# Patient Record
Sex: Male | Born: 1940 | Race: White | Hispanic: No | Smoking: Former smoker
Health system: Southern US, Community
[De-identification: ages and names within clinical notes are randomized; demographics above are authoritative.]

## PROBLEM LIST (undated history)

## (undated) DIAGNOSIS — F329 Major depressive disorder, single episode, unspecified: Secondary | ICD-10-CM

## (undated) DIAGNOSIS — I4891 Unspecified atrial fibrillation: Secondary | ICD-10-CM

## (undated) DIAGNOSIS — I219 Acute myocardial infarction, unspecified: Secondary | ICD-10-CM

## (undated) DIAGNOSIS — I10 Essential (primary) hypertension: Secondary | ICD-10-CM

## (undated) DIAGNOSIS — H269 Unspecified cataract: Secondary | ICD-10-CM

## (undated) DIAGNOSIS — F32A Depression, unspecified: Secondary | ICD-10-CM

## (undated) DIAGNOSIS — R7303 Prediabetes: Secondary | ICD-10-CM

## (undated) DIAGNOSIS — I2510 Atherosclerotic heart disease of native coronary artery without angina pectoris: Secondary | ICD-10-CM

## (undated) DIAGNOSIS — N400 Enlarged prostate without lower urinary tract symptoms: Secondary | ICD-10-CM

## (undated) DIAGNOSIS — G4733 Obstructive sleep apnea (adult) (pediatric): Secondary | ICD-10-CM

## (undated) DIAGNOSIS — E785 Hyperlipidemia, unspecified: Secondary | ICD-10-CM

## (undated) DIAGNOSIS — I639 Cerebral infarction, unspecified: Secondary | ICD-10-CM

## (undated) DIAGNOSIS — F419 Anxiety disorder, unspecified: Secondary | ICD-10-CM

## (undated) DIAGNOSIS — I619 Nontraumatic intracerebral hemorrhage, unspecified: Secondary | ICD-10-CM

## (undated) HISTORY — DX: Unspecified atrial fibrillation: I48.91

## (undated) HISTORY — DX: Obstructive sleep apnea (adult) (pediatric): G47.33

## (undated) HISTORY — DX: Acute myocardial infarction, unspecified: I21.9

## (undated) HISTORY — DX: Atherosclerotic heart disease of native coronary artery without angina pectoris: I25.10

## (undated) HISTORY — DX: Depression, unspecified: F32.A

## (undated) HISTORY — DX: Major depressive disorder, single episode, unspecified: F32.9

## (undated) HISTORY — DX: Essential (primary) hypertension: I10

## (undated) HISTORY — DX: Benign prostatic hyperplasia without lower urinary tract symptoms: N40.0

## (undated) HISTORY — DX: Hyperlipidemia, unspecified: E78.5

## (undated) HISTORY — DX: Anxiety disorder, unspecified: F41.9

---

## 2004-10-16 HISTORY — PX: CORONARY STENT PLACEMENT: SHX1402

## 2004-10-21 ENCOUNTER — Inpatient Hospital Stay (HOSPITAL_COMMUNITY): Admission: EM | Admit: 2004-10-21 | Discharge: 2004-10-25 | Payer: Self-pay | Admitting: Emergency Medicine

## 2004-10-21 ENCOUNTER — Encounter: Payer: Self-pay | Admitting: Emergency Medicine

## 2004-10-22 ENCOUNTER — Ambulatory Visit: Payer: Self-pay | Admitting: Cardiology

## 2004-11-10 ENCOUNTER — Ambulatory Visit: Payer: Self-pay | Admitting: *Deleted

## 2005-12-04 ENCOUNTER — Encounter (INDEPENDENT_AMBULATORY_CARE_PROVIDER_SITE_OTHER): Payer: Self-pay | Admitting: Internal Medicine

## 2005-12-04 ENCOUNTER — Ambulatory Visit: Payer: Self-pay | Admitting: *Deleted

## 2005-12-18 ENCOUNTER — Ambulatory Visit: Payer: Self-pay | Admitting: Cardiology

## 2006-01-08 ENCOUNTER — Ambulatory Visit: Payer: Self-pay | Admitting: Internal Medicine

## 2006-02-01 ENCOUNTER — Ambulatory Visit: Payer: Self-pay | Admitting: Internal Medicine

## 2006-05-30 ENCOUNTER — Encounter (INDEPENDENT_AMBULATORY_CARE_PROVIDER_SITE_OTHER): Payer: Self-pay | Admitting: Internal Medicine

## 2006-06-22 ENCOUNTER — Encounter: Payer: Self-pay | Admitting: Internal Medicine

## 2006-06-22 DIAGNOSIS — I2510 Atherosclerotic heart disease of native coronary artery without angina pectoris: Secondary | ICD-10-CM | POA: Insufficient documentation

## 2006-06-22 DIAGNOSIS — F329 Major depressive disorder, single episode, unspecified: Secondary | ICD-10-CM

## 2006-06-22 DIAGNOSIS — F411 Generalized anxiety disorder: Secondary | ICD-10-CM | POA: Insufficient documentation

## 2006-06-22 DIAGNOSIS — F32A Depression, unspecified: Secondary | ICD-10-CM | POA: Insufficient documentation

## 2006-06-22 DIAGNOSIS — N400 Enlarged prostate without lower urinary tract symptoms: Secondary | ICD-10-CM | POA: Insufficient documentation

## 2006-06-22 DIAGNOSIS — E785 Hyperlipidemia, unspecified: Secondary | ICD-10-CM | POA: Insufficient documentation

## 2006-06-22 DIAGNOSIS — I10 Essential (primary) hypertension: Secondary | ICD-10-CM | POA: Insufficient documentation

## 2006-08-27 ENCOUNTER — Encounter (INDEPENDENT_AMBULATORY_CARE_PROVIDER_SITE_OTHER): Payer: Self-pay | Admitting: Internal Medicine

## 2006-08-31 LAB — CONVERTED CEMR LAB
AST: 12 units/L (ref 0–37)
Alkaline Phosphatase: 81 units/L (ref 39–117)
BUN: 22 mg/dL (ref 6–23)
Calcium: 8.9 mg/dL (ref 8.4–10.5)
Chloride: 112 meq/L (ref 96–112)
Potassium: 4.4 meq/L (ref 3.5–5.3)
Sodium: 144 meq/L (ref 135–145)
TSH: 1.76 microintl units/mL (ref 0.350–5.50)
Total Bilirubin: 0.4 mg/dL (ref 0.3–1.2)
Total Protein: 6.5 g/dL (ref 6.0–8.3)
Triglycerides: 95 mg/dL (ref <150)
VLDL: 19 mg/dL (ref 0–40)

## 2006-09-01 ENCOUNTER — Ambulatory Visit: Payer: Self-pay | Admitting: Internal Medicine

## 2006-09-01 DIAGNOSIS — I252 Old myocardial infarction: Secondary | ICD-10-CM | POA: Insufficient documentation

## 2006-09-01 DIAGNOSIS — R5381 Other malaise: Secondary | ICD-10-CM | POA: Insufficient documentation

## 2006-09-01 DIAGNOSIS — R5383 Other fatigue: Secondary | ICD-10-CM

## 2006-09-02 ENCOUNTER — Ambulatory Visit: Payer: Self-pay | Admitting: Internal Medicine

## 2006-09-14 ENCOUNTER — Encounter (INDEPENDENT_AMBULATORY_CARE_PROVIDER_SITE_OTHER): Payer: Self-pay | Admitting: Internal Medicine

## 2006-09-16 ENCOUNTER — Encounter (INDEPENDENT_AMBULATORY_CARE_PROVIDER_SITE_OTHER): Payer: Self-pay | Admitting: Internal Medicine

## 2006-09-22 ENCOUNTER — Ambulatory Visit: Payer: Self-pay | Admitting: Internal Medicine

## 2006-09-22 DIAGNOSIS — G4733 Obstructive sleep apnea (adult) (pediatric): Secondary | ICD-10-CM | POA: Insufficient documentation

## 2006-10-27 ENCOUNTER — Encounter (INDEPENDENT_AMBULATORY_CARE_PROVIDER_SITE_OTHER): Payer: Self-pay | Admitting: Internal Medicine

## 2007-02-07 ENCOUNTER — Encounter (INDEPENDENT_AMBULATORY_CARE_PROVIDER_SITE_OTHER): Payer: Self-pay | Admitting: Internal Medicine

## 2007-03-17 ENCOUNTER — Ambulatory Visit: Payer: Self-pay | Admitting: Internal Medicine

## 2007-03-17 DIAGNOSIS — R635 Abnormal weight gain: Secondary | ICD-10-CM | POA: Insufficient documentation

## 2007-03-17 DIAGNOSIS — D485 Neoplasm of uncertain behavior of skin: Secondary | ICD-10-CM | POA: Insufficient documentation

## 2007-03-17 LAB — CONVERTED CEMR LAB: OCCULT 1: NEGATIVE

## 2007-03-31 ENCOUNTER — Ambulatory Visit: Payer: Self-pay | Admitting: Internal Medicine

## 2007-04-06 ENCOUNTER — Telehealth (INDEPENDENT_AMBULATORY_CARE_PROVIDER_SITE_OTHER): Payer: Self-pay | Admitting: Internal Medicine

## 2007-04-06 ENCOUNTER — Encounter (INDEPENDENT_AMBULATORY_CARE_PROVIDER_SITE_OTHER): Payer: Self-pay | Admitting: Internal Medicine

## 2007-05-09 ENCOUNTER — Encounter (INDEPENDENT_AMBULATORY_CARE_PROVIDER_SITE_OTHER): Payer: Self-pay | Admitting: Internal Medicine

## 2007-05-10 ENCOUNTER — Encounter (INDEPENDENT_AMBULATORY_CARE_PROVIDER_SITE_OTHER): Payer: Self-pay | Admitting: Internal Medicine

## 2007-05-11 ENCOUNTER — Encounter (INDEPENDENT_AMBULATORY_CARE_PROVIDER_SITE_OTHER): Payer: Self-pay | Admitting: Internal Medicine

## 2007-05-11 ENCOUNTER — Telehealth (INDEPENDENT_AMBULATORY_CARE_PROVIDER_SITE_OTHER): Payer: Self-pay | Admitting: *Deleted

## 2007-05-11 LAB — CONVERTED CEMR LAB
AST: 7 units/L (ref 0–37)
Albumin: 4.3 g/dL (ref 3.5–5.2)
Alkaline Phosphatase: 79 units/L (ref 39–117)
Creatinine, Ser: 1.32 mg/dL (ref 0.40–1.50)
HDL: 36 mg/dL — ABNORMAL LOW (ref >39)
PSA: 1.86 ng/mL (ref 0.10–4.00)
TSH: 2.732 microintl units/mL (ref 0.350–5.50)
Triglycerides: 166 mg/dL — ABNORMAL HIGH (ref <150)
VLDL: 33 mg/dL (ref 0–40)

## 2007-06-15 ENCOUNTER — Encounter (INDEPENDENT_AMBULATORY_CARE_PROVIDER_SITE_OTHER): Payer: Self-pay | Admitting: Internal Medicine

## 2007-07-05 ENCOUNTER — Encounter (INDEPENDENT_AMBULATORY_CARE_PROVIDER_SITE_OTHER): Payer: Self-pay | Admitting: Internal Medicine

## 2007-08-12 ENCOUNTER — Encounter (INDEPENDENT_AMBULATORY_CARE_PROVIDER_SITE_OTHER): Payer: Self-pay | Admitting: Internal Medicine

## 2007-08-22 ENCOUNTER — Ambulatory Visit: Payer: Self-pay | Admitting: Internal Medicine

## 2007-08-22 DIAGNOSIS — R1011 Right upper quadrant pain: Secondary | ICD-10-CM | POA: Insufficient documentation

## 2007-08-22 LAB — CONVERTED CEMR LAB
Blood in Urine, dipstick: NEGATIVE
Glucose, Urine, Semiquant: NEGATIVE
Ketones, urine, test strip: NEGATIVE
Specific Gravity, Urine: 1.02

## 2007-08-23 LAB — CONVERTED CEMR LAB
ALT: 20 units/L (ref 0–53)
AST: 17 units/L (ref 0–37)
Albumin: 4.5 g/dL (ref 3.5–5.2)
Basophils Absolute: 0 10*3/uL (ref 0.0–0.1)
CO2: 22 meq/L (ref 19–32)
Calcium: 9.1 mg/dL (ref 8.4–10.5)
Creatinine, Ser: 1.26 mg/dL (ref 0.40–1.50)
Eosinophils Absolute: 0.3 10*3/uL (ref 0.0–0.7)
HCT: 47 % (ref 39.0–52.0)
HDL: 29 mg/dL — ABNORMAL LOW (ref >39)
Hemoglobin: 15.6 g/dL (ref 13.0–17.0)
Lymphocytes Relative: 41 % (ref 12–46)
MCHC: 33.2 g/dL (ref 30.0–36.0)
MCV: 90.9 fL (ref 78.0–100.0)
RDW: 14.4 % (ref 11.5–15.5)
Sodium: 141 meq/L (ref 135–145)
Total Bilirubin: 0.4 mg/dL (ref 0.3–1.2)
Triglycerides: 187 mg/dL — ABNORMAL HIGH (ref <150)
VLDL: 37 mg/dL (ref 0–40)
WBC: 7.9 10*3/uL (ref 4.0–10.5)

## 2007-10-12 ENCOUNTER — Ambulatory Visit: Payer: Self-pay | Admitting: Internal Medicine

## 2007-10-13 ENCOUNTER — Encounter (INDEPENDENT_AMBULATORY_CARE_PROVIDER_SITE_OTHER): Payer: Self-pay | Admitting: Internal Medicine

## 2007-11-11 ENCOUNTER — Encounter (INDEPENDENT_AMBULATORY_CARE_PROVIDER_SITE_OTHER): Payer: Self-pay | Admitting: Internal Medicine

## 2007-11-11 ENCOUNTER — Ambulatory Visit: Admission: RE | Admit: 2007-11-11 | Discharge: 2007-11-11 | Payer: Self-pay | Admitting: Internal Medicine

## 2007-11-22 ENCOUNTER — Telehealth (INDEPENDENT_AMBULATORY_CARE_PROVIDER_SITE_OTHER): Payer: Self-pay | Admitting: *Deleted

## 2007-11-30 ENCOUNTER — Ambulatory Visit: Payer: Self-pay | Admitting: Pulmonary Disease

## 2007-12-05 ENCOUNTER — Encounter (INDEPENDENT_AMBULATORY_CARE_PROVIDER_SITE_OTHER): Payer: Self-pay | Admitting: Internal Medicine

## 2007-12-09 ENCOUNTER — Encounter (INDEPENDENT_AMBULATORY_CARE_PROVIDER_SITE_OTHER): Payer: Self-pay | Admitting: Internal Medicine

## 2007-12-14 ENCOUNTER — Encounter (INDEPENDENT_AMBULATORY_CARE_PROVIDER_SITE_OTHER): Payer: Self-pay | Admitting: Internal Medicine

## 2008-03-09 ENCOUNTER — Ambulatory Visit: Payer: Self-pay | Admitting: Internal Medicine

## 2008-03-09 DIAGNOSIS — H5789 Other specified disorders of eye and adnexa: Secondary | ICD-10-CM | POA: Insufficient documentation

## 2008-04-10 ENCOUNTER — Ambulatory Visit: Payer: Self-pay | Admitting: Internal Medicine

## 2008-04-10 DIAGNOSIS — F17200 Nicotine dependence, unspecified, uncomplicated: Secondary | ICD-10-CM | POA: Insufficient documentation

## 2008-04-17 ENCOUNTER — Encounter (INDEPENDENT_AMBULATORY_CARE_PROVIDER_SITE_OTHER): Payer: Self-pay | Admitting: Internal Medicine

## 2008-04-19 LAB — CONVERTED CEMR LAB
ALT: 12 units/L (ref 0–53)
Calcium: 9.2 mg/dL (ref 8.4–10.5)
Chloride: 108 meq/L (ref 96–112)
Cholesterol: 146 mg/dL (ref 0–200)
Creatinine, Ser: 1.23 mg/dL (ref 0.40–1.50)
HDL: 34 mg/dL — ABNORMAL LOW (ref >39)
Sodium: 141 meq/L (ref 135–145)
Total Bilirubin: 0.6 mg/dL (ref 0.3–1.2)
Total CHOL/HDL Ratio: 4.3
Triglycerides: 186 mg/dL — ABNORMAL HIGH (ref <150)
VLDL: 37 mg/dL (ref 0–40)

## 2008-05-03 ENCOUNTER — Encounter (INDEPENDENT_AMBULATORY_CARE_PROVIDER_SITE_OTHER): Payer: Self-pay | Admitting: Internal Medicine

## 2008-06-05 ENCOUNTER — Encounter (INDEPENDENT_AMBULATORY_CARE_PROVIDER_SITE_OTHER): Payer: Self-pay | Admitting: Internal Medicine

## 2008-12-31 ENCOUNTER — Ambulatory Visit: Payer: Self-pay | Admitting: Internal Medicine

## 2008-12-31 DIAGNOSIS — M722 Plantar fascial fibromatosis: Secondary | ICD-10-CM | POA: Insufficient documentation

## 2009-01-01 LAB — CONVERTED CEMR LAB
ALT: 17 units/L (ref 0–53)
Albumin: 4.3 g/dL (ref 3.5–5.2)
BUN: 20 mg/dL (ref 6–23)
Calcium: 8.7 mg/dL (ref 8.4–10.5)
Creatinine, Ser: 1.15 mg/dL (ref 0.40–1.50)
Glucose, Bld: 106 mg/dL — ABNORMAL HIGH (ref 70–99)
HDL: 29 mg/dL — ABNORMAL LOW (ref >39)
VLDL: 63 mg/dL — ABNORMAL HIGH (ref 0–40)

## 2009-02-06 ENCOUNTER — Encounter (INDEPENDENT_AMBULATORY_CARE_PROVIDER_SITE_OTHER): Payer: Self-pay | Admitting: Internal Medicine

## 2009-08-23 ENCOUNTER — Ambulatory Visit: Payer: Self-pay | Admitting: Cardiovascular Disease

## 2009-08-23 DIAGNOSIS — I4891 Unspecified atrial fibrillation: Secondary | ICD-10-CM | POA: Insufficient documentation

## 2010-04-24 ENCOUNTER — Encounter (INDEPENDENT_AMBULATORY_CARE_PROVIDER_SITE_OTHER): Payer: Self-pay | Admitting: *Deleted

## 2010-06-08 ENCOUNTER — Encounter: Payer: Self-pay | Admitting: Cardiovascular Disease

## 2010-06-17 NOTE — Assessment & Plan Note (Signed)
Summary: **AFIB   Visit Type:  Initial Consult Primary Provider:  Dr.Tom [name]  CC:  AFIB.  History of Present Illness: Eugene [name] is a new patient to me.  He has had a previous stent to the PDA ostium in 2006.  He last saw Dr Dorethea Clan in 2007.  He has CRF's of elevated cholesterol HTN and ongoing tobacco abuse.  He was counseled for less than 10 minutes in regard to smoking cessaton.  He is not motivated to quit and smokes about a pack/day.  He had an exam with Dr Rubin Payor and was noted to be in asymptoatic afib.  He denies palpitations, SSCP, syncope or edema.  He has mild exertional dyspnea from emphysema.  Dr Rubin Payor stopped his Plavix and started on him on coumadin.  I spoke to the patient at length about the diagnosis.  Since he is asymptomatic I think rate control and anticoagulation is warrented.  He has known CAD with ongoing smoking and Plavix has been stopped.  I think an exercise myovue is in order to test the adequacy of rate control and R/O recurrent ischemia or CAD.  We will also check an echo to measure atrial size and make sure LV funciton is still normal.  I did discuss Pradaxa with the patient but we both were a little leary of the fact that it cannot be reversed.  Current Problems (verified): 1)  Atrial Fibrillation  (ICD-427.31) 2)  Plantar Fasciitis, Left  (ICD-728.71) 3)  Tobacco Abuse  (ICD-305.1) 4)  Redness or Discharge of Eye  (ICD-379.93) 5)  Ruq Pain  (ICD-789.01) 6)  Encounter For Long-term Use of Other Medications  (ICD-V58.69) 7)  Screening For Malignant Neoplasm, Colon  (ICD-V76.51) 8)  Neoplasm, Skin, Uncertain Behavior  (ICD-238.2) 9)  Screening For Malignant Neoplasm, Prostate  (ICD-V76.44) 10)  Weight Gain  (ICD-783.1) 11)  Sleep Apnea, Obstructive  (ICD-327.23) 12)  Fatigue  (ICD-780.79) 13)  Myocardial Infarction, Hx of  (ICD-412) 14)  Benign Prostatic Hypertrophy  (ICD-600.00) 15)  Hypertension  (ICD-401.9) 16)  Hyperlipidemia  (ICD-272.4) 17)   Depression  (ICD-311) 18)  Coronary Artery Disease  (ICD-414.00) 19)  Anxiety  (ICD-300.00)  Current Medications (verified): 1)  Metoprolol Tartrate 25 Mg Tabs (Metoprolol Tartrate) .... 1/2 By Mouth Two Times A Day 2)  Aspirin 81 Mg Tbec (Aspirin) .Marland Kitchen.. 1 By Mouth Once Daily 3)  Amlodipine Besylate 10 Mg Tabs (Amlodipine Besylate) .Marland Kitchen.. 1 By Mouth Once Daily 4)  Paxil 20 Mg Tabs (Paroxetine Hcl) .... Once Daily 5)  Simvastatin 40 Mg  Tabs (Simvastatin) .Marland Kitchen.. 1 By Mouth Once Daily 6)  Fish Oil Concentrate 1000 Mg Caps (Omega-3 Fatty Acids) .Marland Kitchen.. 1 By Mouth Two Times A Day 7)  Benazepril Hcl 40 Mg Tabs (Benazepril Hcl) .Marland Kitchen.. 1 By Mouth Once Daily  Allergies (verified): No Known Drug Allergies  Past History:  Past Medical History: Last updated: 09/22/2006 Anxiety Coronary artery disease Depression Hyperlipidemia Hypertension Benign prostatic hypertrophy Myocardial infarction, hx of-6/06-normal LV function OSA  Past Surgical History: Last updated: 2006/09/16 cardiac stent-6/06  Family History: Last updated: 2006-09-16 father-deceased mother-deceased sister-70 brother-74 brother-72 son-44-OSA daughter-43  Social History: Last updated: 08/23/2009 Divorced lives alone Current Smoker-1 ppd x 50 years Alcohol use-no Drug use-no Retired Insurance claims handler for [employer]  Social History: Divorced lives alone Current Smoker-1 ppd x 50 years Alcohol use-no Drug use-no Retired Insurance claims handler for [employer]  Review of Systems       Denies fever, malais, weight loss, blurry vision, decreased visual acuity, cough,  sputum, SOB, hemoptysis, pleuritic pain, palpitaitons, heartburn, abdominal pain, melena, lower extremity edema, claudication, or rash. All other systems reviewed and negative  Vital Signs:  Patient profile:   70 year old male Height:      73 inches Weight:      209 pounds BMI:     27.67 Pulse rate:   65 / minute BP sitting:   124 / 77  (right arm)  Vitals Entered  By: Dreama Saa, CNA (August 23, 2009 10:08 AM)  Physical Exam  General:  Affect appropriate Healthy:  appears stated age HEENT: normal Neck supple with no adenopathy JVP normal no bruits no thyromegaly Lungs end exp  wheezing and good diaphragmatic motion Heart:  S1/S2 no murmur,rub, gallop or click PMI normal Abdomen: benighn, BS positve, no tenderness, no AAA no bruit.  No HSM or HJR Distal pulses intact with no bruits No edema Neuro non-focal Skin warm and dry    Impression & Recommendations:  Problem # 1:  ATRIAL FIBRILLATION (ICD-427.31)  Rate control good, INR followed in [place] Echo to assess atrial size and EF The following medications were removed from the medication list:    Plavix 75 Mg Tabs (Clopidogrel bisulfate) ..... Once daily His updated medication list for this problem includes:    Metoprolol Tartrate 25 Mg Tabs (Metoprolol tartrate) .Marland Kitchen... 1/2 by mouth two times a day    Aspirin 81 Mg Tbec (Aspirin) .Marland Kitchen... 1 by mouth once daily  Orders: Nuclear Stress Test (Nuc Stress Test) 2-D Echocardiogram (2D Echo)  The following medications were removed from the medication list:    Plavix 75 Mg Tabs (Clopidogrel bisulfate) ..... Once daily His updated medication list for this problem includes:    Metoprolol Tartrate 25 Mg Tabs (Metoprolol tartrate) .Marland Kitchen... 1/2 by mouth two times a day    Aspirin 81 Mg Tbec (Aspirin) .Marland Kitchen... 1 by mouth once daily  Problem # 2:  TOBACCO ABUSE (ICD-305.1) Will call if he wants a script for Chantix  Problem # 3:  MYOCARDIAL INFARCTION, HX OF (ICD-412)  CAD with history of PDA stent.  Myovue The following medications were removed from the medication list:    Plavix 75 Mg Tabs (Clopidogrel bisulfate) ..... Once daily His updated medication list for this problem includes:    Metoprolol Tartrate 25 Mg Tabs (Metoprolol tartrate) .Marland Kitchen... 1/2 by mouth two times a day    Aspirin 81 Mg Tbec (Aspirin) .Marland Kitchen... 1 by mouth once daily     Amlodipine Besylate 10 Mg Tabs (Amlodipine besylate) .Marland Kitchen... 1 by mouth once daily    Benazepril Hcl 40 Mg Tabs (Benazepril hcl) .Marland Kitchen... 1 by mouth once daily  The following medications were removed from the medication list:    Plavix 75 Mg Tabs (Clopidogrel bisulfate) ..... Once daily His updated medication list for this problem includes:    Metoprolol Tartrate 25 Mg Tabs (Metoprolol tartrate) .Marland Kitchen... 1/2 by mouth two times a day    Aspirin 81 Mg Tbec (Aspirin) .Marland Kitchen... 1 by mouth once daily    Amlodipine Besylate 10 Mg Tabs (Amlodipine besylate) .Marland Kitchen... 1 by mouth once daily    Benazepril Hcl 40 Mg Tabs (Benazepril hcl) .Marland Kitchen... 1 by mouth once daily  Problem # 4:  HYPERTENSION (ICD-401.9)  Well controlled His updated medication list for this problem includes:    Metoprolol Tartrate 25 Mg Tabs (Metoprolol tartrate) .Marland Kitchen... 1/2 by mouth two times a day    Aspirin 81 Mg Tbec (Aspirin) .Marland Kitchen... 1 by mouth once daily  Amlodipine Besylate 10 Mg Tabs (Amlodipine besylate) .Marland Kitchen... 1 by mouth once daily    Benazepril Hcl 40 Mg Tabs (Benazepril hcl) .Marland Kitchen... 1 by mouth once daily  His updated medication list for this problem includes:    Metoprolol Tartrate 25 Mg Tabs (Metoprolol tartrate) .Marland Kitchen... 1/2 by mouth two times a day    Aspirin 81 Mg Tbec (Aspirin) .Marland Kitchen... 1 by mouth once daily    Amlodipine Besylate 10 Mg Tabs (Amlodipine besylate) .Marland Kitchen... 1 by mouth once daily    Benazepril Hcl 40 Mg Tabs (Benazepril hcl) .Marland Kitchen... 1 by mouth once daily  Problem # 5:  HYPERLIPIDEMIA (ICD-272.4)  Continue Statin His updated medication list for this problem includes:    Simvastatin 40 Mg Tabs (Simvastatin) .Marland Kitchen... 1 by mouth once daily  CHOL: 119 (12/31/2008)   LDL: 27 (12/31/2008)   HDL: 29 (12/31/2008)   TG: 316 (12/31/2008)  His updated medication list for this problem includes:    Simvastatin 40 Mg Tabs (Simvastatin) .Marland Kitchen... 1 by mouth once daily  Patient Instructions: 1)  Your physician recommends that you schedule a  follow-up appointment in: 6 months 2)  Your physician recommends that you continue on your current medications as directed. Please refer to the Current Medication list given to you today. 3)  Your physician has requested that you have an echocardiogram.  Echocardiography is a painless test that uses sound waves to create images of your heart. It provides your doctor with information about the size and shape of your heart and how well your heart's chambers and valves are working.  This procedure takes approximately one hour. There are no restrictions for this procedure. 4)  Your physician has requested that you have an exercise stress myoview.  For further information please visit [email or web address]  Please follow instruction sheet, as given.   EKG Report  Procedure date:  08/23/2009  Findings:      AFib 52 LAD PVC Poor R wave progression

## 2010-06-17 NOTE — Letter (Signed)
Summary: [place] [facility] (Nuc Med Stress)  [facility]  [address], [place] [address]   Phone: [phone]  Fax: [phone]    Nuclear Medicine 1-Day Stress Test Information Sheet  Re:     Eugene [name]   DOB:     [birth year] MRN:     [id number] Weight:  Appointment Date: Register at: Appointment Time: Referring MD:  _X__Exercise Stress  __Adenosine   __Dobutamine  __Lexiscan  __Persantine   __Thallium  Urgency: ____1 (next day)   ____2 (one week)    ____3 (PRN)  Patient will receive Follow Up call with results: Patient needs follow-up appointment:  Instructions regarding medication:  How to prepare for your stress test: 1. DO NOT eat or dring 6 hours prior to your arrival time. This includes no caffeine (coffee, tea, sodas, chocolate) if you were instructed to take your medications, drink water with it. 2. DO NOT use any tobacco products for at leaset 8 hours prior to arrival. 3. DO NOT wear dresses or any clothing that may have metal clasps or buttons. 4. Wear short sleeve shirts, loose clothing, and comfortalbe walking shoes. 5. DO NOT use lotions, oils or powder on your chest before the test. 6. The test will take approximately 3-4 hours from the time you arrive until completion. 7. To register the day of the test, go to the Short Stay entrance at [facility]. 8. If you must cancel your test, call [phone] as soon as you are aware. 9. Do not take Metoprolol dose the morning of test. After you arrive for test:   When you arrive at [facility], you will go to Short Stay to be registered. They will then send you to Radiology to check in. The Nuclear Medicine Tech will get you and start an IV in your arm or hand. A small amount of a radioactive tracer will then be injected into your IV. This tracer will then have to circulate for 30-45 minutes. During this time you will wait in the waiting room and you will be able to drink  something without caffeine. A series of pictures will be taken of your heart follwoing this waiting period. After the 1st set of pictures you will go to the stress lab to get ready for your stress test. During the stress test, another small amount of a radioactive tracer will be injected through your IV. When the stress test is complete, there is a short rest period while your heart rate and blood pressure will be monitored. When this monitoring period is complete you will have another set of pictrues taken. (The same as the 1st set of pictures). These pictures are taken between 15 minutes and 1 hour after the stress test. The time depends on the type of stress test you had. Your doctor will inform you of your test results within 7 days after test.    The possibilities of certain changes are possible during the test. They include abnormal blood pressure and disorders of the heart. Side effects of persantine or adenosine can include flushing, chest pain, shortness of breath, stomach tightness, headache and light-headedness. These side effects usually do not last long and are self-resolving. Every effort will be made to keep you comfortable and to minimize complications by obtaining a medical history and by close observation during the test. Emergency equipment, medications, and trained personnel are available to deal with any unusual situation which may arise.  Please notify office at least 48  hours in advance if you are unable to keep this appt.

## 2010-06-17 NOTE — Letter (Signed)
Summary: Appointment - Reminder 2  [facility] [place]. [address] [place] [address]   Phone: [phone]  Fax: [phone]     April 24, 2010 MRN: [id number]   [facility] [address] [place], [place]  [address]   Dear Eugene [name],  Our records indicate that it is time to schedule a follow-up appointment.  Dr.   Eden Emms       recommended that you follow up with [place] in   02/2010 PAST DUE         . It is very important that we reach you to schedule this appointment. We look forward to participating in your health care needs. Please contact us at the number listed above at your earliest convenience to schedule your appointment.  If you are unable to make an appointment at this time, give [place] a call so we can update our records.     Sincerely,   Glass blower/designer

## 2010-09-30 NOTE — Procedures (Signed)
Eugene [name], Eugene [name]           ACCOUNT NO.:  [id number]   MEDICAL RECORD NO.:  [id number]          PATIENT TYPE:  OUT   LOCATION:  SLEEP LAB                     FACILITY:  [facility]   PHYSICIAN:  Barbaraann Share, MD,FCCPDATE OF BIRTH:  Jan 04[birth year] 1939   DATE OF STUDY:  11/11/2007                            NOCTURNAL POLYSOMNOGRAM   REFERRING PHYSICIAN:  Erle Crocker, M.D.   REFERRING PHYSICIAN:  Dr. Erle Crocker.   INDICATIONS FOR THE STUDY:  Is hypersomnia with sleep apnea.   EPWORTH SCORE:  13.   SLEEP ARCHITECTURE:  The patient had a total sleep time of 125 minutes  during the diagnostic portion of the study and 262 minutes during the  titration portion.  He was found to have very little slow wave sleep and  REM during the entire night.  Sleep onset latency was normal at 27  minutes, and sleep efficiency was mild to moderately reduced overall.   RESPIRATORY DATA:  The patient underwent split-night study where he was  found to have 45 obstructive events in the first 125 minutes of sleep.  This gave him an AHI of 22 events per hour during the diagnostic portion  of the study.  Events occurred in all body positions, and there was very  loud snoring noted throughout.  By protocol, the patient was then placed  on a large ComfortGel full face mask and ultimately titrated to a final  pressure of 14 cm of water.  The patient was well-controlled on this  pressure until the very last REM period, and then he was found to have  some breakthrough events.   OXYGEN DATA:  The patient had O2 desaturation as low as 91% with his  obstructive events.   CARDIAC DATA:  No clinically significant arrhythmias were noted.   MOVEMENT/PARASOMNIA:  There were no significant leg jerks or abnormal  behaviors.   IMPRESSION/RECOMMENDATIONS:  Split-night study reveals moderate  obstructive sleep apnea with an AHI during the diagnostic portion of 22  events per hour and O2 desaturation as low as 91%.  The  patient was then  placed  on CPAP with a large ComfortGel full face mask, and ultimately titrated  to a final pressure of 14 cm.  There was a few breakthrough events  during the last REM period, but overall the patient appeared to have  adequate control.      Barbaraann Share, MD,FCCP  Diplomate, American Board of Sleep  Medicine  Electronically Signed     [facility]/MEDQ  D:  11/30/2007 20:21:13  T:  11/30/2007 20:59:20  Job:  [id number]

## 2010-10-03 NOTE — Assessment & Plan Note (Signed)
[facility]                         [facility] OFFICE NOTE   NAME:Eugene [name], Eugene [name]                    MRN:          [id number]  DATE:12/04/2005                            DOB:          [birth year]    Primary used to be Dr. Sudie Bailey.  He does not have one now.   Eugene [name] is a man we follow for his coronary disease, status post a  Cypher stent in his PDA.  He has nonobstructive disease in other coronary  distributions.  Normal LV systolic function.  Hypertension, hyperlipidemia,  and a history of tobacco abuse.  He comes in to see me today for routine  followup.  He is hypertensive.  He says his blood pressure has been out of  control for a while.  No other symptoms.  No other problems.   PHYSICAL EXAMINATION:  VITAL SIGNS:  He is 185 pounds.  His blood pressure  is 180/100.  His pulse is 74.  CHEST:  Clear.  NECK:  He has no jugular venous distention or carotid bruits.  CARDIOVASCULAR:  Regular.  There is an S4 but no murmur.  EXTREMITIES:  Lower extremities without edema.   MEDICATIONS:  1.  Aspirin 325 once a day.  2.  Plavix 75 once a day.  3.  Lopressor 12.5 mg twice a day.  4.  Lipitor 20 once a day.  5.  Enalapril 5 twice a day.   My plan is to increase his enalapril to 10 twice a day to see how he does.  We will see him back in two weeks for a blood pressure check.  He is going  to need to have his lipids checked and will get that set up for him as a  fasting set of lipids.  We will see him back in six months for routine  followup.                                   Farris Has. Dorethea Clan, MD   J[facility]/MedQ  DD:  12/04/2005  DT:  12/04/2005  Job #:  [id number]

## 2010-10-03 NOTE — Discharge Summary (Signed)
Eugene [name], Eugene [name] NO.:  [id number]   MEDICAL RECORD NO.:  [id number]           PATIENT TYPE:   LOCATION:  3711                         FACILITY:  [facility]   PHYSICIAN:  Eugene [name], M.D. LHCDATE OF BIRTH:  [birth year]   DATE OF ADMISSION:  10/21/2004  DATE OF DISCHARGE:  10/25/2004                                 DISCHARGE SUMMARY   PRIMARY CARE PHYSICIAN:  Eugene Homer. Sudie Bailey, MD, in [place], [place]  [place].   CARDIOLOGIST:  Eugene Roller, MD.   DISCHARGING DIAGNOSES:  1.  Non-ST elevated myocardial infarction, status post drug-eluting stent to      the posterior descending artery on October 22, 2004.  Medical therapy      planned otherwise.  Left ventricular ejection around 55%.  2.  Hyperlipidemia, statin initiated.  3.  Hypertension.  4.  History of tobacco abuse.   DISPOSITION:   DISCHARGE MEDICATIONS:  1.  Aspirin 325 mg daily.  2.  Plavix 75 mg daily.  3.  Vasotec 5 mg p.o. b.i.d.  4.  Lopressor 12.5 mg p.o. b.i.d.  5.  Lipitor 20 mg at bedtime.  6.  Nitroglycerin for chest discomfort.   ACTIVITY:  No driving or lifting over 10 pounds x1 week.   DIET:  Low-fat, low-salt, low-cholesterol.   SPECIAL INSTRUCTIONS:  1.  No tub bathing x2 days.  2.  Patient is highly encouraged on cessation of smoking.  3.  He is to call our office at [phone] for any fever or for any pain or      swelling from cath site.   FOLLOWUP:  He has a followup appointment with Dr. Dorethea [name] in two weeks.  He  will need lipids and LFT checked in six to eight weeks with initiation of  statin.   HOSPITAL COURSE:  Mr. Eugene [name] is a 70 year old gentleman initially seen  at [facility] for chest pain and transported to [facility] for further  workup.  EKG with ST depression in anterolateral leads, a troponin of 9.98.  Patient to the cath lab.  Results as stated above.  Patient tolerated the  procedure without complication.  Post-cath, the patient developed an  episode  of chest pain, and EKG done without changes.  Discomfort relieved with  nitroglycerin and morphine and resolved.  Patient without any further chest  discomfort, up ambulating with cardiac rehab.  Drs.  Eugene [name] and Eugene [name] in to see patient on day of discharge.  Vital signs  stable.  Hemoglobin 13.8, BUN and creatinine 19 and 1.1.  Patient being  discharged home to follow up and will follow up with Dr. Dorethea [name] as stated  above.      Mic   MB/MEDQ  D:  10/25/2004  T:  10/25/2004  Job:  [id number]   cc:   Eugene [name], M.D.  [address] [place], [place] [address]  Fax: [phone]

## 2010-10-03 NOTE — H&P (Signed)
NAMETERESO, UNANGST NO.:  [id number]   MEDICAL RECORD NO.:  [id number]          PATIENT TYPE:  INP   LOCATION:  1825                         FACILITY:  [facility]   PHYSICIAN:  Vida Roller, M.D.   DATE OF BIRTH:  July 26, 1940   DATE OF ADMISSION:  10/21/2004  DATE OF DISCHARGE:                                HISTORY & PHYSICAL   PRIMARY CARE PHYSICIAN:  Mila Homer. Sudie Bailey, M.D.   HISTORY OF PRESENT ILLNESS:  Mr. Eugene [name] is a 70 year old man with a past  medical history of hypertension, hyperlipidemia and active tobacco abuse who  presents to the emergency department and [facility] after an  episode of chest pain which started at 8:30 this morning. He states that the  discomforts in the center of his chest, radiates to his left arm associated  with some shortness of breath, occurred at rest, has no exertional component  to it. Pain waxed and waned. He had an initial electrocardiogram that showed  ST-segment depression in anterolateral leads. His initial troponins were  abnormal and so he was treated with IV heparin, nitroglycerin, and aspirin  and sent to [facility]. His discomfort had resolved on IV  nitroglycerin, but now recurs here in the ER at [facility], but is significantly better with a single sublingual nitroglycerin  and has nearly resolved.   PAST MEDICAL HISTORY:  1.  Significant for hypertension. He is on Vaseretic at an unknown dose.  2.  He also has hyperlipidemia which is untreated.  3.  He has a history of cataracts.  4.  He is also an active smoker has about 75 to 80+ pack-year smoking      history. Continues smoke about a pack a day. Denies illicit drugs or      alcohol.   FAMILY HISTORY:  His mother and father both passed away at an advanced  stage; both of them complications of hypertension. Neither one had coronary  artery disease. She has siblings without coronary disease. He has had two  children who have no coronary disease and several grandchildren.   PAST SURGICAL HISTORY:  Significant only for the cataracts and had cataract  surgery number of years ago without complications.   REVIEW OF SYSTEMS:  Denies any headaches, visual changes, sinus tenderness,  sinus discharge, changes in hearing, odynophagia, dysphasia, shortness of  breath, wheezing, cough productive of sputum. He denies any abdominal pain,  melena, hematochezia, hematemesis. Denies any PND, orthopnea, or lower  extremity edema. Denies any musculoskeletal problems. Denies any neurologic  problems. Denies any weight loss or weight gain. No heat or cold  intolerances. He denies any skin rashes. The remainder of his review of  systems is negative.   PHYSICAL EXAMINATION:  GENERAL:  He is a well-developed, well-nourished  white male in no apparent distress who is alert and oriented x4.  VITAL SIGNS:  His heart rate 55 and regular, blood pressure of 140/60,  respirations were 18. He is afebrile.  HEENT:  Examination of the head, ears, eyes, nose and throat unremarkable.  NECK:  Neck is supple. There  is no jugular venous distension or carotid  bruits.  CHEST:  Clear to auscultation bilaterally.  CARDIOVASCULAR:  His cardiac exam is regular. He does have an S4. There is  no obvious murmurs. First and second heart sounds are normal.  ABDOMEN:  His abdomen is soft, nontender. Normoactive bowel sounds.  EXTREMITIES:  Lower extremities without clubbing, cyanosis or edema. Pulses  were all 2+.  GENITOURINARY:  Deferred.  RECTAL:  Deferred.  BREASTS:  Deferred.  NEUROLOGICAL:  Nonfocal.  MUSCULOSKELETAL:  Nonfocal.   LABORATORY DATA:  His electrocardiogram shows sinus rhythm at a rate of 65  with normal intervals and normal axes. He has about 1/2 mm ST-segment  depression in V4, V5 and V6 with biphasic T-waves in V5 and V6. I have no  old EKGs for comparison.   White blood cell count 8.7, H&H of 16 and 47,  platelet count of 242,000.  Sodium 137, potassium 3.9, chloride 104, bicarbonate 27, BUN 18, creatinine  1.1, blood glucose of 140. His liver function studies are within normal  limits. Initial set of cardiac enzymes point care shows CK-MB of 2.7 which  is within normal range but a troponin of 0.6. Myoglobin was normal at 80.  Second set show a myoglobin of 99, CK-MB of 2.9. His troponin 0.07. He had  no other laboratories.   ASSESSMENT:  So my assessment is a gentleman with unstable angina. His chest  pain is concerning to me. We will treat this relatively aggressively with IV  heparin, nitroglycerin and I am going to add Plavix to his aspirin. If he  continues to have difficulty was his discomfort in his chest, I may attempt  to give him some intravenous beta blockade. His hypertension appears to be  reasonably well controlled currently on reasonable medications. I will  continue his Vasotec, stop his diuretic component to his medications. For  his hyperlipidemia, we will check that with fasting lipids and will start  him on Zocor. He does have a shellfish allergy and so will premedicate him  prior to having heart catheterization in the morning. I will get smoking  cessation consult.       JH/MEDQ  D:  10/21/2004  T:  10/21/2004  Job:  [id number]   cc:   Mila Homer. Sudie Bailey, M.D.  [address] [place], [place] [address]  Fax: [phone]

## 2010-10-03 NOTE — Cardiovascular Report (Signed)
Eugene [name], BORDONARO           ACCOUNT NO.:  [id number]   MEDICAL RECORD NO.:  [id number]          PATIENT TYPE:  INP   LOCATION:  2930                         FACILITY:  [facility]   PHYSICIAN:  Salvadore Farber, M.D. LHCDATE OF BIRTH:  1940/08/03   DATE OF PROCEDURE:  10/22/2004  DATE OF DISCHARGE:                              CARDIAC CATHETERIZATION   PROCEDURES PERFORMED:  1.  Left heart catheterization.  2.  Left ventriculography.  3.  Coronary angiography.  4.  Drug-eluting stent placement in the left posterior descending artery.  5.  Starclose closure of the right common femoral arteriotomy site.   CARDIOLOGIST:  Salvadore Farber, M.D.   INDICATIONS:  Mr. Jentz is a 70 year old gentleman without prior  history of cardiovascular disease.  He presented with non-ST segment  elevation myocardial infarction yesterday, ruled in with a peak CK/MB  approaching 100.  Electrocardiogram demonstrated only minor non-specific ST-  T abnormalities.  He has not had any recurrence of his chest discomfort  since hospitalization.  He has been treated with aspirin, heparin, Plavix,  beta blocker, and Statin.  He is referred for diagnostic angiography with an  eye to percutaneous revascularization.   PROCEDURAL TECHNIQUE:  Informed consent was obtained.  The patient underwent  lidocaine local anesthesia.  A 5 French sheath was placed in the right  common femoral artery using the modified Seldinger technique.  Diagnostic  angiography and ventriculography were  performed using JL-4, JL-4 and  pigtail catheters.  This demonstrated the culprit lesion to be a 95%  stenosis of the left PDA.  Decision was made to proceed to percutaneous  revascularization.   The sheath was upsized over a wire to a 6 [place].  Anticoagulation was  augmented with additional heparin to achieve and maintain an ACT of greater  than 200 seconds.  A double bolus of eptifibatide was administered.  A 6  [place] Voda  left 4 guide was advanced over a wire and engaged in the ostium  of the left main.  ProWater wire was advanced to the distal left PDA without  difficulty.  I attempted to directly stent the vessel, but was unable to  pass the stent beyond the stenosis.  I therefore predilated using a 2.25 x 9  mm Maverick at eight atmospheres.  I was then able to easily pass the 2.5 x  13 mm Cypher.  It was deployed at 16 atmospheres.  The stent was then  postdilated using a 2.5 x 12 mm Quantum at 20 atmospheres.  Final  angiography demonstrated no residual stenosis, TIMI III flow to the distal  vasculature and no dissection.   The patient tolerated the procedure well and was transferred to the holding  room in stable condition.   COMPLICATIONS:  None.   FINDINGS:   HEMODYNAMIC DATA:  LV 134/10/19.  EF 65% without regional wall motion  abnormality.  No aortic stenosis or mitral regurgitation.   ANGIOGRAPHIC DATA:  Left Main:  The left main is angiographically normal.   LAD:  The LAD is a moderate-sized vessel giving rise to a single large  diagonal.  The proximal  vessel is heavily calcified.  There are diffuse  luminal irregularities throughout the length of the vessel; however, there  are no significant stenosis.   Ramus Intermedius:  The ramus intermedius is a relatively small vessel with  an ostial 60% stenosis extending to the proximal third of the vessel.   Circumflex:  The circumflex is a very large dominant vessel.  It gives rise  to two large posterior left ventricular branches, a small obtuse marginal  and a left PDA.  The AV groove circumflex has serial 40 and 50% stenoses  distally.  The left PDA had 95% stenosis in its proximal subportion.  This  was stented t no residual.   RCA:  The RCA is a small nondominant vessel with 70% stenosis in the  midsection.   IMPRESSION AND RECOMMENDATIONS:  Successful drug-eluting stent placement in  the proximal portion of the left posterior  descending artery.   The patient should be continued on Plavix for the minimum of a year.  Aspirin will be continued indefinitely.  Smoking cessation was strongly  advised.       WED/MEDQ  D:  10/22/2004  T:  10/23/2004  Job:  [id number]   cc:   Mila Homer. Sudie Bailey, M.D.  [address] [place], [place] [address]  Fax: [phone]   Vida Roller, M.D.  Fax: [phone]

## 2011-05-14 ENCOUNTER — Encounter: Payer: Self-pay | Admitting: Cardiology

## 2011-08-11 ENCOUNTER — Other Ambulatory Visit: Payer: Self-pay | Admitting: Family Medicine

## 2011-08-11 DIAGNOSIS — R1011 Right upper quadrant pain: Secondary | ICD-10-CM

## 2011-08-14 ENCOUNTER — Ambulatory Visit
Admission: RE | Admit: 2011-08-14 | Discharge: 2011-08-14 | Disposition: A | Discharge status: Home / Self Care | Payer: Medicare Other | Source: Ambulatory Visit | Attending: Family Medicine | Admitting: Family Medicine

## 2011-08-14 DIAGNOSIS — R1011 Right upper quadrant pain: Secondary | ICD-10-CM

## 2012-10-24 ENCOUNTER — Telehealth: Payer: Self-pay | Admitting: Family Medicine

## 2012-10-25 MED ORDER — METOPROLOL TARTRATE 25 MG PO TABS: 12.5000 mg | ORAL_TABLET | Freq: Two times a day (BID) | ORAL | Status: DC

## 2012-10-25 NOTE — Telephone Encounter (Signed)
Rx Refilled  

## 2012-12-02 ENCOUNTER — Telehealth: Payer: Self-pay | Admitting: Family Medicine

## 2012-12-02 MED ORDER — PAROXETINE HCL 20 MG PO TABS: 20.0000 mg | ORAL_TABLET | ORAL | Status: DC

## 2012-12-02 NOTE — Telephone Encounter (Signed)
Medication refilled per protocol. 

## 2013-03-21 ENCOUNTER — Ambulatory Visit (INDEPENDENT_AMBULATORY_CARE_PROVIDER_SITE_OTHER): Payer: Medicare Other | Admitting: Family Medicine

## 2013-03-21 DIAGNOSIS — Z23 Encounter for immunization: Secondary | ICD-10-CM

## 2013-05-16 ENCOUNTER — Ambulatory Visit (INDEPENDENT_AMBULATORY_CARE_PROVIDER_SITE_OTHER): Payer: Medicare Other | Admitting: Family Medicine

## 2013-05-16 ENCOUNTER — Encounter: Payer: Self-pay | Admitting: Family Medicine

## 2013-05-16 VITALS — BP 126/72 | HR 62 | Temp 97.7°F | Resp 14 | Ht 72.0 in | Wt 198.0 lb

## 2013-05-16 DIAGNOSIS — F17200 Nicotine dependence, unspecified, uncomplicated: Secondary | ICD-10-CM

## 2013-05-16 DIAGNOSIS — I10 Essential (primary) hypertension: Secondary | ICD-10-CM

## 2013-05-16 DIAGNOSIS — N400 Enlarged prostate without lower urinary tract symptoms: Secondary | ICD-10-CM

## 2013-05-16 DIAGNOSIS — E785 Hyperlipidemia, unspecified: Secondary | ICD-10-CM

## 2013-05-16 DIAGNOSIS — Z23 Encounter for immunization: Secondary | ICD-10-CM

## 2013-05-16 MED ORDER — PAROXETINE HCL 20 MG PO TABS: 20.0000 mg | ORAL_TABLET | ORAL | Status: DC

## 2013-05-16 MED ORDER — SILODOSIN 8 MG PO CAPS: 8.0000 mg | ORAL_CAPSULE | Freq: Every day | ORAL | Status: DC

## 2013-05-16 NOTE — Addendum Note (Signed)
Addended by: Legrand Rams B on: 2020-02-2913 03:23 PM   Modules accepted: Orders

## 2013-05-16 NOTE — Progress Notes (Signed)
Subjective:    Patient ID: Eugene [name], male    DOB: 01/26/1941, 72 y.o.   MRN: [id number]  HPI Patient is here today requesting a refill on his Paxil. That is the only reason that he made this appointment. He has not been seen in almost a year. His past medical history of hypertension, hyperlipidemia, atrial fibrillation, COPD, tobacco abuse. He continues to smoke. He has no plans to quit smoking. He refuses anticoagulations for his age fibrillation. He denies any chest pain, shortness of breath, dyspnea on exertion. He is overdue for lab work. He also reports increased urinary frequency. He has to breathe every 30 minutes to an hour. He provides very little. He has nocturia 5 or 6 times per night. He also has occasional urge incontinence. He also reports a weak stream. He denies any hematuria or dysuria. Past Medical History  Diagnosis Date  . Hyperlipidemia   . Hypertension   . Coronary artery disease   . Anxiety   . Depressed   . BPH (benign prostatic hypertrophy)   . MI (myocardial infarction)   . OSA (obstructive sleep apnea)   . Atrial fibrillation    Past Surgical History  Procedure Laterality Date  . Coronary stent placement  10/2004   Current Outpatient Prescriptions on File Prior to Visit  Medication Sig Dispense Refill  . amLODipine (NORVASC) 10 MG tablet Take 10 mg by mouth daily.        . benazepril (LOTENSIN) 40 MG tablet Take 40 mg by mouth daily.        . metoprolol tartrate (LOPRESSOR) 25 MG tablet Take 0.5 tablets (12.5 mg total) by mouth 2 (two) times daily.  90 tablet  3  . simvastatin (ZOCOR) 40 MG tablet Take 40 mg by mouth at bedtime.        Marland Kitchen aspirin 81 MG tablet Take 160 mg by mouth daily.        . fish oil-omega-3 fatty acids 1000 MG capsule Take 1 capsule by mouth 2 (two) times daily.         No current facility-administered medications on file prior to visit.   No Known Allergies History   Social History  . Marital Status: Divorced   Spouse Name: N/A    Number of Children: N/A  . Years of Education: N/A   Occupational History  . Retired Radio producer for [employer]    Social History Main Topics  . Smoking status: Current Every Day Smoker -- 1.00 packs/day for 50 years  . Smokeless tobacco: Not on file  . Alcohol Use: No  . Drug Use: No  . Sexual Activity: Not on file   Other Topics Concern  . Not on file   Social History Narrative  . No narrative on file      Review of Systems  All other systems reviewed and are negative.       Objective:   Physical Exam  Vitals reviewed. Neck: Neck supple. No JVD present.  Cardiovascular: Normal rate and normal heart sounds.   No murmur heard. Pulmonary/Chest: Effort normal and breath sounds normal. No respiratory distress. He has no wheezes. He has no rales. He exhibits no tenderness.  Abdominal: Soft. Bowel sounds are normal. He exhibits no distension. There is no tenderness. There is no rebound and no guarding.  Lymphadenopathy:    He has no cervical adenopathy.          Assessment & Plan:  1. BPH (benign prostatic hyperplasia) Patient refuses  a digital rectal exam. He will return for a PSA. Meanwhile I will treat the patient empirically as BPH with rapiaflo 8 mg by mouth daily - silodosin (RAPAFLO) 8 MG CAPS capsule; Take 1 capsule (8 mg total) by mouth daily with breakfast.  Dispense: 90 capsule; Refill: 3 - PSA, Medicare; Future  2. HYPERTENSION Blood pressure is currently well controlled. Continue current medications at the present dosages. - COMPLETE METABOLIC PANEL WITH GFR; Future  3. HYPERLIPIDEMIA Return for fasting lipid panel. LDL is less than 70. - COMPLETE METABOLIC PANEL WITH GFR; Future - Lipid panel; Future  4. TOBACCO ABUSE Recommended smoking cessation. Patient is unwilling to quit smoking at the present time. He does consent to Prevnar 13 and received that today in clinic.

## 2013-08-09 ENCOUNTER — Other Ambulatory Visit: Payer: Self-pay | Admitting: Family Medicine

## 2013-08-09 MED ORDER — AMLODIPINE BESYLATE 10 MG PO TABS: 10.0000 mg | ORAL_TABLET | Freq: Every day | ORAL | Status: DC

## 2013-08-09 NOTE — Telephone Encounter (Signed)
Rx Refilled  

## 2013-12-08 ENCOUNTER — Encounter: Payer: Self-pay | Admitting: Family Medicine

## 2013-12-08 ENCOUNTER — Telehealth: Payer: Self-pay | Admitting: Family Medicine

## 2013-12-08 MED ORDER — METOPROLOL TARTRATE 25 MG PO TABS: 12.5000 mg | ORAL_TABLET | Freq: Two times a day (BID) | ORAL | Status: DC

## 2013-12-08 NOTE — Telephone Encounter (Signed)
Medication refill for one time only.  Patient needs to be seen.  Letter sent for patient to call and schedule 

## 2013-12-11 ENCOUNTER — Other Ambulatory Visit: Payer: Self-pay | Admitting: Family Medicine

## 2013-12-14 ENCOUNTER — Telehealth: Payer: Self-pay | Admitting: Family Medicine

## 2013-12-14 MED ORDER — DABIGATRAN ETEXILATE MESYLATE 150 MG PO CAPS: 150.0000 mg | ORAL_CAPSULE | Freq: Two times a day (BID) | ORAL | Status: DC

## 2013-12-14 NOTE — Telephone Encounter (Signed)
PATIENT IS SAYING THAT MAIL ORDER [facility] IS SENDING [place] REFILL REQUESTS FOR PRADZXA AND HE HAS NOT HEARD ANYTHING BACK FROM ABOUT ABOUT THIS NOR THE PHARMACY  [phone]

## 2013-12-14 NOTE — Telephone Encounter (Signed)
Called pt and he states that he has been on Pradaxa for several years now and that Dr. Dennard Schaumann put him on it but there is no documentation of that in his EPIC chart. Med sent to pharm.

## 2013-12-25 ENCOUNTER — Encounter: Payer: Self-pay | Admitting: Family Medicine

## 2013-12-25 ENCOUNTER — Ambulatory Visit (INDEPENDENT_AMBULATORY_CARE_PROVIDER_SITE_OTHER): Payer: Medicare Other | Admitting: Family Medicine

## 2013-12-25 VITALS — BP 118/80 | HR 62 | Temp 97.6°F | Resp 18 | Ht 68.5 in | Wt 191.5 lb

## 2013-12-25 DIAGNOSIS — E785 Hyperlipidemia, unspecified: Secondary | ICD-10-CM

## 2013-12-25 DIAGNOSIS — I10 Essential (primary) hypertension: Secondary | ICD-10-CM

## 2013-12-25 DIAGNOSIS — I482 Chronic atrial fibrillation, unspecified: Secondary | ICD-10-CM

## 2013-12-25 DIAGNOSIS — N400 Enlarged prostate without lower urinary tract symptoms: Secondary | ICD-10-CM

## 2013-12-25 DIAGNOSIS — I4891 Unspecified atrial fibrillation: Secondary | ICD-10-CM

## 2013-12-25 LAB — CBC WITH DIFFERENTIAL/PLATELET
Basophils Absolute: 0 10*3/uL (ref 0.0–0.1)
Basophils Relative: 0 % (ref 0–1)
EOS PCT: 3 % (ref 0–5)
Eosinophils Absolute: 0.3 10*3/uL (ref 0.0–0.7)
HEMATOCRIT: 47.1 % (ref 39.0–52.0)
Hemoglobin: 16.4 g/dL (ref 13.0–17.0)
LYMPHS ABS: 2.6 10*3/uL (ref 0.7–4.0)
LYMPHS PCT: 31 % (ref 12–46)
MCH: 30.7 pg (ref 26.0–34.0)
MCHC: 34.8 g/dL (ref 30.0–36.0)
MCV: 88 fL (ref 78.0–100.0)
Monocytes Absolute: 0.9 10*3/uL (ref 0.1–1.0)
Monocytes Relative: 10 % (ref 3–12)
NEUTROS ABS: 4.8 10*3/uL (ref 1.7–7.7)
Neutrophils Relative %: 56 % (ref 43–77)
PLATELETS: 199 10*3/uL (ref 150–400)
RBC: 5.35 MIL/uL (ref 4.22–5.81)
RDW: 16 % — ABNORMAL HIGH (ref 11.5–15.5)
WBC: 8.5 10*3/uL (ref 4.0–10.5)

## 2013-12-25 LAB — COMPLETE METABOLIC PANEL WITH GFR
ALBUMIN: 4 g/dL (ref 3.5–5.2)
ALT: 9 U/L (ref 0–53)
AST: 11 U/L (ref 0–37)
Alkaline Phosphatase: 77 U/L (ref 39–117)
BUN: 16 mg/dL (ref 6–23)
CO2: 26 meq/L (ref 19–32)
CREATININE: 1.04 mg/dL (ref 0.50–1.35)
Calcium: 9.1 mg/dL (ref 8.4–10.5)
Chloride: 105 mEq/L (ref 96–112)
GFR, EST AFRICAN AMERICAN: 81 mL/min
GFR, Est Non African American: 70 mL/min
Glucose, Bld: 99 mg/dL (ref 70–99)
Potassium: 4.8 mEq/L (ref 3.5–5.3)
Sodium: 140 mEq/L (ref 135–145)
TOTAL PROTEIN: 6.5 g/dL (ref 6.0–8.3)
Total Bilirubin: 0.6 mg/dL (ref 0.2–1.2)

## 2013-12-25 LAB — LIPID PANEL
Cholesterol: 165 mg/dL (ref 0–200)
HDL: 40 mg/dL (ref >39)
LDL CALC: 95 mg/dL (ref 0–99)
TRIGLYCERIDES: 149 mg/dL (ref <150)
Total CHOL/HDL Ratio: 4.1 Ratio
VLDL: 30 mg/dL (ref 0–40)

## 2013-12-25 MED ORDER — FINASTERIDE 5 MG PO TABS: 5.0000 mg | ORAL_TABLET | Freq: Every day | ORAL | Status: DC

## 2013-12-25 NOTE — Progress Notes (Signed)
Subjective:    Patient ID: Eugene [name], male    DOB: [birth year], 73 y.o.   MRN: [id number]  HPI Patient is a 73 year old white male who is here today for regular followup. He has history of chronic atrial fibrillation. He is appropriately anticoagulated on pradaxa.  He denies any irregular bleeding or bruising. He also takes an aspirin 81 mg by mouth daily for his history of coronary artery disease. Unfortunately he continues to smoke. His blood pressures well controlled at 118/80. He denies any chest pain. He denies any angina. He denies any myalgias or right upper quadrant pain on simvastatin. Unfortunately rapaflo did not help the patient's symptoms of BPH. He again denies prostate exam/digital rectal exam. However based on symptoms including nocturia 3-4 times a night, urinary stream, I believe the patient has BPH. Patient's heart rate is well controlled on labetalol. He denies any palpitations or syncope. Past Medical History  Diagnosis Date  . Hyperlipidemia   . Hypertension   . Coronary artery disease   . Anxiety   . Depressed   . BPH (benign prostatic hypertrophy)   . MI (myocardial infarction)   . OSA (obstructive sleep apnea)   . Atrial fibrillation    Past Surgical History  Procedure Laterality Date  . Coronary stent placement  10/2004   Current Outpatient Prescriptions on File Prior to Visit  Medication Sig Dispense Refill  . amLODipine (NORVASC) 10 MG tablet Take 1 tablet (10 mg total) by mouth daily.  90 tablet  4  . aspirin 81 MG tablet Take 160 mg by mouth daily.        . dabigatran (PRADAXA) 150 MG CAPS capsule Take 1 capsule (150 mg total) by mouth 2 (two) times daily.  180 capsule  3  . fish oil-omega-3 fatty acids 1000 MG capsule Take 1 capsule by mouth 2 (two) times daily.        . metoprolol tartrate (LOPRESSOR) 25 MG tablet Take 0.5 tablets (12.5 mg total) by mouth 2 (two) times daily.  90 tablet  0  . PARoxetine (PAXIL) 20 MG tablet Take 1 tablet (20 mg  total) by mouth every morning.  90 tablet  3  . silodosin (RAPAFLO) 8 MG CAPS capsule Take 1 capsule (8 mg total) by mouth daily with breakfast.  90 capsule  3  . benazepril (LOTENSIN) 40 MG tablet Take 40 mg by mouth daily.        . simvastatin (ZOCOR) 40 MG tablet Take 40 mg by mouth at bedtime.         No current facility-administered medications on file prior to visit.   No Known Allergies History   Social History  . Marital Status: Divorced    Spouse Name: N/A    Number of Children: N/A  . Years of Education: N/A   Occupational History  . Retired Civil engineer, contracting for [employer] History Main Topics  . Smoking status: Current Every Day Smoker -- 1.00 packs/day for 50 years  . Smokeless tobacco: Not on file  . Alcohol Use: No  . Drug Use: No  . Sexual Activity: Not on file   Other Topics Concern  . Not on file   Social History Narrative  . No narrative on file      Review of Systems  All other systems reviewed and are negative.      Objective:   Physical Exam  Vitals reviewed. Neck: Neck supple. No JVD present. No thyromegaly present.  Cardiovascular: Normal rate and normal heart sounds.  An irregularly irregular rhythm present.  No murmur heard. Pulmonary/Chest: Effort normal. He has wheezes.  Abdominal: Soft. Bowel sounds are normal. He exhibits no distension. There is no tenderness. There is no rebound and no guarding.  Musculoskeletal: He exhibits no edema.  Lymphadenopathy:    He has no cervical adenopathy.          Assessment & Plan:  1. BPH (benign prostatic hyperplasia) Patient symptoms are consistent with BPH. He refuses rectal exam. Therefore I will check a PSA. I will have the patient discontinue rapaflo.  Begin finasteride 5 mg poqday and recheck PSA in 3 months. - PSA, Medicare - finasteride (PROSCAR) 5 MG tablet; Take 1 tablet (5 mg total) by mouth daily.  Dispense: 90 tablet; Refill: 1  2. Unspecified essential  hypertension Patient's blood pressures well controlled. - CBC with Differential - COMPLETE METABOLIC PANEL WITH GFR - Lipid panel  3. Other and unspecified hyperlipidemia Check fasting lipid panel. LDL cholesterol is less than 70 given history of coronary artery disease - COMPLETE METABOLIC PANEL WITH GFR - Lipid panel  4. Chronic atrial fibrillation Patient's heart rate is well controlled. Patient is appropriately anticoagulated. I will check a CBC. I again encouraged smoking cessation.

## 2013-12-26 LAB — PSA, MEDICARE: PSA: 3.74 ng/mL (ref <=4.00)

## 2014-01-03 ENCOUNTER — Encounter: Payer: Self-pay | Admitting: *Deleted

## 2014-01-08 ENCOUNTER — Encounter: Payer: Self-pay | Admitting: *Deleted

## 2014-01-08 ENCOUNTER — Other Ambulatory Visit: Payer: Self-pay | Admitting: *Deleted

## 2014-01-08 MED ORDER — ROSUVASTATIN CALCIUM 20 MG PO TABS
20.0000 mg | ORAL_TABLET | Freq: Every day | ORAL | Status: DC
Start: 2014-01-08 — End: 2015-01-17

## 2014-04-17 ENCOUNTER — Other Ambulatory Visit: Payer: Self-pay | Admitting: Family Medicine

## 2014-04-25 ENCOUNTER — Ambulatory Visit (INDEPENDENT_AMBULATORY_CARE_PROVIDER_SITE_OTHER): Payer: Medicare Other | Admitting: *Deleted

## 2014-04-25 DIAGNOSIS — Z23 Encounter for immunization: Secondary | ICD-10-CM

## 2014-06-15 ENCOUNTER — Other Ambulatory Visit: Payer: Self-pay | Admitting: Family Medicine

## 2014-06-15 ENCOUNTER — Encounter: Payer: Self-pay | Admitting: Family Medicine

## 2014-06-15 NOTE — Telephone Encounter (Signed)
Medication refill for one time only.  Patient needs to be seen.  Letter sent for patient to call and schedule 

## 2014-07-03 ENCOUNTER — Ambulatory Visit (INDEPENDENT_AMBULATORY_CARE_PROVIDER_SITE_OTHER): Payer: Medicare Other | Admitting: Family Medicine

## 2014-07-03 ENCOUNTER — Encounter: Payer: Self-pay | Admitting: Family Medicine

## 2014-07-03 VITALS — BP 114/76 | HR 64 | Temp 98.3°F | Resp 18 | Wt 204.0 lb

## 2014-07-03 DIAGNOSIS — I482 Chronic atrial fibrillation, unspecified: Secondary | ICD-10-CM

## 2014-07-03 DIAGNOSIS — E785 Hyperlipidemia, unspecified: Secondary | ICD-10-CM

## 2014-07-03 DIAGNOSIS — N400 Enlarged prostate without lower urinary tract symptoms: Secondary | ICD-10-CM

## 2014-07-03 DIAGNOSIS — I10 Essential (primary) hypertension: Secondary | ICD-10-CM

## 2014-07-03 LAB — CBC WITH DIFFERENTIAL/PLATELET
Basophils Absolute: 0 10*3/uL (ref 0.0–0.1)
Basophils Relative: 0 % (ref 0–1)
EOS ABS: 0.3 10*3/uL (ref 0.0–0.7)
EOS PCT: 3 % (ref 0–5)
HEMATOCRIT: 47.3 % (ref 39.0–52.0)
Hemoglobin: 16 g/dL (ref 13.0–17.0)
LYMPHS ABS: 2.9 10*3/uL (ref 0.7–4.0)
Lymphocytes Relative: 34 % (ref 12–46)
MCH: 30 pg (ref 26.0–34.0)
MCHC: 33.8 g/dL (ref 30.0–36.0)
MCV: 88.7 fL (ref 78.0–100.0)
MONOS PCT: 11 % (ref 3–12)
MPV: 10.1 fL (ref 8.6–12.4)
Monocytes Absolute: 0.9 10*3/uL (ref 0.1–1.0)
Neutro Abs: 4.4 10*3/uL (ref 1.7–7.7)
Neutrophils Relative %: 52 % (ref 43–77)
Platelets: 174 10*3/uL (ref 150–400)
RBC: 5.33 MIL/uL (ref 4.22–5.81)
RDW: 14.7 % (ref 11.5–15.5)
WBC: 8.5 10*3/uL (ref 4.0–10.5)

## 2014-07-03 LAB — COMPLETE METABOLIC PANEL WITH GFR
ALT: 22 U/L (ref 0–53)
AST: 13 U/L (ref 0–37)
Albumin: 3.8 g/dL (ref 3.5–5.2)
Alkaline Phosphatase: 73 U/L (ref 39–117)
BUN: 18 mg/dL (ref 6–23)
CALCIUM: 9.1 mg/dL (ref 8.4–10.5)
CHLORIDE: 105 meq/L (ref 96–112)
CO2: 27 mEq/L (ref 19–32)
Creat: 1.08 mg/dL (ref 0.50–1.35)
GFR, Est African American: 77 mL/min
GFR, Est Non African American: 67 mL/min
GLUCOSE: 96 mg/dL (ref 70–99)
POTASSIUM: 4.8 meq/L (ref 3.5–5.3)
Sodium: 141 mEq/L (ref 135–145)
Total Bilirubin: 0.6 mg/dL (ref 0.2–1.2)
Total Protein: 6.5 g/dL (ref 6.0–8.3)

## 2014-07-03 LAB — LIPID PANEL
CHOL/HDL RATIO: 2.7 ratio
CHOLESTEROL: 106 mg/dL (ref 0–200)
HDL: 40 mg/dL (ref >39)
LDL Cholesterol: 42 mg/dL (ref 0–99)
Triglycerides: 122 mg/dL (ref <150)
VLDL: 24 mg/dL (ref 0–40)

## 2014-07-03 NOTE — Progress Notes (Signed)
Subjective:    Patient ID: Eugene [name], male    DOB: 1940/05/29, 74 y.o.   MRN: [id number]  HPI Patient is here today for her regular checkup. He has a history of chronic atrial fibrillation. He is currently rate controlled and appropriately anticoagulated on pullback soda. He denies any bleeding or bruising. Please see my last office visit. At that time the patient was having nocturia that was unrelieved with Rapaflo. The patient was switched finasteride. Since starting finasteride, his polyuria and nocturia has improved slightly. He denies any urge incontinence. He denies any hematuria or dysuria. Unfortunately the patient continues to smoke. He denies any chest pain although he does have some mild shortness of breath. He is audibly wheezing today on examination. Patient appears to have COPD just based on his exam. At the present time he is not interested in taking Symbicort or Spiriva. He is also not prepared to quit smoking. His blood pressures well controlled today 114/76. He is also due to check a fasting lipid panel. He denies any myalgias or right upper quadrant pain. His only concern is some pain in his lower back and in his right hip. However he denies any injury. He denies any falls. The pain is not severe. He refuses to get an x-ray because the pain is not severe. Past Medical History  Diagnosis Date  . Hyperlipidemia   . Hypertension   . Coronary artery disease   . Anxiety   . Depressed   . BPH (benign prostatic hypertrophy)   . MI (myocardial infarction)   . OSA (obstructive sleep apnea)   . Atrial fibrillation    Past Surgical History  Procedure Laterality Date  . Coronary stent placement  10/2004   Current Outpatient Prescriptions on File Prior to Visit  Medication Sig Dispense Refill  . amLODipine (NORVASC) 10 MG tablet Take 1 tablet (10 mg total) by mouth daily. 90 tablet 4  . aspirin 81 MG tablet Take 160 mg by mouth daily.      . benazepril (LOTENSIN) 40 MG  tablet Take 40 mg by mouth daily.      . dabigatran (PRADAXA) 150 MG CAPS capsule Take 1 capsule (150 mg total) by mouth 2 (two) times daily. 180 capsule 3  . finasteride (PROSCAR) 5 MG tablet Take 1 tablet (5 mg total) by mouth daily. 90 tablet 1  . fish oil-omega-3 fatty acids 1000 MG capsule Take 1 capsule by mouth 2 (two) times daily.      . metoprolol tartrate (LOPRESSOR) 25 MG tablet Take 0.5 tablets (12.5 mg total) by mouth 2 (two) times daily. 30 tablet 0  . PARoxetine (PAXIL) 20 MG tablet Take 1 tablet (20 mg total) by mouth every morning. 90 tablet 3  . rosuvastatin (CRESTOR) 20 MG tablet Take 1 tablet (20 mg total) by mouth daily. 90 tablet 3  . silodosin (RAPAFLO) 8 MG CAPS capsule Take 1 capsule (8 mg total) by mouth daily with breakfast. 90 capsule 3   No current facility-administered medications on file prior to visit.   No Known Allergies History   Social History  . Marital Status: Divorced    Spouse Name: N/A  . Number of Children: N/A  . Years of Education: N/A   Occupational History  . Retired Civil engineer, contracting for [employer] History Main Topics  . Smoking status: Current Every Day Smoker -- 1.00 packs/day for 50 years  . Smokeless tobacco: Not on file  . Alcohol Use: No  .  Drug Use: No  . Sexual Activity: Not on file   Other Topics Concern  . Not on file   Social History Narrative      Review of Systems  All other systems reviewed and are negative.      Objective:   Physical Exam  Constitutional: He is oriented to person, place, and time. He appears well-developed and well-nourished.  Neck: Neck supple. No JVD present. No thyromegaly present.  Cardiovascular: Normal rate.  An irregularly irregular rhythm present.  Pulmonary/Chest: Effort normal. No respiratory distress. He has wheezes. He has no rales.  Abdominal: Soft. Bowel sounds are normal. He exhibits no distension and no mass. There is no tenderness. There is no rebound and no guarding.    Musculoskeletal: He exhibits no edema.  Lymphadenopathy:    He has no cervical adenopathy.  Neurological: He is alert and oriented to person, place, and time. He has normal reflexes. He exhibits normal muscle tone.  Vitals reviewed.         Assessment & Plan:  Chronic atrial fibrillation - Plan: CBC with Differential/Platelet  BPH (benign prostatic hyperplasia) - Plan: PSA, Medicare  Benign essential HTN  HLD (hyperlipidemia) - Plan: COMPLETE METABOLIC PANEL WITH GFR, Lipid panel  Patients heart rate is currently controlled. He is appropriately anticoagulated with Pradaxa. No changes in his medications regarding atrial fibrillation. I will check a CBC to monitor for anemia. Regarding his BPH, I will check a PSA. I anticipate that his PSA that was 3.7 in August should be approximately 50% better on finasteride. If it is still rising, I would recommend a urology referral. Blood pressure is well controlled. No changes in his blood pressure medication at the present time. I will check a fasting lipid panel. Given his history of coronary artery disease, his goal LDL cholesterol is less than 70. I strongly recommended smoking cessation. Patient declines a long-acting bronchodilator at the present time. If he was interested I will try Spiriva although this may aggravate his prostate. Patient declines on x-ray of his hip in his lower back. He will call me if the pain worsens.

## 2014-07-04 LAB — PSA, MEDICARE: PSA: 1.48 ng/mL (ref <=4.00)

## 2014-07-06 ENCOUNTER — Encounter: Payer: Self-pay | Admitting: *Deleted

## 2014-08-20 ENCOUNTER — Other Ambulatory Visit: Payer: Self-pay | Admitting: Family Medicine

## 2014-08-21 NOTE — Telephone Encounter (Signed)
Medication refilled per protocol. 

## 2014-08-22 ENCOUNTER — Other Ambulatory Visit: Payer: Self-pay | Admitting: Family Medicine

## 2014-08-22 MED ORDER — PAROXETINE HCL 20 MG PO TABS: 20.0000 mg | ORAL_TABLET | Freq: Every day | ORAL | Status: DC

## 2014-08-22 MED ORDER — AMLODIPINE BESYLATE 10 MG PO TABS: ORAL_TABLET | ORAL | Status: DC

## 2014-08-22 MED ORDER — FINASTERIDE 5 MG PO TABS: ORAL_TABLET | ORAL | Status: DC

## 2014-08-22 NOTE — Telephone Encounter (Signed)
Med sent to pharm 

## 2014-11-14 ENCOUNTER — Other Ambulatory Visit: Payer: Self-pay | Admitting: Family Medicine

## 2014-12-25 ENCOUNTER — Encounter (HOSPITAL_COMMUNITY): Payer: Self-pay | Admitting: *Deleted

## 2014-12-25 ENCOUNTER — Emergency Department (HOSPITAL_COMMUNITY)
Admission: EM | Admit: 2014-12-25 | Discharge: 2014-12-25 | Disposition: A | Discharge status: Home / Self Care | Payer: Medicare Other | Attending: Emergency Medicine | Admitting: Emergency Medicine

## 2014-12-25 DIAGNOSIS — Z79899 Other long term (current) drug therapy: Secondary | ICD-10-CM | POA: Diagnosis not present

## 2014-12-25 DIAGNOSIS — I252 Old myocardial infarction: Secondary | ICD-10-CM | POA: Insufficient documentation

## 2014-12-25 DIAGNOSIS — IMO0002 Reserved for concepts with insufficient information to code with codable children: Secondary | ICD-10-CM

## 2014-12-25 DIAGNOSIS — E785 Hyperlipidemia, unspecified: Secondary | ICD-10-CM | POA: Insufficient documentation

## 2014-12-25 DIAGNOSIS — H269 Unspecified cataract: Secondary | ICD-10-CM | POA: Diagnosis not present

## 2014-12-25 DIAGNOSIS — Z8669 Personal history of other diseases of the nervous system and sense organs: Secondary | ICD-10-CM | POA: Insufficient documentation

## 2014-12-25 DIAGNOSIS — Y998 Other external cause status: Secondary | ICD-10-CM | POA: Diagnosis not present

## 2014-12-25 DIAGNOSIS — Z23 Encounter for immunization: Secondary | ICD-10-CM | POA: Diagnosis not present

## 2014-12-25 DIAGNOSIS — I10 Essential (primary) hypertension: Secondary | ICD-10-CM | POA: Diagnosis not present

## 2014-12-25 DIAGNOSIS — Z87438 Personal history of other diseases of male genital organs: Secondary | ICD-10-CM | POA: Diagnosis not present

## 2014-12-25 DIAGNOSIS — S51812A Laceration without foreign body of left forearm, initial encounter: Secondary | ICD-10-CM | POA: Diagnosis not present

## 2014-12-25 DIAGNOSIS — Y929 Unspecified place or not applicable: Secondary | ICD-10-CM | POA: Insufficient documentation

## 2014-12-25 DIAGNOSIS — Y9389 Activity, other specified: Secondary | ICD-10-CM | POA: Insufficient documentation

## 2014-12-25 DIAGNOSIS — F329 Major depressive disorder, single episode, unspecified: Secondary | ICD-10-CM | POA: Insufficient documentation

## 2014-12-25 DIAGNOSIS — Z7982 Long term (current) use of aspirin: Secondary | ICD-10-CM | POA: Diagnosis not present

## 2014-12-25 DIAGNOSIS — Y288XXA Contact with other sharp object, undetermined intent, initial encounter: Secondary | ICD-10-CM | POA: Diagnosis not present

## 2014-12-25 DIAGNOSIS — F419 Anxiety disorder, unspecified: Secondary | ICD-10-CM | POA: Diagnosis not present

## 2014-12-25 DIAGNOSIS — I2510 Atherosclerotic heart disease of native coronary artery without angina pectoris: Secondary | ICD-10-CM | POA: Diagnosis not present

## 2014-12-25 DIAGNOSIS — Z720 Tobacco use: Secondary | ICD-10-CM | POA: Insufficient documentation

## 2014-12-25 HISTORY — DX: Unspecified cataract: H26.9

## 2014-12-25 MED ORDER — TETANUS-DIPHTH-ACELL PERTUSSIS 5-2.5-18.5 LF-MCG/0.5 IM SUSP
0.5000 mL | Freq: Once | INTRAMUSCULAR | Status: AC
  Administered 2014-12-25: 0.5 mL via INTRAMUSCULAR
  Filled 2014-12-25: qty 0.5

## 2014-12-25 MED ORDER — CEPHALEXIN 500 MG PO CAPS: 500.0000 mg | ORAL_CAPSULE | Freq: Three times a day (TID) | ORAL | Status: DC

## 2014-12-25 MED ORDER — LIDOCAINE-EPINEPHRINE (PF) 1 %-1:200000 IJ SOLN
INTRAMUSCULAR | Status: AC
  Filled 2014-12-25: qty 10

## 2014-12-25 MED ORDER — POVIDONE-IODINE 10 % EX SOLN
CUTANEOUS | Status: DC
Start: 2014-12-25 — End: 2014-12-26
  Filled 2014-12-25: qty 118

## 2014-12-25 NOTE — ED Provider Notes (Signed)
CSN: [id number]     Arrival date & time 12/25/14  2051 History   First MD Initiated Contact with Patient 12/25/14 2134     Chief Complaint  Patient presents with  . Extremity Laceration     (Consider location/radiation/quality/duration/timing/severity/associated sxs/prior Treatment) HPI Comments: Pt cut arm with saw just pta Pain is constant Moderate Worse with palpation Associated with bleeding tdap not utd   The history is provided by the patient.    Past Medical History  Diagnosis Date  . Hyperlipidemia   . Hypertension   . Coronary artery disease   . Anxiety   . Depressed   . BPH (benign prostatic hypertrophy)   . MI (myocardial infarction)   . OSA (obstructive sleep apnea)   . Atrial fibrillation   . Cataracts, both eyes    Past Surgical History  Procedure Laterality Date  . Coronary stent placement  10/2004   Family History  Problem Relation Age of Onset  . Sleep apnea Son    Social History  Substance Use Topics  . Smoking status: Current Every Day Smoker -- 1.00 packs/day for 50 years  . Smokeless tobacco: None  . Alcohol Use: No    Review of Systems  Skin: Positive for wound.  Neurological: Negative for numbness.      Allergies  Review of patient's allergies indicates no known allergies.  Home Medications   Prior to Admission medications   Medication Sig Start Date End Date Taking? Authorizing Provider  aspirin 81 MG tablet Take 81 mg by mouth daily.    Yes Historical Provider, MD  dabigatran (PRADAXA) 150 MG CAPS capsule Take 1 capsule (150 mg total) by mouth 2 (two) times daily. 12/14/13  Yes Susy Frizzle, MD  amLODipine (NORVASC) 10 MG tablet TAKE 1 BY MOUTH DAILY 08/22/14   Susy Frizzle, MD  benazepril (LOTENSIN) 40 MG tablet Take 40 mg by mouth daily.      Historical Provider, MD  cephALEXin (KEFLEX) 500 MG capsule Take 1 capsule (500 mg total) by mouth 3 (three) times daily. 12/25/14   Nona Dell, PA-C  finasteride  (PROSCAR) 5 MG tablet TAKE 1 BY MOUTH DAILY 08/22/14   Susy Frizzle, MD  metoprolol tartrate (LOPRESSOR) 25 MG tablet Take 0.5 tablets (12.5 mg total) by mouth 2 (two) times daily. 11/15/14   Susy Frizzle, MD  PARoxetine (PAXIL) 20 MG tablet Take 1 tablet (20 mg total) by mouth daily. 08/22/14   Susy Frizzle, MD  rosuvastatin (CRESTOR) 20 MG tablet Take 1 tablet (20 mg total) by mouth daily. 01/08/14   Susy Frizzle, MD  silodosin (RAPAFLO) 8 MG CAPS capsule Take 1 capsule (8 mg total) by mouth daily with breakfast. 05/16/13   Susy Frizzle, MD   BP 161/92 mmHg  Pulse 81  Temp(Src) 99 F (37.2 C) (Oral)  Resp 18  Ht 6' (1.829 m)  Wt 195 lb (88.451 kg)  BMI 26.44 kg/m2  SpO2 99% Physical Exam  Constitutional: He appears well-developed and well-nourished. No distress.  HENT:  Head: Normocephalic.  Eyes: Conjunctivae are normal. No scleral icterus.  Cardiovascular: Normal rate and regular rhythm.   Pulmonary/Chest: Effort normal and breath sounds normal.  Musculoskeletal: Normal range of motion. He exhibits tenderness ( ttp over the laceration site). He exhibits no edema.  Neurological: He is alert. Coordination normal.  Sensation and motor intact  Skin: Skin is warm and dry. He is not diaphoretic.  Laceration located on L forearm volar  The Laceration is linear shaped The depth is muscle through fascia The length is 5 cm    ED Course  Procedures (including critical care time) Labs Review Labs Reviewed - No data to display  Imaging Review No results found.    MDM   Final diagnoses:  Laceration    LACERATION REPAIR Performed by: Johnna Acosta Authorized by: Johnna Acosta Consent: Verbal consent obtained. Risks and benefits: risks, benefits and alternatives were discussed Consent given by: patient Patient identity confirmed: provided demographic data Prepped and Draped in normal sterile fashion Wound explored  Laceration Location: L  foraerm  Laceration Length: 5cm  No Foreign Bodies seen or palpated  Anesthesia: local infiltration  Local anesthetic: lidocaine 1% with epinephrine  Anesthetic total: 8 ml  Irrigation method: syringe Amount of cleaning: standard  Skin closure: Deep - 3-0 vicryl, superficial 4-0 prolene  Number of sutures: 5  Technique: deep running and superficial horiz matress  Patient tolerance: Patient tolerated the procedure well with no immediate complications.  Tolerate, tdap given, abx proph, stable for d/c.  Filed Vitals:   12/25/14 2056  BP: 161/92  Pulse: 81  Temp: 99 F (37.2 C)  Resp: 18      Noemi Chapel, MD 12/26/14 1537

## 2014-12-25 NOTE — ED Notes (Addendum)
Pt was using a grinder to cut a piece of medal and he cut his left arm. Pt has a 1.5 in laceration to his left forearm. Bleeding controlled in triage. Pt is on a blood thinner.

## 2015-01-01 ENCOUNTER — Ambulatory Visit (INDEPENDENT_AMBULATORY_CARE_PROVIDER_SITE_OTHER): Payer: Medicare Other | Admitting: Family Medicine

## 2015-01-01 ENCOUNTER — Encounter: Payer: Self-pay | Admitting: Family Medicine

## 2015-01-01 VITALS — BP 110/74 | HR 58 | Temp 98.4°F | Resp 20 | Ht 72.0 in | Wt 200.0 lb

## 2015-01-01 DIAGNOSIS — Z4802 Encounter for removal of sutures: Secondary | ICD-10-CM

## 2015-01-01 DIAGNOSIS — I2510 Atherosclerotic heart disease of native coronary artery without angina pectoris: Secondary | ICD-10-CM

## 2015-01-01 NOTE — Progress Notes (Signed)
Subjective:    Patient ID: Eugene [name], male    DOB: 1940-07-14, 74 y.o.   MRN: [id number]  HPI  patient 74 year old white male here today for suture removal. One week ago, the patient suffered a laceration to his ventral forearm using an angle grinder. At the emergency room, the laceration was closed with 5 horizontal mattress sutures. Patient is here today for suture removal. The wound is clean dry and intact and appears well healed. There is no evidence of cellulitis. Patient also has a history of atrial fibrillation for which she is currently rate controlled with metoprolol and anticoagulated with pradaxa. He also has a history of coronary artery disease for which he takes an aspirin for secondary prevention of heart attack as well as Crestor. His goal LDL cholesterol is less than 70. He is due for fasting lipid panel Past Medical History  Diagnosis Date  . Hyperlipidemia   . Hypertension   . Coronary artery disease   . Anxiety   . Depressed   . BPH (benign prostatic hypertrophy)   . MI (myocardial infarction)   . OSA (obstructive sleep apnea)   . Atrial fibrillation   . Cataracts, both eyes    Past Surgical History  Procedure Laterality Date  . Coronary stent placement  10/2004   Current Outpatient Prescriptions on File Prior to Visit  Medication Sig Dispense Refill  . amLODipine (NORVASC) 10 MG tablet TAKE 1 BY MOUTH DAILY 90 tablet 3  . aspirin 81 MG tablet Take 81 mg by mouth daily.     . cephALEXin (KEFLEX) 500 MG capsule Take 1 capsule (500 mg total) by mouth 3 (three) times daily. 21 capsule 0  . dabigatran (PRADAXA) 150 MG CAPS capsule Take 1 capsule (150 mg total) by mouth 2 (two) times daily. 180 capsule 3  . finasteride (PROSCAR) 5 MG tablet TAKE 1 BY MOUTH DAILY 90 tablet 3  . metoprolol tartrate (LOPRESSOR) 25 MG tablet Take 0.5 tablets (12.5 mg total) by mouth 2 (two) times daily. 90 tablet 3  . PARoxetine (PAXIL) 20 MG tablet Take 1 tablet (20 mg total) by  mouth daily. 90 tablet 3  . rosuvastatin (CRESTOR) 20 MG tablet Take 1 tablet (20 mg total) by mouth daily. 90 tablet 3   No current facility-administered medications on file prior to visit.   Marland Kitchenall Social History   Social History  . Marital Status: Divorced    Spouse Name: N/A  . Number of Children: N/A  . Years of Education: N/A   Occupational History  . Retired Civil engineer, contracting for [employer] History Main Topics  . Smoking status: Current Every Day Smoker -- 1.00 packs/day for 50 years  . Smokeless tobacco: Not on file  . Alcohol Use: No  . Drug Use: No  . Sexual Activity: Not on file   Other Topics Concern  . Not on file   Social History Narrative      Review of Systems  All other systems reviewed and are negative.      Objective:   Physical Exam  Cardiovascular: Normal rate and normal heart sounds.   No murmur heard. Pulmonary/Chest: Effort normal and breath sounds normal. No respiratory distress. He has no wheezes. He has no rales.  Skin: No erythema.  Vitals reviewed.         Assessment & Plan:  Visit for suture removal  ASCVD (arteriosclerotic cardiovascular disease) - Plan: COMPLETE METABOLIC PANEL WITH GFR, Lipid panel  5 horizontal mattress sutures removed without difficulty. Wound was reinforced with Steri-Strips. I recommended the patient return fasting for a fasting lipid panel. Goal LDL cholesterol is less than 70. I continue to encourage smoking cessation

## 2015-01-17 ENCOUNTER — Encounter: Payer: Self-pay | Admitting: Family Medicine

## 2015-01-17 ENCOUNTER — Other Ambulatory Visit: Payer: Self-pay | Admitting: Family Medicine

## 2015-01-17 MED ORDER — ROSUVASTATIN CALCIUM 20 MG PO TABS: 20.0000 mg | ORAL_TABLET | Freq: Every day | ORAL | Status: DC

## 2015-01-17 NOTE — Telephone Encounter (Signed)
Medication refill for one time only.  Patient needs to return for fasting lab work.  Letter sent for patient to call and schedule

## 2015-02-01 ENCOUNTER — Other Ambulatory Visit: Payer: Medicare Other

## 2015-02-01 LAB — COMPLETE METABOLIC PANEL WITH GFR
ALBUMIN: 3.9 g/dL (ref 3.6–5.1)
ALK PHOS: 66 U/L (ref 40–115)
ALT: 10 U/L (ref 9–46)
AST: 10 U/L (ref 10–35)
BUN: 21 mg/dL (ref 7–25)
CALCIUM: 8.6 mg/dL (ref 8.6–10.3)
CHLORIDE: 113 mmol/L — AB (ref 98–110)
CO2: 22 mmol/L (ref 20–31)
Creat: 1.16 mg/dL (ref 0.70–1.18)
GFR, EST AFRICAN AMERICAN: 71 mL/min (ref >=60)
GFR, Est Non African American: 61 mL/min (ref >=60)
Glucose, Bld: 102 mg/dL — ABNORMAL HIGH (ref 70–99)
Potassium: 4.6 mmol/L (ref 3.5–5.3)
Sodium: 144 mmol/L (ref 135–146)
Total Bilirubin: 0.4 mg/dL (ref 0.2–1.2)
Total Protein: 6.3 g/dL (ref 6.1–8.1)

## 2015-02-01 LAB — LIPID PANEL
CHOLESTEROL: 101 mg/dL — AB (ref 125–200)
HDL: 35 mg/dL — ABNORMAL LOW (ref >=40)
LDL Cholesterol: 50 mg/dL (ref <130)
TRIGLYCERIDES: 82 mg/dL (ref <150)
Total CHOL/HDL Ratio: 2.9 Ratio (ref <=5.0)
VLDL: 16 mg/dL (ref <30)

## 2015-02-04 ENCOUNTER — Encounter: Payer: Self-pay | Admitting: Family Medicine

## 2015-03-20 ENCOUNTER — Other Ambulatory Visit: Payer: Self-pay | Admitting: Family Medicine

## 2015-03-20 NOTE — Telephone Encounter (Signed)
Refill appropriate and filled per protocol. 

## 2015-03-21 ENCOUNTER — Other Ambulatory Visit: Payer: Self-pay | Admitting: Family Medicine

## 2015-03-21 NOTE — Telephone Encounter (Signed)
Medication refilled per protocol. 

## 2015-03-22 ENCOUNTER — Other Ambulatory Visit: Payer: Self-pay | Admitting: Family Medicine

## 2015-06-24 ENCOUNTER — Encounter (HOSPITAL_COMMUNITY): Payer: Self-pay | Admitting: Cardiology

## 2015-06-24 ENCOUNTER — Inpatient Hospital Stay (HOSPITAL_COMMUNITY)
Admission: EM | Admit: 2015-06-24 | Discharge: 2015-06-27 | DRG: 247 | Disposition: A | Discharge status: Home / Self Care | Payer: Medicare Other | Attending: Internal Medicine | Admitting: Internal Medicine

## 2015-06-24 ENCOUNTER — Emergency Department (HOSPITAL_COMMUNITY): Payer: Medicare Other

## 2015-06-24 DIAGNOSIS — I10 Essential (primary) hypertension: Secondary | ICD-10-CM | POA: Diagnosis not present

## 2015-06-24 DIAGNOSIS — F329 Major depressive disorder, single episode, unspecified: Secondary | ICD-10-CM | POA: Diagnosis not present

## 2015-06-24 DIAGNOSIS — Z23 Encounter for immunization: Secondary | ICD-10-CM

## 2015-06-24 DIAGNOSIS — E119 Type 2 diabetes mellitus without complications: Secondary | ICD-10-CM | POA: Diagnosis not present

## 2015-06-24 DIAGNOSIS — R001 Bradycardia, unspecified: Secondary | ICD-10-CM | POA: Diagnosis not present

## 2015-06-24 DIAGNOSIS — N400 Enlarged prostate without lower urinary tract symptoms: Secondary | ICD-10-CM | POA: Diagnosis present

## 2015-06-24 DIAGNOSIS — I214 Non-ST elevation (NSTEMI) myocardial infarction: Secondary | ICD-10-CM | POA: Diagnosis not present

## 2015-06-24 DIAGNOSIS — I252 Old myocardial infarction: Secondary | ICD-10-CM

## 2015-06-24 DIAGNOSIS — F17200 Nicotine dependence, unspecified, uncomplicated: Secondary | ICD-10-CM

## 2015-06-24 DIAGNOSIS — I480 Paroxysmal atrial fibrillation: Secondary | ICD-10-CM

## 2015-06-24 DIAGNOSIS — R739 Hyperglycemia, unspecified: Secondary | ICD-10-CM

## 2015-06-24 DIAGNOSIS — R072 Precordial pain: Secondary | ICD-10-CM | POA: Diagnosis not present

## 2015-06-24 DIAGNOSIS — R079 Chest pain, unspecified: Secondary | ICD-10-CM

## 2015-06-24 DIAGNOSIS — I2510 Atherosclerotic heart disease of native coronary artery without angina pectoris: Secondary | ICD-10-CM | POA: Diagnosis present

## 2015-06-24 DIAGNOSIS — H269 Unspecified cataract: Secondary | ICD-10-CM | POA: Diagnosis present

## 2015-06-24 DIAGNOSIS — I4891 Unspecified atrial fibrillation: Secondary | ICD-10-CM | POA: Diagnosis present

## 2015-06-24 DIAGNOSIS — Z79899 Other long term (current) drug therapy: Secondary | ICD-10-CM

## 2015-06-24 DIAGNOSIS — I209 Angina pectoris, unspecified: Secondary | ICD-10-CM | POA: Diagnosis not present

## 2015-06-24 DIAGNOSIS — E785 Hyperlipidemia, unspecified: Secondary | ICD-10-CM | POA: Diagnosis not present

## 2015-06-24 DIAGNOSIS — I482 Chronic atrial fibrillation: Secondary | ICD-10-CM | POA: Diagnosis not present

## 2015-06-24 DIAGNOSIS — Z955 Presence of coronary angioplasty implant and graft: Secondary | ICD-10-CM

## 2015-06-24 DIAGNOSIS — F32A Depression, unspecified: Secondary | ICD-10-CM | POA: Diagnosis present

## 2015-06-24 DIAGNOSIS — G4733 Obstructive sleep apnea (adult) (pediatric): Secondary | ICD-10-CM | POA: Diagnosis present

## 2015-06-24 DIAGNOSIS — R0789 Other chest pain: Secondary | ICD-10-CM | POA: Diagnosis not present

## 2015-06-24 DIAGNOSIS — Z7901 Long term (current) use of anticoagulants: Secondary | ICD-10-CM

## 2015-06-24 DIAGNOSIS — Z7982 Long term (current) use of aspirin: Secondary | ICD-10-CM

## 2015-06-24 LAB — BASIC METABOLIC PANEL
Anion gap: 11 (ref 5–15)
BUN: 19 mg/dL (ref 6–20)
CHLORIDE: 102 mmol/L (ref 101–111)
CO2: 27 mmol/L (ref 22–32)
CREATININE: 1.19 mg/dL (ref 0.61–1.24)
Calcium: 9.4 mg/dL (ref 8.9–10.3)
GFR calc Af Amer: >60 mL/min (ref >60)
GFR calc non Af Amer: 58 mL/min — ABNORMAL LOW (ref >60)
Glucose, Bld: 179 mg/dL — ABNORMAL HIGH (ref 65–99)
POTASSIUM: 4.7 mmol/L (ref 3.5–5.1)
Sodium: 140 mmol/L (ref 135–145)

## 2015-06-24 LAB — CBC
HEMATOCRIT: 50.2 % (ref 39.0–52.0)
Hemoglobin: 16.8 g/dL (ref 13.0–17.0)
MCH: 30.4 pg (ref 26.0–34.0)
MCHC: 33.5 g/dL (ref 30.0–36.0)
MCV: 90.9 fL (ref 78.0–100.0)
PLATELETS: 164 10*3/uL (ref 150–400)
RBC: 5.52 MIL/uL (ref 4.22–5.81)
RDW: 14.8 % (ref 11.5–15.5)
WBC: 7.8 10*3/uL (ref 4.0–10.5)

## 2015-06-24 LAB — I-STAT TROPONIN, ED: TROPONIN I, POC: 0.06 ng/mL (ref 0.00–0.08)

## 2015-06-24 LAB — PROTIME-INR
INR: 1.66 — ABNORMAL HIGH (ref 0.00–1.49)
Prothrombin Time: 19.6 seconds — ABNORMAL HIGH (ref 11.6–15.2)

## 2015-06-24 LAB — TROPONIN I: Troponin I: 2.85 ng/mL (ref <0.031)

## 2015-06-24 MED ORDER — HEPARIN (PORCINE) IN NACL 100-0.45 UNIT/ML-% IJ SOLN
1450.0000 [IU]/h | INTRAMUSCULAR | Status: DC
  Administered 2015-06-24: 1200 [IU]/h via INTRAVENOUS
  Filled 2015-06-24 (×2): qty 250

## 2015-06-24 MED ORDER — GI COCKTAIL ~~LOC~~
30.0000 mL | Freq: Four times a day (QID) | ORAL | Status: DC | PRN
  Filled 2015-06-24: qty 30

## 2015-06-24 MED ORDER — ACETAMINOPHEN 325 MG PO TABS: 650.0000 mg | ORAL_TABLET | ORAL | Status: DC | PRN

## 2015-06-24 MED ORDER — ASPIRIN EC 81 MG PO TBEC
81.0000 mg | DELAYED_RELEASE_TABLET | Freq: Every day | ORAL | Status: DC
  Administered 2015-06-25: 81 mg via ORAL
  Filled 2015-06-24: qty 1

## 2015-06-24 MED ORDER — ASPIRIN 81 MG PO CHEW
324.0000 mg | CHEWABLE_TABLET | Freq: Once | ORAL | Status: AC
  Administered 2015-06-24: 162 mg via ORAL
  Filled 2015-06-24: qty 4

## 2015-06-24 MED ORDER — PAROXETINE HCL 20 MG PO TABS
20.0000 mg | ORAL_TABLET | Freq: Every day | ORAL | Status: DC
  Administered 2015-06-24 – 2015-06-27 (×4): 20 mg via ORAL
  Filled 2015-06-24 (×4): qty 1

## 2015-06-24 MED ORDER — TICAGRELOR 90 MG PO TABS
180.0000 mg | ORAL_TABLET | Freq: Once | ORAL | Status: AC
  Administered 2015-06-24: 180 mg via ORAL
  Filled 2015-06-24: qty 2

## 2015-06-24 MED ORDER — METOPROLOL TARTRATE 12.5 MG HALF TABLET
12.5000 mg | ORAL_TABLET | Freq: Two times a day (BID) | ORAL | Status: DC
  Administered 2015-06-24: 12.5 mg via ORAL
  Filled 2015-06-24 (×2): qty 1

## 2015-06-24 MED ORDER — DABIGATRAN ETEXILATE MESYLATE 150 MG PO CAPS
150.0000 mg | ORAL_CAPSULE | Freq: Two times a day (BID) | ORAL | Status: DC
  Filled 2015-06-24: qty 1

## 2015-06-24 MED ORDER — MORPHINE SULFATE (PF) 2 MG/ML IV SOLN: 2.0000 mg | INTRAVENOUS | Status: DC | PRN

## 2015-06-24 MED ORDER — FINASTERIDE 5 MG PO TABS
5.0000 mg | ORAL_TABLET | Freq: Every day | ORAL | Status: DC
  Administered 2015-06-24 – 2015-06-27 (×4): 5 mg via ORAL
  Filled 2015-06-24 (×4): qty 1

## 2015-06-24 MED ORDER — NICOTINE POLACRILEX 2 MG MT GUM
2.0000 mg | CHEWING_GUM | OROMUCOSAL | Status: DC | PRN
  Filled 2015-06-24: qty 1

## 2015-06-24 MED ORDER — ONDANSETRON HCL 4 MG/2ML IJ SOLN: 4.0000 mg | Freq: Four times a day (QID) | INTRAMUSCULAR | Status: DC | PRN

## 2015-06-24 MED ORDER — AMLODIPINE BESYLATE 10 MG PO TABS
10.0000 mg | ORAL_TABLET | Freq: Every day | ORAL | Status: DC
  Administered 2015-06-24 – 2015-06-27 (×4): 10 mg via ORAL
  Filled 2015-06-24 (×4): qty 1

## 2015-06-24 MED ORDER — TICAGRELOR 90 MG PO TABS
90.0000 mg | ORAL_TABLET | Freq: Two times a day (BID) | ORAL | Status: DC
  Administered 2015-06-25 – 2015-06-26 (×4): 90 mg via ORAL
  Filled 2015-06-24 (×5): qty 1

## 2015-06-24 MED ORDER — ROSUVASTATIN CALCIUM 20 MG PO TABS
20.0000 mg | ORAL_TABLET | Freq: Every day | ORAL | Status: DC
  Administered 2015-06-24 – 2015-06-26 (×3): 20 mg via ORAL
  Filled 2015-06-24 (×3): qty 1

## 2015-06-24 NOTE — ED Notes (Signed)
Pt gives permission to discuss his medical care with his ex-wife: Janace Aris

## 2015-06-24 NOTE — Consult Note (Addendum)
Cardiologist:  Johnsie Cancel Reason for Consult: Chest Pain PCP:  Odette Fraction, MD   Eugene [name] is an 75 y.o. male.  HPI:   The patient is a 75 year old male with history of anemia, coronary artery disease, tobacco abuse, anxiety depression obstructive sleep apnea atrial fibrillation.  He had a stent to the PDA ostium in 2006.  Patient was last seen by Dr.Nishan 2011.  He apparently had a stress test at that time as well as echocardiogram.  He has not felt like he need to follow up with cardiology.  He presents with Cp which started about a week ago.  Two weeks ago he was doing a lot of work under his truck and was pulling himself underneath it.  He would take a pradaxa and ASA and the pain would ease off.  Today it did not so he came to be evaluated.  It seems to get a little worse with exertion.  A little dizzy.  His NTG is from 2006.  The patient currently denies nausea, vomiting, fever, shortness of breath, orthopnea, PND, cough, congestion, abdominal pain, hematochezia, melena, lower extremity edema, claudication.   Past Medical History  Diagnosis Date  . Hyperlipidemia   . Hypertension   . Coronary artery disease   . Anxiety   . Depressed   . BPH (benign prostatic hypertrophy)   . MI (myocardial infarction) ([place])   . OSA (obstructive sleep apnea)   . Atrial fibrillation ([place])   . Cataracts, both eyes     Past Surgical History  Procedure Laterality Date  . Coronary stent placement  10/2004    Family History  Problem Relation Age of Onset  . Sleep apnea Son     Social History:  reports that he has been smoking.  He does not have any smokeless tobacco history on file. He reports that he does not drink alcohol or use illicit drugs.  Allergies: No Known Allergies  Medications: Medication Sig  amLODipine (NORVASC) 10 MG tablet TAKE 1 BY MOUTH DAILY  aspirin 81 MG tablet Take 81 mg by mouth daily.   finasteride (PROSCAR) 5 MG tablet TAKE 1 BY MOUTH DAILY    metoprolol tartrate (LOPRESSOR) 25 MG tablet Take 0.5 tablets (12.5 mg total) by mouth 2 (two) times daily.  PARoxetine (PAXIL) 20 MG tablet Take 1 tablet (20 mg total) by mouth daily.  PRADAXA 150 MG CAPS capsule TAKE 1 BY MOUTH TWICE DAILY  rosuvastatin (CRESTOR) 20 MG tablet TAKE 1 BY MOUTH DAILY NO FURTHER REFILLS WITHOUT FOLLOW UP LAB WORK     Results for orders placed or performed during the hospital encounter of 06/24/15 (from the past 48 hour(s))  Basic metabolic panel     Status: Abnormal   Collection Time: 06/24/15 11:28 AM  Result Value Ref Range   Sodium 140 135 - 145 mmol/L   Potassium 4.7 3.5 - 5.1 mmol/L   Chloride 102 101 - 111 mmol/L   CO2 27 22 - 32 mmol/L   Glucose, Bld 179 (H) 65 - 99 mg/dL   BUN 19 6 - 20 mg/dL   Creatinine, Ser 1.19 0.61 - 1.24 mg/dL   Calcium 9.4 8.9 - 10.3 mg/dL   GFR calc non Af Amer 58 (L) >60 mL/min   GFR calc Af Amer >60 >60 mL/min    Comment: (NOTE) The eGFR has been calculated using the CKD EPI equation. This calculation has not been validated in all clinical situations. eGFR's persistently <60 mL/min signify possible Chronic Kidney  Disease.    Anion gap 11 5 - 15  CBC     Status: None   Collection Time: 06/24/15 11:28 AM  Result Value Ref Range   WBC 7.8 4.0 - 10.5 K/uL   RBC 5.52 4.22 - 5.81 MIL/uL   Hemoglobin 16.8 13.0 - 17.0 g/dL   HCT 50.2 39.0 - 52.0 %   MCV 90.9 78.0 - 100.0 fL   MCH 30.4 26.0 - 34.0 pg   MCHC 33.5 30.0 - 36.0 g/dL   RDW 14.8 11.5 - 15.5 %   Platelets 164 150 - 400 K/uL  Protime-INR - (order if Patient is taking Coumadin / Warfarin)     Status: Abnormal   Collection Time: 06/24/15 11:28 AM  Result Value Ref Range   Prothrombin Time 19.6 (H) 11.6 - 15.2 seconds   INR 1.66 (H) 0.00 - 1.49  I-stat troponin, ED (not at [facility], [facility])     Status: None   Collection Time: 06/24/15 11:37 AM  Result Value Ref Range   Troponin i, poc 0.06 0.00 - 0.08 ng/mL   Comment 3            Comment: Due to the release  kinetics of cTnI, a negative result within the first hours of the onset of symptoms does not rule out myocardial infarction with certainty. If myocardial infarction is still suspected, repeat the test at appropriate intervals.     Dg Chest 2 View  06/24/2015  CLINICAL DATA:  Left-sided chest pain for several days. History of hypertension, myocardial infarction and atrial fibrillation. EXAM: CHEST  2 VIEW COMPARISON:  Radiographs 10/21/2004. FINDINGS: The heart size and mediastinal contours are stable. There is aortic and coronary artery atherosclerosis. The lungs are clear. There is no pleural effusion or pneumothorax. The bones appear unchanged. IMPRESSION: Stable chest.  No acute cardiopulmonary process. Electronically Signed   By: Richardean Sale M.D.   On: 06/24/2015 12:25    Review of Systems  Constitutional: Negative for fever.  HENT: Negative for congestion.   Respiratory: Negative for cough and shortness of breath.   Cardiovascular: Positive for chest pain (left lateral chest). Negative for palpitations, orthopnea, leg swelling and PND.  Gastrointestinal: Negative for nausea, vomiting, abdominal pain, blood in stool and melena.  Genitourinary: Negative for hematuria.  Musculoskeletal: Negative for myalgias.  Neurological: Positive for dizziness (a little). Negative for weakness.  All other systems reviewed and are negative.  Blood pressure 131/93, pulse 85, temperature 97.8 F (36.6 C), temperature source Oral, resp. rate 16, height 6' (1.829 m), weight 199 lb (90.266 kg), SpO2 98 %. Physical Exam  Nursing note and vitals reviewed. Constitutional: He is oriented to person, place, and time. He appears well-developed and well-nourished. No distress.  HENT:  Head: Normocephalic and atraumatic.  Eyes: EOM are normal. Pupils are equal, round, and reactive to light. No scleral icterus.  Neck: Normal range of motion. Neck supple. No JVD present.  Cardiovascular: Normal rate, S1  normal and S2 normal.  An irregularly irregular rhythm present.  No murmur heard. Pulses:      Carotid pulses are 2+ on the right side, and 2+ on the left side.      Dorsalis pedis pulses are 2+ on the right side, and 2+ on the left side.  Respiratory: Effort normal and breath sounds normal. He has no wheezes. He has no rales. He exhibits tenderness (mild left sided.  Not the same as his presetning pain. ).  GI: Soft. Bowel sounds are  normal. He exhibits no distension. There is no tenderness.  Musculoskeletal: He exhibits no edema.  Lymphadenopathy:    He has no cervical adenopathy.  Neurological: He is alert and oriented to person, place, and time. He exhibits normal muscle tone.  Skin: Skin is warm and dry.  Psychiatric: He has a normal mood and affect.    Assessment/Plan: Active Problems:   TOBACCO ABUSE   Depression   Hypertension   CAD (coronary artery disease)   Atrial fibrillation (HCC)   BPH (benign prostatic hyperplasia)   Chest pain   Hyperglycemia  75 year old male with history of anemia, coronary artery disease, tobacco abuse-60py, anxiety depression, obstructive sleep apnea, chronic atrial fibrillation-on pradaxa.  He had a stent to the PDA ostium in 2006.  He presents with worsening CP over the last week.  May worsen with exertion.  Troponin is negative.  EKG Afib with controlled rate. Lateral TWI.  Multiform PVCs.  Continue to cycle troponin.  He is high risk for worsening CAD given ongoing tobacco abuse.  If troponin remains negative, Lexiscan cardiolite in the office.  Continue ASA 81, metoprolol 12.5 bid, amlodipine 10, crestor 20, pradaxa.  Check lipids.   Eugene [name], [place] [address] 06/24/2015, 2:20 PM   As above, patient seen and examined. Briefly he is a 75 year old male with past medical history of coronary artery disease, permanent atrial fibrillation, sleep apnea for evaluation of chest pain. Patient had previous PCI of his PDA in 2006. Over the past 1 week he has had  intermittent chest pain. It is in the left chest area without radiation. It is worse in the morning but improves with taking pradaxa and asa. Last one hour and resolves with these measures. It then returns in the evening. It is not pleuritic, positional or exertional. He had a more prolonged episode today and came to the emergency room to be evaluated. Note he does not have exertional chest pain. Electrocardiogram shows atrial fibrillation with PVCs or aberrantly conducted beats. Prior septal infarct. Inferior lateral ST depression. Initial enzymes are negative.   Symptoms are extremely atypical. Would cycle follow-up enzymes. If negative would arrange outpatient nuclear study in follow-up in regional. Patient instructed on the importance of discontinuing tobacco use. He has permanent atrial fibrillation. CHADSvasc 4. Continue pradaxa and metoprolol. If pt rules out, would DC ASA given need for anticoagulation. Continue statin at DC. Eugene [name]  ADDENDUM Pt is a 75 yo M w/ a PMH significant for 1vCAD s/p PCI oPDA, permanent afib, and OSA p/w CP. Initial felt to be atypical and biomarkers negative though EKG showed afib and inferolateral ST depression new from prior. Repeat 2nd and 3rd trops subsequently positive. Would revise above plan as follows: 1.) Admit tele 2.) Cycle trop, CK-MB, and CPK - EKG with CEs 3.) Lipid panel, TFTs, HgbA1c 4.) Formal echo to assess LVEF in AM 5.) NPO after midnight for likely LHC 6.) Stop dabigatran, discuss timing of heparin gtt with pharmacy 7.) Continue ASA, load ticagrelor 8.) Continue metop, consider ACEI prior to d/c 9.) Continue high-potency statin therapy w/ rosuva  These recommendations were directly communicated to [facility] hospitalist. We will continue to follow along.  Eugene Schlichter, MD

## 2015-06-24 NOTE — Progress Notes (Signed)
RN paged this NP with 2nd troponin level of 1.62. Troponin neg in ED when pt admitted. Per RN, pt is NOT having any active chest pain.   Brief HPI: Pt admitted earlier after having CP with EKG changes for ACS r/o. Pt with hx of CAD and PCI in past. Cardiology saw pt in consult earlier. Pt received one dose of 325mg  Aspirin when admitted.   This NP paged cardiology on call and discussed pt/case. Chart reviewed by cardiology MD and recommendations ordered-Loading dose of Brilinta 180mg  now. Start Brilinta tomorrow at 90mg  po bid. Start Heparin gtt now. Check CK/MB. Stop Pradaxa and this NP made pharmD aware that pt had been taking Pradaxa at home given heparin drip order. EKG in am or stat if pt has any chest pain. Call cardiology back should pt have chest pain tonight. Continue to cycle troponins. NPO after MN for probable cardiac cath in am.  Risk stratification: pt has HbA1c pending. For fasting lipids in am. Is on Metoprolol, ASA and a statin. Echo ordered.  NP called RN back to discuss plan and that ANY chest pain should be reported immediately and an EKG obtained.  KJKG, NP [facility]

## 2015-06-24 NOTE — ED Notes (Signed)
Pt reports chest pain that has been present for about a week, with some SOB.

## 2015-06-24 NOTE — ED Notes (Signed)
Patient transported to X-ray 

## 2015-06-24 NOTE — H&P (Signed)
[facility] History and Physical  Eugene [name] [id number] DOB: 1940/12/09 DOA: 06/24/2015  Referring physician: Emergency Department PCP: Odette Fraction, MD   CHIEF COMPLAINT:  Chest pain                 HPI: Eugene [name] is a 75 y.o. male weith a one week history of chest pain with mild exertion. Pain sharp, relieved with Pradaxa which he takes twice daily. Also "holding" his chest helps. Pain usually lasts an hour except today it lasted three. Pain does not radiate. It is not affected by deep breaths or cough.  No associated nausea or diaphoresis. Not compliant with daily aspirin though he took one today at onset of CP. No shortness of breath.       ED COURSE:           Labs:   Chem profile remarkable for glucose of 179 Troponin 0.06, CBC normal   EKG:     Atrial fibrillation with rapid ventricular response with premature ventricular or aberrantly conducted complexes Anteroseptal infarct , age undetermined ST & T wave abnormality, consider inferolateral ischemia Abnormal ECG Ventricular rate 109                 Review of Systems  Constitutional: Negative.   Eyes: Negative.   Respiratory: Negative.   Cardiovascular: Positive for chest pain.  Gastrointestinal: Negative.   Genitourinary: Negative.   Musculoskeletal: Negative.   Skin: Negative.   Neurological: Negative.   Endo/Heme/Allergies: Negative.   Psychiatric/Behavioral: Negative.     Past Medical History  Diagnosis Date  . Hyperlipidemia   . Hypertension   . Coronary artery disease   . Anxiety   . Depressed   . BPH (benign prostatic hypertrophy)   . MI (myocardial infarction) ([place])   . OSA (obstructive sleep apnea)   . Atrial fibrillation ([place])   . Cataracts, both eyes    Past Surgical History  Procedure Laterality Date  . Coronary stent placement  10/2004    SOCIAL HISTORY:  reports that he has been smoking.  He does not have any smokeless tobacco history on file. He reports  that he does not drink alcohol or use illicit drugs. Lives:  alone      Assistive devices:   None needed for ambulation.   No Known Allergies  Family History  Problem Relation Age of Onset  . Sleep apnea Son   hypertension - mother  Prior to Admission medications   Medication Sig Start Date End Date Taking? Authorizing Provider  amLODipine (NORVASC) 10 MG tablet TAKE 1 BY MOUTH DAILY 08/22/14  Yes Susy Frizzle, MD  aspirin 81 MG tablet Take 81 mg by mouth daily.    Yes Historical Provider, MD  finasteride (PROSCAR) 5 MG tablet TAKE 1 BY MOUTH DAILY 08/22/14  Yes Susy Frizzle, MD  metoprolol tartrate (LOPRESSOR) 25 MG tablet Take 0.5 tablets (12.5 mg total) by mouth 2 (two) times daily. 11/15/14  Yes Susy Frizzle, MD  PARoxetine (PAXIL) 20 MG tablet Take 1 tablet (20 mg total) by mouth daily. 08/22/14  Yes Susy Frizzle, MD  PRADAXA 150 MG CAPS capsule TAKE 1 BY MOUTH TWICE DAILY 03/21/15  Yes Susy Frizzle, MD  rosuvastatin (CRESTOR) 20 MG tablet TAKE 1 BY MOUTH DAILY NO FURTHER REFILLS WITHOUT FOLLOW UP LAB WORK 03/22/15  Yes Susy Frizzle, MD   PHYSICAL EXAM: Filed Vitals:   06/24/15 1116  BP: 160/91  Pulse: 115  Temp:  97.8 F (36.6 C)  TempSrc: Oral  Resp: 16  Height: 6' (1.829 m)  Weight: 90.266 kg (199 lb)  SpO2: 97%    Wt Readings from Last 3 Encounters:  06/24/15 90.266 kg (199 lb)  01/01/15 90.719 kg (200 lb)  12/25/14 88.451 kg (195 lb)    General:  Pleasant  white   male. Appears calm and comfortable Eyes: PER, normal lids, irises & conjunctiva ENT: grossly normal hearing, lips & tongue Neck: no LAD, no masses Cardiovascular: Regular rate , rhythm irregular . No LE edema.  Respiratory: Rhonchi throughout both lung fields Respirations even and unlabored. Normal respiratory effort.  Abdomen: soft, non-distended, non-tender, active bowel sounds. No obvious masses.  Skin: no rash seen on limited exam Musculoskeletal: grossly normal tone  BUE/BLE Psychiatric: grossly normal mood and affect, speech fluent and appropriate Neurologic: grossly non-focal.         LABS ON ADMISSION:    Basic Metabolic Panel:  Recent Labs Lab 06/24/15 1128  NA 140  K 4.7  CL 102  CO2 27  GLUCOSE 179*  BUN 19  CREATININE 1.19  CALCIUM 9.4   CBC:  Recent Labs Lab 06/24/15 1128  WBC 7.8  HGB 16.8  HCT 50.2  MCV 90.9  PLT 164    CREATININE: 1.19 (06/24/15 1128) Estimated creatinine clearance - 58 mL/min  Radiological Exams on Admission: Dg Chest 2 View  06/24/2015  CLINICAL DATA:  Left-sided chest pain for several days. History of hypertension, myocardial infarction and atrial fibrillation. EXAM: CHEST  2 VIEW COMPARISON:  Radiographs 10/21/2004. FINDINGS: The heart size and mediastinal contours are stable. There is aortic and coronary artery atherosclerosis. The lungs are clear. There is no pleural effusion or pneumothorax. The bones appear unchanged. IMPRESSION: Stable chest.  No acute cardiopulmonary process. Electronically Signed   By: Richardean Sale M.D.   On: 06/24/2015 12:25    ASSESSMENT / PLAN   Chest pain,HEART score 5-6.  History of CAD / remote PCI. CXR without acute findings. Initial troponin normal, + ST and T wave abnormalities on ECG. Rule out ACS. -Admit to Observation - Telemetry bed -took baby ASA at home this am. Will give 3 additional baby  ASA now. -Cycle trops -Fasting lipid profile -echocardiogram -repeat ekg in am -GI coctail    Atrial fibrillation ([place]). Chadsvasc 4.  Rate controlled.  -Monitor on telemetry -Continue beta blocker -continue pradaxa  Hypertension, stable at present.  -continue home BP meds -add hydralazine prn 10mg  IV q4-8hr PRN SBP>180 or DBP>100.  CAD (coronary artery disease). Remote PCI. Not followed by Cardiology  Hyperglycemia, glucose 179 -Obtain hemoglobin A1c  Tobacco abuse, 60+ years.Doesn't want to quit   Depression.  -continue paxil  BPH (benign prostatic  hyperplasia) -continue proscar  CONSULTANTS:   Cardiology called. Will see patient today   Code Status: DO NOT INTUBATE DVT Prophylaxis: already anti-coagulated with pradaxa Family Communication:  Patient alert, oriented and understands plan of care.  Disposition Plan: Discharge to home in 24-48 hours   Time spent: 60 minutes Tye Savoy  NP [facility] Pager [phone]

## 2015-06-24 NOTE — ED Notes (Signed)
Admitting MD at bedside.

## 2015-06-24 NOTE — Consult Note (Signed)
ANTICOAGULATION CONSULT NOTE - Initial Consult  Pharmacy Consult for Heparin Indication: atrial fibrillation, chest pain/ACS  No Known Allergies  Patient Measurements: Height: 6' (182.9 cm) Weight: 195 lb 8 oz (88.678 kg) IBW/kg (Calculated) : 77.6  Vital Signs: Temp: 98.1 F (36.7 C) (02/06 2002) Temp Source: Oral (02/06 2002) BP: 124/68 mmHg (02/06 2002) Pulse Rate: 74 (02/06 2002)  Labs:  Recent Labs  06/24/15 1128 06/24/15 1800 06/24/15 2018  HGB 16.8  --   --   HCT 50.2  --   --   PLT 164  --   --   LABPROT 19.6*  --   --   INR 1.66*  --   --   CREATININE 1.19  --   --   TROPONINI  --  1.62* 2.85*    Estimated Creatinine Clearance: 58 mL/min (by C-G formula based on Cr of 1.19).   Medical History: Past Medical History  Diagnosis Date  . Hyperlipidemia   . Hypertension   . Coronary artery disease   . Anxiety   . Depressed   . BPH (benign prostatic hypertrophy)   . MI (myocardial infarction) ([place])   . OSA (obstructive sleep apnea)   . Atrial fibrillation ([place])   . Cataracts, both eyes    Assessment: 74yom on pradaxa pta for afib, admitted with chest pain. Troponins positive and trending up. Pradaxa will be held and he will start IV heparin pending further cardiac workup. Per patient, last pradaxa dose was this morning at 0830.  Goal of Therapy:  Heparin level 0.3-0.7 units/ml Monitor platelets by anticoagulation protocol: Yes   Plan:  1) Begin heparin at 1200 units/hr with no bolus 2) Check 8 hour heparin level 3) Daily heparin level and CBC  Deboraha Sprang 06/24/2015,9:17 PM

## 2015-06-24 NOTE — ED Provider Notes (Signed)
CSN: [id number]     Arrival date & time 06/24/15  1107 History   First MD Initiated Contact with Patient 06/24/15 1144     Chief Complaint  Patient presents with  . Chest Pain     (Consider location/radiation/quality/duration/timing/severity/associated sxs/prior Treatment) HPI   74yM with CP. Onset about 3 days ago. L side chest pressure. Has been intermittent. 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 Worse with exertion. Has a past history of CAD with stenting. Reports recent symptoms feel similar to prior to previous stent placement. Associated mild sob. No nausea, palpitations or diaphoresis. Reports improvement of pain shortly after taking pradaxa and aspirin. Today pain didn't resolve after this so came to ED.   Past Medical History  Diagnosis Date  . Hyperlipidemia   . Hypertension   . Coronary artery disease   . Anxiety   . Depressed   . BPH (benign prostatic hypertrophy)   . MI (myocardial infarction) ([place])   . OSA (obstructive sleep apnea)   . Atrial fibrillation ([place])   . Cataracts, both eyes    Past Surgical History  Procedure Laterality Date  . Coronary stent placement  10/2004   Family History  Problem Relation Age of Onset  . Sleep apnea Son    Social History  Substance Use Topics  . Smoking status: Current Every Day Smoker -- 1.00 packs/day for 50 years  . Smokeless tobacco: None  . Alcohol Use: No    Review of Systems  All systems reviewed and negative, other than as noted in HPI.   Allergies  Review of patient's allergies indicates no known allergies.  Home Medications   Prior to Admission medications   Medication Sig Start Date End Date Taking? Authorizing Provider  amLODipine (NORVASC) 10 MG tablet TAKE 1 BY MOUTH DAILY 08/22/14  Yes Susy Frizzle, MD  aspirin 81 MG tablet Take 81 mg by mouth daily.    Yes Historical Provider, MD  finasteride (PROSCAR) 5 MG tablet TAKE 1 BY MOUTH DAILY 08/22/14  Yes  Susy Frizzle, MD  metoprolol tartrate (LOPRESSOR) 25 MG tablet Take 0.5 tablets (12.5 mg total) by mouth 2 (two) times daily. 11/15/14  Yes Susy Frizzle, MD  PARoxetine (PAXIL) 20 MG tablet Take 1 tablet (20 mg total) by mouth daily. 08/22/14  Yes Susy Frizzle, MD  PRADAXA 150 MG CAPS capsule TAKE 1 BY MOUTH TWICE DAILY 03/21/15  Yes Susy Frizzle, MD  rosuvastatin (CRESTOR) 20 MG tablet TAKE 1 BY MOUTH DAILY NO FURTHER REFILLS WITHOUT FOLLOW UP LAB WORK 03/22/15  Yes Susy Frizzle, MD   BP 157/94 mmHg  Pulse 68  Temp(Src) 97.8 F (36.6 C) (Oral)  Resp 13  Ht 6' (1.829 m)  Wt 199 lb (90.266 kg)  BMI 26.98 kg/m2  SpO2 98% Physical Exam  Constitutional: He is oriented to person, place, and time. He appears well-developed and well-nourished. No distress.  HENT:  Head: Normocephalic and atraumatic.  Eyes: Conjunctivae are normal. Right eye exhibits no discharge. Left eye exhibits no discharge.  Neck: Neck supple.  Cardiovascular: Normal rate, regular rhythm and normal heart sounds.  Exam reveals no gallop and no friction rub.   No murmur heard. Pulmonary/Chest: Effort normal and breath sounds normal. No respiratory distress.  Abdominal: Soft. He exhibits no distension. There is no tenderness.  Musculoskeletal: He exhibits no edema or tenderness.  Lower extremities symmetric as compared to each other. No calf tenderness. Negative Homan's.  No palpable cords.   Neurological: He is alert and oriented to person, place, and time.  Skin: Skin is warm and dry.  Psychiatric: He has a normal mood and affect. His behavior is normal. Thought content normal.  Nursing note and vitals reviewed.   ED Course  Procedures (including critical care time) Labs Review Labs Reviewed  BASIC METABOLIC PANEL - Abnormal; Notable for the following:    Glucose, Bld 179 (*)    GFR calc non Af Amer 58 (*)    All other components within normal limits  PROTIME-INR - Abnormal; Notable for the  following:    Prothrombin Time 19.6 (*)    INR 1.66 (*)    All other components within normal limits  CBC  I-STAT TROPOININ, ED    Imaging Review Dg Chest 2 View  06/24/2015  CLINICAL DATA:  Left-sided chest pain for several days. History of hypertension, myocardial infarction and atrial fibrillation. EXAM: CHEST  2 VIEW COMPARISON:  Radiographs 10/21/2004. FINDINGS: The heart size and mediastinal contours are stable. There is aortic and coronary artery atherosclerosis. The lungs are clear. There is no pleural effusion or pneumothorax. The bones appear unchanged. IMPRESSION: Stable chest.  No acute cardiopulmonary process. Electronically Signed   By: Richardean Sale M.D.   On: 06/24/2015 12:25   I have personally reviewed and evaluated these images and lab results as part of my medical decision-making.   EKG Interpretation   Date/Time:  Monday June 24 2015 11:17:44 EST Ventricular Rate:  109 PR Interval:    QRS Duration: 96 QT Interval:  310 QTC Calculation: 417 R Axis:   65 Text Interpretation:  Atrial fibrillation with rapid ventricular response  with premature ventricular or aberrantly conducted complexes Anteroseptal  infarct , age undetermined ST \\T \ T wave abnormality, consider  inferolateral ischemia Abnormal ECG Confirmed by Rob Mciver  MD, Ashlynn Gunnels ([id number])  on 06/24/2015 12:29:14 PM      MDM   Final diagnoses:  Chest pain, unspecified chest pain type    74yM with CP. Known CAD. New ischemic appearing EKG changes. Admit.     Virgel Manifold, MD 06/25/15 [phone]

## 2015-06-25 ENCOUNTER — Observation Stay (HOSPITAL_COMMUNITY): Payer: Medicare Other

## 2015-06-25 DIAGNOSIS — F329 Major depressive disorder, single episode, unspecified: Secondary | ICD-10-CM | POA: Diagnosis not present

## 2015-06-25 DIAGNOSIS — I209 Angina pectoris, unspecified: Secondary | ICD-10-CM | POA: Diagnosis not present

## 2015-06-25 DIAGNOSIS — Z23 Encounter for immunization: Secondary | ICD-10-CM | POA: Diagnosis not present

## 2015-06-25 DIAGNOSIS — I10 Essential (primary) hypertension: Secondary | ICD-10-CM | POA: Diagnosis not present

## 2015-06-25 DIAGNOSIS — N400 Enlarged prostate without lower urinary tract symptoms: Secondary | ICD-10-CM | POA: Diagnosis not present

## 2015-06-25 DIAGNOSIS — I214 Non-ST elevation (NSTEMI) myocardial infarction: Secondary | ICD-10-CM | POA: Diagnosis not present

## 2015-06-25 DIAGNOSIS — H269 Unspecified cataract: Secondary | ICD-10-CM | POA: Diagnosis present

## 2015-06-25 DIAGNOSIS — E119 Type 2 diabetes mellitus without complications: Secondary | ICD-10-CM | POA: Diagnosis not present

## 2015-06-25 DIAGNOSIS — R079 Chest pain, unspecified: Secondary | ICD-10-CM | POA: Diagnosis not present

## 2015-06-25 DIAGNOSIS — Z7901 Long term (current) use of anticoagulants: Secondary | ICD-10-CM | POA: Diagnosis not present

## 2015-06-25 DIAGNOSIS — G4733 Obstructive sleep apnea (adult) (pediatric): Secondary | ICD-10-CM | POA: Diagnosis present

## 2015-06-25 DIAGNOSIS — F17200 Nicotine dependence, unspecified, uncomplicated: Secondary | ICD-10-CM | POA: Diagnosis not present

## 2015-06-25 DIAGNOSIS — E785 Hyperlipidemia, unspecified: Secondary | ICD-10-CM | POA: Diagnosis present

## 2015-06-25 DIAGNOSIS — I482 Chronic atrial fibrillation: Secondary | ICD-10-CM

## 2015-06-25 DIAGNOSIS — Z7982 Long term (current) use of aspirin: Secondary | ICD-10-CM | POA: Diagnosis not present

## 2015-06-25 DIAGNOSIS — R001 Bradycardia, unspecified: Secondary | ICD-10-CM | POA: Diagnosis not present

## 2015-06-25 DIAGNOSIS — I252 Old myocardial infarction: Secondary | ICD-10-CM | POA: Diagnosis not present

## 2015-06-25 DIAGNOSIS — Z79899 Other long term (current) drug therapy: Secondary | ICD-10-CM | POA: Diagnosis not present

## 2015-06-25 DIAGNOSIS — I2510 Atherosclerotic heart disease of native coronary artery without angina pectoris: Secondary | ICD-10-CM

## 2015-06-25 DIAGNOSIS — Z955 Presence of coronary angioplasty implant and graft: Secondary | ICD-10-CM | POA: Diagnosis not present

## 2015-06-25 LAB — LIPID PANEL
Cholesterol: 100 mg/dL (ref 0–200)
HDL: 35 mg/dL — AB (ref >40)
LDL Cholesterol: 48 mg/dL (ref 0–99)
TRIGLYCERIDES: 86 mg/dL (ref <150)
Total CHOL/HDL Ratio: 2.9 RATIO
VLDL: 17 mg/dL (ref 0–40)

## 2015-06-25 LAB — HEMOGLOBIN A1C
Hgb A1c MFr Bld: 7.3 % — ABNORMAL HIGH (ref 4.8–5.6)
MEAN PLASMA GLUCOSE: 163 mg/dL

## 2015-06-25 LAB — BASIC METABOLIC PANEL
ANION GAP: 9 (ref 5–15)
BUN: 17 mg/dL (ref 6–20)
CALCIUM: 8.9 mg/dL (ref 8.9–10.3)
CHLORIDE: 106 mmol/L (ref 101–111)
CO2: 23 mmol/L (ref 22–32)
Creatinine, Ser: 1.22 mg/dL (ref 0.61–1.24)
GFR calc non Af Amer: 56 mL/min — ABNORMAL LOW (ref >60)
GLUCOSE: 187 mg/dL — AB (ref 65–99)
POTASSIUM: 4.5 mmol/L (ref 3.5–5.1)
Sodium: 138 mmol/L (ref 135–145)

## 2015-06-25 LAB — GLUCOSE, CAPILLARY: GLUCOSE-CAPILLARY: 127 mg/dL — AB (ref 65–99)

## 2015-06-25 LAB — HEPARIN LEVEL (UNFRACTIONATED)
HEPARIN UNFRACTIONATED: 0.14 [IU]/mL — AB (ref 0.30–0.70)
Heparin Unfractionated: 0.4 IU/mL (ref 0.30–0.70)

## 2015-06-25 LAB — CK TOTAL AND CKMB (NOT AT ARMC)
CK, MB: 13.2 ng/mL — ABNORMAL HIGH (ref 0.5–5.0)
Relative Index: 7.3 — ABNORMAL HIGH (ref 0.0–2.5)
Total CK: 180 U/L (ref 49–397)

## 2015-06-25 LAB — TROPONIN I
TROPONIN I: 3.57 ng/mL — AB (ref <0.031)
Troponin I: 1.62 ng/mL (ref <0.031)
Troponin I: 3.63 ng/mL (ref <0.031)

## 2015-06-25 MED ORDER — INSULIN ASPART 100 UNIT/ML ~~LOC~~ SOLN
0.0000 [IU] | Freq: Three times a day (TID) | SUBCUTANEOUS | Status: DC
  Administered 2015-06-25: 1 [IU] via SUBCUTANEOUS

## 2015-06-25 MED ORDER — SODIUM CHLORIDE 0.9 % WEIGHT BASED INFUSION: 1.0000 mL/kg/h | INTRAVENOUS | Status: DC

## 2015-06-25 MED ORDER — SODIUM CHLORIDE 0.9 % WEIGHT BASED INFUSION
3.0000 mL/kg/h | INTRAVENOUS | Status: DC
Start: 2015-06-26 — End: 2015-06-26
  Administered 2015-06-26: 3 mL/kg/h via INTRAVENOUS

## 2015-06-25 NOTE — Progress Notes (Signed)
ANTICOAGULATION CONSULT NOTE - Follow Up Consult  Pharmacy Consult for Heparin Indication: atrial fibrillation  No Known Allergies  Patient Measurements: Height: 6' (182.9 cm) Weight: 195 lb 8 oz (88.678 kg) IBW/kg (Calculated) : 77.6 Heparin Dosing Weight:   Vital Signs: Temp: 97.7 F (36.5 C) (02/07 1403) Temp Source: Oral (02/07 1403) BP: 97/62 mmHg (02/07 1403) Pulse Rate: 57 (02/07 1403)  Labs:  Recent Labs  06/24/15 1128  06/24/15 2018 06/24/15 2324 06/25/15 0250 06/25/15 0535 06/25/15 1245 06/25/15 1502  HGB 16.8  --   --   --   --   --   --   --   HCT 50.2  --   --   --   --   --   --   --   PLT 164  --   --   --   --   --   --   --   LABPROT 19.6*  --   --   --   --   --   --   --   INR 1.66*  --   --   --   --   --   --   --   HEPARINUNFRC  --   --   --   --   --  0.14*  --  0.40  CREATININE 1.19  --   --   --   --   --  1.22  --   CKTOTAL  --   --   --  180  --   --   --   --   CKMB  --   --   --  13.2*  --   --   --   --   TROPONINI  --   < > 2.85* 3.63* 3.57*  --   --   --   < > = values in this interval not displayed.  Estimated Creatinine Clearance: 56.5 mL/min (by C-G formula based on Cr of 1.22).   Assessment:  Anticoagulation: Pradaxa PTA for Afib, now with CP and positive troponins to begin heparin Last pradaxa dose 2/6 2 0830. HL 0.14>0.4 now in goal. No CBC today.  Goal of Therapy:  Heparin level 0.3-0.7 units/ml Monitor platelets by anticoagulation protocol: Yes   Plan:  Cath 2/8 1130 Heparin 1450 units/hr   Anwen Cannedy S. Alford Highland, PharmD, BCPS Clinical Staff Pharmacist Pager [phone]  Eilene Ghazi Stillinger 06/25/2015,4:04 PM

## 2015-06-25 NOTE — Progress Notes (Signed)
    Subjective:  Denies CP or dyspnea   Objective:  Filed Vitals:   06/24/15 1740 06/24/15 2002 06/25/15 0342 06/25/15 0536  BP: 157/105 124/68 127/62 114/79  Pulse: 69 74 62 57  Temp: 98.4 F (36.9 C) 98.1 F (36.7 C) 98.5 F (36.9 C) 98.2 F (36.8 C)  TempSrc: Oral Oral Oral Oral  Resp: 20 18 16 18   Height: 6' (1.829 m)     Weight: 195 lb 8 oz (88.678 kg)     SpO2: 98% 96% 99% 99%    Intake/Output from previous day:  Intake/Output Summary (Last 24 hours) at 06/25/15 1021 Last data filed at 06/25/15 0649  Gross per 24 hour  Intake  463.6 ml  Output    750 ml  Net -286.4 ml    Physical Exam: Physical exam: Well-developed well-nourished in no acute distress.  Skin is warm and dry.  HEENT is normal.  Neck is supple.  Chest is clear to auscultation with normal expansion.  Cardiovascular exam is irregular Abdominal exam nontender or distended. No masses palpated. Extremities show no edema. neuro grossly intact    Lab Results: Basic Metabolic Panel:  Recent Labs  06/24/15 1128  NA 140  K 4.7  CL 102  CO2 27  GLUCOSE 179*  BUN 19  CREATININE 1.19  CALCIUM 9.4   CBC:  Recent Labs  06/24/15 1128  WBC 7.8  HGB 16.8  HCT 50.2  MCV 90.9  PLT 164   Cardiac Enzymes:  Recent Labs  06/24/15 2018 06/24/15 2324 06/25/15 0250  CKTOTAL  --  180  --   CKMB  --  13.2*  --   TROPONINI 2.85* 3.63* 3.57*     Assessment/Plan:  1 non-ST elevation myocardial infarction-patient has ruled in. He is presently pain-free. Treat with aspirin, heparin and statin. Discontinue metoprolol as his heart rate is in the 30s at times.  He was given brilinta by cardiology fellow; will continue. Hold pradaxa. Proceed with cardiac catheterization tomorrow morning. The risks and benefits were discussed and the patient agrees to proceed. Await echocardiogram to assess LV function. 2 permanent atrial fibrillation-discontinue metoprolol as his heart rate was transiently in the  30s. Resume pradaxa following procedures. 3 tobacco abuse-patient counseled on discontinuing. 4 Hyperlipidemia-continue statin.  Kirk Ruths 06/25/2015, 10:21 AM

## 2015-06-25 NOTE — Progress Notes (Signed)
Per insurance check on Brilinta S/W SARI @ [facility] # [phone] OPT- 5   BRILINTA 90 MG BID FOR 30 DAY SUPPLY   COVER-YES  CO- PAY- $ 37.00  TIER- 3 DRUG  PRIOR APPROVAL- [facility]- [employer] # [id number]

## 2015-06-25 NOTE — Progress Notes (Signed)
  Echocardiogram 2D Echocardiogram has been performed.  Eugene [name] 06/25/2015, 9:04 AM

## 2015-06-25 NOTE — Progress Notes (Signed)
ANTICOAGULATION CONSULT NOTE - Follow Up Consult  Pharmacy Consult for Heparin  Indication: chest pain/ACS and atrial fibrillation  No Known Allergies  Patient Measurements: Height: 6' (182.9 cm) Weight: 195 lb 8 oz (88.678 kg) IBW/kg (Calculated) : 77.6  Vital Signs: Temp: 98.2 F (36.8 C) (02/07 0536) Temp Source: Oral (02/07 0536) BP: 114/79 mmHg (02/07 0536) Pulse Rate: 57 (02/07 0536)  Labs:  Recent Labs  06/24/15 1128  06/24/15 2018 06/24/15 2324 06/25/15 0250 06/25/15 0535  HGB 16.8  --   --   --   --   --   HCT 50.2  --   --   --   --   --   PLT 164  --   --   --   --   --   LABPROT 19.6*  --   --   --   --   --   INR 1.66*  --   --   --   --   --   HEPARINUNFRC  --   --   --   --   --  0.14*  CREATININE 1.19  --   --   --   --   --   CKTOTAL  --   --   --  180  --   --   CKMB  --   --   --  13.2*  --   --   TROPONINI  --   < > 2.85* 3.63* 3.57*  --   < > = values in this interval not displayed.  Estimated Creatinine Clearance: 58 mL/min (by C-G formula based on Cr of 1.19).  Assessment: Heparin level is sub-therapeutic this AM, no issues per RN.   Goal of Therapy:  Heparin level 0.3-0.7 units/ml Monitor platelets by anticoagulation protocol: Yes   Plan:  -Increase heparin drip to 1450 units/hr -1500 HL  Narda Bonds 06/25/2015,6:42 AM

## 2015-06-25 NOTE — Progress Notes (Signed)
[facility] PROGRESS NOTE  Eugene [name] [id number] DOB: [birth year] DOA: 06/24/2015 PCP: Odette Fraction, MD  Assessment/Plan: Eugene [name] is a 75 y.o. male weith a one week history of chest pain with mild exertion. Pain sharp, relieved with Pradaxa which he takes twice daily. Also "holding" his chest helps. Pain usually lasts an hour except today it lasted three. Pain does not radiate. It is not affected by deep breaths or cough. No associated nausea or diaphoresis. Not compliant with daily aspirin though he took one today at onset of CP. No shortness of breath.   NSTEMI;  Continue with heparin Gtt.  Started on Brilinta.  Continue with Crestor.  Hold metoprolol due to bradycardia  A fib; hold pradaxa , plan for cath tomorrow.  Holding metoprolol due to bradycardia.   HTN; continue with Norvasc.   CAD; on medical tx, plan for cath tomorrow.   New diagnosis Diabetes; Hb=-A1c at 7.3.    Code Status: Partial Code.  Family Communication: care discussed with patient  Disposition Plan: remain inpatient for treatment of N-STEMI,. Probably home at time of discharge   Consultants:  Cardiology   Procedures:  ECHO pending.   Antibiotics:  none  HPI/Subjective: Chest pain free..   Objective: Filed Vitals:   06/25/15 0342 06/25/15 0536  BP: 127/62 114/79  Pulse: 62 57  Temp: 98.5 F (36.9 C) 98.2 F (36.8 C)  Resp: 16 18    Intake/Output Summary (Last 24 hours) at 06/25/15 1015 Last data filed at 06/25/15 0649  Gross per 24 hour  Intake  463.6 ml  Output    750 ml  Net -286.4 ml   Filed Weights   06/24/15 1116 06/24/15 1740  Weight: 90.266 kg (199 lb) 88.678 kg (195 lb 8 oz)    Exam:   General:  NAD  Cardiovascular: S 1, S 2 RRR  Respiratory: CTA  Abdomen: BS present, soft, nt  Musculoskeletal: no edema  Data Reviewed: Basic Metabolic Panel:  Recent Labs Lab 06/24/15 1128  NA 140  K 4.7  CL 102  CO2 27   GLUCOSE 179*  BUN 19  CREATININE 1.19  CALCIUM 9.4   Liver Function Tests: No results for input(s): AST, ALT, ALKPHOS, BILITOT, PROT, ALBUMIN in the last 168 hours. No results for input(s): LIPASE, AMYLASE in the last 168 hours. No results for input(s): AMMONIA in the last 168 hours. CBC:  Recent Labs Lab 06/24/15 1128  WBC 7.8  HGB 16.8  HCT 50.2  MCV 90.9  PLT 164   Cardiac Enzymes:  Recent Labs Lab 06/24/15 1800 06/24/15 2018 06/24/15 2324 06/25/15 0250  CKTOTAL  --   --  180  --   CKMB  --   --  13.2*  --   TROPONINI 1.62* 2.85* 3.63* 3.57*   BNP (last 3 results) No results for input(s): BNP in the last 8760 hours.  ProBNP (last 3 results) No results for input(s): PROBNP in the last 8760 hours.  CBG: No results for input(s): GLUCAP in the last 168 hours.  No results found for this or any previous visit (from the past 240 hour(s)).   Studies: Dg Chest 2 View  06/24/2015  CLINICAL DATA:  Left-sided chest pain for several days. History of hypertension, myocardial infarction and atrial fibrillation. EXAM: CHEST  2 VIEW COMPARISON:  Radiographs 10/21/2004. FINDINGS: The heart size and mediastinal contours are stable. There is aortic and coronary artery atherosclerosis. The lungs are clear. There is no pleural effusion or pneumothorax.  The bones appear unchanged. IMPRESSION: Stable chest.  No acute cardiopulmonary process. Electronically Signed   By: Richardean Sale M.D.   On: 06/24/2015 12:25    Scheduled Meds: . amLODipine  10 mg Oral Daily  . aspirin EC  81 mg Oral Daily  . finasteride  5 mg Oral Daily  . metoprolol tartrate  12.5 mg Oral BID  . PARoxetine  20 mg Oral Daily  . rosuvastatin  20 mg Oral q1800  . ticagrelor  90 mg Oral BID   Continuous Infusions: . heparin 1,450 Units/hr (06/25/15 [id number])    Active Problems:   TOBACCO ABUSE   Depression   Hypertension   CAD (coronary artery disease)   Atrial fibrillation ([facility])   BPH (benign prostatic  hyperplasia)   Chest pain   Hyperglycemia    Time spent: 35 minutes.     Eugene [name] A  [facility] Pager [phone]. If 7PM-7AM, please contact night-coverage at [email or web address], password [facility] 06/25/2015, 10:15 AM

## 2015-06-25 NOTE — Progress Notes (Addendum)
Eugene [name] is scheduled for tomorrow at 11:30am. (We originally thought schedule was full but turns out he is able to be accomodated.) The patient was made aware. Dayna Dunn PA-C

## 2015-06-26 ENCOUNTER — Encounter (HOSPITAL_COMMUNITY): Payer: Self-pay | Admitting: Cardiovascular Disease

## 2015-06-26 ENCOUNTER — Encounter (HOSPITAL_COMMUNITY)
Admission: EM | Disposition: A | Discharge status: Home / Self Care | Payer: Self-pay | Source: Home / Self Care | Attending: Internal Medicine

## 2015-06-26 DIAGNOSIS — E119 Type 2 diabetes mellitus without complications: Secondary | ICD-10-CM | POA: Insufficient documentation

## 2015-06-26 DIAGNOSIS — I10 Essential (primary) hypertension: Secondary | ICD-10-CM

## 2015-06-26 DIAGNOSIS — I214 Non-ST elevation (NSTEMI) myocardial infarction: Secondary | ICD-10-CM | POA: Insufficient documentation

## 2015-06-26 HISTORY — PX: CARDIAC CATHETERIZATION: SHX172

## 2015-06-26 LAB — BASIC METABOLIC PANEL
Anion gap: 9 (ref 5–15)
BUN: 17 mg/dL (ref 6–20)
CHLORIDE: 107 mmol/L (ref 101–111)
CO2: 26 mmol/L (ref 22–32)
Calcium: 9.1 mg/dL (ref 8.9–10.3)
Creatinine, Ser: 1.16 mg/dL (ref 0.61–1.24)
GFR calc non Af Amer: 59 mL/min — ABNORMAL LOW (ref >60)
Glucose, Bld: 102 mg/dL — ABNORMAL HIGH (ref 65–99)
POTASSIUM: 4.6 mmol/L (ref 3.5–5.1)
SODIUM: 142 mmol/L (ref 135–145)

## 2015-06-26 LAB — CBC
HCT: 46 % (ref 39.0–52.0)
HEMOGLOBIN: 14.8 g/dL (ref 13.0–17.0)
MCH: 29.1 pg (ref 26.0–34.0)
MCHC: 32.2 g/dL (ref 30.0–36.0)
MCV: 90.6 fL (ref 78.0–100.0)
Platelets: 157 10*3/uL (ref 150–400)
RBC: 5.08 MIL/uL (ref 4.22–5.81)
RDW: 14.7 % (ref 11.5–15.5)
WBC: 8.8 10*3/uL (ref 4.0–10.5)

## 2015-06-26 LAB — POCT ACTIVATED CLOTTING TIME: Activated Clotting Time: 379 seconds

## 2015-06-26 LAB — GLUCOSE, CAPILLARY: GLUCOSE-CAPILLARY: 95 mg/dL (ref 65–99)

## 2015-06-26 LAB — HEPARIN LEVEL (UNFRACTIONATED): HEPARIN UNFRACTIONATED: 0.63 [IU]/mL (ref 0.30–0.70)

## 2015-06-26 SURGERY — LEFT HEART CATH AND CORONARY ANGIOGRAPHY

## 2015-06-26 MED ORDER — VERAPAMIL HCL 2.5 MG/ML IV SOLN
INTRAVENOUS | Status: DC | PRN
  Administered 2015-06-26: 12:00:00 via INTRA_ARTERIAL

## 2015-06-26 MED ORDER — SODIUM CHLORIDE 0.9% FLUSH: 3.0000 mL | INTRAVENOUS | Status: DC | PRN

## 2015-06-26 MED ORDER — ASPIRIN 81 MG PO CHEW
81.0000 mg | CHEWABLE_TABLET | ORAL | Status: AC
  Administered 2015-06-26: 81 mg via ORAL
  Filled 2015-06-26: qty 1

## 2015-06-26 MED ORDER — NITROGLYCERIN 1 MG/10 ML FOR IR/CATH LAB
INTRA_ARTERIAL | Status: DC | PRN
  Administered 2015-06-26: 200 ug via INTRACORONARY

## 2015-06-26 MED ORDER — MIDAZOLAM HCL 2 MG/2ML IJ SOLN
INTRAMUSCULAR | Status: AC
  Filled 2015-06-26: qty 2

## 2015-06-26 MED ORDER — HEPARIN (PORCINE) IN NACL 2-0.9 UNIT/ML-% IJ SOLN
INTRAMUSCULAR | Status: AC
  Filled 2015-06-26: qty 1500

## 2015-06-26 MED ORDER — IOHEXOL 350 MG/ML SOLN
INTRAVENOUS | Status: DC | PRN
  Administered 2015-06-26: 120 mL via INTRA_ARTERIAL

## 2015-06-26 MED ORDER — INFLUENZA VAC SPLIT QUAD 0.5 ML IM SUSY
0.5000 mL | PREFILLED_SYRINGE | INTRAMUSCULAR | Status: AC
  Administered 2015-06-27: 14:00:00 0.5 mL via INTRAMUSCULAR
  Filled 2015-06-26 (×2): qty 0.5

## 2015-06-26 MED ORDER — HEPARIN SODIUM (PORCINE) 1000 UNIT/ML IJ SOLN
INTRAMUSCULAR | Status: AC
  Filled 2015-06-26: qty 1

## 2015-06-26 MED ORDER — HEPARIN SODIUM (PORCINE) 1000 UNIT/ML IJ SOLN
INTRAMUSCULAR | Status: DC | PRN
  Administered 2015-06-26: 4500 [IU] via INTRAVENOUS
  Administered 2015-06-26: 5500 [IU] via INTRAVENOUS

## 2015-06-26 MED ORDER — SODIUM CHLORIDE 0.9 % IV SOLN: 250.0000 mL | INTRAVENOUS | Status: DC | PRN

## 2015-06-26 MED ORDER — ASPIRIN EC 81 MG PO TBEC
81.0000 mg | DELAYED_RELEASE_TABLET | Freq: Every day | ORAL | Status: DC
  Administered 2015-06-27: 81 mg via ORAL
  Filled 2015-06-26: qty 1

## 2015-06-26 MED ORDER — ANGIOPLASTY BOOK
Freq: Once | Status: AC
  Administered 2015-06-26: 22:00:00
  Filled 2015-06-26: qty 1

## 2015-06-26 MED ORDER — LIDOCAINE HCL (PF) 1 % IJ SOLN
INTRAMUSCULAR | Status: AC
  Filled 2015-06-26: qty 30

## 2015-06-26 MED ORDER — FENTANYL CITRATE (PF) 100 MCG/2ML IJ SOLN
INTRAMUSCULAR | Status: DC | PRN
  Administered 2015-06-26: 50 ug via INTRAVENOUS
  Administered 2015-06-26: 25 ug via INTRAVENOUS

## 2015-06-26 MED ORDER — NITROGLYCERIN 1 MG/10 ML FOR IR/CATH LAB
INTRA_ARTERIAL | Status: AC
  Filled 2015-06-26: qty 10

## 2015-06-26 MED ORDER — SODIUM CHLORIDE 0.9 % IV SOLN: INTRAVENOUS | Status: AC

## 2015-06-26 MED ORDER — FENTANYL CITRATE (PF) 100 MCG/2ML IJ SOLN
INTRAMUSCULAR | Status: AC
  Filled 2015-06-26: qty 2

## 2015-06-26 MED ORDER — MIDAZOLAM HCL 2 MG/2ML IJ SOLN
INTRAMUSCULAR | Status: DC | PRN
  Administered 2015-06-26: 1 mg via INTRAVENOUS
  Administered 2015-06-26: 2 mg via INTRAVENOUS

## 2015-06-26 MED ORDER — VERAPAMIL HCL 2.5 MG/ML IV SOLN
INTRAVENOUS | Status: AC
  Filled 2015-06-26: qty 2

## 2015-06-26 MED ORDER — LIDOCAINE HCL (PF) 1 % IJ SOLN
INTRAMUSCULAR | Status: DC | PRN
  Administered 2015-06-26: 4 mL via SUBCUTANEOUS

## 2015-06-26 MED ORDER — SODIUM CHLORIDE 0.9% FLUSH: 3.0000 mL | Freq: Two times a day (BID) | INTRAVENOUS | Status: DC

## 2015-06-26 SURGICAL SUPPLY — 24 items
BALLN ANGIOSCULPT RX 2.0X10 (BALLOONS) ×3
BALLN EUPHORA RX 2.0X12 (BALLOONS) ×3
BALLN ~~LOC~~ EUPHORA RX 2.75X12 (BALLOONS) ×3
BALLOON ANGIOSCULPT RX 2.0X10 (BALLOONS) IMPLANT
BALLOON EUPHORA RX 2.0X12 (BALLOONS) IMPLANT
BALLOON ~~LOC~~ EUPHORA RX 2.75X12 (BALLOONS) IMPLANT
CATH INFINITI 5 FR JL3.5 (CATHETERS) ×3 IMPLANT
CATH INFINITI 5FR ANG PIGTAIL (CATHETERS) ×3 IMPLANT
CATH INFINITI 5FR MULTPACK ANG (CATHETERS) IMPLANT
CATH INFINITI JR4 5F (CATHETERS) ×3 IMPLANT
CATH VISTA GUIDE 6FR XBLAD3.5 (CATHETERS) ×1 IMPLANT
DEVICE RAD COMP TR BAND LRG (VASCULAR PRODUCTS) ×3 IMPLANT
GLIDESHEATH SLEND SS 6F .021 (SHEATH) ×3 IMPLANT
KIT ENCORE 26 ADVANTAGE (KITS) ×1 IMPLANT
KIT HEART LEFT (KITS) ×3 IMPLANT
PACK CARDIAC CATHETERIZATION (CUSTOM PROCEDURE TRAY) ×3 IMPLANT
SHEATH PINNACLE 5F 10CM (SHEATH) IMPLANT
STENT PROMUS PREM MR 2.5X16 (Permanent Stent) ×1 IMPLANT
SYR MEDRAD MARK V 150ML (SYRINGE) ×3 IMPLANT
TRANSDUCER W/STOPCOCK (MISCELLANEOUS) ×3 IMPLANT
TUBING CIL FLEX 10 FLL-RA (TUBING) ×3 IMPLANT
WIRE COUGAR XT STRL 190CM (WIRE) ×2 IMPLANT
WIRE EMERALD 3MM-J .035X150CM (WIRE) IMPLANT
WIRE SAFE-T 1.5MM-J .035X260CM (WIRE) ×3 IMPLANT

## 2015-06-26 NOTE — Progress Notes (Signed)
TR BAND REMOVAL  LOCATION:    right radial  DEFLATED PER PROTOCOL:    Yes.    TIME BAND OFF / DRESSING APPLIED:    1730   SITE UPON ARRIVAL:    Level 0  SITE AFTER BAND REMOVAL:    Level 0  CIRCULATION SENSATION AND MOVEMENT:    Within Normal Limits   Yes.    COMMENTS:   Checked frequently remainder of shift with no change in assessment.

## 2015-06-26 NOTE — Progress Notes (Signed)
ANTICOAGULATION CONSULT NOTE - Follow Up Consult  Pharmacy Consult for Heparin Indication: atrial fibrillation  No Known Allergies  Patient Measurements: Height: 6' (182.9 cm) Weight: 195 lb 8 oz (88.678 kg) IBW/kg (Calculated) : 77.6 Heparin Dosing Weight: 88 kg  Vital Signs: Temp: 98.1 F (36.7 C) (02/08 0601) Temp Source: Oral (02/08 0601) BP: 138/69 mmHg (02/08 0601) Pulse Rate: 64 (02/08 0601)  Labs:  Recent Labs  06/24/15 1128  06/24/15 2018 06/24/15 2324 06/25/15 0250 06/25/15 0535 06/25/15 1245 06/25/15 1502 06/26/15 0520  HGB 16.8  --   --   --   --   --   --   --  14.8  HCT 50.2  --   --   --   --   --   --   --  46.0  PLT 164  --   --   --   --   --   --   --  157  LABPROT 19.6*  --   --   --   --   --   --   --   --   INR 1.66*  --   --   --   --   --   --   --   --   HEPARINUNFRC  --   --   --   --   --  0.14*  --  0.40 0.63  CREATININE 1.19  --   --   --   --   --  1.22  --  1.16  CKTOTAL  --   --   --  180  --   --   --   --   --   CKMB  --   --   --  13.2*  --   --   --   --   --   TROPONINI  --   < > 2.85* 3.63* 3.57*  --   --   --   --   < > = values in this interval not displayed.  Estimated Creatinine Clearance: 59.5 mL/min (by C-G formula based on Cr of 1.16).   Assessment: 75 yo [name] on Pradaxa PTA for Afib, now with CP and positive troponins to begin heparin.  Last pradaxa dose 2/6 2 0830.  Heparin level = 0.63 on 1450 units/hr.  Goal of Therapy:  Heparin level 0.3-0.7 units/ml Monitor platelets by anticoagulation protocol: Yes   Plan:  Continue Heparin 1450 units/hr Follow up after cath  [facility], Pharm.D., BCPS Clinical Pharmacist Pager [phone] 06/26/2015 10:39 AM

## 2015-06-26 NOTE — Care Management Note (Signed)
Case Management Note  Patient Details  Name: THEODRIC GARBO MRN: [id number] Date of Birth: 1941-03-28  Subjective/Objective: Made pt aware of his co-pay for Brilinta $37.00. Aware that he can present 30 day free coupon, which was given to him and prescription which he will receive at discharge to Pharmacy for first Rx free and stated co-pay thereafter.No voiced questions or concerns although CM aware pt is distracted by pending procedure today.                    Action/Plan: CM will be available for any additional CM needs.    Expected Discharge Date:                  Expected Discharge Plan:     In-House Referral:     Discharge planning Services     Post Acute Care Choice:    Choice offered to:     DME Arranged:    DME Agency:     HH Arranged:    [place] Agency:     Status of Service:     Medicare Important Message Given:    Date Medicare IM Given:    Medicare IM give by:    Date Additional Medicare IM Given:    Additional Medicare Important Message give by:     If discussed at [place] of Stay Meetings, dates discussed:    Additional Comments:  Delrae Sawyers, RN 06/26/2015, 10:18 AM

## 2015-06-26 NOTE — H&P (View-Only) (Signed)
    Subjective:  Denies CP or dyspnea   Objective:  Filed Vitals:   06/25/15 0536 06/25/15 1403 06/25/15 2103 06/26/15 0601  BP: 114/79 97/62 118/63 138/69  Pulse: 57 57 65 64  Temp: 98.2 F (36.8 C) 97.7 F (36.5 C) 98.7 F (37.1 C) 98.1 F (36.7 C)  TempSrc: Oral Oral Oral Oral  Resp: 18 18 18 17   Height:      Weight:      SpO2: 99% 95% 96% 98%    Intake/Output from previous day: No intake or output data in the 24 hours ending 06/26/15 0901  Physical Exam: Physical exam: Well-developed well-nourished in no acute distress.  Skin is warm and dry.  HEENT is normal.  Neck is supple.  Chest exp wheeze Cardiovascular exam is irregular Abdominal exam nontender or distended. No masses palpated. Extremities show no edema. neuro grossly intact    Lab Results: Basic Metabolic Panel:  Recent Labs  06/25/15 1245 06/26/15 0520  NA 138 142  K 4.5 4.6  CL 106 107  CO2 23 26  GLUCOSE 187* 102*  BUN 17 17  CREATININE 1.22 1.16  CALCIUM 8.9 9.1   CBC:  Recent Labs  06/24/15 1128 06/26/15 0520  WBC 7.8 8.8  HGB 16.8 14.8  HCT 50.2 46.0  MCV 90.9 90.6  PLT 164 157   Cardiac Enzymes:  Recent Labs  06/24/15 2018 06/24/15 2324 06/25/15 0250  CKTOTAL  --  180  --   CKMB  --  13.2*  --   TROPONINI 2.85* 3.63* 3.57*     Assessment/Plan:  1 non-ST elevation myocardial infarction-patient has ruled in. He remains pain-free. Continue aspirin, heparin and statin. Metoprolol DCed due to bradycardai. He was given brilinta by cardiology fellow; will continue. Pradaxa on hold. Proceed with cardiac catheterization today. The risks and benefits were discussed and the patient agrees to proceed. Echo shows EF 45-50. 2 permanent atrial fibrillation-transient bradycardia overnight; no symptoms; continue to hold metoprolol. Resume pradaxa following procedures. 3 tobacco abuse-patient counseled on discontinuing. 4 Hyperlipidemia-continue statin.  Kirk Ruths 06/26/2015,  9:01 AM

## 2015-06-26 NOTE — Progress Notes (Signed)
[facility] PROGRESS NOTE  Eugene [name] [id number] DOB: Feb 18, 1941 DOA: 06/24/2015 PCP: Odette Fraction, MD  Assessment/Plan: Eugene [name] is a 75 y.o. male weith a one week history of chest pain with mild exertion. Pain sharp, relieved with Pradaxa which he takes twice daily. Also "holding" his chest helps. Pain usually lasts an hour except today it lasted three. Pain does not radiate. It is not affected by deep breaths or cough. No associated nausea or diaphoresis. Not compliant with daily aspirin though he took one today at onset of CP. No shortness of breath.   NSTEMI;  Troponin peaked at 3.6 He was treated with  heparin Gtt initial on admission, then brilinta Continue with Crestor.  Hold metoprolol due to bradycardia ( more bradycardia during sleep, down to the 30's) S/p cath with DES x1 to mid LAD and cutting ballon angioplasty to ostium diagnoal brach on 2/8, appreciate cards input, will need asa/brilinta x67yr, statin.   A fib; chronic afib with bradycardia hold pradaxa ,  Holding metoprolol due to bradycardia.   HTN; continue with Norvasc.   CAD; on medical tx, as above  New diagnosis Diabetes; Hb=-A1c at 7.3. On ssi, change diet to heart health and carb modified. Consider metformin at discharge   Code Status: Partial Code. No intubation. Family Communication: care discussed with patient  Disposition Plan: remain inpatient for treatment of N-STEMI,. Probably home at time of discharge   Consultants:  Cardiology   Procedures:  Cardiac cath on 2/8 with DES  Antibiotics:  none  HPI/Subjective: Returned from cardiac cath, Chest pain free. Sitting on edge of the bed, does has bradycardia, more during sleep.  Objective: Filed Vitals:   06/26/15 1604 06/26/15 1700  BP: 150/92 143/58  Pulse: 70 59  Temp: 98 F (36.7 C)   Resp: 17 10   No intake or output data in the 24 hours ending 06/26/15 1900 Filed Weights   06/24/15 1116  06/24/15 1740  Weight: 90.266 kg (199 lb) 88.678 kg (195 lb 8 oz)    Exam:   General:  NAD  Cardiovascular: IRRR  Respiratory: CTA  Abdomen: BS present, soft, nt  Musculoskeletal: no edema  Data Reviewed: Basic Metabolic Panel:  Recent Labs Lab 06/24/15 1128 06/25/15 1245 06/26/15 0520  NA 140 138 142  K 4.7 4.5 4.6  CL 102 106 107  CO2 27 23 26   GLUCOSE 179* 187* 102*  BUN 19 17 17   CREATININE 1.19 1.22 1.16  CALCIUM 9.4 8.9 9.1   Liver Function Tests: No results for input(s): AST, ALT, ALKPHOS, BILITOT, PROT, ALBUMIN in the last 168 hours. No results for input(s): LIPASE, AMYLASE in the last 168 hours. No results for input(s): AMMONIA in the last 168 hours. CBC:  Recent Labs Lab 06/24/15 1128 06/26/15 0520  WBC 7.8 8.8  HGB 16.8 14.8  HCT 50.2 46.0  MCV 90.9 90.6  PLT 164 157   Cardiac Enzymes:  Recent Labs Lab 06/24/15 1800 06/24/15 2018 06/24/15 2324 06/25/15 0250  CKTOTAL  --   --  180  --   CKMB  --   --  13.2*  --   TROPONINI 1.62* 2.85* 3.63* 3.57*   BNP (last 3 results) No results for input(s): BNP in the last 8760 hours.  ProBNP (last 3 results) No results for input(s): PROBNP in the last 8760 hours.  CBG:  Recent Labs Lab 06/25/15 1602  GLUCAP 127*    No results found for this or any previous visit (from  the past 240 hour(s)).   Studies: No results found.  Scheduled Meds: . amLODipine  10 mg Oral Daily  . [START ON 06/27/2015] aspirin EC  81 mg Oral Daily  . finasteride  5 mg Oral Daily  . insulin aspart  0-9 Units Subcutaneous TID WC  . PARoxetine  20 mg Oral Daily  . rosuvastatin  20 mg Oral q1800  . sodium chloride flush  3 mL Intravenous Q12H  . ticagrelor  90 mg Oral BID   Continuous Infusions: . sodium chloride 75 mL/hr (06/26/15 1424)    Active Problems:   TOBACCO ABUSE   Depression   Hypertension   CAD (coronary artery disease)   Atrial fibrillation (HCC)   BPH (benign prostatic hyperplasia)   Chest  pain   Hyperglycemia   NSTEMI (non-ST elevated myocardial infarction) ([place])    Time spent: 25 minutes.     Eugene Legore MD PhD  [facility] Pager [phone] If 7PM-7AM, please contact night-coverage at [email or web address], password [facility] 06/26/2015, 7:00 PM  LOS: 1 day

## 2015-06-26 NOTE — Progress Notes (Signed)
    Subjective:  Denies CP or dyspnea   Objective:  Filed Vitals:   06/25/15 0536 06/25/15 1403 06/25/15 2103 06/26/15 0601  BP: 114/79 97/62 118/63 138/69  Pulse: 57 57 65 64  Temp: 98.2 F (36.8 C) 97.7 F (36.5 C) 98.7 F (37.1 C) 98.1 F (36.7 C)  TempSrc: Oral Oral Oral Oral  Resp: 18 18 18 17   Height:      Weight:      SpO2: 99% 95% 96% 98%    Intake/Output from previous day: No intake or output data in the 24 hours ending 06/26/15 0901  Physical Exam: Physical exam: Well-developed well-nourished in no acute distress.  Skin is warm and dry.  HEENT is normal.  Neck is supple.  Chest exp wheeze Cardiovascular exam is irregular Abdominal exam nontender or distended. No masses palpated. Extremities show no edema. neuro grossly intact    Lab Results: Basic Metabolic Panel:  Recent Labs  06/25/15 1245 06/26/15 0520  NA 138 142  K 4.5 4.6  CL 106 107  CO2 23 26  GLUCOSE 187* 102*  BUN 17 17  CREATININE 1.22 1.16  CALCIUM 8.9 9.1   CBC:  Recent Labs  06/24/15 1128 06/26/15 0520  WBC 7.8 8.8  HGB 16.8 14.8  HCT 50.2 46.0  MCV 90.9 90.6  PLT 164 157   Cardiac Enzymes:  Recent Labs  06/24/15 2018 06/24/15 2324 06/25/15 0250  CKTOTAL  --  180  --   CKMB  --  13.2*  --   TROPONINI 2.85* 3.63* 3.57*     Assessment/Plan:  1 non-ST elevation myocardial infarction-patient has ruled in. He remains pain-free. Continue aspirin, heparin and statin. Metoprolol DCed due to bradycardai. He was given brilinta by cardiology fellow; will continue. Pradaxa on hold. Proceed with cardiac catheterization today. The risks and benefits were discussed and the patient agrees to proceed. Echo shows EF 45-50. 2 permanent atrial fibrillation-transient bradycardia overnight; no symptoms; continue to hold metoprolol. Resume pradaxa following procedures. 3 tobacco abuse-patient counseled on discontinuing. 4 Hyperlipidemia-continue statin.  Kirk Ruths 06/26/2015,  9:01 AM

## 2015-06-26 NOTE — Interval H&P Note (Signed)
History and Physical Interval Note:  06/26/2015 11:19 AM  Eugene [name]  has presented today for cardiac cath with the diagnosis of NSTEMI. The various methods of treatment have been discussed with the patient and family. After consideration of risks, benefits and other options for treatment, the patient has consented to  Procedure(s): Left Heart Cath and Coronary Angiography (N/A) as a surgical intervention .  The patient's history has been reviewed, patient examined, no change in status, stable for surgery.  I have reviewed the patient's chart and labs.  Questions were answered to the patient's satisfaction.   Cath Lab Visit (complete for each Cath Lab visit)  Clinical Evaluation Leading to the Procedure:   ACS: Yes.    Non-ACS:    Anginal Classification: CCS III  Anti-ischemic medical therapy: Maximal Therapy (2 or more classes of medications)  Non-Invasive Test Results: No non-invasive testing performed  Prior CABG: No previous CABG          MCALHANY,CHRISTOPHER

## 2015-06-27 DIAGNOSIS — I482 Chronic atrial fibrillation: Secondary | ICD-10-CM | POA: Diagnosis not present

## 2015-06-27 DIAGNOSIS — R001 Bradycardia, unspecified: Secondary | ICD-10-CM | POA: Diagnosis not present

## 2015-06-27 DIAGNOSIS — Z23 Encounter for immunization: Secondary | ICD-10-CM | POA: Diagnosis not present

## 2015-06-27 DIAGNOSIS — N400 Enlarged prostate without lower urinary tract symptoms: Secondary | ICD-10-CM | POA: Diagnosis not present

## 2015-06-27 DIAGNOSIS — F329 Major depressive disorder, single episode, unspecified: Secondary | ICD-10-CM | POA: Diagnosis not present

## 2015-06-27 DIAGNOSIS — I214 Non-ST elevation (NSTEMI) myocardial infarction: Secondary | ICD-10-CM | POA: Diagnosis not present

## 2015-06-27 DIAGNOSIS — I2510 Atherosclerotic heart disease of native coronary artery without angina pectoris: Secondary | ICD-10-CM | POA: Diagnosis not present

## 2015-06-27 DIAGNOSIS — E119 Type 2 diabetes mellitus without complications: Secondary | ICD-10-CM | POA: Diagnosis not present

## 2015-06-27 DIAGNOSIS — I10 Essential (primary) hypertension: Secondary | ICD-10-CM | POA: Diagnosis not present

## 2015-06-27 DIAGNOSIS — F17200 Nicotine dependence, unspecified, uncomplicated: Secondary | ICD-10-CM | POA: Diagnosis not present

## 2015-06-27 LAB — BASIC METABOLIC PANEL
ANION GAP: 13 (ref 5–15)
BUN: 13 mg/dL (ref 6–20)
CALCIUM: 9.5 mg/dL (ref 8.9–10.3)
CHLORIDE: 103 mmol/L (ref 101–111)
CO2: 26 mmol/L (ref 22–32)
Creatinine, Ser: 1.12 mg/dL (ref 0.61–1.24)
Glucose, Bld: 110 mg/dL — ABNORMAL HIGH (ref 65–99)
POTASSIUM: 4.9 mmol/L (ref 3.5–5.1)
Sodium: 142 mmol/L (ref 135–145)

## 2015-06-27 LAB — CBC
HEMATOCRIT: 47.8 % (ref 39.0–52.0)
HEMOGLOBIN: 15.9 g/dL (ref 13.0–17.0)
MCH: 30.2 pg (ref 26.0–34.0)
MCHC: 33.3 g/dL (ref 30.0–36.0)
MCV: 90.7 fL (ref 78.0–100.0)
Platelets: 152 10*3/uL (ref 150–400)
RBC: 5.27 MIL/uL (ref 4.22–5.81)
RDW: 14.8 % (ref 11.5–15.5)
WBC: 9.9 10*3/uL (ref 4.0–10.5)

## 2015-06-27 LAB — MAGNESIUM: MAGNESIUM: 2.1 mg/dL (ref 1.7–2.4)

## 2015-06-27 LAB — TSH: TSH: 2.469 u[IU]/mL (ref 0.350–4.500)

## 2015-06-27 LAB — GLUCOSE, CAPILLARY: GLUCOSE-CAPILLARY: 114 mg/dL — AB (ref 65–99)

## 2015-06-27 MED ORDER — CLOPIDOGREL BISULFATE 75 MG PO TABS
600.0000 mg | ORAL_TABLET | Freq: Once | ORAL | Status: AC
  Administered 2015-06-27: 12:00:00 600 mg via ORAL
  Filled 2015-06-27: qty 8

## 2015-06-27 MED ORDER — METFORMIN HCL 500 MG PO TABS
500.0000 mg | ORAL_TABLET | Freq: Two times a day (BID) | ORAL | Status: DC
Start: 2015-06-27 — End: 2015-06-27

## 2015-06-27 MED ORDER — CLOPIDOGREL BISULFATE 75 MG PO TABS: 75.0000 mg | ORAL_TABLET | Freq: Every day | ORAL | Status: DC

## 2015-06-27 MED ORDER — METFORMIN HCL 500 MG PO TABS: 500.0000 mg | ORAL_TABLET | Freq: Two times a day (BID) | ORAL | Status: DC

## 2015-06-27 MED ORDER — METFORMIN HCL 500 MG PO TABS
500.0000 mg | ORAL_TABLET | Freq: Two times a day (BID) | ORAL | Status: DC
Start: 2015-06-27 — End: 2015-07-04

## 2015-06-27 MED FILL — Heparin Sodium (Porcine) 2 Unit/ML in Sodium Chloride 0.9%: INTRAMUSCULAR | Qty: 500 | Status: AC

## 2015-06-27 NOTE — Discharge Summary (Signed)
Discharge Summary  Eugene [name] [id number] DOB: [birth year]  PCP: Odette Fraction, MD  Admit date: 06/24/2015 Discharge date: 06/27/2015  Time spent: <87mins  Recommendations for Outpatient Follow-up:  1. F/u with PMD within two weeks for hospital discharge follow up, started metformin at  Discharge, pmd to continue monitor blood sugar control and diet modification 2. F/u with cardiology in three weeks, s/p DES stent, started on asa/plaivx/pradaxa, lopressor d/ced due to bradycardia.  Discharge Diagnoses:  Active Hospital Problems   Diagnosis Date Noted  . NSTEMI (non-ST elevated myocardial infarction) ([place])   . Diabetes mellitus type 2, noninsulin dependent ([place])   . Chest pain 06/24/2015  . Hyperglycemia 06/24/2015  . Atrial fibrillation ([place]) 08/23/2009  . TOBACCO ABUSE 04/10/2008  . CAD (coronary artery disease) 06/22/2006  . BPH (benign prostatic hyperplasia) 06/22/2006  . Depression 06/22/2006  . Hypertension 06/22/2006    Resolved Hospital Problems   Diagnosis Date Noted Date Resolved  No resolved problems to display.    Discharge Condition: stable  Diet recommendation: heart healthy/carb modified  Filed Weights   06/24/15 1116 06/24/15 1740 06/27/15 0134  Weight: 90.266 kg (199 lb) 88.678 kg (195 lb 8 oz) 88.6 kg (195 lb 5.2 oz)    History of present illness: ( per admitting MD MERRELL, DAVID J, MD and Tye Savoy PA) Eugene [name] is a 75 y.o. male weith a one week history of chest pain with mild exertion. Pain sharp, relieved with Pradaxa which he takes twice daily. Also "holding" his chest helps. Pain usually lasts an hour except today it lasted three. Pain does not radiate. It is not affected by deep breaths or cough. No associated nausea or diaphoresis. Not compliant with daily aspirin though he took one today at onset of CP. No shortness of breath.  Hospital Course:  Active Problems:   TOBACCO ABUSE   Depression   Hypertension  CAD (coronary artery disease)   Atrial fibrillation (HCC)   BPH (benign prostatic hyperplasia)   Chest pain   Hyperglycemia   NSTEMI (non-ST elevated myocardial infarction) ([place])   Diabetes mellitus type 2, noninsulin dependent ([place])  NSTEMI;  Troponin peaked at 3.6 He was treated with heparin Gtt initial on admission, then brilinta briefly Continue with Crestor.  Hold metoprolol due to bradycardia ( more bradycardia during sleep, down to the 30's) S/p cath with DES x1 to mid LAD and cutting ballon angioplasty to ostium diagnoal brach on 2/8, appreciate cards input, cards advised to start on asa 81/plavix/pradaxa at discharge, then d/c asa in a month and continue plavix and pradaxa. Patient is referred to cardiac rehab, he is advised to quit smoking and continue statin.   A fib; chronic afib with bradycardia pradaxa held initially, resumed at discharge,  metoprolol held  due to bradycardia.   HTN; continue with Norvasc.   CAD; on medical tx, as above  New diagnosis Diabetes; Hb=-A1c at 7.3. On ssi in the hospital, change diet to heart health and carb modified. started metformin at discharge. pmd to continue monitor blood sugar control   Code Status: Partial Code. No intubation. Family Communication: care discussed with patient and his brother who is in the room Disposition Plan: d/c home on 2/9   Consultants:  Cardiology  Procedures:  Cardiac cath on 2/8 with DES  Antibiotics:  none  Discharge Exam: BP 120/58 mmHg  Pulse 85  Temp(Src) 98 F (36.7 C) (Oral)  Resp 15  Ht 6' (1.829 m)  Wt 88.6 kg (195 lb 5.2  oz)  BMI 26.49 kg/m2  SpO2 97%    General: NAD  Cardiovascular: IRRR, chronic afib   Respiratory: CTA  Abdomen: BS present, soft, nt  Musculoskeletal: no edema   Discharge Instructions You were cared for by a hospitalist during your hospital stay. If you have any questions about your discharge medications or the care you received while you  were in the hospital after you are discharged, you can call the unit and asked to speak with the hospitalist on call if the hospitalist that took care of you is not available. Once you are discharged, your primary care physician will handle any further medical issues. Please note that NO REFILLS for any discharge medications will be authorized once you are discharged, as it is imperative that you return to your primary care physician (or establish a relationship with a primary care physician if you do not have one) for your aftercare needs so that they can reassess your need for medications and monitor your lab values.      Discharge Instructions    Amb Referral to Cardiac Rehabilitation    Complete by:  As directed   Diagnosis:   Myocardial Infarction PCI       Ambulatory referral to Nutrition and Diabetic Education    Complete by:  As directed      Diet - low sodium heart healthy    Complete by:  As directed   Low fat, carb modified     Discharge instructions    Complete by:  As directed   Advise to quit smoking     Increase activity slowly    Complete by:  As directed             Medication List    STOP taking these medications        metoprolol tartrate 25 MG tablet  Commonly known as:  LOPRESSOR      TAKE these medications        amLODipine 10 MG tablet  Commonly known as:  NORVASC  TAKE 1 BY MOUTH DAILY     aspirin 81 MG tablet  Take 81 mg by mouth daily.     clopidogrel 75 MG tablet  Commonly known as:  PLAVIX  Take 1 tablet (75 mg total) by mouth daily.  Start taking on:  06/28/2015     finasteride 5 MG tablet  Commonly known as:  PROSCAR  TAKE 1 BY MOUTH DAILY     metFORMIN 500 MG tablet  Commonly known as:  GLUCOPHAGE  Take 1 tablet (500 mg total) by mouth 2 (two) times daily with a meal.     PARoxetine 20 MG tablet  Commonly known as:  PAXIL  Take 1 tablet (20 mg total) by mouth daily.     PRADAXA 150 MG Caps capsule  Generic drug:  dabigatran    TAKE 1 BY MOUTH TWICE DAILY     rosuvastatin 20 MG tablet  Commonly known as:  CRESTOR  TAKE 1 BY MOUTH DAILY NO FURTHER REFILLS WITHOUT FOLLOW UP LAB WORK       No Known Allergies Follow-up Information    Follow up with Jenkins Rouge, MD In 3 weeks.   Specialty:  Cardiology   Why:  cad s/p stent, hospital discharge follow up   Contact information:   [address] [place] [address] [phone]       Follow up with [facility] TOM, MD In 1 week.   Specialty:  Family Medicine  Why:  hospital discharge follow up, pmd to monitor blood sugar control and diet education   Contact information:   [address] [place] [address] [phone]        The results of significant diagnostics from this hospitalization (including imaging, microbiology, ancillary and laboratory) are listed below for reference.    Significant Diagnostic Studies: Dg Chest 2 View  06/24/2015  CLINICAL DATA:  Left-sided chest pain for several days. History of hypertension, myocardial infarction and atrial fibrillation. EXAM: CHEST  2 VIEW COMPARISON:  Radiographs 10/21/2004. FINDINGS: The heart size and mediastinal contours are stable. There is aortic and coronary artery atherosclerosis. The lungs are clear. There is no pleural effusion or pneumothorax. The bones appear unchanged. IMPRESSION: Stable chest.  No acute cardiopulmonary process. Electronically Signed   By: Richardean Sale M.D.   On: 06/24/2015 12:25    Microbiology: No results found for this or any previous visit (from the past 240 hour(s)).   Labs: Basic Metabolic Panel:  Recent Labs Lab 06/24/15 1128 06/25/15 1245 06/26/15 0520 06/27/15 0547  NA 140 138 142 142  K 4.7 4.5 4.6 4.9  CL 102 106 107 103  CO2 27 23 26 26   GLUCOSE 179* 187* 102* 110*  BUN 19 17 17 13   CREATININE 1.19 1.22 1.16 1.12  CALCIUM 9.4 8.9 9.1 9.5  MG  --   --   --  2.1   Liver Function Tests: No results for input(s): AST,  ALT, ALKPHOS, BILITOT, PROT, ALBUMIN in the last 168 hours. No results for input(s): LIPASE, AMYLASE in the last 168 hours. No results for input(s): AMMONIA in the last 168 hours. CBC:  Recent Labs Lab 06/24/15 1128 06/26/15 0520 06/27/15 0547  WBC 7.8 8.8 9.9  HGB 16.8 14.8 15.9  HCT 50.2 46.0 47.8  MCV 90.9 90.6 90.7  PLT 164 157 152   Cardiac Enzymes:  Recent Labs Lab 06/24/15 1800 06/24/15 2018 06/24/15 2324 06/25/15 0250  CKTOTAL  --   --  180  --   CKMB  --   --  13.2*  --   TROPONINI 1.62* 2.85* 3.63* 3.57*   BNP: BNP (last 3 results) No results for input(s): BNP in the last 8760 hours.  ProBNP (last 3 results) No results for input(s): PROBNP in the last 8760 hours.  CBG:  Recent Labs Lab 06/25/15 1602 06/26/15 2149 06/27/15 0650  GLUCAP 127* 95 114*       SignedFlorencia Reasons MD, PhD  [facility] 06/27/2015, 12:59 PM

## 2015-06-27 NOTE — Progress Notes (Signed)
    Subjective:  Denies CP or dyspnea   Objective:  Filed Vitals:   06/26/15 1800 06/26/15 2041 06/27/15 0134 06/27/15 0803  BP: 145/70 149/97 162/83 120/58  Pulse: 33 67 62 85  Temp:  98 F (36.7 C) 98.8 F (37.1 C) 98 F (36.7 C)  TempSrc:  Oral Oral Oral  Resp: 13 20 17 15   Height:      Weight:   195 lb 5.2 oz (88.6 kg)   SpO2: 100% 95% 97% 97%    Intake/Output from previous day:  Intake/Output Summary (Last 24 hours) at 06/27/15 [id number] Last data filed at 06/26/15 2235  Gross per 24 hour  Intake  697.5 ml  Output      0 ml  Net  697.5 ml    Physical Exam: Physical exam: Well-developed well-nourished in no acute distress.  Skin is warm and dry.  HEENT is normal.  Neck is supple.  Chest exp wheeze Cardiovascular exam is irregular Abdominal exam nontender or distended. No masses palpated. Extremities show no edema. Radial cath site with no hematoma neuro grossly intact    Lab Results: Basic Metabolic Panel:  Recent Labs  06/26/15 0520 06/27/15 0547  NA 142 142  K 4.6 4.9  CL 107 103  CO2 26 26  GLUCOSE 102* 110*  BUN 17 13  CREATININE 1.16 1.12  CALCIUM 9.1 9.5  MG  --  2.1   CBC:  Recent Labs  06/26/15 0520 06/27/15 0547  WBC 8.8 9.9  HGB 14.8 15.9  HCT 46.0 47.8  MCV 90.6 90.7  PLT 157 152   Cardiac Enzymes:  Recent Labs  06/24/15 2018 06/24/15 2324 06/25/15 0250  CKTOTAL  --  180  --   CKMB  --  13.2*  --   TROPONINI 2.85* 3.63* 3.57*     Assessment/Plan:  1 non-ST elevation myocardial infarction-s/p PCI of LAD with DES and PCI of diagonal. Continue aspirin and statin. Metoprolol DCed due to bradycardai. Given need for anticoagulation for atrial fibrillation (pradaxa), will DC brilinta; load with plavix 600 mg x 1 and then 75 mg daily thereafter. Continue ASA 81 mg daily, plavix and pradaxa for one month; then DC asa and continue plavix and pradaxa long term. Echo shows EF 45-50. 2 permanent atrial fibrillation-transient  bradycardia overnight; no symptoms; continue to hold metoprolol. Resume pradaxa. 3 tobacco abuse-patient counseled on discontinuing. 4 Hyperlipidemia-continue statin. FU Dr Johnsie Cancel following DC Kirk Ruths 06/27/2015, 9:27 AM

## 2015-06-27 NOTE — Progress Notes (Signed)
CARDIAC REHAB PHASE I   PRE:  Rate/Rhythm: 61-70 afib  BP:  Supine: 145/98  Sitting:   Standing:    SaO2:   MODE:  Ambulation: 460 ft   POST:  Rate/Rhythm: 92 afib  BP:  Supine:   Sitting: 165/80  Standing:    SaO2:  [id number] Pt walked 460 ft with steady gait. No CP. Does not walk a lot due to back issues. MI education completed with pt who seemed to get overwhelmed. Discussed that his A1C was 7.3 and that he needed to watch carbs. Discussed some healthier food choices and pt stated what is the use of living if you cannot eat what you want. Tried to encourage little changes so as to not overwhelm. Discussed CRP 2 and referring to [place]. Discussed how they could help with diet also. Discussed smoking cessation and offered fake cigarette. Gave smoking cessation handout. Pt stated not going to quit. Reviewed purpose of antiplatelet. Asked pt if he would ever harm self as he stated what was use of living with having to make changes and he stated no. Discussed with RN his remark. Emotional support given.   Graylon Good, RN BSN  06/27/2015 9:49 AM

## 2015-06-27 NOTE — Discharge Instructions (Addendum)
Information on my medicine - Pradaxa (dabigatran)  This medication education was reviewed with me or my healthcare representative as part of my discharge preparation.  The pharmacist that spoke with me during my hospital stay was:  Lavenia Atlas, [facility]  Why was Pradaxa prescribed for you? Pradaxa was prescribed for you to reduce the risk of forming blood clots that cause a stroke if you have a medical condition called atrial fibrillation (a type of irregular heartbeat).    What do you Need to know about PradAXa? Take your Pradaxa TWICE DAILY - one capsule in the morning and one tablet in the evening with or without food.  It would be best to take the doses about the same time each day.  The capsules should not be broken, chewed or opened - they must be swallowed whole.  Do not store Pradaxa in other medication containers - once the bottle is opened the Pradaxa should be used within FOUR months; throw away any capsules that havent been by that time.  Take Pradaxa exactly as prescribed by your doctor.  DO NOT stop taking Pradaxa without talking to the doctor who prescribed the medication.  Stopping without other stroke prevention medication to take the place of Pradaxa may increase your risk of developing a clot that causes a stroke.  Refill your prescription before you run out.  After discharge, you should have regular check-up appointments with your healthcare provider that is prescribing your Pradaxa.  In the future your dose may need to be changed if your kidney function or weight changes by a significant amount.  What do you do if you miss a dose? If you miss a dose, take it as soon as you remember on the same day.  If your next dose is less than 6 hours away, skip the missed dose.  Do not take two doses of PRADAXA at the same time.  Important Safety Information A possible side effect of Pradaxa is bleeding. You should call your healthcare provider right away if you  experience any of the following: ? Bleeding from an injury or your nose that does not stop. ? Unusual colored urine (red or dark brown) or unusual colored stools (red or black). ? Unusual bruising for unknown reasons. ? A serious fall or if you hit your head (even if there is no bleeding).  Some medicines may interact with Pradaxa and might increase your risk of bleeding or clotting while on Pradaxa. To help avoid this, consult your healthcare provider or pharmacist prior to using any new prescription or non-prescription medications, including herbals, vitamins, non-steroidal anti-inflammatory drugs (NSAIDs) and supplements.  This website has more information on Pradaxa (dabigatran): https://www.pradaxa.com

## 2015-06-27 NOTE — Care Management Note (Signed)
Case Management Note  Patient Details  Name: Eugene [name] MRN: [id number] Date of Birth: 1940-12-15  Subjective/Objective:     Patient is from home alone, pta indep, he will be dc on pradaxa which he is already taking at home and plavix which is $4 for the generic, Per [place].  No needs.               Action/Plan:   Expected Discharge Date:                  Expected Discharge Plan:  Home/Self Care  In-House Referral:     Discharge planning Services  CM Consult, Medication Assistance  Post Acute Care Choice:    Choice offered to:     DME Arranged:    DME Agency:     HH Arranged:    HH Agency:     Status of Service:  Completed, signed off  Medicare Important Message Given:    Date Medicare IM Given:    Medicare IM give by:    Date Additional Medicare IM Given:    Additional Medicare Important Message give by:     If discussed at [place] of Stay Meetings, dates discussed:    Additional Comments:  Zenon Mayo, RN 06/27/2015, 10:15 AM

## 2015-06-28 ENCOUNTER — Telehealth: Payer: Self-pay | Admitting: Family Medicine

## 2015-06-28 NOTE — Telephone Encounter (Signed)
Pt had questions about the medication for his prostate medication. In lew of his recent hospitalization - recommendations for a f/u visit was suggested and pt made appt and will discuss his medications at that time.

## 2015-06-28 NOTE — Telephone Encounter (Signed)
PATIENT CALLING TO SPEAK WITH YOU REGARDING HIS MEDICATION AFTER HAVING A HEART ATTACK LAST WEEK  [phone]

## 2015-07-04 ENCOUNTER — Ambulatory Visit (INDEPENDENT_AMBULATORY_CARE_PROVIDER_SITE_OTHER): Payer: Medicare Other | Admitting: Family Medicine

## 2015-07-04 ENCOUNTER — Encounter: Payer: Self-pay | Admitting: Family Medicine

## 2015-07-04 VITALS — BP 136/84 | HR 52 | Temp 98.1°F | Resp 16 | Ht 72.0 in | Wt 193.0 lb

## 2015-07-04 DIAGNOSIS — I482 Chronic atrial fibrillation, unspecified: Secondary | ICD-10-CM

## 2015-07-04 DIAGNOSIS — Z09 Encounter for follow-up examination after completed treatment for conditions other than malignant neoplasm: Secondary | ICD-10-CM | POA: Diagnosis not present

## 2015-07-04 DIAGNOSIS — I10 Essential (primary) hypertension: Secondary | ICD-10-CM

## 2015-07-04 DIAGNOSIS — E1100 Type 2 diabetes mellitus with hyperosmolarity without nonketotic hyperglycemic-hyperosmolar coma (NKHHC): Secondary | ICD-10-CM

## 2015-07-04 DIAGNOSIS — I2510 Atherosclerotic heart disease of native coronary artery without angina pectoris: Secondary | ICD-10-CM

## 2015-07-04 LAB — COMPLETE METABOLIC PANEL WITH GFR
ALBUMIN: 4 g/dL (ref 3.6–5.1)
ALK PHOS: 66 U/L (ref 40–115)
ALT: 14 U/L (ref 9–46)
AST: 13 U/L (ref 10–35)
BILIRUBIN TOTAL: 0.6 mg/dL (ref 0.2–1.2)
BUN: 18 mg/dL (ref 7–25)
CALCIUM: 9 mg/dL (ref 8.6–10.3)
CHLORIDE: 108 mmol/L (ref 98–110)
CO2: 26 mmol/L (ref 20–31)
CREATININE: 1.11 mg/dL (ref 0.70–1.18)
GFR, EST AFRICAN AMERICAN: 74 mL/min (ref >=60)
GFR, Est Non African American: 64 mL/min (ref >=60)
Glucose, Bld: 83 mg/dL (ref 70–99)
Potassium: 5 mmol/L (ref 3.5–5.3)
Sodium: 139 mmol/L (ref 135–146)
TOTAL PROTEIN: 6.6 g/dL (ref 6.1–8.1)

## 2015-07-04 LAB — CBC WITH DIFFERENTIAL/PLATELET
BASOS ABS: 0 10*3/uL (ref 0.0–0.1)
Basophils Relative: 0 % (ref 0–1)
EOS PCT: 3 % (ref 0–5)
Eosinophils Absolute: 0.3 10*3/uL (ref 0.0–0.7)
HEMATOCRIT: 48.5 % (ref 39.0–52.0)
HEMOGLOBIN: 16 g/dL (ref 13.0–17.0)
LYMPHS ABS: 2.7 10*3/uL (ref 0.7–4.0)
LYMPHS PCT: 32 % (ref 12–46)
MCH: 29.9 pg (ref 26.0–34.0)
MCHC: 33 g/dL (ref 30.0–36.0)
MCV: 90.5 fL (ref 78.0–100.0)
MPV: 9.4 fL (ref 8.6–12.4)
Monocytes Absolute: 1 10*3/uL (ref 0.1–1.0)
Monocytes Relative: 12 % (ref 3–12)
NEUTROS ABS: 4.5 10*3/uL (ref 1.7–7.7)
Neutrophils Relative %: 53 % (ref 43–77)
Platelets: 186 10*3/uL (ref 150–400)
RBC: 5.36 MIL/uL (ref 4.22–5.81)
RDW: 15.2 % (ref 11.5–15.5)
WBC: 8.4 10*3/uL (ref 4.0–10.5)

## 2015-07-04 MED ORDER — METFORMIN HCL 500 MG PO TABS: 500.0000 mg | ORAL_TABLET | Freq: Two times a day (BID) | ORAL | Status: DC

## 2015-07-04 NOTE — Addendum Note (Signed)
Addended by: Shary Decamp B on: 07/04/2015 12:04 PM   Modules accepted: Orders

## 2015-07-04 NOTE — Progress Notes (Signed)
Subjective:    Patient ID: Eugene [name], male    DOB: [birth year], 75 y.o.   MRN: [id number]  HPI Recently admitted to hospital.  I have copied relevant portions of the discharge summary and included them below: Admit date: 06/24/2015 Discharge date: 06/27/2015  Time spent: <68mins  Recommendations for Outpatient Follow-up:  1. F/u with PMD within two weeks for hospital discharge follow up, started metformin at Discharge, pmd to continue monitor blood sugar control and diet modification 2. F/u with cardiology in three weeks, s/p DES stent, started on asa/plaivx/pradaxa, lopressor d/ced due to bradycardia.  Procedures    Coronary Balloon Angioplasty   Coronary Stent Intervention   Left Heart Cath and Coronary Angiography    Conclusion    1. Double vessel CAD 2. Patent stent left sided PDA with minimal restenosis.  3. Severe stenosis mid LAD involving the ostium of the moderate caliber Diagonal branch.  4. Successful PTCA/DES x 1 mid LAD 5. Successful cutting balloon angioplasty ostium Diagonal branch   Discharge Diagnoses:  Active Hospital Problems   Diagnosis Date Noted  . NSTEMI (non-ST elevated myocardial infarction) ([place])   . Diabetes mellitus type 2, noninsulin dependent ([place])   . Chest pain 06/24/2015  . Hyperglycemia 06/24/2015  . Atrial fibrillation ([place]) 08/23/2009  . TOBACCO ABUSE 04/10/2008  . CAD (coronary artery disease) 06/22/2006  . BPH (benign prostatic hyperplasia) 06/22/2006  . Depression 06/22/2006  . Hypertension 06/22/2006     Medication List    STOP taking these medications       metoprolol tartrate 25 MG tablet  Commonly known as: LOPRESSOR      TAKE these medications       amLODipine 10 MG tablet  Commonly known as: NORVASC  TAKE 1 BY MOUTH DAILY     aspirin 81 MG tablet  Take 81 mg by mouth daily.     clopidogrel 75 MG tablet  Commonly known as: PLAVIX    Take 1 tablet (75 mg total) by mouth daily.  Start taking on: 06/28/2015     finasteride 5 MG tablet  Commonly known as: PROSCAR  TAKE 1 BY MOUTH DAILY     metFORMIN 500 MG tablet  Commonly known as: GLUCOPHAGE  Take 1 tablet (500 mg total) by mouth 2 (two) times daily with a meal.     PARoxetine 20 MG tablet  Commonly known as: PAXIL  Take 1 tablet (20 mg total) by mouth daily.     PRADAXA 150 MG Caps capsule  Generic drug: dabigatran  TAKE 1 BY MOUTH TWICE DAILY     rosuvastatin 20 MG tablet  Commonly known as: CRESTOR  TAKE 1 BY MOUTH DAILY NO FURTHER REFILLS WITHOUT FOLLOW UP LAB WORK             he is here today for follow-up. He denies any bleeding or bruising on his dual antiplatelet therapy. He is also taking pradaxa  Her stroke prevntion secondary to atrial fibrillation. Since discontinuing metoprolol, he does report occasional fluttering in his chest but he denies any syncope or presyncope. Today he is bradycardic at 52 bpm. He denies any chest pain shortness of breath or dyspnea on exertion. He has not yet started metformin. He denies any polyuria, polydipsia, or blurred vision. He does admit to eating more sugar and candy and carbohydrates recently. I reviewed his hospital records. His hemoglobin A1c was mildly elevated at 7.3. LDL cholesterol was well below his goal of 70. Unfortunately the patient continues  to smoke and has no desire to quit smoking even after his recent heart attack. Past Medical History  Diagnosis Date  . Hyperlipidemia   . Hypertension   . Coronary artery disease   . Anxiety   . Depressed   . BPH (benign prostatic hypertrophy)   . MI (myocardial infarction) ([place])   . OSA (obstructive sleep apnea)   . Atrial fibrillation ([place])   . Cataracts, both eyes    Past Surgical History  Procedure Laterality Date  . Coronary stent placement  10/2004  . Cardiac catheterization N/A 06/26/2015    Procedure: Left Heart  Cath and Coronary Angiography;  Surgeon: Burnell Blanks, MD;  Location: [place] [facility];  Service: Cardiovascular;  Laterality: N/A;  . Cardiac catheterization  06/26/2015    Procedure: Coronary Stent Intervention;  Surgeon: Burnell Blanks, MD;  Location: [place] [facility];  Service: Cardiovascular;;  . Cardiac catheterization  06/26/2015    Procedure: Coronary Balloon Angioplasty;  Surgeon: Burnell Blanks, MD;  Location: [place] [facility];  Service: Cardiovascular;;   Current Outpatient Prescriptions on File Prior to Visit  Medication Sig Dispense Refill  . amLODipine (NORVASC) 10 MG tablet TAKE 1 BY MOUTH DAILY 90 tablet 3  . aspirin 81 MG tablet Take 81 mg by mouth daily.     . clopidogrel (PLAVIX) 75 MG tablet Take 1 tablet (75 mg total) by mouth daily. 30 tablet 0  . finasteride (PROSCAR) 5 MG tablet TAKE 1 BY MOUTH DAILY 90 tablet 3  . PARoxetine (PAXIL) 20 MG tablet Take 1 tablet (20 mg total) by mouth daily. 90 tablet 3  . PRADAXA 150 MG CAPS capsule TAKE 1 BY MOUTH TWICE DAILY 180 capsule 0  . rosuvastatin (CRESTOR) 20 MG tablet TAKE 1 BY MOUTH DAILY NO FURTHER REFILLS WITHOUT FOLLOW UP LAB WORK 90 tablet 0  . metFORMIN (GLUCOPHAGE) 500 MG tablet Take 1 tablet (500 mg total) by mouth 2 (two) times daily with a meal. (Patient not taking: Reported on 07/04/2015) 60 tablet 0   No current facility-administered medications on file prior to visit.   No Known Allergies Social History   Social History  . Marital Status: Divorced    Spouse Name: N/A  . Number of Children: N/A  . Years of Education: N/A   Occupational History  . Retired Civil engineer, contracting for [employer] History Main Topics  . Smoking status: Current Every Day Smoker -- 1.00 packs/day for 50 years  . Smokeless tobacco: Not on file  . Alcohol Use: No  . Drug Use: No  . Sexual Activity: Not on file   Other Topics Concern  . Not on file   Social History Narrative     Review of  Systems  All other systems reviewed and are negative.      Objective:   Physical Exam  Constitutional: He appears well-developed and well-nourished.  Neck: Neck supple.  Cardiovascular: Normal rate and normal heart sounds.   No murmur heard. Pulmonary/Chest: Effort normal and breath sounds normal. No respiratory distress. He has no wheezes. He has no rales.  Abdominal: Soft. Bowel sounds are normal. He exhibits no distension. There is no tenderness. There is no rebound and no guarding.  Musculoskeletal: He exhibits no edema.  Lymphadenopathy:    He has no cervical adenopathy.  Vitals reviewed.         Assessment & Plan:  Hospital discharge follow-up - Plan: CBC with Differential/Platelet, COMPLETE METABOLIC PANEL WITH  GFR  ASCVD (arteriosclerotic cardiovascular disease)  Chronic atrial fibrillation (HCC)  Benign essential HTN  Uncontrolled type 2 diabetes mellitus with hyperosmolarity without coma, without long-term current use of insulin (HCC)   Blood pressures well controlled. Cholesterol is well controlled. I recommended the patient begin metformin and recheck a hemoglobin A1c in 3 months. We did discuss Jardiance  In the 38% reduction and cardiac mortality however the patient states he has no interest in this medication. He also has no interest in smoking cessation even after I discussed at length how this complicates his multiple medical problems. He will recheck in 3 months. On the tip of his nose there is a cancerous appearing lesion. He sees dermatology given his history of skin cancer. 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

## 2015-07-05 ENCOUNTER — Other Ambulatory Visit: Payer: Self-pay | Admitting: Family Medicine

## 2015-07-05 ENCOUNTER — Encounter: Payer: Self-pay | Admitting: Family Medicine

## 2015-07-05 NOTE — Telephone Encounter (Signed)
Refill appropriate and filled per protocol. 

## 2015-07-15 ENCOUNTER — Telehealth: Payer: Self-pay | Admitting: Family Medicine

## 2015-07-15 NOTE — Telephone Encounter (Signed)
Ebony Hail a nurse from [employer] calling to speak to you regarding this patient  [phone]

## 2015-07-16 NOTE — Telephone Encounter (Signed)
LMTRC

## 2015-07-22 ENCOUNTER — Other Ambulatory Visit: Payer: Self-pay | Admitting: Family Medicine

## 2015-07-22 NOTE — Telephone Encounter (Signed)
Refill appropriate and filled per protocol. 

## 2015-07-29 NOTE — Telephone Encounter (Signed)
No return call - losing encounter

## 2015-07-30 ENCOUNTER — Other Ambulatory Visit: Payer: Self-pay | Admitting: *Deleted

## 2015-07-30 MED ORDER — ROSUVASTATIN CALCIUM 20 MG PO TABS: ORAL_TABLET | ORAL | Status: DC

## 2015-07-30 NOTE — Telephone Encounter (Signed)
Received fax requesting refill on Crestor.   Refill appropriate and filled per protocol.  

## 2015-10-01 ENCOUNTER — Encounter: Payer: Self-pay | Admitting: Family Medicine

## 2015-10-01 ENCOUNTER — Ambulatory Visit (INDEPENDENT_AMBULATORY_CARE_PROVIDER_SITE_OTHER): Payer: Medicare Other | Admitting: Family Medicine

## 2015-10-01 VITALS — BP 134/74 | HR 80 | Temp 98.2°F | Resp 20 | Wt 190.0 lb

## 2015-10-01 DIAGNOSIS — I482 Chronic atrial fibrillation, unspecified: Secondary | ICD-10-CM

## 2015-10-01 DIAGNOSIS — D485 Neoplasm of uncertain behavior of skin: Secondary | ICD-10-CM

## 2015-10-01 DIAGNOSIS — I2510 Atherosclerotic heart disease of native coronary artery without angina pectoris: Secondary | ICD-10-CM | POA: Diagnosis not present

## 2015-10-01 DIAGNOSIS — E119 Type 2 diabetes mellitus without complications: Secondary | ICD-10-CM

## 2015-10-01 DIAGNOSIS — I10 Essential (primary) hypertension: Secondary | ICD-10-CM

## 2015-10-01 MED ORDER — METOPROLOL TARTRATE 25 MG PO TABS: 12.5000 mg | ORAL_TABLET | Freq: Two times a day (BID) | ORAL | Status: DC

## 2015-10-01 MED ORDER — AMLODIPINE BESYLATE 10 MG PO TABS: ORAL_TABLET | ORAL | Status: DC

## 2015-10-01 NOTE — Progress Notes (Signed)
Subjective:    Patient ID: Eugene [name], male    DOB: 05/31/1940, 75 y.o.   MRN: [id number]  HPI  Recently admitted to hospital.  I have copied relevant portions of the discharge summary and included them below: Admit date: 06/24/2015 Discharge date: 06/27/2015  Time spent: <5mins  Recommendations for Outpatient Follow-up:  1. F/u with PMD within two weeks for hospital discharge follow up, started metformin at Discharge, pmd to continue monitor blood sugar control and diet modification 2. F/u with cardiology in three weeks, s/p DES stent, started on asa/plaivx/pradaxa, lopressor d/ced due to bradycardia.  Procedures    Coronary Balloon Angioplasty   Coronary Stent Intervention   Left Heart Cath and Coronary Angiography    Conclusion    1. Double vessel CAD 2. Patent stent left sided PDA with minimal restenosis.  3. Severe stenosis mid LAD involving the ostium of the moderate caliber Diagonal branch.  4. Successful PTCA/DES x 1 mid LAD 5. Successful cutting balloon angioplasty ostium Diagonal branch   Discharge Diagnoses:  Active Hospital Problems   Diagnosis Date Noted  . NSTEMI (non-ST elevated myocardial infarction) ([place])   . Diabetes mellitus type 2, noninsulin dependent ([place])   . Chest pain 06/24/2015  . Hyperglycemia 06/24/2015  . Atrial fibrillation ([place]) 08/23/2009  . TOBACCO ABUSE 04/10/2008  . CAD (coronary artery disease) 06/22/2006  . BPH (benign prostatic hyperplasia) 06/22/2006  . Depression 06/22/2006  . Hypertension 06/22/2006     Medication List    STOP taking these medications       metoprolol tartrate 25 MG tablet  Commonly known as: LOPRESSOR      TAKE these medications       amLODipine 10 MG tablet  Commonly known as: NORVASC  TAKE 1 BY MOUTH DAILY     aspirin 81 MG tablet  Take 81 mg by mouth daily.     clopidogrel 75 MG tablet  Commonly known as: PLAVIX    Take 1 tablet (75 mg total) by mouth daily.  Start taking on: 06/28/2015     finasteride 5 MG tablet  Commonly known as: PROSCAR  TAKE 1 BY MOUTH DAILY     metFORMIN 500 MG tablet  Commonly known as: GLUCOPHAGE  Take 1 tablet (500 mg total) by mouth 2 (two) times daily with a meal.     PARoxetine 20 MG tablet  Commonly known as: PAXIL  Take 1 tablet (20 mg total) by mouth daily.     PRADAXA 150 MG Caps capsule  Generic drug: dabigatran  TAKE 1 BY MOUTH TWICE DAILY     rosuvastatin 20 MG tablet  Commonly known as: CRESTOR  TAKE 1 BY MOUTH DAILY NO FURTHER REFILLS WITHOUT FOLLOW UP LAB WORK           07/05/15 He is here today for follow-up. He denies any bleeding or bruising on his dual antiplatelet therapy. He is also taking pradaxa  Her stroke prevntion secondary to atrial fibrillation. Since discontinuing metoprolol, he does report occasional fluttering in his chest but he denies any syncope or presyncope. Today he is bradycardic at 52 bpm. He denies any chest pain shortness of breath or dyspnea on exertion. He has not yet started metformin. He denies any polyuria, polydipsia, or blurred vision. He does admit to eating more sugar and candy and carbohydrates recently. I reviewed his hospital records. His hemoglobin A1c was mildly elevated at 7.3. LDL cholesterol was well below his goal of 70. Unfortunately the patient continues  to smoke and has no desire to quit smoking even after his recent heart attack.  Att that time, my plan was:  Blood pressures well controlled. Cholesterol is well controlled. I recommended the patient begin metformin and recheck a hemoglobin A1c in 3 months. We did discuss Jardiance  In the 38% reduction and cardiac mortality however the patient states he has no interest in this medication. He also has no interest in smoking cessation even after I discussed at length how this complicates his multiple medical problems. He will  recheck in 3 months. On the tip of his nose there is a cancerous appearing lesion. He sees dermatology given his history of skin cancer. 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  10/01/15 Since discontinuing metoprolol, the patient has noticed increased frequency of palpitations in his chest as well as tachycardia. He denies any chest pain shortness of breath or dyspnea on exertion. He is still smoking and he is audibly wheezing today on exam. He is tolerating the metformin without complication. He denies any diarrhea. He denies any abdominal pain or nausea or vomiting. He is compliant with his dual antiplatelet therapy as well as his pradaxa he denies any bleeding. He continues to have the suspicious lesion on the tip of his nose which appears to be a skin cancer. He never scheduled appointment with the dermatologist as recommended. Past Medical History  Diagnosis Date  . Hyperlipidemia   . Hypertension   . Coronary artery disease   . Anxiety   . Depressed   . BPH (benign prostatic hypertrophy)   . MI (myocardial infarction) ([place])   . OSA (obstructive sleep apnea)   . Atrial fibrillation ([place])   . Cataracts, both eyes    Past Surgical History  Procedure Laterality Date  . Coronary stent placement  10/2004  . Cardiac catheterization N/A 06/26/2015    Procedure: Left Heart Cath and Coronary Angiography;  Surgeon: Burnell Blanks, MD;  Location: [place] [facility];  Service: Cardiovascular;  Laterality: N/A;  . Cardiac catheterization  06/26/2015    Procedure: Coronary Stent Intervention;  Surgeon: Burnell Blanks, MD;  Location: [place] [facility];  Service: Cardiovascular;;  . Cardiac catheterization  06/26/2015    Procedure: Coronary Balloon Angioplasty;  Surgeon: Burnell Blanks, MD;  Location: [place] [facility];  Service: Cardiovascular;;    Current Outpatient Prescriptions on File Prior to Visit  Medication Sig Dispense Refill  . amLODipine (NORVASC) 10 MG tablet TAKE 1 BY MOUTH DAILY 90 tablet 3  . aspirin 81 MG tablet Take 81 mg by mouth daily.     . clopidogrel (PLAVIX) 75 MG tablet Take 1 tablet (75 mg total) by mouth daily. 30 tablet 0  . clopidogrel (PLAVIX) 75 MG tablet TAKE 1 TABLET(75 MG) BY MOUTH DAILY 30 tablet 3  . finasteride (PROSCAR) 5 MG tablet TAKE 1 BY MOUTH DAILY 90 tablet 3  . metFORMIN (GLUCOPHAGE) 500 MG tablet Take 1 tablet (500 mg total) by mouth 2 (two) times daily with a meal. 60 tablet 3  . metoprolol tartrate (LOPRESSOR) 25 MG tablet Take 25 mg by mouth 2 (two) times daily.   0  . nitroGLYCERIN (NITROSTAT) 0.4 MG SL tablet ONE TABLET UNDER TONGUE AS NEEDED FOR CHEST PAIN EVERY 5 MINUTES- TAKE A MAX OF 3 TABLETS BEFORE ER VISIT 25 tablet  0  . PARoxetine (PAXIL) 20 MG tablet Take 1 tablet (20 mg total) by mouth daily. 90 tablet 3  . PRADAXA 150 MG CAPS capsule TAKE 1 BY MOUTH TWICE DAILY 180 capsule 0  . rosuvastatin (CRESTOR) 20 MG tablet TAKE 1 BY MOUTH DAILY NO FURTHER REFILLS WITHOUT FOLLOW UP LAB WORK 90 tablet 0   No current facility-administered medications on file prior to visit.   No Known Allergies Social History   Social History  . Marital Status: Divorced    Spouse Name: N/A  . Number of Children: N/A  . Years of Education: N/A   Occupational History  . Retired Civil engineer, contracting for [employer] History Main Topics  . Smoking status: Current Every Day Smoker -- 1.00 packs/day for 50 years  . Smokeless tobacco: Not on file  . Alcohol Use: No  . Drug Use: No  . Sexual Activity: Not on file   Other Topics Concern  . Not on file   Social History Narrative     Review of Systems  All other systems reviewed and are negative.      Objective:   Physical Exam  Constitutional: He appears well-developed and well-nourished.  Neck: Neck supple.  Cardiovascular: Normal rate  and normal heart sounds.   No murmur heard. Pulmonary/Chest: Effort normal and breath sounds normal. No respiratory distress. He has no wheezes. He has no rales.  Abdominal: Soft. Bowel sounds are normal. He exhibits no distension. There is no tenderness. There is no rebound and no guarding.  Musculoskeletal: He exhibits no edema.  Lymphadenopathy:    He has no cervical adenopathy.  Vitals reviewed.         Assessment & Plan:  Neoplasm of uncertain behavior of skin - Plan: Ambulatory referral to Dermatology  Controlled type 2 diabetes mellitus without complication, without long-term current use of insulin ([place]) - Plan: COMPLETE METABOLIC PANEL WITH GFR, Hemoglobin A1c  ASCVD (arteriosclerotic cardiovascular disease)  Chronic atrial fibrillation (HCC)  Benign essential HTN  I am going to schedule the appointment with the dermatologist myself. This is clearly some type of malignancy and needs excision. Given the location I would recommend a dermatologist. I will recheck his hemoglobin A1c. Goal hemoglobin A1c is less than 6.5. I continue to encourage the patient quit smoking but he has no desire to do so at the present time. Given his increased frequency of tachycardia palpitations, I have recommended that he resume metoprolol at a reduced dose 12.5 mg by mouth twice a day

## 2015-10-02 LAB — COMPLETE METABOLIC PANEL WITH GFR
ALT: 11 U/L (ref 9–46)
AST: 16 U/L (ref 10–35)
Albumin: 3.7 g/dL (ref 3.6–5.1)
Alkaline Phosphatase: 55 U/L (ref 40–115)
BUN: 21 mg/dL (ref 7–25)
CHLORIDE: 104 mmol/L (ref 98–110)
CO2: 25 mmol/L (ref 20–31)
CREATININE: 1.24 mg/dL — AB (ref 0.70–1.18)
Calcium: 8.4 mg/dL — ABNORMAL LOW (ref 8.6–10.3)
GFR, Est African American: 65 mL/min (ref >=60)
GFR, Est Non African American: 56 mL/min — ABNORMAL LOW (ref >=60)
GLUCOSE: 143 mg/dL — AB (ref 70–99)
Potassium: 4.5 mmol/L (ref 3.5–5.3)
SODIUM: 140 mmol/L (ref 135–146)
Total Bilirubin: 0.5 mg/dL (ref 0.2–1.2)
Total Protein: 6 g/dL — ABNORMAL LOW (ref 6.1–8.1)

## 2015-10-02 LAB — HEMOGLOBIN A1C
Hgb A1c MFr Bld: 6.4 % — ABNORMAL HIGH (ref <5.7)
Mean Plasma Glucose: 137 mg/dL

## 2015-10-03 ENCOUNTER — Encounter: Payer: Self-pay | Admitting: *Deleted

## 2015-10-23 ENCOUNTER — Telehealth: Payer: Self-pay | Admitting: *Deleted

## 2015-10-23 NOTE — Telephone Encounter (Signed)
Received call from [place] with [facility].   Reports that Pradaxa requires PA.   Clinical questions submitted.   Patient has been on med since 11/2013 for AFib and ASCVD.

## 2015-10-23 NOTE — Telephone Encounter (Signed)
Received determination on Pradaxa.   Tier exception PA denied.   Patient must try and fail Xarelto.   MD please advise.

## 2015-10-23 NOTE — Telephone Encounter (Signed)
Will defer to PCP why pt on 3 anticoagulation agents in general, no recent cardiology note either

## 2015-10-24 MED ORDER — RIVAROXABAN 20 MG PO TABS: 20.0000 mg | ORAL_TABLET | Freq: Every day | ORAL | Status: DC

## 2015-10-24 NOTE — Telephone Encounter (Signed)
Switch to xarelto 20 mg poqday for a fib.  Has recently been placed on asa and plavix due to Delta and DES for CAD.

## 2015-10-24 NOTE — Telephone Encounter (Signed)
Call placed to patient and patient made aware.   Prescription sent to pharmacy.  

## 2015-10-25 ENCOUNTER — Telehealth: Payer: Self-pay

## 2015-10-25 NOTE — Telephone Encounter (Signed)
Fax from [facility] regarding Xarelto 20mg - use of Xarelto with Pradaxa and Plavix may increase the risk of bleeding is MD discontinuing any meds? Please Advise.

## 2015-10-25 NOTE — Telephone Encounter (Signed)
Called [facility] mail service and advised them to stop pradaxa, medicaiton taken off list.

## 2015-10-25 NOTE — Telephone Encounter (Signed)
Should only be on aspirin plavix and xarelto.  I am aware he is on three "blood thinners" but this was recommended by cardiology after he had a stent.  He should NOT be on pradaxa.

## 2015-11-05 ENCOUNTER — Other Ambulatory Visit: Payer: Self-pay | Admitting: Family Medicine

## 2015-11-05 NOTE — Telephone Encounter (Signed)
Refill appropriate and filled per protocol. 

## 2015-11-13 DIAGNOSIS — L570 Actinic keratosis: Secondary | ICD-10-CM | POA: Diagnosis not present

## 2015-11-13 DIAGNOSIS — C44311 Basal cell carcinoma of skin of nose: Secondary | ICD-10-CM | POA: Diagnosis not present

## 2015-11-13 DIAGNOSIS — L814 Other melanin hyperpigmentation: Secondary | ICD-10-CM | POA: Diagnosis not present

## 2015-11-13 DIAGNOSIS — D485 Neoplasm of uncertain behavior of skin: Secondary | ICD-10-CM | POA: Diagnosis not present

## 2015-12-11 ENCOUNTER — Telehealth: Payer: Self-pay | Admitting: Family Medicine

## 2015-12-11 NOTE — Telephone Encounter (Signed)
Katharine Look calling to see if patient is supposed to be taking 1 or 2 metformin patient states he is only taking 1. She states she sent a fax either last Thursday or Friday to clarify the dosage.  CB# [phone]

## 2015-12-12 NOTE — Telephone Encounter (Signed)
Metformin 500 bid

## 2015-12-15 ENCOUNTER — Other Ambulatory Visit: Payer: Self-pay | Admitting: Family Medicine

## 2015-12-19 NOTE — Telephone Encounter (Signed)
Spoke to pt and he is only actually taking metformin once a day and I did tell him Dr wanted him to be taking it bid. He states that he is due for labs in October but will discuss with MD at that time. Will call and let ins know.

## 2015-12-19 NOTE — Telephone Encounter (Signed)
LMTRC

## 2016-02-21 ENCOUNTER — Other Ambulatory Visit: Payer: Self-pay | Admitting: Family Medicine

## 2016-03-19 DIAGNOSIS — C44311 Basal cell carcinoma of skin of nose: Secondary | ICD-10-CM | POA: Diagnosis not present

## 2016-04-08 ENCOUNTER — Ambulatory Visit (INDEPENDENT_AMBULATORY_CARE_PROVIDER_SITE_OTHER): Payer: Medicare Other | Admitting: Family Medicine

## 2016-04-08 ENCOUNTER — Encounter: Payer: Self-pay | Admitting: Family Medicine

## 2016-04-08 VITALS — BP 150/82 | HR 74 | Temp 98.8°F | Resp 16 | Ht 72.0 in | Wt 197.0 lb

## 2016-04-08 DIAGNOSIS — E119 Type 2 diabetes mellitus without complications: Secondary | ICD-10-CM | POA: Diagnosis not present

## 2016-04-08 DIAGNOSIS — Z23 Encounter for immunization: Secondary | ICD-10-CM | POA: Diagnosis not present

## 2016-04-08 LAB — COMPLETE METABOLIC PANEL WITH GFR
ALBUMIN: 3.8 g/dL (ref 3.6–5.1)
ALK PHOS: 69 U/L (ref 40–115)
ALT: 12 U/L (ref 9–46)
AST: 13 U/L (ref 10–35)
BUN: 20 mg/dL (ref 7–25)
CALCIUM: 8.7 mg/dL (ref 8.6–10.3)
CHLORIDE: 108 mmol/L (ref 98–110)
CO2: 24 mmol/L (ref 20–31)
Creat: 1.14 mg/dL (ref 0.70–1.18)
GFR, EST NON AFRICAN AMERICAN: 62 mL/min (ref >=60)
GFR, Est African American: 71 mL/min (ref >=60)
GLUCOSE: 94 mg/dL (ref 70–99)
POTASSIUM: 4.6 mmol/L (ref 3.5–5.3)
SODIUM: 140 mmol/L (ref 135–146)
Total Bilirubin: 0.6 mg/dL (ref 0.2–1.2)
Total Protein: 6.7 g/dL (ref 6.1–8.1)

## 2016-04-08 LAB — LIPID PANEL
CHOLESTEROL: 148 mg/dL (ref <200)
HDL: 36 mg/dL — AB (ref >40)
LDL Cholesterol: 91 mg/dL (ref <100)
TRIGLYCERIDES: 104 mg/dL (ref <150)
Total CHOL/HDL Ratio: 4.1 Ratio (ref <5.0)
VLDL: 21 mg/dL (ref <30)

## 2016-04-08 LAB — HEMOGLOBIN A1C
HEMOGLOBIN A1C: 6.1 % — AB (ref <5.7)
Mean Plasma Glucose: 128 mg/dL

## 2016-04-08 MED ORDER — LISINOPRIL 20 MG PO TABS: 20.0000 mg | ORAL_TABLET | Freq: Every day | ORAL | 3 refills | Status: DC

## 2016-04-08 MED ORDER — ALPRAZOLAM 0.5 MG PO TABS: 0.5000 mg | ORAL_TABLET | Freq: Three times a day (TID) | ORAL | 0 refills | Status: DC | PRN

## 2016-04-08 NOTE — Progress Notes (Signed)
Subjective:    Patient ID: Eugene [name], male    DOB: [birth year], 75 y.o.   MRN: [id number]  HPI Recently admitted to hospital.  I have copied relevant portions of the discharge summary and included them below: Admit date: 06/24/2015 Discharge date: 06/27/2015  Time spent: <36mins  Recommendations for Outpatient Follow-up:  1. F/u with PMD within two weeks for hospital discharge follow up, started metformin at Discharge, pmd to continue monitor blood sugar control and diet modification 2. F/u with cardiology in three weeks, s/p DES stent, started on asa/plaivx/pradaxa, lopressor d/ced due to bradycardia.  Procedures    Coronary Balloon Angioplasty   Coronary Stent Intervention   Left Heart Cath and Coronary Angiography    Conclusion    1. Double vessel CAD 2. Patent stent left sided PDA with minimal restenosis.  3. Severe stenosis mid LAD involving the ostium of the moderate caliber Diagonal branch.  4. Successful PTCA/DES x 1 mid LAD 5. Successful cutting balloon angioplasty ostium Diagonal branch   Discharge Diagnoses:  Active Hospital Problems   Diagnosis Date Noted  . NSTEMI (non-ST elevated myocardial infarction) ([place])   . Diabetes mellitus type 2, noninsulin dependent ([place])   . Chest pain 06/24/2015  . Hyperglycemia 06/24/2015  . Atrial fibrillation ([place]) 08/23/2009  . TOBACCO ABUSE 04/10/2008  . CAD (coronary artery disease) 06/22/2006  . BPH (benign prostatic hyperplasia) 06/22/2006  . Depression 06/22/2006  . Hypertension 06/22/2006     Medication List    STOP taking these medications       metoprolol tartrate 25 MG tablet  Commonly known as: LOPRESSOR      TAKE these medications       amLODipine 10 MG tablet  Commonly known as: NORVASC  TAKE 1 BY MOUTH DAILY     aspirin 81 MG tablet  Take 81 mg by mouth daily.     clopidogrel 75 MG tablet  Commonly known as: PLAVIX    Take 1 tablet (75 mg total) by mouth daily.  Start taking on: 06/28/2015     finasteride 5 MG tablet  Commonly known as: PROSCAR  TAKE 1 BY MOUTH DAILY     metFORMIN 500 MG tablet  Commonly known as: GLUCOPHAGE  Take 1 tablet (500 mg total) by mouth 2 (two) times daily with a meal.     PARoxetine 20 MG tablet  Commonly known as: PAXIL  Take 1 tablet (20 mg total) by mouth daily.     PRADAXA 150 MG Caps capsule  Generic drug: dabigatran  TAKE 1 BY MOUTH TWICE DAILY     rosuvastatin 20 MG tablet  Commonly known as: CRESTOR  TAKE 1 BY MOUTH DAILY NO FURTHER REFILLS WITHOUT FOLLOW UP LAB WORK           07/05/15 He is here today for follow-up. He denies any bleeding or bruising on his dual antiplatelet therapy. He is also taking pradaxa  Her stroke prevntion secondary to atrial fibrillation. Since discontinuing metoprolol, he does report occasional fluttering in his chest but he denies any syncope or presyncope. Today he is bradycardic at 52 bpm. He denies any chest pain shortness of breath or dyspnea on exertion. He has not yet started metformin. He denies any polyuria, polydipsia, or blurred vision. He does admit to eating more sugar and candy and carbohydrates recently. I reviewed his hospital records. His hemoglobin A1c was mildly elevated at 7.3. LDL cholesterol was well below his goal of 70. Unfortunately the patient continues to  smoke and has no desire to quit smoking even after his recent heart attack.  Att that time, my plan was:  Blood pressures well controlled. Cholesterol is well controlled. I recommended the patient begin metformin and recheck a hemoglobin A1c in 3 months. We did discuss Jardiance  In the 38% reduction and cardiac mortality however the patient states he has no interest in this medication. He also has no interest in smoking cessation even after I discussed at length how this complicates his multiple medical problems. He will  recheck in 3 months. On the tip of his nose there is a cancerous appearing lesion. He sees dermatology given his history of skin cancer. 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  10/01/15 Since discontinuing metoprolol, the patient has noticed increased frequency of palpitations in his chest as well as tachycardia. He denies any chest pain shortness of breath or dyspnea on exertion. He is still smoking and he is audibly wheezing today on exam. He is tolerating the metformin without complication. He denies any diarrhea. He denies any abdominal pain or nausea or vomiting. He is compliant with his dual antiplatelet therapy as well as his pradaxa he denies any bleeding. He continues to have the suspicious lesion on the tip of his nose which appears to be a skin cancer. He never scheduled appointment with the dermatologist as recommended.  At that time, my plan was: I am going to schedule the appointment with the dermatologist myself. This is clearly some type of malignancy and needs excision. Given the location I would recommend a dermatologist. I will recheck his hemoglobin A1c. Goal hemoglobin A1c is less than 6.5. I continue to encourage the patient quit smoking but he has no desire to do so at the present time. Given his increased frequency of tachycardia palpitations, I have recommended that he resume metoprolol at a reduced dose 12.5 mg by mouth twice a day  04/08/16 At that visit, A1c was 6.4.  Patient recently saw a dermatologist who removed the skin cancer from his nose. Unfortunately this left a 3 cm long 1 cm wide opening on the bridge of his nose down to the tip of his nose that is left to close by secondary intention. There is no evidence of cellulitis or infection. He is not checking his sugars but he denies any polyuria, polydipsia, or blurred vision. He continues to  smoke and has no desire to quit smoking. His blood pressure today is elevated. The patient believes he is not taking metoprolol although he is not sure. It is on his medicine list here but it is not on his home medicine list. He denies any myalgias or right upper quadrant pain to his statin medication Past Medical History:  Diagnosis Date  . Anxiety   . Atrial fibrillation ([place])   . BPH (benign prostatic hypertrophy)   . Cataracts, both eyes   . Coronary artery disease   . Depressed   . Hyperlipidemia   . Hypertension   . MI (myocardial infarction)   . OSA (obstructive sleep apnea)    Past Surgical History:  Procedure Laterality Date  . CARDIAC CATHETERIZATION N/A 06/26/2015   Procedure: Left Heart Cath and Coronary Angiography;  Surgeon: Burnell Blanks, MD;  Location: [facility];  Service: Cardiovascular;  Laterality: N/A;  . CARDIAC CATHETERIZATION  06/26/2015  Procedure: Coronary Stent Intervention;  Surgeon: Burnell Blanks, MD;  Location: [facility];  Service: Cardiovascular;;  . CARDIAC CATHETERIZATION  06/26/2015   Procedure: Coronary Balloon Angioplasty;  Surgeon: Burnell Blanks, MD;  Location: [facility];  Service: Cardiovascular;;  . CORONARY STENT PLACEMENT  10/2004   Current Outpatient Prescriptions on File Prior to Visit  Medication Sig Dispense Refill  . amLODipine (NORVASC) 10 MG tablet TAKE 1 BY MOUTH DAILY 90 tablet 3  . aspirin 81 MG tablet Take 81 mg by mouth daily.     . clopidogrel (PLAVIX) 75 MG tablet TAKE 1 TABLET(75 MG) BY MOUTH DAILY 30 tablet 5  . finasteride (PROSCAR) 5 MG tablet TAKE 1 BY MOUTH DAILY 90 tablet 3  . metFORMIN (GLUCOPHAGE) 500 MG tablet Take 1 tablet (500 mg total) by mouth 2 (two) times daily with a meal. 60 tablet 3  . metoprolol tartrate (LOPRESSOR) 25 MG tablet Take 0.5 tablets (12.5 mg total) by mouth 2 (two) times daily. Reported on 10/01/2015 60 tablet 5  . nitroGLYCERIN (NITROSTAT) 0.4 MG SL  tablet ONE TABLET UNDER TONGUE AS NEEDED FOR CHEST PAIN EVERY 5 MINUTES- TAKE A MAX OF 3 TABLETS BEFORE ER VISIT 25 tablet 0  . PARoxetine (PAXIL) 20 MG tablet TAKE 1 TABLET BY MOUTH DAILY 90 tablet 0  . rivaroxaban (XARELTO) 20 MG TABS tablet Take 1 tablet (20 mg total) by mouth daily with supper. 90 tablet 1  . rosuvastatin (CRESTOR) 20 MG tablet TAKE 1 BY MOUTH DAILY NO FURTHER REFILLS WITHOUT FOLLOW UP LAB WORK 90 tablet 0   No current facility-administered medications on file prior to visit.    No Known Allergies Social History   Social History  . Marital status: Divorced    Spouse name: N/A  . Number of children: N/A  . Years of education: N/A   Occupational History  . Retired Civil engineer, contracting for [employer] History Main Topics  . Smoking status: Current Every Day Smoker    Packs/day: 1.00    Years: 50.00  . Smokeless tobacco: Not on file  . Alcohol use No  . Drug use: No  . Sexual activity: Not on file   Other Topics Concern  . Not on file   Social History Narrative  . No narrative on file     Review of Systems  All other systems reviewed and are negative.      Objective:   Physical Exam  Constitutional: He appears well-developed and well-nourished.  Neck: Neck supple.  Cardiovascular: Normal rate and normal heart sounds.   No murmur heard. Pulmonary/Chest: Effort normal and breath sounds normal. No respiratory distress. He has no wheezes. He has no rales.  Abdominal: Soft. Bowel sounds are normal. He exhibits no distension. There is no tenderness. There is no rebound and no guarding.  Musculoskeletal: He exhibits no edema.  Lymphadenopathy:    He has no cervical adenopathy.  Vitals reviewed.         Assessment & Plan:  Diabetes mellitus type II, non insulin dependent (HCC) - Plan: COMPLETE METABOLIC PANEL WITH GFR, Hemoglobin A1c, Lipid panel, Microalbumin, urine  Needs flu shot - Plan: Flu Vaccine QUAD 36+ mos IM  Patient's blood pressure  is not adequately controlled. I will add lisinopril 20 mg by mouth daily to his other medication and recheck blood pressure in 2 weeks. I will check a fasting lipid panel. Given his history of coronary artery disease, his goal  LDL cholesterol is less than 70 and based on the new ACE guidelines it would be less than 55. I will check a hemoglobin A1c. His goal hemoglobin A1c is less than 6.5. I encouraged the patient to continue to quit smoking however he has no desire to quit. Patient mentions that he starting to have occasional panic attacks. He brings in a bottle of Xanax that he was prescribed 11 years ago. He is used only a handful the tablets. He states the panic attacks occur infrequently but he would like to have the Xanax in case they do happen. He plans to use less than one or 2 tablets every few months. Therefore I gave the patient prescription for Xanax 0.5 mg 1 every 8 hours as needed for anxiety gave him 30 tablets. This should be more than enough to last for a year patient will recheck the wound on the tip of his nose. If no significant healing is seen in the next 3-4 weeks, consider a skin graft. Meanwhile he will keep the wound clean and dry. He will apply Polysporin to the base of the wound on a daily basis

## 2016-05-20 ENCOUNTER — Other Ambulatory Visit: Payer: Self-pay | Admitting: Family Medicine

## 2016-05-31 ENCOUNTER — Other Ambulatory Visit: Payer: Self-pay | Admitting: Family Medicine

## 2016-07-04 ENCOUNTER — Other Ambulatory Visit: Payer: Self-pay | Admitting: Family Medicine

## 2016-07-28 ENCOUNTER — Other Ambulatory Visit: Payer: Self-pay | Admitting: Family Medicine

## 2016-09-20 ENCOUNTER — Other Ambulatory Visit: Payer: Self-pay | Admitting: Family Medicine

## 2016-11-01 ENCOUNTER — Other Ambulatory Visit: Payer: Self-pay | Admitting: Family Medicine

## 2017-03-09 ENCOUNTER — Ambulatory Visit (INDEPENDENT_AMBULATORY_CARE_PROVIDER_SITE_OTHER): Payer: Medicare Other

## 2017-03-09 DIAGNOSIS — Z23 Encounter for immunization: Secondary | ICD-10-CM | POA: Diagnosis not present

## 2017-03-09 NOTE — Progress Notes (Signed)
Patient was seen in office for flu vaccine . patient received vaccine in left deltoid. patient tolerate well

## 2017-05-20 ENCOUNTER — Other Ambulatory Visit: Payer: Self-pay | Admitting: Family Medicine

## 2017-05-24 ENCOUNTER — Other Ambulatory Visit: Payer: Self-pay | Admitting: Family Medicine

## 2017-05-25 ENCOUNTER — Other Ambulatory Visit: Payer: Self-pay | Admitting: Family Medicine

## 2017-07-06 ENCOUNTER — Other Ambulatory Visit: Payer: Self-pay | Admitting: Family Medicine

## 2017-08-16 DIAGNOSIS — I619 Nontraumatic intracerebral hemorrhage, unspecified: Secondary | ICD-10-CM

## 2017-08-16 HISTORY — DX: Nontraumatic intracerebral hemorrhage, unspecified: I61.9

## 2017-08-25 ENCOUNTER — Other Ambulatory Visit: Payer: Self-pay

## 2017-08-25 ENCOUNTER — Emergency Department (HOSPITAL_COMMUNITY): Payer: Medicare Other

## 2017-08-25 ENCOUNTER — Inpatient Hospital Stay (HOSPITAL_COMMUNITY): Payer: Medicare Other

## 2017-08-25 ENCOUNTER — Inpatient Hospital Stay (HOSPITAL_COMMUNITY)
Admission: EM | Admit: 2017-08-25 | Discharge: 2017-08-31 | DRG: 065 | Disposition: A | Discharge status: Rehabilitation Facility | Payer: Medicare Other | Attending: Neurology | Admitting: Neurology

## 2017-08-25 ENCOUNTER — Encounter (HOSPITAL_COMMUNITY): Payer: Self-pay

## 2017-08-25 DIAGNOSIS — G8191 Hemiplegia, unspecified affecting right dominant side: Secondary | ICD-10-CM | POA: Diagnosis not present

## 2017-08-25 DIAGNOSIS — Z7902 Long term (current) use of antithrombotics/antiplatelets: Secondary | ICD-10-CM

## 2017-08-25 DIAGNOSIS — I69120 Aphasia following nontraumatic intracerebral hemorrhage: Secondary | ICD-10-CM | POA: Diagnosis not present

## 2017-08-25 DIAGNOSIS — R4781 Slurred speech: Secondary | ICD-10-CM | POA: Diagnosis not present

## 2017-08-25 DIAGNOSIS — I481 Persistent atrial fibrillation: Secondary | ICD-10-CM | POA: Diagnosis not present

## 2017-08-25 DIAGNOSIS — R451 Restlessness and agitation: Secondary | ICD-10-CM | POA: Diagnosis present

## 2017-08-25 DIAGNOSIS — R29710 NIHSS score 10: Secondary | ICD-10-CM | POA: Diagnosis not present

## 2017-08-25 DIAGNOSIS — R270 Ataxia, unspecified: Secondary | ICD-10-CM | POA: Diagnosis not present

## 2017-08-25 DIAGNOSIS — E785 Hyperlipidemia, unspecified: Secondary | ICD-10-CM | POA: Diagnosis not present

## 2017-08-25 DIAGNOSIS — I619 Nontraumatic intracerebral hemorrhage, unspecified: Secondary | ICD-10-CM | POA: Diagnosis not present

## 2017-08-25 DIAGNOSIS — R471 Dysarthria and anarthria: Secondary | ICD-10-CM | POA: Diagnosis present

## 2017-08-25 DIAGNOSIS — F17210 Nicotine dependence, cigarettes, uncomplicated: Secondary | ICD-10-CM | POA: Diagnosis not present

## 2017-08-25 DIAGNOSIS — I4891 Unspecified atrial fibrillation: Secondary | ICD-10-CM | POA: Diagnosis present

## 2017-08-25 DIAGNOSIS — I6523 Occlusion and stenosis of bilateral carotid arteries: Secondary | ICD-10-CM | POA: Diagnosis not present

## 2017-08-25 DIAGNOSIS — F05 Delirium due to known physiological condition: Secondary | ICD-10-CM | POA: Diagnosis present

## 2017-08-25 DIAGNOSIS — I252 Old myocardial infarction: Secondary | ICD-10-CM | POA: Diagnosis not present

## 2017-08-25 DIAGNOSIS — F419 Anxiety disorder, unspecified: Secondary | ICD-10-CM | POA: Diagnosis not present

## 2017-08-25 DIAGNOSIS — G4733 Obstructive sleep apnea (adult) (pediatric): Secondary | ICD-10-CM

## 2017-08-25 DIAGNOSIS — Z7901 Long term (current) use of anticoagulants: Secondary | ICD-10-CM

## 2017-08-25 DIAGNOSIS — I6789 Other cerebrovascular disease: Secondary | ICD-10-CM | POA: Diagnosis not present

## 2017-08-25 DIAGNOSIS — R29810 Facial weakness: Secondary | ICD-10-CM | POA: Diagnosis not present

## 2017-08-25 DIAGNOSIS — N401 Enlarged prostate with lower urinary tract symptoms: Secondary | ICD-10-CM | POA: Diagnosis not present

## 2017-08-25 DIAGNOSIS — R58 Hemorrhage, not elsewhere classified: Secondary | ICD-10-CM | POA: Diagnosis not present

## 2017-08-25 DIAGNOSIS — R062 Wheezing: Secondary | ICD-10-CM | POA: Diagnosis present

## 2017-08-25 DIAGNOSIS — Z7984 Long term (current) use of oral hypoglycemic drugs: Secondary | ICD-10-CM | POA: Diagnosis not present

## 2017-08-25 DIAGNOSIS — Z79899 Other long term (current) drug therapy: Secondary | ICD-10-CM

## 2017-08-25 DIAGNOSIS — I214 Non-ST elevation (NSTEMI) myocardial infarction: Secondary | ICD-10-CM | POA: Diagnosis present

## 2017-08-25 DIAGNOSIS — I2510 Atherosclerotic heart disease of native coronary artery without angina pectoris: Secondary | ICD-10-CM | POA: Diagnosis present

## 2017-08-25 DIAGNOSIS — N179 Acute kidney failure, unspecified: Secondary | ICD-10-CM | POA: Diagnosis not present

## 2017-08-25 DIAGNOSIS — R4701 Aphasia: Secondary | ICD-10-CM | POA: Diagnosis not present

## 2017-08-25 DIAGNOSIS — N182 Chronic kidney disease, stage 2 (mild): Secondary | ICD-10-CM | POA: Diagnosis not present

## 2017-08-25 DIAGNOSIS — N319 Neuromuscular dysfunction of bladder, unspecified: Secondary | ICD-10-CM | POA: Diagnosis not present

## 2017-08-25 DIAGNOSIS — I651 Occlusion and stenosis of basilar artery: Secondary | ICD-10-CM | POA: Diagnosis present

## 2017-08-25 DIAGNOSIS — I69151 Hemiplegia and hemiparesis following nontraumatic intracerebral hemorrhage affecting right dominant side: Secondary | ICD-10-CM | POA: Diagnosis not present

## 2017-08-25 DIAGNOSIS — R482 Apraxia: Secondary | ICD-10-CM | POA: Diagnosis not present

## 2017-08-25 DIAGNOSIS — R29818 Other symptoms and signs involving the nervous system: Secondary | ICD-10-CM | POA: Diagnosis not present

## 2017-08-25 DIAGNOSIS — E1151 Type 2 diabetes mellitus with diabetic peripheral angiopathy without gangrene: Secondary | ICD-10-CM | POA: Diagnosis present

## 2017-08-25 DIAGNOSIS — R404 Transient alteration of awareness: Secondary | ICD-10-CM | POA: Diagnosis not present

## 2017-08-25 DIAGNOSIS — I69320 Aphasia following cerebral infarction: Secondary | ICD-10-CM | POA: Diagnosis not present

## 2017-08-25 DIAGNOSIS — N400 Enlarged prostate without lower urinary tract symptoms: Secondary | ICD-10-CM | POA: Diagnosis present

## 2017-08-25 DIAGNOSIS — R531 Weakness: Secondary | ICD-10-CM | POA: Diagnosis not present

## 2017-08-25 DIAGNOSIS — I161 Hypertensive emergency: Secondary | ICD-10-CM | POA: Diagnosis not present

## 2017-08-25 DIAGNOSIS — I618 Other nontraumatic intracerebral hemorrhage: Secondary | ICD-10-CM | POA: Diagnosis not present

## 2017-08-25 DIAGNOSIS — I639 Cerebral infarction, unspecified: Secondary | ICD-10-CM | POA: Diagnosis not present

## 2017-08-25 DIAGNOSIS — R0602 Shortness of breath: Secondary | ICD-10-CM

## 2017-08-25 DIAGNOSIS — Z955 Presence of coronary angioplasty implant and graft: Secondary | ICD-10-CM | POA: Diagnosis not present

## 2017-08-25 DIAGNOSIS — I10 Essential (primary) hypertension: Secondary | ICD-10-CM | POA: Diagnosis not present

## 2017-08-25 DIAGNOSIS — Z7982 Long term (current) use of aspirin: Secondary | ICD-10-CM

## 2017-08-25 DIAGNOSIS — E119 Type 2 diabetes mellitus without complications: Secondary | ICD-10-CM

## 2017-08-25 DIAGNOSIS — I129 Hypertensive chronic kidney disease with stage 1 through stage 4 chronic kidney disease, or unspecified chronic kidney disease: Secondary | ICD-10-CM | POA: Diagnosis not present

## 2017-08-25 DIAGNOSIS — R0600 Dyspnea, unspecified: Secondary | ICD-10-CM

## 2017-08-25 DIAGNOSIS — Z8673 Personal history of transient ischemic attack (TIA), and cerebral infarction without residual deficits: Secondary | ICD-10-CM

## 2017-08-25 DIAGNOSIS — I610 Nontraumatic intracerebral hemorrhage in hemisphere, subcortical: Secondary | ICD-10-CM | POA: Diagnosis not present

## 2017-08-25 DIAGNOSIS — R278 Other lack of coordination: Secondary | ICD-10-CM

## 2017-08-25 DIAGNOSIS — R350 Frequency of micturition: Secondary | ICD-10-CM | POA: Diagnosis not present

## 2017-08-25 DIAGNOSIS — I69359 Hemiplegia and hemiparesis following cerebral infarction affecting unspecified side: Secondary | ICD-10-CM | POA: Diagnosis not present

## 2017-08-25 DIAGNOSIS — E1122 Type 2 diabetes mellitus with diabetic chronic kidney disease: Secondary | ICD-10-CM | POA: Diagnosis not present

## 2017-08-25 DIAGNOSIS — R7303 Prediabetes: Secondary | ICD-10-CM | POA: Diagnosis not present

## 2017-08-25 DIAGNOSIS — I6389 Other cerebral infarction: Secondary | ICD-10-CM | POA: Diagnosis not present

## 2017-08-25 HISTORY — DX: Prediabetes: R73.03

## 2017-08-25 HISTORY — DX: Nontraumatic intracerebral hemorrhage, unspecified: I61.9

## 2017-08-25 LAB — CBC
HEMATOCRIT: 48 % (ref 39.0–52.0)
Hemoglobin: 15.1 g/dL (ref 13.0–17.0)
MCH: 28.9 pg (ref 26.0–34.0)
MCHC: 31.5 g/dL (ref 30.0–36.0)
MCV: 91.8 fL (ref 78.0–100.0)
Platelets: 183 10*3/uL (ref 150–400)
RBC: 5.23 MIL/uL (ref 4.22–5.81)
RDW: 15 % (ref 11.5–15.5)
WBC: 7.4 10*3/uL (ref 4.0–10.5)

## 2017-08-25 LAB — COMPREHENSIVE METABOLIC PANEL
ALK PHOS: 72 U/L (ref 38–126)
ALT: 14 U/L — AB (ref 17–63)
AST: 15 U/L (ref 15–41)
Albumin: 3.9 g/dL (ref 3.5–5.0)
Anion gap: 10 (ref 5–15)
BUN: 27 mg/dL — AB (ref 6–20)
CALCIUM: 9.1 mg/dL (ref 8.9–10.3)
CHLORIDE: 107 mmol/L (ref 101–111)
CO2: 25 mmol/L (ref 22–32)
CREATININE: 1.18 mg/dL (ref 0.61–1.24)
GFR calc Af Amer: >60 mL/min (ref >60)
GFR, EST NON AFRICAN AMERICAN: 57 mL/min — AB (ref >60)
Glucose, Bld: 141 mg/dL — ABNORMAL HIGH (ref 65–99)
Potassium: 4.4 mmol/L (ref 3.5–5.1)
SODIUM: 142 mmol/L (ref 135–145)
Total Bilirubin: 0.9 mg/dL (ref 0.3–1.2)
Total Protein: 7.1 g/dL (ref 6.5–8.1)

## 2017-08-25 LAB — LIPID PANEL
CHOLESTEROL: 162 mg/dL (ref 0–200)
HDL: 39 mg/dL — ABNORMAL LOW (ref >40)
LDL Cholesterol: 101 mg/dL — ABNORMAL HIGH (ref 0–99)
TRIGLYCERIDES: 112 mg/dL (ref <150)
Total CHOL/HDL Ratio: 4.2 RATIO
VLDL: 22 mg/dL (ref 0–40)

## 2017-08-25 LAB — ETHANOL

## 2017-08-25 LAB — PROTIME-INR
INR: 1.14
Prothrombin Time: 14.5 seconds (ref 11.4–15.2)

## 2017-08-25 LAB — DIFFERENTIAL
BASOS ABS: 0 10*3/uL (ref 0.0–0.1)
BASOS PCT: 0 %
Eosinophils Absolute: 0.2 10*3/uL (ref 0.0–0.7)
Eosinophils Relative: 3 %
LYMPHS PCT: 26 %
Lymphs Abs: 1.9 10*3/uL (ref 0.7–4.0)
MONOS PCT: 12 %
Monocytes Absolute: 0.9 10*3/uL (ref 0.1–1.0)
NEUTROS ABS: 4.3 10*3/uL (ref 1.7–7.7)
Neutrophils Relative %: 59 %

## 2017-08-25 LAB — I-STAT CHEM 8, ED
BUN: 25 mg/dL — AB (ref 6–20)
CALCIUM ION: 1.18 mmol/L (ref 1.15–1.40)
Chloride: 107 mmol/L (ref 101–111)
Creatinine, Ser: 1.1 mg/dL (ref 0.61–1.24)
GLUCOSE: 137 mg/dL — AB (ref 65–99)
HCT: 49 % (ref 39.0–52.0)
Hemoglobin: 16.7 g/dL (ref 13.0–17.0)
POTASSIUM: 4.3 mmol/L (ref 3.5–5.1)
Sodium: 144 mmol/L (ref 135–145)
TCO2: 26 mmol/L (ref 22–32)

## 2017-08-25 LAB — GLUCOSE, CAPILLARY
GLUCOSE-CAPILLARY: 174 mg/dL — AB (ref 65–99)
Glucose-Capillary: 101 mg/dL — ABNORMAL HIGH (ref 65–99)

## 2017-08-25 LAB — APTT: APTT: 37 s — AB (ref 24–36)

## 2017-08-25 LAB — I-STAT TROPONIN, ED: Troponin i, poc: 0 ng/mL (ref 0.00–0.08)

## 2017-08-25 LAB — HEMOGLOBIN A1C
HEMOGLOBIN A1C: 6.4 % — AB (ref 4.8–5.6)
Mean Plasma Glucose: 136.98 mg/dL

## 2017-08-25 LAB — MRSA PCR SCREENING: MRSA by PCR: NEGATIVE

## 2017-08-25 MED ORDER — STROKE: EARLY STAGES OF RECOVERY BOOK
Freq: Once | Status: AC
  Administered 2017-08-28: 1
  Filled 2017-08-25: qty 1

## 2017-08-25 MED ORDER — IOPAMIDOL (ISOVUE-370) INJECTION 76%
50.0000 mL | Freq: Once | INTRAVENOUS | Status: AC | PRN
  Administered 2017-08-25: 50 mL via INTRAVENOUS

## 2017-08-25 MED ORDER — INSULIN ASPART 100 UNIT/ML ~~LOC~~ SOLN
4.0000 [IU] | Freq: Three times a day (TID) | SUBCUTANEOUS | Status: DC
  Administered 2017-08-26: 4 [IU] via SUBCUTANEOUS

## 2017-08-25 MED ORDER — PROTHROMBIN COMPLEX CONC HUMAN 500 UNITS IV KIT
4416.0000 [IU] | PACK | Status: AC
  Administered 2017-08-25: 4416 [IU] via INTRAVENOUS
  Filled 2017-08-25: qty 4416

## 2017-08-25 MED ORDER — AMLODIPINE BESYLATE 10 MG PO TABS
10.0000 mg | ORAL_TABLET | Freq: Every day | ORAL | Status: DC
  Administered 2017-08-26 – 2017-08-31 (×6): 10 mg via ORAL
  Filled 2017-08-25 (×6): qty 1

## 2017-08-25 MED ORDER — SENNOSIDES-DOCUSATE SODIUM 8.6-50 MG PO TABS
1.0000 | ORAL_TABLET | Freq: Two times a day (BID) | ORAL | Status: DC
  Administered 2017-08-25 – 2017-08-31 (×11): 1 via ORAL
  Filled 2017-08-25 (×11): qty 1

## 2017-08-25 MED ORDER — PAROXETINE HCL 20 MG PO TABS
20.0000 mg | ORAL_TABLET | Freq: Every day | ORAL | Status: DC
  Administered 2017-08-26 – 2017-08-31 (×6): 20 mg via ORAL
  Filled 2017-08-25 (×7): qty 1

## 2017-08-25 MED ORDER — ACETAMINOPHEN 650 MG RE SUPP: 650.0000 mg | RECTAL | Status: DC | PRN

## 2017-08-25 MED ORDER — INSULIN ASPART 100 UNIT/ML ~~LOC~~ SOLN
0.0000 [IU] | Freq: Three times a day (TID) | SUBCUTANEOUS | Status: DC
  Administered 2017-08-26 – 2017-08-31 (×5): 2 [IU] via SUBCUTANEOUS

## 2017-08-25 MED ORDER — LABETALOL HCL 5 MG/ML IV SOLN
20.0000 mg | Freq: Once | INTRAVENOUS | Status: AC
  Administered 2017-08-25: 20 mg via INTRAVENOUS
  Filled 2017-08-25: qty 4

## 2017-08-25 MED ORDER — LISINOPRIL 20 MG PO TABS
20.0000 mg | ORAL_TABLET | Freq: Every day | ORAL | Status: DC
  Administered 2017-08-26 – 2017-08-31 (×6): 20 mg via ORAL
  Filled 2017-08-25 (×6): qty 1

## 2017-08-25 MED ORDER — CLEVIDIPINE BUTYRATE 0.5 MG/ML IV EMUL: 0.0000 mg/h | INTRAVENOUS | Status: DC

## 2017-08-25 MED ORDER — GADOBENATE DIMEGLUMINE 529 MG/ML IV SOLN
15.0000 mL | Freq: Once | INTRAVENOUS | Status: AC
  Administered 2017-08-25: 15 mL via INTRAVENOUS

## 2017-08-25 MED ORDER — ALPRAZOLAM 0.5 MG PO TABS
0.5000 mg | ORAL_TABLET | Freq: Three times a day (TID) | ORAL | Status: DC | PRN
  Administered 2017-08-27: 0.5 mg via ORAL
  Filled 2017-08-25: qty 1

## 2017-08-25 MED ORDER — ACETAMINOPHEN 160 MG/5ML PO SOLN: 650.0000 mg | ORAL | Status: DC | PRN

## 2017-08-25 MED ORDER — PANTOPRAZOLE SODIUM 40 MG IV SOLR
40.0000 mg | Freq: Every day | INTRAVENOUS | Status: DC
  Administered 2017-08-25 – 2017-08-26 (×2): 40 mg via INTRAVENOUS
  Filled 2017-08-25 (×2): qty 40

## 2017-08-25 MED ORDER — ACETAMINOPHEN 325 MG PO TABS: 650.0000 mg | ORAL_TABLET | ORAL | Status: DC | PRN

## 2017-08-25 MED ORDER — METOPROLOL TARTRATE 12.5 MG HALF TABLET
12.5000 mg | ORAL_TABLET | Freq: Two times a day (BID) | ORAL | Status: DC
  Administered 2017-08-25 – 2017-08-31 (×11): 12.5 mg via ORAL
  Filled 2017-08-25 (×11): qty 1

## 2017-08-25 MED ORDER — ATORVASTATIN CALCIUM 80 MG PO TABS
80.0000 mg | ORAL_TABLET | Freq: Every day | ORAL | Status: DC
  Administered 2017-08-25: 80 mg via ORAL
  Filled 2017-08-25 (×2): qty 1

## 2017-08-25 MED ORDER — ROSUVASTATIN CALCIUM 20 MG PO TABS: 20.0000 mg | ORAL_TABLET | Freq: Every day | ORAL | Status: DC

## 2017-08-25 MED ORDER — SODIUM CHLORIDE 0.9 % IV SOLN
INTRAVENOUS | Status: DC
  Administered 2017-08-25: 75 mL/h via INTRAVENOUS
  Administered 2017-08-26: 06:00:00 via INTRAVENOUS

## 2017-08-25 MED ORDER — IOPAMIDOL (ISOVUE-370) INJECTION 76%
INTRAVENOUS | Status: AC
  Filled 2017-08-25: qty 50

## 2017-08-25 MED ORDER — INSULIN ASPART 100 UNIT/ML ~~LOC~~ SOLN
0.0000 [IU] | Freq: Every day | SUBCUTANEOUS | Status: DC
Start: 2017-08-25 — End: 2017-08-31

## 2017-08-25 MED ORDER — FINASTERIDE 5 MG PO TABS
5.0000 mg | ORAL_TABLET | Freq: Every day | ORAL | Status: DC
  Administered 2017-08-25 – 2017-08-31 (×7): 5 mg via ORAL
  Filled 2017-08-25 (×7): qty 1

## 2017-08-25 MED ORDER — NICARDIPINE HCL IN NACL 20-0.86 MG/200ML-% IV SOLN
0.0000 mg/h | INTRAVENOUS | Status: DC
  Administered 2017-08-25: 7.5 mg/h via INTRAVENOUS
  Administered 2017-08-25: 5 mg/h via INTRAVENOUS
  Administered 2017-08-25: 7.5 mg/h via INTRAVENOUS
  Administered 2017-08-26: 5 mg/h via INTRAVENOUS
  Administered 2017-08-26: 10 mg/h via INTRAVENOUS
  Administered 2017-08-26: 5 mg/h via INTRAVENOUS
  Administered 2017-08-26: 15 mg/h via INTRAVENOUS
  Filled 2017-08-25 (×6): qty 200

## 2017-08-25 NOTE — ED Notes (Signed)
Per EMS pt was at grocery store, sudden onset slurred speech, disorientation starting at approximately 0930; Pt brought to [facility] via ems;  pt unable to express age, birthday with [facility]

## 2017-08-25 NOTE — ED Notes (Signed)
Dr. Cheral Marker returned page. Notified him that the pt is here and the room that the pt is in.

## 2017-08-25 NOTE — ED Notes (Signed)
Pt in MRI will complete neuro upon arrival to room

## 2017-08-25 NOTE — ED Provider Notes (Signed)
[facility] EMERGENCY DEPARTMENT Provider Note   CSN: [id number] Arrival date & time: 08/25/17  1023   An emergency department physician performed an initial assessment on this suspected stroke patient at 1030.  History   Chief Complaint Chief Complaint  Patient presents with  . Code Stroke    HPI [name] is a 77 y.o. male.  The history is provided by the patient and the EMS personnel. The history is limited by the condition of the patient (acuity of condition).  Pt was seen at 1025.  Per EMS and pt report: While in a store, pt c/o sudden onset right sided weakness and confusion at 0930 PTA. Pt states he woke up 0700/0730 this morning "not feeling well" but cannot further elaborate. Pt told EMS he has had mild confusion "for a while," but that it "was worse" today. Denies CP/palpitations, no SOB/cough, no fevers, no abd pain, no N/V/D.    Past Medical History:  Diagnosis Date  . Anxiety   . Atrial fibrillation ([place])   . BPH (benign prostatic hypertrophy)   . Cataracts, both eyes   . Coronary artery disease   . Depressed   . Hyperlipidemia   . Hypertension   . MI (myocardial infarction) ([place])   . OSA (obstructive sleep apnea)     Patient Active Problem List   Diagnosis Date Noted  . NSTEMI (non-ST elevated myocardial infarction) ([place])   . Diabetes mellitus type 2, noninsulin dependent ([place])   . Chest pain 06/24/2015  . Hyperglycemia 06/24/2015  . Pain in the chest   . Atrial fibrillation ([place]) 08/23/2009  . PLANTAR FASCIITIS, LEFT 12/31/2008  . TOBACCO ABUSE 04/10/2008  . REDNESS OR DISCHARGE OF EYE 03/09/2008  . RUQ PAIN 08/22/2007  . NEOPLASM, SKIN, UNCERTAIN BEHAVIOR 28/31/5176  . WEIGHT GAIN 03/17/2007  . SLEEP APNEA, OBSTRUCTIVE 09/22/2006  . MYOCARDIAL INFARCTION, HX OF 09/01/2006  . FATIGUE 09/01/2006  . HYPERLIPIDEMIA 06/22/2006  . ANXIETY 06/22/2006  . Depression 06/22/2006  . Hypertension 06/22/2006  . CAD (coronary artery disease)  06/22/2006  . BPH (benign prostatic hyperplasia) 06/22/2006    Past Surgical History:  Procedure Laterality Date  . CARDIAC CATHETERIZATION N/A 06/26/2015   Procedure: Left Heart Cath and Coronary Angiography;  Surgeon: Burnell Blanks, MD;  Location: [place] [facility];  Service: Cardiovascular;  Laterality: N/A;  . CARDIAC CATHETERIZATION  06/26/2015   Procedure: Coronary Stent Intervention;  Surgeon: Burnell Blanks, MD;  Location: [place] [facility];  Service: Cardiovascular;;  . CARDIAC CATHETERIZATION  06/26/2015   Procedure: Coronary Balloon Angioplasty;  Surgeon: Burnell Blanks, MD;  Location: [place] [facility];  Service: Cardiovascular;;  . CORONARY STENT PLACEMENT  10/2004        Home Medications    Prior to Admission medications   Medication Sig Start Date End Date Taking? Authorizing Provider  ALPRAZolam Duanne Moron) 0.5 MG tablet Take 1 tablet (0.5 mg total) by mouth 3 (three) times daily as needed for anxiety. 04/08/16   Susy Frizzle, MD  amLODipine (NORVASC) 10 MG tablet TAKE 1 TABLET BY MOUTH DAILY 11/02/16   Susy Frizzle, MD  aspirin 81 MG tablet Take 81 mg by mouth daily.     [provider]  clopidogrel (PLAVIX) 75 MG tablet TAKE 1 TABLET(75 MG) BY MOUTH DAILY 07/06/16   Susy Frizzle, MD  finasteride (PROSCAR) 5 MG tablet TAKE 1 BY MOUTH DAILY 08/22/14   Susy Frizzle, MD  lisinopril (PRINIVIL,ZESTRIL) 20 MG tablet  TAKE 1 TABLET(20 MG) BY MOUTH DAILY 05/20/17   Susy Frizzle, MD  metFORMIN (GLUCOPHAGE) 500 MG tablet TAKE 1 TABLET BY MOUTH TWICE DAILY WITH MEALS 05/25/17   Susy Frizzle, MD  metoprolol tartrate (LOPRESSOR) 25 MG tablet Take 0.5 tablets (12.5 mg total) by mouth 2 (two) times daily. Reported on 10/01/2015 10/01/15   Susy Frizzle, MD  nitroGLYCERIN (NITROSTAT) 0.4 MG SL tablet ONE TABLET UNDER TONGUE AS NEEDED FOR CHEST PAIN EVERY 5 MINUTES- TAKE A MAX OF 3 TABLETS BEFORE ER VISIT 07/05/15   Susy Frizzle, MD    PARoxetine (PAXIL) 20 MG tablet TAKE 1 TABLET BY MOUTH DAILY 09/21/16   Susy Frizzle, MD  rosuvastatin (CRESTOR) 20 MG tablet TAKE 1 BY MOUTH DAILY NO FURTHER REFILLS WITHOUT FOLLOW UP LAB WORK 07/30/15   Susy Frizzle, MD  XARELTO 20 MG TABS tablet TAKE 1 TABLET BY MOUTH DAILY WITH SUPPER 07/06/17   Susy Frizzle, MD    Family History Family History  Problem Relation Age of Onset  . Sleep apnea Son     Social History Social History   Tobacco Use  . Smoking status: Current Every Day Smoker    Packs/day: 1.00    Years: 50.00    Pack years: 50.00  Substance Use Topics  . Alcohol use: No  . Drug use: No     Allergies   Patient has no known allergies.   Review of Systems Review of Systems  Unable to perform ROS: Acuity of condition     Physical Exam Updated Vital Signs BP (!) 143/79 (BP Location: Left Arm)   Pulse (!) 38   Temp 98.1 F (36.7 C) (Oral)   Ht 6' (1.829 m)   Wt 89.4 kg (197 lb)   SpO2 95%   BMI 26.72 kg/m     Physical Exam 1025: Physical examination:  Nursing notes reviewed; Vital signs and O2 SAT reviewed;  Constitutional: Well developed, Well nourished, Well hydrated, In no acute distress; Head:  Normocephalic, atraumatic; Eyes: EOMI, PERRL, No scleral icterus; ENMT: Mouth and pharynx normal, Mucous membranes moist; Neck: Supple, Full range of motion, No lymphadenopathy; Cardiovascular: Irregular rate and rhythm, No gallop; Respiratory: Breath sounds clear & equal bilaterally, No wheezes.  Speaking full sentences with ease, Normal respiratory effort/excursion; Chest: Nontender, Movement normal; Abdomen: Soft, Nontender, Nondistended, Normal bowel sounds; Genitourinary: No CVA tenderness; Extremities: Peripheral pulses normal, No tenderness, No edema, No calf edema or asymmetry.; Neuro: AA&Ox3, Major CN grossly intact. +left lower facial droop. Speech intermittently slurred but otherwise is clear. Right grip < left. Strength 4/5 RUE, otherwise  5/5 LUE and bilat UE's. No gross focal sensory deficits in extremities.; Skin: Color normal, Warm, Dry.    ED Treatments / Results  Labs (all labs ordered are listed, but only abnormal results are displayed)   EKG EKG Interpretation  Date/Time:  Wednesday August 25 2017 10:57:38 EDT Ventricular Rate:  77 PR Interval:    QRS Duration: 96 QT Interval:  349 QTC Calculation: 395 R Axis:   60 Text Interpretation:  Atrial fibrillation Anterior infarct, old Borderline repolarization abnormality When compared with ECG of 06/27/2015 No significant change was found Confirmed by Francine Graven [phone]) on 08/25/2017 11:01:28 AM   Radiology   Procedures Procedures (including critical care time)  Medications Ordered in ED Medications - No data to display   Initial Impression / Assessment and Plan / ED Course  I have reviewed the triage vital signs and the  nursing notes.  Pertinent labs & imaging results that were available during my care of the patient were reviewed by me and considered in my medical decision making (see chart for details).  MDM Reviewed: previous chart, nursing note and vitals Reviewed previous: labs and ECG Interpretation: labs, ECG and CT scan Total time providing critical care: 30-74 minutes. This excludes time spent performing separately reportable procedures and services. Consults: neurology   CRITICAL CARE Performed by: Alfonzo Feller Total critical care time: 40 minutes Critical care time was exclusive of separately billable procedures and treating other patients. Critical care was necessary to treat or prevent imminent or life-threatening deterioration. Critical care was time spent personally by me on the following activities: development of treatment plan with patient and/or surrogate as well as nursing, discussions with consultants, evaluation of patient's response to treatment, examination of patient, obtaining history from patient or surrogate,  ordering and performing treatments and interventions, ordering and review of laboratory studies, ordering and review of radiographic studies, pulse oximetry and re-evaluation of patient's condition.   Results for orders placed or performed during the hospital encounter of 08/25/17  Ethanol  Result Value Ref Range   Alcohol, Ethyl (B) <10 <10 mg/dL  Protime-INR  Result Value Ref Range   Prothrombin Time 14.5 11.4 - 15.2 seconds   INR 1.14   APTT  Result Value Ref Range   aPTT 37 (H) 24 - 36 seconds  CBC  Result Value Ref Range   WBC 7.4 4.0 - 10.5 K/uL   RBC 5.23 4.22 - 5.81 MIL/uL   Hemoglobin 15.1 13.0 - 17.0 g/dL   HCT 48.0 39.0 - 52.0 %   MCV 91.8 78.0 - 100.0 fL   MCH 28.9 26.0 - 34.0 pg   MCHC 31.5 30.0 - 36.0 g/dL   RDW 15.0 11.5 - 15.5 %   Platelets 183 150 - 400 K/uL  Differential  Result Value Ref Range   Neutrophils Relative % 59 %   Neutro Abs 4.3 1.7 - 7.7 K/uL   Lymphocytes Relative 26 %   Lymphs Abs 1.9 0.7 - 4.0 K/uL   Monocytes Relative 12 %   Monocytes Absolute 0.9 0.1 - 1.0 K/uL   Eosinophils Relative 3 %   Eosinophils Absolute 0.2 0.0 - 0.7 K/uL   Basophils Relative 0 %   Basophils Absolute 0.0 0.0 - 0.1 K/uL  I-Stat Chem 8, ED  (not at [facility], [facility])  Result Value Ref Range   Sodium 144 135 - 145 mmol/L   Potassium 4.3 3.5 - 5.1 mmol/L   Chloride 107 101 - 111 mmol/L   BUN 25 (H) 6 - 20 mg/dL   Creatinine, Ser 1.10 0.61 - 1.24 mg/dL   Glucose, Bld 137 (H) 65 - 99 mg/dL   Calcium, Ion 1.18 1.15 - 1.40 mmol/L   TCO2 26 22 - 32 mmol/L   Hemoglobin 16.7 13.0 - 17.0 g/dL   HCT 49.0 39.0 - 52.0 %  I-stat troponin, ED (not at [facility], [facility])  Result Value Ref Range   Troponin i, poc 0.00 0.00 - 0.08 ng/mL   Comment 3           Ct Head Code Stroke Wo Contrast Result Date: 08/25/2017 CLINICAL DATA:  Code stroke. 77 year old male with right side weakness since 0930 hours. EXAM: CT HEAD WITHOUT CONTRAST TECHNIQUE: Contiguous axial images were obtained from the  base of the skull through the vertex without intravenous contrast. COMPARISON:  None. FINDINGS: Brain: Oval hyperdense hemorrhage occupying the  left thalamus encompasses 20 x 26 x 29 millimeters (AP by transverse by CC) for an estimated intra-axial blood volume of 8 milliliters. There is mild surrounding edema and regional mass effect. No intraventricular or extra-axial extension. Elsewhere there are small dystrophic calcifications in the basal ganglia, and a coarse dystrophic calcification in the left cerebellar hemisphere which appears to be associated with a small chronic left cerebellar infarct. No other intracranial hemorrhage identified. Confluent and widespread bilateral cerebral white matter hypodensity. Bilateral deep gray matter heterogeneity is associated. No cerebral cortical encephalomalacia is identified. No midline shift, ventriculomegaly, mass effect, or acute cortically based infarct. Vascular: Extensive intracranial calcified atherosclerosis with prominent involvement of the right MCA. No suspicious intracranial vascular hyperdensity. Skull: Negative. Sinuses/Orbits: Moderate ethmoid sinus mucosal thickening. Mild frontal sinus mucosal thickening. Other visible paranasal sinuses and mastoids are well pneumatized. Other: Postoperative changes to both globes. Otherwise negative orbits soft tissues. Visualized scalp soft tissues are within normal limits. ASPECTS [facility] Stroke Program Early CT Score) Not applicable, acute hemorrhage. IMPRESSION: 1. Oval left thalamic hemorrhage with an estimated blood volume of 8 mL. Mild surrounding edema and regional mass effect. No intraventricular or extra-axial extension of blood. 2. Underlying advanced chronic small vessel disease, indicating likely small vessel etiology of #1. 3. 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 4. Extensive intracranial calcified atherosclerosis. Critical Value/emergent results were called by telephone at  the time of interpretation on 08/25/2017 at 1052 hours to Dr. Francine Graven , who verbally acknowledged these results. Electronically Signed   By: Genevie Ann M.D.   On: 08/25/2017 10:52    1025:  Code Stroke called on pt's arrival to ED.   1115:  CT as above. Will order K-Centra to reverse xarelto (pt states he has been compliant taking this medication).  No change in pt assessment: pt is talking easily with multiple visitors at bedside, resps easy, awake/alert, NAD. T/C returned from [facility] Dr. Cheral Marker, case discussed, including:  HPI, pertinent PM/SHx, VS/PE, dx testing, ED course and treatment: agrees with K-Centra (infuse before transport), maintain SBP 140's, agreeable to accept transfer/admit, requests to transport to ED and have ED page him to see pt. [facility] EDP and Agricultural consultant given report.      Final Clinical Impressions(s) / ED Diagnoses   Final diagnoses:  None    ED Discharge Orders    None       Francine Graven, DO 08/28/17 1302

## 2017-08-25 NOTE — ED Notes (Signed)
Gave report to EMS

## 2017-08-25 NOTE — ED Notes (Signed)
Pt returned from MRI °

## 2017-08-25 NOTE — ED Notes (Signed)
Patient transported to CT 

## 2017-08-25 NOTE — ED Notes (Signed)
Eugene [name], [place] at bedside

## 2017-08-25 NOTE — ED Triage Notes (Signed)
Pt woke up not feeling well this morning with confusion. Noted right sided weakness approx 0930 . Brought in by EMS from store due to confusion

## 2017-08-25 NOTE — ED Notes (Signed)
Per MD, will wait until she gives [place] permission from neurologist to give [facility]

## 2017-08-25 NOTE — ED Notes (Signed)
Dr. Cheral Marker repaged due to no call back.

## 2017-08-25 NOTE — ED Notes (Signed)
Per neurologist pt can transport to MRI unaccompanied, MRI called and aware, neuro checks to be completed Q1H until tomorrow per neurology MD

## 2017-08-25 NOTE — Progress Notes (Signed)
Code stroke  10:25 call time 10:25 Beeper time 10:31 Exam started 10:35 Exam finished 10:35  Sent to pacs \ 10:41 Exam complete in Pac 10:41 [facility] called

## 2017-08-25 NOTE — ED Notes (Signed)
Per Dr. Thurnell Garbe, neurologists states to give [facility]

## 2017-08-25 NOTE — ED Notes (Signed)
Dr. Cheral Marker w/ Neuro paged at pt arrival.

## 2017-08-25 NOTE — ED Notes (Signed)
Called [facility] AT 1210 to give report. Nurse stated she would call me back

## 2017-08-25 NOTE — Consult Note (Addendum)
Neurohospitalist Consult Note       Requesting Physician: [facility]    Chief Complaint: Left internal capsule/thalamic bleed  History obtained from:  Patient     HPI:                                                                                                                                         Eugene [name] is an 77 y.o. male with history of hypertension, hyperlipidemia and atrial fibrillation for which he is on Xarelto.  At baseline the patient is quite independent, drives a car, was able to take care of all of his activities of daily living.  However, per family he may not have been keeping up with his medications in terms of exact doses and schedules, with the patient confirming this. He went to sleep last night around 10 PM and woke around 930 this a.m., noting that he had decreased sensation along his right side along with significant dysarthria.  For that reason he was seen at the [facility] ED, where a CT of his head revealed a left thalamic bleed.  Blood pressure was 157/98.  Patient is not a good historian and it is unclear what his blood pressures usually are.  Date last known well: Date: 08/24/2017 Time last known well: Time: 20:00 tPA Given: No: Intracranial bleed   Modified Rankin: Rankin Score=0  Intracerebral Hemorrhage (ICH) Score 0  Past Medical History:  Diagnosis Date  . Anxiety   . Atrial fibrillation ([place])   . BPH (benign prostatic hypertrophy)   . Cataracts, both eyes   . Coronary artery disease   . Depressed   . Hyperlipidemia   . Hypertension   . MI (myocardial infarction) ([place])   . OSA (obstructive sleep apnea)     Past Surgical History:  Procedure Laterality Date  . CARDIAC CATHETERIZATION N/A 06/26/2015   Procedure: Left Heart Cath and Coronary Angiography;  Surgeon: Burnell Blanks, MD;  Location: [place] [facility];  Service: Cardiovascular;  Laterality: N/A;  . CARDIAC CATHETERIZATION  06/26/2015   Procedure: Coronary  Stent Intervention;  Surgeon: Burnell Blanks, MD;  Location: [place] [facility];  Service: Cardiovascular;;  . CARDIAC CATHETERIZATION  06/26/2015   Procedure: Coronary Balloon Angioplasty;  Surgeon: Burnell Blanks, MD;  Location: [place] [facility];  Service: Cardiovascular;;  . CORONARY STENT PLACEMENT  10/2004    Family History  Problem Relation Age of Onset  . Sleep apnea Son      Social History:  reports that he has been smoking.  He has a 50.00 pack-year smoking history. He does not have any smokeless tobacco history on file. He reports that he does not drink alcohol or use drugs.  Allergies: No Known Allergies  Medications:  Current Facility-Administered Medications  Medication Dose Route Frequency Provider Last Rate Last Dose  .  stroke: mapping our early stages of recovery book   Does not apply Once Kerney Elbe, MD      . 0.9 %  sodium chloride infusion   Intravenous Continuous Kerney Elbe, MD      . acetaminophen (TYLENOL) tablet 650 mg  650 mg Oral Q4H PRN Kerney Elbe, MD       Or  . acetaminophen (TYLENOL) solution 650 mg  650 mg Per Tube Q4H PRN Kerney Elbe, MD       Or  . acetaminophen (TYLENOL) suppository 650 mg  650 mg Rectal Q4H PRN Kerney Elbe, MD      . ALPRAZolam Duanne Moron) tablet 0.5 mg  0.5 mg Oral TID PRN Kerney Elbe, MD      . amLODipine (NORVASC) tablet 10 mg  10 mg Oral Daily Kerney Elbe, MD      . clevidipine (CLEVIPREX) infusion 0.5 mg/mL  0-21 mg/hr Intravenous Continuous Kerney Elbe, MD      . finasteride (PROSCAR) tablet 5 mg  5 mg Oral Daily Kerney Elbe, MD      . insulin aspart (novoLOG) injection 0-15 Units  0-15 Units Subcutaneous TID WC Kerney Elbe, MD      . insulin aspart (novoLOG) injection 0-5 Units  0-5 Units Subcutaneous QHS Kerney Elbe, MD      . insulin aspart (novoLOG) injection 4 Units  4  Units Subcutaneous TID WC Kerney Elbe, MD      . lisinopril (PRINIVIL,ZESTRIL) tablet 20 mg  20 mg Oral Daily Kerney Elbe, MD      . metoprolol tartrate (LOPRESSOR) tablet 12.5 mg  12.5 mg Oral BID Kerney Elbe, MD      . nicardipine (CARDENE) 20mg  in 0.86% saline 248ml IV infusion (0.1 mg/ml)  0-15 mg/hr Intravenous Continuous Sherwood Gambler, MD      . pantoprazole (PROTONIX) injection 40 mg  40 mg Intravenous QHS Kerney Elbe, MD      . PARoxetine (PAXIL) tablet 20 mg  20 mg Oral Daily Kerney Elbe, MD      . rosuvastatin (CRESTOR) tablet 20 mg  20 mg Oral Daily Kerney Elbe, MD      . senna-docusate (Senokot-S) tablet 1 tablet  1 tablet Oral BID Kerney Elbe, MD       Current Outpatient Medications  Medication Sig Dispense Refill  . ALPRAZolam (XANAX) 0.5 MG tablet Take 1 tablet (0.5 mg total) by mouth 3 (three) times daily as needed for anxiety. 60 tablet 0  . amLODipine (NORVASC) 10 MG tablet TAKE 1 TABLET BY MOUTH DAILY 90 tablet 3  . finasteride (PROSCAR) 5 MG tablet TAKE 1 BY MOUTH DAILY 90 tablet 3  . lisinopril (PRINIVIL,ZESTRIL) 20 MG tablet TAKE 1 TABLET(20 MG) BY MOUTH DAILY 90 tablet 0  . metFORMIN (GLUCOPHAGE) 500 MG tablet TAKE 1 TABLET BY MOUTH TWICE DAILY WITH MEALS 180 tablet 0  . metoprolol tartrate (LOPRESSOR) 25 MG tablet Take 0.5 tablets (12.5 mg total) by mouth 2 (two) times daily. Reported on 10/01/2015 60 tablet 5  . nitroGLYCERIN (NITROSTAT) 0.4 MG SL tablet ONE TABLET UNDER TONGUE AS NEEDED FOR CHEST PAIN EVERY 5 MINUTES- TAKE A MAX OF 3 TABLETS BEFORE ER VISIT 25 tablet 0  . PARoxetine (PAXIL) 20 MG tablet TAKE 1 TABLET BY MOUTH DAILY 90 tablet 3  . rosuvastatin (CRESTOR) 20 MG tablet TAKE 1 BY MOUTH DAILY NO FURTHER REFILLS WITHOUT FOLLOW UP LAB WORK  90 tablet 0     ROS:                                                                                                                                       History obtained from the patient  General ROS:  negative for - chills, fatigue, fever, night sweats, weight gain or weight loss Psychological ROS: negative for - , hallucinations, memory difficulties, mood swings or  Ophthalmic ROS: negative for - blurry vision, double vision, eye pain or loss of vision ENT ROS: negative for - epistaxis, nasal discharge, oral lesions, sore throat, tinnitus or vertigo Respiratory ROS: negative for - cough,  shortness of breath or wheezing Cardiovascular ROS: negative for - chest pain, dyspnea on exertion,  Gastrointestinal ROS: negative for - abdominal pain, diarrhea,  nausea/vomiting or stool incontinence Genito-Urinary ROS: negative for - dysuria, hematuria, incontinence or urinary frequency/urgency Musculoskeletal ROS: negative for - joint swelling or muscular weakness Neurological ROS: as noted in HPI   General Examination:                                                                                                      Blood pressure (!) 154/98, pulse 79, temperature 98.1 F (36.7 C), temperature source Oral, resp. rate 11, height 6' (1.829 m), weight 89.4 kg (197 lb), SpO2 99 %.  HEENT-  Normocephalic, no lesions, without obvious abnormality.  Normal external eye and conjunctiva.   Cardiovascular- S1-S2 audible, pulses palpable throughout   Lungs-no rhonchi or wheezing noted, no excessive working breathing.  Saturations within normal limits Abdomen- All 4 quadrants palpated and nontender Extremities- Warm, dry and intact Musculoskeletal-no joint tenderness, deformity or swelling Skin-warm and dry, no hyperpigmentation, vitiligo, or suspicious lesions  Neurological Examination Mental Status: Alert, oriented, poor naming, poor concentration and cognitive/memory issues.  Speech mildly dysarthric but without expressive aphasia.  Trouble comprehending when asked to follow several motor commands. Cranial Nerves: II:  Visual fields intact bilaterally. No extinction to DSS.  III,IV, VI: Ptosis not  present, EOMI without nystagmus V,VII: Smile symmetric, facial light touch decreased lower face on right. Temp sensation equal bilaterally VIII: hearing intact to voice IX,X: uvula rises symmetrically XI: bilateral shoulder shrug XII: midline tongue extension Motor: Right : Upper extremity   5/5    Left:     Upper extremity   5/5  Lower extremity   5/5     Lower extremity   5/5 --Positive pronator drift on the right. Orbiting hands  test: left hand orbits right hand. Tone and bulk:normal tone throughout; no atrophy noted Sensory: Light touch sensation decreased in the right arm and leg Deep Tendon Reflexes: 2+ and symmetric throughout Plantars: Right: downgoing   Left: downgoing Cerebellar: ataxic finger-to-nose and H-S on right, normal on the left Gait: normal gait and station   Lab Results: Basic Metabolic Panel: Recent Labs  Lab 08/25/17 1034 08/25/17 1038  NA 142 144  K 4.4 4.3  CL 107 107  CO2 25  --   GLUCOSE 141* 137*  BUN 27* 25*  CREATININE 1.18 1.10  CALCIUM 9.1  --     CBC: Recent Labs  Lab 08/25/17 1034 08/25/17 1038  WBC 7.4  --   NEUTROABS 4.3  --   HGB 15.1 16.7  HCT 48.0 49.0  MCV 91.8  --   PLT 183  --     Lipid Panel: No results for input(s): CHOL, TRIG, HDL, CHOLHDL, VLDL, LDLCALC in the last 168 hours.  CBG: No results for input(s): GLUCAP in the last 168 hours.  Imaging: Ct Head Code Stroke Wo Contrast  Result Date: 08/25/2017 CLINICAL DATA:  Code stroke. 77 year old male with right side weakness since 0930 hours. EXAM: CT HEAD WITHOUT CONTRAST TECHNIQUE: Contiguous axial images were obtained from the base of the skull through the vertex without intravenous contrast. COMPARISON:  None. FINDINGS: Brain: Oval hyperdense hemorrhage occupying the left thalamus encompasses 20 x 26 x 29 millimeters (AP by transverse by CC) for an estimated intra-axial blood volume of 8 milliliters. There is mild surrounding edema and regional mass effect. No  intraventricular or extra-axial extension. Elsewhere there are small dystrophic calcifications in the basal ganglia, and a coarse dystrophic calcification in the left cerebellar hemisphere which appears to be associated with a small chronic left cerebellar infarct. No other intracranial hemorrhage identified. Confluent and widespread bilateral cerebral white matter hypodensity. Bilateral deep gray matter heterogeneity is associated. No cerebral cortical encephalomalacia is identified. No midline shift, ventriculomegaly, mass effect, or acute cortically based infarct. Vascular: Extensive intracranial calcified atherosclerosis with prominent involvement of the right MCA. No suspicious intracranial vascular hyperdensity. Skull: Negative. Sinuses/Orbits: Moderate ethmoid sinus mucosal thickening. Mild frontal sinus mucosal thickening. Other visible paranasal sinuses and mastoids are well pneumatized. Other: Postoperative changes to both globes. Otherwise negative orbits soft tissues. Visualized scalp soft tissues are within normal limits. ASPECTS [facility] Stroke Program Early CT Score) Not applicable, acute hemorrhage. IMPRESSION: 1. Oval left thalamic hemorrhage with an estimated blood volume of 8 mL. Mild surrounding edema and regional mass effect. No intraventricular or extra-axial extension of blood. 2. Underlying advanced chronic small vessel disease, indicating likely small vessel etiology of #1. 3. 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 4. Extensive intracranial calcified atherosclerosis. Critical Value/emergent results were called by telephone at the time of interpretation on 08/25/2017 at 1052 hours to Dr. Francine Graven , who verbally acknowledged these results. Electronically Signed   By: Genevie Ann M.D.   On: 08/25/2017 10:52    History and exam documented by Etta Quill PA-C, [facility] [phone] 08/25/2017, 1:28 PM   Assessment: 77 y.o. male presenting with  acute eft thalamic hemorrhage. Symptoms of new onset right facial droop, right-sided decreased sensation and dysarthria.  There is also a component of difficulty with concentration, naming, comprehension and memory.  Unclear if the latter findings are new as family members state that he may have some mild cognitive impairment at baseline. 1. CT at OSH showed acute left thalamic  hemorrhage 2. Wandalee Ferdinand was administered at OSH 3. Atrial fibrillation  4. No listed history of DM2, but is on an oral hypoglycemic as an outpatient per Epic. The patient cannot name his medications and family is also not aware of what he takes  Recommendations: 1. Admit to ICU under Neurology service 2. MRI/MRA of head 3. PT consult, OT consult, Speech consult 4. Cardiac telemetry 5. Frequent neuro checks 6. Stop Eliquis. Given ICH while on oral anticoagulation it should be stopped indefinitely. If hemorrhage resorbing with no complications at 2-4 weeks, can consider starting ASA for stroke prevention in the setting of his atrial fibrillation. He also states that he has a cardiac stent, which also is an indication for ASA. 7. BP management with labetalol and titratable clevidipine drip has been ordered. SBP goal of < 140 8. SSI for now. Contact PCP to determine if he has DM2 and when he was prescribed his oral hypoglycemic agent. Of note,  HgbA1C was slightly elevated at 6.1 in 2017. Will obtain repeat HgbA1C.  8. Continue his home BP meds.   Electronically signed: Dr. Kerney Elbe  Addendum:  MRI/MRA resulted: 1. 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 2. Left thalamic hemorrhage is stable from the CT earlier today with no ventricular or extra-axial extension. Mild surrounding edema and regional mass effect. 3. Underlying advanced chronic small vessel disease in the deep gray matter  and cerebral white matter, with several other chronic micro hemorrhages in the left hemisphere. 4. Anterior circulation atherosclerosis primarily at the right MCA M1 segment where there is up to moderate stenosis.  Images personally reviewed. The basilar artery finding may represent occlusion or severe stenosis; some artifact on the source images decreases specificity. It appears most likely to be chronic. CTA head and neck has been ordered to further assess. May be a candidate for stenting but will defer to AM stroke team. Switch rosuvastatin to high dose atorvastatin. Obtain baseline CK level.    Electronically signed: Dr. Kerney Elbe

## 2017-08-25 NOTE — ED Notes (Signed)
Pt unable to tell the month and his age. States I dont know. Speech slurred at times and expressing himself. Able to follow commands. Had difficulty identified objects such as feather and called hammock a tent

## 2017-08-25 NOTE — ED Notes (Signed)
Beeped out Code Stroke @ 2297

## 2017-08-25 NOTE — ED Notes (Signed)
Pt returned from CT °

## 2017-08-25 NOTE — Consult Note (Signed)
[facility] Tele[facility]urology Consult Services Date of service: 08/25/2017  Impression: 77 year old male with ICH.     Not a tpa candidate due to: intracranial hemorrhage Symptoms not consistent with LVO so no indication for thrombectomy   Differential Diagnosis:   1. Cardioembolic stroke  2. Small vessel disease/lacune  3. Thromboembolic, artery-to-artery mechanism  4. Hypercoagulable state-related infarct  5. Transient ischemic attack  6. Thrombotic mechanism, large artery disease   Comments:   [facility] contacted: 10:34 [facility] at bedside: 10:37 NIHSS assessment time: 10:50 Last seen normal: 08/24/2017  Recommendations:  -Hold antiplatelets and anticoagulation -SBP < 140 -PT/OT/SLP inpatient neurology consultation Inpatient stroke evaluation as per Neurology/ Internal Medicine Discussed with ED MD  -----------------------------------------------------------------------------------------  CC: Stroke alert  History of Present Illness   Patient is a 77 year old male with a history of atrial fibrillation on Xarelto who presents to the ED because he was not feeling well this morning when he woke up and then started having right side weakness and left facial droop.   Diagnostic: CT brain w/o contrast: Left basal gangila hemorrhage and small left cerebellar hemorrhage.   Exam:  NIHSS score: 10  NIH Stroke Scale   Interval: Baseline Time: 10:42 AM Person Administering Scale: Tsosie Billing  Administer stroke scale items in the order listed. Record performance in each category after each subscale exam. Do not go back and change scores. Follow directions provided for each exam technique. Scores should reflect what the patient does, not what the clinician thinks the patient can do. The clinician should record answers while administering the exam and work quickly. Except where indicated, the patient should not be coached (i.e., repeated requests to  patient to make a special effort).   1a  Level of consciousness: 0=alert; keenly responsive  1b. LOC questions:  0=Performs both tasks correctly  1c. LOC commands: 2=Performs neither task correctly  2.  Best Gaze: 0=normal  3.  Visual: 0=No visual loss  4. Facial Palsy: 1=Minor paralysis (flattened nasolabial fold, asymmetric on smiling)  5a.  Motor left arm: 0=No drift, limb holds 90 (or 45) degrees for full 10 seconds  5b.  Motor right arm: 1=Drift, limb holds 90 (or 45) degrees but drifts down before full 10 seconds: does not hit bed  6a. motor left leg: 0=No drift, limb holds 90 (or 45) degrees for full 10 seconds  6b  Motor right leg:  1=Drift, limb holds 90 (or 45) degrees but drifts down before full 10 seconds: does not hit bed  7. Limb Ataxia: 2=Present in two limbs  8.  Sensory: 1=Mild to moderate sensory loss; patient feels pinprick is less sharp or is dull on the affected side; there is a loss of superficial pain with pinprick but patient is aware He is being touched  9. Best Language:  1=Mild to moderate aphasia; some obvious loss of fluency or facility of comprehension without significant limitation on ideas expressed or form of expression.  10. Dysarthria: 1=Mild to moderate, patient slurs at least some words and at worst, can be understood with some difficulty  11. Extinction and Inattention: 0=No abnormality  12. Distal motor function: 0=Normal   Total:   10    Medical Decision Making:  - Extensive number of diagnosis or management options are considered above.   - Extensive amount of complex data reviewed.   - High risk of complication and/or morbidity or mortality are associated with differential diagnostic considerations above.  - There may be Uncertain outcome and  increased probability of prolonged functional impairment or high probability of severe prolonged functional impairment associated with some of these differential diagnosis.  Medical Data Reviewed:  1.Data  reviewed include clinical labs, radiology,  Medical Tests;   2.Tests results discussed w/performing or interpreting physician;   3.Obtaining/reviewing old medical records;  4.Obtaining case history from another source;  5.Independent review of image, tracing or specimen.    Patient was informed the Neurology Consult would happen via telehealth (remote video) and consented to receiving care in this manner.

## 2017-08-25 NOTE — ED Provider Notes (Signed)
Patient arrived from [facility].  He is in no acute distress.  Neurology is at the bedside admitting the patient.  His blood pressure is 154/98.  He will be started on nicardipine.   Sherwood Gambler, MD 08/25/17 [phone]

## 2017-08-25 NOTE — ED Notes (Signed)
Notified Dr. Thurnell Garbe that pharmacy said that [facility] will be another 15 mins. She has called Dr. Cheral Marker and notified him. He wants pt to wait here until it's given.

## 2017-08-26 ENCOUNTER — Inpatient Hospital Stay (HOSPITAL_COMMUNITY): Payer: Medicare Other

## 2017-08-26 DIAGNOSIS — I651 Occlusion and stenosis of basilar artery: Secondary | ICD-10-CM

## 2017-08-26 DIAGNOSIS — Z7901 Long term (current) use of anticoagulants: Secondary | ICD-10-CM

## 2017-08-26 DIAGNOSIS — G8191 Hemiplegia, unspecified affecting right dominant side: Secondary | ICD-10-CM

## 2017-08-26 DIAGNOSIS — R482 Apraxia: Secondary | ICD-10-CM

## 2017-08-26 DIAGNOSIS — I618 Other nontraumatic intracerebral hemorrhage: Principal | ICD-10-CM

## 2017-08-26 DIAGNOSIS — G4733 Obstructive sleep apnea (adult) (pediatric): Secondary | ICD-10-CM

## 2017-08-26 DIAGNOSIS — E785 Hyperlipidemia, unspecified: Secondary | ICD-10-CM

## 2017-08-26 DIAGNOSIS — I639 Cerebral infarction, unspecified: Secondary | ICD-10-CM

## 2017-08-26 DIAGNOSIS — I10 Essential (primary) hypertension: Secondary | ICD-10-CM

## 2017-08-26 DIAGNOSIS — R4701 Aphasia: Secondary | ICD-10-CM

## 2017-08-26 DIAGNOSIS — I481 Persistent atrial fibrillation: Secondary | ICD-10-CM

## 2017-08-26 LAB — ECHOCARDIOGRAM COMPLETE
HEIGHTINCHES: 72 in
WEIGHTICAEL: 3008.84 [oz_av]

## 2017-08-26 LAB — RAPID URINE DRUG SCREEN, HOSP PERFORMED
Amphetamines: NOT DETECTED
BARBITURATES: NOT DETECTED
Benzodiazepines: NOT DETECTED
Cocaine: NOT DETECTED
Opiates: NOT DETECTED
Tetrahydrocannabinol: NOT DETECTED

## 2017-08-26 LAB — GLUCOSE, CAPILLARY
GLUCOSE-CAPILLARY: 109 mg/dL — AB (ref 65–99)
GLUCOSE-CAPILLARY: 130 mg/dL — AB (ref 65–99)
Glucose-Capillary: 115 mg/dL — ABNORMAL HIGH (ref 65–99)
Glucose-Capillary: 138 mg/dL — ABNORMAL HIGH (ref 65–99)

## 2017-08-26 LAB — PROTIME-INR
INR: 0.99
INR: 1.05
PROTHROMBIN TIME: 13.6 s (ref 11.4–15.2)
Prothrombin Time: 13 seconds (ref 11.4–15.2)

## 2017-08-26 MED ORDER — HALOPERIDOL LACTATE 5 MG/ML IJ SOLN
INTRAMUSCULAR | Status: AC
  Filled 2017-08-26: qty 1

## 2017-08-26 MED ORDER — CLEVIDIPINE BUTYRATE 0.5 MG/ML IV EMUL
0.0000 mg/h | INTRAVENOUS | Status: DC
  Administered 2017-08-26: 1 mg/h via INTRAVENOUS
  Administered 2017-08-26: 4 mg/h via INTRAVENOUS
  Administered 2017-08-27: 1 mg/h via INTRAVENOUS
  Filled 2017-08-26 (×3): qty 50

## 2017-08-26 MED ORDER — QUETIAPINE FUMARATE 25 MG PO TABS
12.5000 mg | ORAL_TABLET | Freq: Once | ORAL | Status: DC
  Filled 2017-08-26: qty 1

## 2017-08-26 MED ORDER — LORAZEPAM 2 MG/ML IJ SOLN
1.0000 mg | Freq: Once | INTRAMUSCULAR | Status: AC
  Administered 2017-08-26: 1 mg via INTRAVENOUS
  Filled 2017-08-26: qty 1

## 2017-08-26 MED ORDER — ROSUVASTATIN CALCIUM 20 MG PO TABS
20.0000 mg | ORAL_TABLET | Freq: Every day | ORAL | Status: DC
  Administered 2017-08-26 – 2017-08-31 (×6): 20 mg via ORAL
  Filled 2017-08-26 (×6): qty 1

## 2017-08-26 MED ORDER — HYDRALAZINE HCL 20 MG/ML IJ SOLN: 5.0000 mg | INTRAMUSCULAR | Status: DC | PRN

## 2017-08-26 MED ORDER — HALOPERIDOL LACTATE 5 MG/ML IJ SOLN
5.0000 mg | Freq: Once | INTRAMUSCULAR | Status: AC
  Administered 2017-08-26: 5 mg via INTRAVENOUS
  Filled 2017-08-26: qty 1

## 2017-08-26 MED ORDER — FUROSEMIDE 10 MG/ML IJ SOLN
40.0000 mg | Freq: Once | INTRAMUSCULAR | Status: AC
  Administered 2017-08-26: 40 mg via INTRAVENOUS
  Filled 2017-08-26: qty 4

## 2017-08-26 MED ORDER — HALOPERIDOL LACTATE 5 MG/ML IJ SOLN: 2.0000 mg | Freq: Four times a day (QID) | INTRAMUSCULAR | Status: DC | PRN

## 2017-08-26 MED ORDER — HYDRALAZINE HCL 20 MG/ML IJ SOLN
5.0000 mg | INTRAMUSCULAR | Status: DC | PRN
  Administered 2017-08-26 (×2): 10 mg via INTRAVENOUS
  Administered 2017-08-27: 5 mg via INTRAVENOUS
  Administered 2017-08-28: 10 mg via INTRAVENOUS
  Administered 2017-08-30: 5 mg via INTRAVENOUS
  Administered 2017-08-31: 10 mg via INTRAVENOUS
  Filled 2017-08-26 (×6): qty 1

## 2017-08-26 MED ORDER — METOPROLOL TARTRATE 5 MG/5ML IV SOLN
5.0000 mg | Freq: Once | INTRAVENOUS | Status: AC
  Administered 2017-08-26: 5 mg via INTRAVENOUS
  Filled 2017-08-26: qty 5

## 2017-08-26 MED ORDER — HALOPERIDOL LACTATE 5 MG/ML IJ SOLN
2.0000 mg | Freq: Once | INTRAMUSCULAR | Status: AC
  Administered 2017-08-26: 2 mg via INTRAVENOUS

## 2017-08-26 MED ORDER — LABETALOL HCL 5 MG/ML IV SOLN
20.0000 mg | INTRAVENOUS | Status: DC | PRN
  Administered 2017-08-26: 20 mg via INTRAVENOUS
  Filled 2017-08-26: qty 4

## 2017-08-26 NOTE — Evaluation (Addendum)
Occupational Therapy Evaluation Patient Details Name: Eugene [name] MRN: [id number] DOB: 01/30/1941 Today's Date: 08/26/2017    History of Present Illness pt is a 77 y/o male with pmh signifiance for HTN, afib admitted through [facility] ED with decreased sensation right side along with significant dysarthria.  CT showed a left thalamic bleed.  Per chart there is a component of difficulty with concentration, naming, comprehension and memory.  MRI showed the hemorrhage is stable with mild edema and regional shift.   Clinical Impression   Per family report, pt independent with ADL PTA. Currently pt requires mod assist +2 for functional mobility with max cues for sequencing, safety, and to correct strong R lateral lean. Pt mod-max assist overall for ADL due to balance, cognitive/communicative, and ?visual deficits. In addition, pt presenting with R sided coordination deficits and R inattention during functional tasks. Recommending CIR level therapies to maximize independence and safety with ADL and functional mobility prior to return home. Pt would benefit from continued skilled OT to address established goals.    Follow Up Recommendations  CIR;Supervision/Assistance - 24 hour    Equipment Recommendations  Other (comment)(TBD at next venue)    Recommendations for Other Services Rehab consult     Precautions / Restrictions Precautions Precautions: Fall Restrictions Weight Bearing Restrictions: No      Mobility Bed Mobility Overal bed mobility: Needs Assistance Bed Mobility: Supine to Sit     Supine to sit: Min assist     General bed mobility comments: Cues throughout for sequencing and safety. Assist with RLE to EOB and for balance during sitting  Transfers Overall transfer level: Needs assistance Equipment used: 2 person hand held assist Transfers: Sit to/from Stand Sit to Stand: Mod assist;+2 physical assistance;+2 safety/equipment         General transfer comment:  Min-mod assist to power up from EOB/chair; pt impulsive and stands without warning. Blocked R knee as pt was scooting off EOB on multiple attempts at sit to stand. Mod assist required for standing balance with strong R lateral lean initially    Balance Overall balance assessment: Needs assistance Sitting-balance support: Feet supported;Bilateral upper extremity supported Sitting balance-Leahy Scale: Poor Sitting balance - Comments: R lateral lean requiring min-max assist for upright sitting balance Postural control: Right lateral lean Standing balance support: Bilateral upper extremity supported Standing balance-Leahy Scale: Poor                             ADL either performed or assessed with clinical judgement   ADL Overall ADL's : Needs assistance/impaired Eating/Feeding: Minimal assistance;Sitting   Grooming: Minimal assistance;Sitting   Upper Body Bathing: Moderate assistance;Sitting   Lower Body Bathing: Maximal assistance;Sit to/from stand   Upper Body Dressing : Moderate assistance;Sitting   Lower Body Dressing: Maximal assistance;Sit to/from stand Lower Body Dressing Details (indicate cue type and reason): Significant difficulty with processing and sequencing of task in addition to balance deficits and RUE coordination deficits Toilet Transfer: Moderate assistance;+2 for physical assistance;Ambulation;RW Toilet Transfer Details (indicate cue type and reason): Simulated by sit to stand from EOB with functional mobility; pt fatigues with functional tasks         Functional mobility during ADLs: Moderate assistance;+2 for physical assistance;Rolling walker       Vision Baseline Vision/History: Wears glasses Wears Glasses: At all times Patient Visual Report: No change from baseline Vision Assessment?: Vision impaired- to be further tested in functional context Additional Comments: Appears to  have R inattention; difficult to assess due to cognitive and  communication impairments. Needs further assessments.     Perception     Praxis      Pertinent Vitals/Pain Pain Assessment: No/denies pain     Hand Dominance Right   Extremity/Trunk Assessment Upper Extremity Assessment Upper Extremity Assessment: RUE deficits/detail RUE Deficits / Details: Grossly 4/5. Poor coordination. R inattention noted during functional tasks. Reports sensation intact RUE Coordination: decreased fine motor;decreased gross motor   Lower Extremity Assessment Lower Extremity Assessment: Defer to PT evaluation   Cervical / Trunk Assessment Cervical / Trunk Assessment: Kyphotic   Communication Communication Communication: Receptive difficulties;Expressive difficulties   Cognition Arousal/Alertness: Awake/alert Behavior During Therapy: Restless;Impulsive Overall Cognitive Status: Impaired/Different from baseline Area of Impairment: Attention;Memory;Following commands;Safety/judgement;Awareness;Problem solving                   Current Attention Level: Focused Memory: Decreased short-term memory Following Commands: Follows one step commands inconsistently Safety/Judgement: Decreased awareness of safety;Decreased awareness of deficits Awareness: Intellectual Problem Solving: Slow processing;Decreased initiation;Difficulty sequencing;Requires verbal cues;Requires tactile cues General Comments: Poor attention to task, requires multimodal cues for safe participation in functional activities   General Comments       Exercises     Shoulder Instructions      Home Living Family/patient expects to be discharged to:: Private residence Living Arrangements: Alone Available Help at Discharge: Family;Available 24 hours/day Type of Home: House Home Access: Stairs to enter     Home Layout: One level     Bathroom Shower/Tub: Tub/shower unit;Door   [employer] Toilet: Standard     Home Equipment: None          Prior Functioning/Environment Level  of Independence: Independent        Comments: drives, active, does yard work        OT Problem List: Decreased strength;Decreased activity tolerance;Impaired balance (sitting and/or standing);Impaired vision/perception;Decreased coordination;Decreased cognition;Decreased safety awareness;Decreased knowledge of use of DME or AE;Impaired UE functional use      OT Treatment/Interventions: Self-care/ADL training;Therapeutic exercise;Neuromuscular education;Energy conservation;DME and/or AE instruction;Therapeutic activities;Cognitive remediation/compensation;Visual/perceptual remediation/compensation;Patient/family education;Balance training    OT Goals(Current goals can be found in the care plan section) Acute Rehab OT Goals Patient Stated Goal: none stated OT Goal Formulation: With patient/family Time For Goal Achievement: 09/09/17 Potential to Achieve Goals: Good ADL Goals Pt Will Perform Grooming: with min guard assist;standing Pt Will Transfer to Toilet: with min assist;ambulating;bedside commode Additional ADL Goal #1: Pt will demonstrate sustained attention during ADL in a non distracting environment. Additional ADL Goal #2: Pt will follow one step command 75% of the time during ADL.  OT Frequency: Min 3X/week   Barriers to D/C:            Co-evaluation PT/OT/SLP Co-Evaluation/Treatment: Yes Reason for Co-Treatment: Complexity of the patient's impairments (multi-system involvement);Necessary to address cognition/behavior during functional activity;For patient/therapist safety   OT goals addressed during session: ADL's and self-care      AM-PAC PT "6 Clicks" Daily Activity     Outcome Measure Help from another person eating meals?: A Little Help from another person taking care of personal grooming?: A Little Help from another person toileting, which includes using toliet, bedpan, or urinal?: A Lot Help from another person bathing (including washing, rinsing, drying)?: A  Lot Help from another person to put on and taking off regular upper body clothing?: A Lot Help from another person to put on and taking off regular lower body clothing?: A Lot 6 Click Score:  14   End of Session Equipment Utilized During Treatment: Surveyor, mining Communication: Mobility status  Activity Tolerance: Patient tolerated treatment well Patient left: in chair;with call bell/phone within reach;with chair alarm set;with family/visitor present  OT Visit Diagnosis: Unsteadiness on feet (R26.81);Other abnormalities of gait and mobility (R26.89);Muscle weakness (generalized) (M62.81);Cognitive communication deficit (R41.841)                Time: 5465-6812 OT Time Calculation (min): 37 min Charges:  OT General Charges $OT Visit: 1 Visit OT Evaluation $OT Eval Moderate Complexity: 1 Mod G-Codes:     Yassmine Tamm A. Ulice Brilliant, M.S., OTR/L Pager: [place] 08/26/2017, 3:02 PM

## 2017-08-26 NOTE — Progress Notes (Signed)
OT Cancellation Note  Patient Details Name: Eugene [name] MRN: [id number] DOB: February 04, 1941   Cancelled Treatment:    Reason Eval/Treat Not Completed: Active bedrest order  [place], OT/L  [phone] 08/26/2017 08/26/2017, 6:59 AM

## 2017-08-26 NOTE — Progress Notes (Signed)
Inpatient Rehabilitation  Per SLP and OT request, patient was screened by Gunnar Fusi for appropriateness for an Inpatient Acute Rehab consult.  At this time we are recommending an Inpatient Rehab consult.  Please order if you are agreeable.    Carmelia Roller., CCC/SLP Admission Coordinator  [place]  Cell [phone]

## 2017-08-26 NOTE — Progress Notes (Signed)
Pt recently reached max dose/volume of Cardene, with concurrent increased O2 demands.  Dr. Erlinda Hong notified, orders to replace Cardene with Cleviprex + 1X dose of 40 mg IV Lasix.

## 2017-08-26 NOTE — Progress Notes (Signed)
  Echocardiogram 2D Echocardiogram has been performed.  Darlina Sicilian M 08/26/2017, 9:46 AM

## 2017-08-26 NOTE — Evaluation (Signed)
Clinical/Bedside Swallow Evaluation Patient Details  Name: MERVIN RAMIRES MRN: [id number] Date of Birth: 1941-01-22  Today's Date: 08/26/2017 Time: SLP Start Time (ACUTE ONLY): 1110 SLP Stop Time (ACUTE ONLY): 1132 SLP Time Calculation (min) (ACUTE ONLY): 22 min  Past Medical History:  Past Medical History:  Diagnosis Date  . Anxiety   . Atrial fibrillation ([place])   . BPH (benign prostatic hypertrophy)   . Cataracts, both eyes   . Coronary artery disease   . Depressed   . Hyperlipidemia   . Hypertension   . MI (myocardial infarction) ([place])   . OSA (obstructive sleep apnea)    Past Surgical History:  Past Surgical History:  Procedure Laterality Date  . CARDIAC CATHETERIZATION N/A 06/26/2015   Procedure: Left Heart Cath and Coronary Angiography;  Surgeon: Burnell Blanks, MD;  Location: [place];  Service: Cardiovascular;  Laterality: N/A;  . CARDIAC CATHETERIZATION  06/26/2015   Procedure: Coronary Stent Intervention;  Surgeon: Burnell Blanks, MD;  Location: [place] [facility];  Service: Cardiovascular;;  . CARDIAC CATHETERIZATION  06/26/2015   Procedure: Coronary Balloon Angioplasty;  Surgeon: Burnell Blanks, MD;  Location: [place] [facility];  Service: Cardiovascular;;  . CORONARY STENT PLACEMENT  10/2004   HPI:  Mr. TRAYVION EMBLETON is a 77 y.o. 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 No tPA given due to left thalamic small ICH likely due to HTN in the setting of Xarelto and plavix use s/p [facility].    Assessment / Plan / Recommendation Clinical Impression  Pt presents with a thalamic aphasia with intact ability to repeat and read with minimal paraphasias, but significantly decreased fluency and accuracy with spontaneous speech with severe sound and word substitutions. Receptive deficits also significant with any added linguistic complexity. 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 Offered basic compensatory strategies for pt and his daughter; pt would benefit from inpatient rehab for intensive therapeutic interventions. Will follow acutely for functional communication.  SLP Visit Diagnosis: Aphasia (R47.01)    Aspiration Risk       Diet Recommendation          Other  Recommendations     Follow up Recommendations Inpatient Rehab      Frequency and Duration min 2x/week          Prognosis        Swallow Study   General HPI: Mr. DHAVAL WOO is a 77 y.o. 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 No tPA given due to left thalamic small ICH likely due to HTN in the setting of Xarelto and plavix use s/p [facility].     Oral/Motor/Sensory Function Overall Oral Motor/Sensory Function: Within functional limits   Ice Chips     Thin Liquid      Nectar Thick     Honey Thick     Puree     Solid   GO           Herbie Baltimore, MA CCC-SLP [phone]  Lynann Beaver 08/26/2017,2:27 PM

## 2017-08-26 NOTE — Plan of Care (Signed)
Patient having difficulty communicating needs.

## 2017-08-26 NOTE — Evaluation (Signed)
Physical Therapy Evaluation Patient Details Name: Eugene [name] MRN: [id number] DOB: Sep 25, 1940 Today's Date: 08/26/2017   History of Present Illness  pt is a 77 y/o male with pmh signifiance for HTN, afib admitted through [facility] ED with decreased sensation right side along with significant dysarthria.  CT showed a left thalamic bleed.  Per chart there is a component of difficulty with concentration, naming, comprehension and memory.  MRI showed the hemorrhage is stable with mild edema and regional shift.  Clinical Impression  Pt admitted with/for SDH.  Pt requiring min to mod assist for most mobility and 2 person assist for short distance ambulation.  Pt currently limited functionally due to the problems listed. ( See problems list.)   Pt will benefit from PT to maximize function and safety in order to get ready for next venue listed below.     Follow Up Recommendations CIR    Equipment Recommendations       Recommendations for Other Services Rehab consult     Precautions / Restrictions Precautions Precautions: Fall Restrictions Weight Bearing Restrictions: No      Mobility  Bed Mobility Overal bed mobility: Needs Assistance Bed Mobility: Supine to Sit     Supine to sit: Min assist     General bed mobility comments: Cues throughout for sequencing and safety. Assist with RLE to EOB and for balance during sitting  Transfers Overall transfer level: Needs assistance Equipment used: 2 person hand held assist Transfers: Sit to/from Stand Sit to Stand: Mod assist;+2 physical assistance;+2 safety/equipment         General transfer comment: Min-mod assist to power up from EOB/chair; pt impulsive and stands without warning. Blocked R knee as pt was scooting off EOB on multiple attempts at sit to stand. Mod assist required for standing balance with strong R lateral lean initially  Ambulation/Gait Ambulation/Gait assistance: +2 physical assistance;Mod  assist Ambulation Distance (Feet): 25 Feet Assistive device: Rolling walker (2 wheeled) Gait Pattern/deviations: Step-to pattern;Decreased step length - right;Decreased step length - left;Decreased stance time - right;Decreased stride length   Gait velocity interpretation: Below normal speed for age/gender General Gait Details: paretic gait on the right, characterized less by weakness and more by incoodination.  Wide BOS with heavy list to the Right while moving forward.  Frequent need to stop, regroup and recenter into a more midline posture..  Stairs            Wheelchair Mobility    Modified Rankin (Stroke Patients Only) Modified Rankin (Stroke Patients Only) Pre-Morbid Rankin Score: No symptoms Modified Rankin: Moderately severe disability     Balance Overall balance assessment: Needs assistance Sitting-balance support: Feet supported;Bilateral upper extremity supported Sitting balance-Leahy Scale: Poor Sitting balance - Comments: R lateral lean requiring min-max assist for upright sitting balance Postural control: Right lateral lean Standing balance support: Bilateral upper extremity supported Standing balance-Leahy Scale: Poor Standing balance comment: Initially with heavy list R and R foot place wide laterally to the right, but with the correct input pt can more approximate upright, midline  orientation.`                             Pertinent Vitals/Pain Pain Assessment: No/denies pain    Home Living Family/patient expects to be discharged to:: Private residence Living Arrangements: Alone Available Help at Discharge: Family;Available 24 hours/day Type of Home: House Home Access: Stairs to enter     Home Layout: One level Home Equipment:  None      Prior Function Level of Independence: Independent         Comments: drives, active, does yard work     Journalist, newspaper   Dominant Hand: Right    Extremity/Trunk Assessment   Upper Extremity  Assessment Upper Extremity Assessment: RUE deficits/detail RUE Deficits / Details: Grossly 4/5. Poor coordination. R inattention noted during functional tasks. Reports sensation intact RUE Coordination: decreased fine motor;decreased gross motor    Lower Extremity Assessment Lower Extremity Assessment: RLE deficits/detail RLE Deficits / Details: proximal weaknesses, uncoordinated movement, but no focus weaknesses RLE Sensation: WNL RLE Coordination: decreased fine motor    Cervical / Trunk Assessment Cervical / Trunk Assessment: Kyphotic  Communication   Communication: Receptive difficulties;Expressive difficulties  Cognition Arousal/Alertness: Awake/alert Behavior During Therapy: Restless;Impulsive Overall Cognitive Status: Impaired/Different from baseline Area of Impairment: Attention;Memory;Following commands;Safety/judgement;Awareness;Problem solving                   Current Attention Level: Focused Memory: Decreased short-term memory Following Commands: Follows one step commands inconsistently Safety/Judgement: Decreased awareness of safety;Decreased awareness of deficits Awareness: Intellectual Problem Solving: Slow processing;Decreased initiation;Difficulty sequencing;Requires verbal cues;Requires tactile cues General Comments: Poor attention to task, requires multimodal cues for safe participation in functional activities      General Comments      Exercises     Assessment/Plan    PT Assessment Patient needs continued PT services  PT Problem List Decreased strength;Decreased activity tolerance;Decreased balance;Decreased mobility;Decreased coordination;Decreased safety awareness       PT Treatment Interventions Gait training;Functional mobility training;Therapeutic activities;Balance training;Neuromuscular re-education;Patient/family education;DME instruction    PT Goals (Current goals can be found in the Care Plan section)  Acute Rehab PT  Goals Patient Stated Goal: none stated PT Goal Formulation: With patient/family Time For Goal Achievement: 09/09/17 Potential to Achieve Goals: Good    Frequency Min 3X/week   Barriers to discharge        Co-evaluation PT/OT/SLP Co-Evaluation/Treatment: Yes Reason for Co-Treatment: Complexity of the patient's impairments (multi-system involvement) PT goals addressed during session: Mobility/safety with mobility OT goals addressed during session: ADL's and self-care       AM-PAC PT "6 Clicks" Daily Activity  Outcome Measure Difficulty turning over in bed (including adjusting bedclothes, sheets and blankets)?: Unable Difficulty moving from lying on back to sitting on the side of the bed? : Unable Difficulty sitting down on and standing up from a chair with arms (e.g., wheelchair, bedside commode, etc,.)?: Unable Help needed moving to and from a bed to chair (including a wheelchair)?: A Lot Help needed walking in hospital room?: A Lot Help needed climbing 3-5 steps with a railing? 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

## 2017-08-26 NOTE — Consult Note (Signed)
Physical Medicine and Rehabilitation Consult Reason for Consult: Decreased functional mobility Referring Physician: Dr.Xu   HPI: Eugene [name] is a 77 y.o. right-handed male with history of hypertension, hyperlipidemia, tobacco abuse and atrial fibrillation maintained on Xarelto and question medical compliance.  Per chart review patient lives alone independent and driving as well as active.  One level home.  Presented 08/25/2017 with right-sided weakness and aphasia.  Blood pressure 157/98.  CT the head showed oval left thalamic hemorrhage with an estimated blood volume of 8 mL.  Mild surrounding edema and regional mass-effect.  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  Neurology follow-up currently plan is for aspirin 81 mg in 5-7 days of neuro stable post ICH with plans to repeat CT in 2-3 weeks.  If ICH resolves plan was to add Eliquis to aspirin.  Echocardiogram is pending.  Occupational therapy evaluation completed with recommendations of physical medicine rehab consult.  pt with expressive aphasia   Review of Systems  Unable to perform ROS: Language   Past Medical History:  Diagnosis Date  . Anxiety   . Atrial fibrillation ([place])   . BPH (benign prostatic hypertrophy)   . Cataracts, both eyes   . Coronary artery disease   . Depressed   . Hyperlipidemia   . Hypertension   . MI (myocardial infarction) ([place])   . OSA (obstructive sleep apnea)    Past Surgical History:  Procedure Laterality Date  . CARDIAC CATHETERIZATION N/A 06/26/2015   Procedure: Left Heart Cath and Coronary Angiography;  Surgeon: Burnell Blanks, MD;   Location: [facility];  Service: Cardiovascular;  Laterality: N/A;  . CARDIAC CATHETERIZATION  06/26/2015   Procedure: Coronary Stent Intervention;  Surgeon: Burnell Blanks, MD;  Location: [facility];  Service: Cardiovascular;;  . CARDIAC CATHETERIZATION  06/26/2015   Procedure: Coronary Balloon Angioplasty;  Surgeon: Burnell Blanks, MD;  Location: [facility];  Service: Cardiovascular;;  . CORONARY STENT PLACEMENT  10/2004   Family History  Problem Relation Age of Onset  . Sleep apnea Son    Social History:  reports that he has been smoking.  He has a 50.00 pack-year smoking history. He does not have any smokeless tobacco history on file. He reports that he does not drink alcohol or use drugs. Allergies: No Known Allergies Medications Prior to Admission  Medication Sig Dispense Refill  . lisinopril (PRINIVIL,ZESTRIL) 20 MG tablet TAKE 1 TABLET(20 MG) BY MOUTH DAILY 90 tablet 0  . metFORMIN (GLUCOPHAGE) 500 MG tablet TAKE 1 TABLET BY MOUTH TWICE DAILY WITH MEALS 180 tablet 0  . ALPRAZolam (XANAX) 0.5 MG tablet Take 1 tablet (0.5 mg total) by mouth 3 (three) times daily as needed for anxiety. 60 tablet 0  . amLODipine (NORVASC) 10 MG tablet TAKE 1 TABLET BY MOUTH DAILY 90 tablet 3  . finasteride (PROSCAR) 5 MG tablet TAKE 1 BY MOUTH DAILY 90 tablet 3  . metoprolol tartrate (LOPRESSOR) 25 MG tablet Take 0.5 tablets (12.5 mg total) by mouth 2 (two) times daily. Reported on 10/01/2015 60 tablet 5  . nitroGLYCERIN (NITROSTAT) 0.4 MG SL tablet ONE TABLET UNDER TONGUE AS  NEEDED FOR CHEST PAIN EVERY 5 MINUTES- TAKE A MAX OF 3 TABLETS BEFORE ER VISIT 25 tablet 0  . PARoxetine (PAXIL) 20 MG tablet TAKE 1 TABLET BY MOUTH DAILY 90 tablet 3  . rosuvastatin (CRESTOR) 20 MG tablet TAKE 1 BY MOUTH DAILY NO FURTHER REFILLS WITHOUT FOLLOW UP LAB WORK 90 tablet 0    Home: Home Living Family/patient expects to be discharged to:: Private residence Living Arrangements:  Alone Available Help at Discharge: Family, Available 24 hours/day Type of Home: House Home Access: Stairs to enter Home Layout: One level Bathroom Shower/Tub: Public librarian, Charity fundraiser: Standard Home Equipment: None  Functional History: Prior Function Level of Independence: Independent Comments: drives, active, does yard work Functional Status:  Mobility: Bed Mobility Overal bed mobility: Needs Assistance Bed Mobility: Supine to Sit Supine to sit: Min assist General bed mobility comments: Cues throughout for sequencing and safety. Assist with RLE to EOB and for balance during sitting Transfers Overall transfer level: Needs assistance Equipment used: 2 person hand held assist Transfers: Sit to/from Stand Sit to Stand: Mod assist, +2 physical assistance, +2 safety/equipment General transfer comment: Min-mod assist to power up from EOB/chair; pt impulsive and stands without warning. Blocked R knee as pt was scooting off EOB on multiple attempts at sit to stand. Mod assist required for standing balance with strong R lateral lean initially      ADL: ADL Overall ADL's : Needs assistance/impaired Eating/Feeding: Minimal assistance, Sitting Grooming: Minimal assistance, Sitting Upper Body Bathing: Moderate assistance, Sitting Lower Body Bathing: Maximal assistance, Sit to/from stand Upper Body Dressing : Moderate assistance, Sitting Lower Body Dressing: Maximal assistance, Sit to/from stand Lower Body Dressing Details (indicate cue type and reason): Significant difficulty with processing and sequencing of task in addition to balance deficits and RUE coordination deficits Toilet Transfer: Moderate assistance, +2 for physical assistance, Ambulation, RW Toilet Transfer Details (indicate cue type and reason): Simulated by sit to stand from EOB with functional mobility; pt fatigues with functional tasks Functional mobility during ADLs: Moderate assistance, +2 for physical  assistance, Rolling walker  Cognition: Cognition Overall Cognitive Status: Impaired/Different from baseline Orientation Level: Oriented to person, Disoriented to time, Disoriented to situation, Oriented to place Cognition Arousal/Alertness: Awake/alert Behavior During Therapy: Restless, Impulsive Overall Cognitive Status: Impaired/Different from baseline Area of Impairment: Attention, Memory, Following commands, Safety/judgement, Awareness, Problem solving Current Attention Level: Focused Memory: Decreased short-term memory Following Commands: Follows one step commands inconsistently Safety/Judgement: Decreased awareness of safety, Decreased awareness of deficits Awareness: Intellectual Problem Solving: Slow processing, Decreased initiation, Difficulty sequencing, Requires verbal cues, Requires tactile cues General Comments: Poor attention to task, requires multimodal cues for safe participation in functional activities  Blood pressure (!) 152/87, pulse (!) 155, temperature 97.9 F (36.6 C), temperature source Oral, resp. rate 20, height 6' (1.829 m), weight 85.3 kg (188 lb 0.8 oz), SpO2 95 %.  General: No acute distress Mood and affect are appropriate Heart: Regular rate and rhythm no rubs murmurs or extra sounds Lungs: Clear to auscultation, breathing unlabored, no rales or wheezes Abdomen: Positive bowel sounds, soft nontender to palpation, nondistended Extremities: No clubbing, cyanosis, or edema Skin: No evidence of breakdown, no evidence of rash Neurologic: Cranial nerves II through XII intact, motor strength is 5/5 in Left deltoid, bicep, tricep, grip, hip flexor, knee extensors, ankle dorsiflexor and plantar flexor, 4/5 on Right side Sensory exam normal sensation to light touch in bilateral upper and lower extremities Cerebellar exam normal finger to nose to finger as well  as heel to shin in LEFT upper and lower extremities, mild dysmetria RUE Musculoskeletal: Full range of  motion in all 4 extremities. No joint swelling Patient has difficulty following commands even with gestural cues. Naming is severely impaired, has word substitutions and neologisms  Results for orders placed or performed during the hospital encounter of 08/25/17 (from the past 24 hour(s))  MRSA PCR Screening     Status: None   Collection Time: 08/25/17  5:30 PM  Result Value Ref Range   MRSA by PCR NEGATIVE NEGATIVE  Glucose, capillary     Status: Abnormal   Collection Time: 08/25/17  6:16 PM  Result Value Ref Range   Glucose-Capillary 101 (H) 65 - 99 mg/dL  Glucose, capillary     Status: Abnormal   Collection Time: 08/25/17 10:05 PM  Result Value Ref Range   Glucose-Capillary 174 (H) 65 - 99 mg/dL  Protime-INR     Status: None   Collection Time: 08/26/17  1:20 AM  Result Value Ref Range   Prothrombin Time 13.0 11.4 - 15.2 seconds   INR 0.99   Protime-INR     Status: None   Collection Time: 08/26/17  7:15 AM  Result Value Ref Range   Prothrombin Time 13.6 11.4 - 15.2 seconds   INR 1.05   Glucose, capillary     Status: Abnormal   Collection Time: 08/26/17  7:55 AM  Result Value Ref Range   Glucose-Capillary 115 (H) 65 - 99 mg/dL  Rapid urine drug screen (hospital performed)     Status: None   Collection Time: 08/26/17  8:15 AM  Result Value Ref Range   Opiates NONE DETECTED NONE DETECTED   Cocaine NONE DETECTED NONE DETECTED   Benzodiazepines NONE DETECTED NONE DETECTED   Amphetamines NONE DETECTED NONE DETECTED   Tetrahydrocannabinol NONE DETECTED NONE DETECTED   Barbiturates NONE DETECTED NONE DETECTED  Glucose, capillary     Status: Abnormal   Collection Time: 08/26/17 12:12 PM  Result Value Ref Range   Glucose-Capillary 109 (H) 65 - 99 mg/dL   Ct Angio Head W Or Wo Contrast  Result Date: 08/25/2017 CLINICAL DATA:  Left flank hemorrhage. Basilar artery stenosis or occlusion. EXAM: CT ANGIOGRAPHY HEAD AND NECK TECHNIQUE: Multidetector CT imaging of the head and neck  was performed using the standard protocol during bolus administration of intravenous contrast. Multiplanar CT image reconstructions and MIPs were obtained to evaluate the vascular anatomy. Carotid stenosis measurements (when applicable) are obtained utilizing NASCET criteria, using the distal internal carotid diameter as the denominator. CONTRAST:  62mL ISOVUE-370 IOPAMIDOL (ISOVUE-370) INJECTION 76% COMPARISON:  CT of the head and MRI and MRA brain from the same day. FINDINGS: Stepped CTA NECK FINDINGS Aortic arch: Extensive atherosclerotic calcifications are present at the aorta and branch vessel origins without significant stenosis or aneurysm. Right carotid system: Scattered mural calcifications are present along the right common carotid artery. Dense calcifications are present at the right carotid bifurcation. Minimal luminal diameter is 1.5 mm this compares with a more normal distal lumen of 5.8 mm. The cervical right ICA is within normal limits of this level. Left carotid system: Atherosclerotic calcifications are present along the wall the left common carotid artery without significant stenosis. Dense calcifications are present at the left carotid bifurcation without a significant stenosis relative to the more distal vessel. Vertebral arteries: Vertebral arteries vertebral arteries originate from the subclavian arteries bilaterally. There is dense calcification at the origin of the right vertebral artery. The vertebral arteries are codominant.  There are high-grade near occlusive stenoses of both vertebral arteries at the dural margin. No other stenoses are present in the neck. Skeleton: Endplate degenerative changes are most pronounced at C3-4 C5-6 and C6-7. Extensive dental disease is present. No focal lytic or blastic lesions are present. Other neck: No significant mucosal or submucosal lesions are present. The palatine tonsils are enlarged bilaterally. No discrete mass lesion is present. Vocal cords are  midline and symmetric. There is no significant adenopathy. Salivary glands are within normal limits. Multiple left-sided thyroid nodules are less than 10 mm. No dominant nodule is present. Upper chest: Lung apices are clear. No focal nodule, mass, or airspace disease present. The thoracic inlet is within normal limits. Review of the MIP images confirms the above findings CTA HEAD FINDINGS Anterior circulation: Atherosclerotic calcifications are present within the cavernous internal carotid arteries bilaterally without a significant stenosis relative to the more distal vessel through the ICA termini. The A1 and M1 segments are within normal limits. There is a proximal bifurcation of the left M1 segment, a normal variant moderate narrowing is present in the distal right M1 segment. MCA branch vessels are intact. Anterior communicating artery is patent. Moderate proximal left A2 segment stenosis is present. Distal small vessel disease is present throughout. Posterior circulation: The right AICA is dominant. A smaller left AICA is present. Neither PICA vessel is evident. There is a fenestration of the vertebrobasilar junction. There is some flow in both vertebral arteries. Stenosis is worse on the left. The basilar artery is occluded just distal to the vertebrobasilar junction at the level of the AICA vessels. A fetal type left posterior cerebral artery is present. The right posterior cerebral artery and basilar tip is reconstituted by a right posterior communicating artery. The basilar tip is reconstituted to the level of the superior cerebellar arteries. The left superior cerebellar artery is occluded or highly stenotic. Proximal PCA branch vessels are intact bilaterally. Distal PCA disease is present bilaterally. Venous sinuses: The dural sinuses are patent. Straight sinus and deep cerebral veins are intact. Cortical veins are unremarkable. Anatomic variants: Fetal type left posterior cerebral artery. Delayed phase:  Postcontrast images again demonstrate the left thalamic hemorrhage. There is no enhancement. Calcification and remote lacunar infarct of the left cerebellum are stable. Review of the MIP images confirms the above findings IMPRESSION: 1. 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 2. Reconstitution of the basilar tip to the level of the superior cerebellar arteries via a right posterior communicating artery. 3. 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 4. High-grade stenosis of the vertebral arteries at the dural margin and beyond, left greater than right. 5. 70% stenosis of the right carotid bifurcation. 6. Dense calcifications in the distal left common carotid artery and carotid bifurcation without significant stenosis relative to the more distal vessel. 7. Mild distal right M1 segment stenosis. 8. Moderate proximal left A2 segment stenosis. Electronically Signed   By: San Morelle M.D.   On: 08/25/2017 17:19   Ct Angio Neck W Or Wo Contrast  Result Date: 08/25/2017 CLINICAL DATA:  Left flank hemorrhage. Basilar artery stenosis or occlusion. EXAM: CT ANGIOGRAPHY HEAD AND NECK TECHNIQUE: Multidetector CT imaging of the head and neck was performed using the standard protocol during bolus administration of intravenous contrast. Multiplanar CT image reconstructions and MIPs were obtained to evaluate the vascular anatomy. Carotid stenosis measurements (when applicable) are obtained utilizing NASCET criteria, using the distal internal carotid diameter as the denominator.  CONTRAST:  3mL ISOVUE-370 IOPAMIDOL (ISOVUE-370) INJECTION 76% COMPARISON:  CT of the head and MRI and MRA brain from the same day. FINDINGS: Stepped CTA NECK FINDINGS Aortic arch: Extensive atherosclerotic calcifications are present at the aorta and branch vessel origins without significant stenosis or aneurysm. Right carotid system: Scattered mural calcifications are present along  the right common carotid artery. Dense calcifications are present at the right carotid bifurcation. Minimal luminal diameter is 1.5 mm this compares with a more normal distal lumen of 5.8 mm. The cervical right ICA is within normal limits of this level. Left carotid system: Atherosclerotic calcifications are present along the wall the left common carotid artery without significant stenosis. Dense calcifications are present at the left carotid bifurcation without a significant stenosis relative to the more distal vessel. Vertebral arteries: Vertebral arteries vertebral arteries originate from the subclavian arteries bilaterally. There is dense calcification at the origin of the right vertebral artery. The vertebral arteries are codominant. There are high-grade near occlusive stenoses of both vertebral arteries at the dural margin. No other stenoses are present in the neck. Skeleton: Endplate degenerative changes are most pronounced at C3-4 C5-6 and C6-7. Extensive dental disease is present. No focal lytic or blastic lesions are present. Other neck: No significant mucosal or submucosal lesions are present. The palatine tonsils are enlarged bilaterally. No discrete mass lesion is present. Vocal cords are midline and symmetric. There is no significant adenopathy. Salivary glands are within normal limits. Multiple left-sided thyroid nodules are less than 10 mm. No dominant nodule is present. Upper chest: Lung apices are clear. No focal nodule, mass, or airspace disease present. The thoracic inlet is within normal limits. Review of the MIP images confirms the above findings CTA HEAD FINDINGS Anterior circulation: Atherosclerotic calcifications are present within the cavernous internal carotid arteries bilaterally without a significant stenosis relative to the more distal vessel through the ICA termini. The A1 and M1 segments are within normal limits. There is a proximal bifurcation of the left M1 segment, a normal variant  moderate narrowing is present in the distal right M1 segment. MCA branch vessels are intact. Anterior communicating artery is patent. Moderate proximal left A2 segment stenosis is present. Distal small vessel disease is present throughout. Posterior circulation: The right AICA is dominant. A smaller left AICA is present. Neither PICA vessel is evident. There is a fenestration of the vertebrobasilar junction. There is some flow in both vertebral arteries. Stenosis is worse on the left. The basilar artery is occluded just distal to the vertebrobasilar junction at the level of the AICA vessels. A fetal type left posterior cerebral artery is present. The right posterior cerebral artery and basilar tip is reconstituted by a right posterior communicating artery. The basilar tip is reconstituted to the level of the superior cerebellar arteries. The left superior cerebellar artery is occluded or highly stenotic. Proximal PCA branch vessels are intact bilaterally. Distal PCA disease is present bilaterally. Venous sinuses: The dural sinuses are patent. Straight sinus and deep cerebral veins are intact. Cortical veins are unremarkable. Anatomic variants: Fetal type left posterior cerebral artery. Delayed phase: Postcontrast images again demonstrate the left thalamic hemorrhage. There is no enhancement. Calcification and remote lacunar infarct of the left cerebellum are stable. Review of the MIP images confirms the above findings IMPRESSION: 1. 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 2. Reconstitution of the basilar tip to the level of the superior cerebellar arteries via a right posterior communicating artery. 3. 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 4. High-grade stenosis of the vertebral arteries at the dural margin and beyond, left greater than right. 5. 70% stenosis of the right carotid bifurcation. 6. Dense calcifications in the distal left common carotid  artery and carotid bifurcation without significant stenosis relative to the more distal vessel. 7. Mild distal right M1 segment stenosis. 8. Moderate proximal left A2 segment stenosis. Electronically Signed   By: San Morelle M.D.   On: 08/25/2017 17:19   Mr Jodene Nam Head Wo Contrast  Addendum Date: 08/25/2017   ADDENDUM REPORT: 08/25/2017 15:12 ADDENDUM: This study was discussed by telephone with Dr. Kerney Elbe on 08/25/2017 at 15:12 . Electronically Signed   By: Genevie Ann M.D.   On: 08/25/2017 15:12   Result Date: 08/25/2017 CLINICAL DATA:  77 year old male with code stroke presentation and acute left thalamic hemorrhage on head CT earlier today. EXAM: MRI HEAD WITHOUT AND WITH CONTRAST MRA HEAD WITHOUT CONTRAST TECHNIQUE: Multiplanar, multiecho pulse sequences of the brain and surrounding structures were obtained without and with intravenous contrast. Angiographic images of the head were obtained using MRA technique without contrast. CONTRAST:  57mL MULTIHANCE GADOBENATE DIMEGLUMINE 529 MG/ML IV SOLN COMPARISON:  Head CT without contrast 1035 hours. FINDINGS: MRI HEAD FINDINGS Brain: Oval mixed hyper- and hypointense T2 signal and iso- to hypointense T1 signal hemorrhage re-demonstrated in the left thalamus encompassing 20 x 21 x 26 millimeters (AP by transverse by CC) for an estimated blood volume of 5-6 mL. Surrounding edema with mild regional mass effect. No intraventricular extension identified. No extra-axial extension identified. Associated diffusion susceptibility artifact. No convincing surrounding area of restricted diffusion. There are punctate areas of enhancement within the hemorrhage (series 12, images 26 and 27), but this represents delayed phase enhancement and not arterial phase spot sign. No restricted diffusion elsewhere. There are several superimposed chronic microhemorrhages elsewhere in the left hemisphere. Chronic hemosiderin and/or dystrophic calcification noted in the left  cerebellum. 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 No cortical encephalomalacia. No other abnormal intracranial enhancement. No dural thickening. No midline shift, ventriculomegaly, extra-axial collection. Cervicomedullary junction and pituitary are within normal limits. Vascular: The major anterior circulation flow voids are preserved. There is intermittent loss of the posterior circulation flow voids, see MRA findings below. The major dural venous sinuses are enhancing and appear to be patent. Skull and upper cervical spine: Negative visible cervical spine. Normal bone marrow signal. Sinuses/Orbits: Postoperative changes to both globes. Moderate ethmoid sinus mucosal thickening. Other: Visible internal auditory structures appear normal. Negative scalp and face soft tissues. MRA HEAD FINDINGS Antegrade flow in the distal vertebral arteries. Vertebral artery irregularity which may be exacerbated by motion artifact. 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 The distal 3rd of the basilar appears most occluded, although the basilar tip is reconstituted by the right posterior communicating artery. There is a fetal type left PCA origin. The SCA origins are patent. The bilateral PCA branches are patent without stenosis. There is no flow signal identified within the left thalamic hemorrhage. Antegrade flow in both ICA siphons which are mildly dolichoectatic. No siphon stenosis. Ophthalmic and right posterior communicating artery origins are normal. Patent carotid termini. Normal MCA and ACA origins. The anterior communicating artery and visible ACA branches are within normal limits. The left MCA M1 segment bifurcates early. The left MCA branches  appear within normal limits, no stenosis. There is  mild to moderate irregularity and stenosis of the right MCA M1 segment. Prominent right until a striate branch arises from this area. The right MCA bifurcation remains patent. The right MCA branches are patent with up to mild irregularity. IMPRESSION: 1. 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 2. Left thalamic hemorrhage is stable from the CT earlier today with no ventricular or extra-axial extension. Mild surrounding edema and regional mass effect. 3. 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 4. Anterior circulation atherosclerosis primarily at the right MCA M1 segment where there is up to moderate stenosis. Electronically Signed: By: Genevie Ann M.D. On: 08/25/2017 15:08   Mr Jeri Cos YQ Contrast  Addendum Date: 08/25/2017   ADDENDUM REPORT: 08/25/2017 15:12 ADDENDUM: This study was discussed by telephone with Dr. Kerney Elbe on 08/25/2017 at 15:12 . Electronically Signed   By: Genevie Ann M.D.   On: 08/25/2017 15:12   Result Date: 08/25/2017 CLINICAL DATA:  77 year old male with code stroke presentation and acute left thalamic hemorrhage on head CT earlier today. EXAM: MRI HEAD WITHOUT AND WITH CONTRAST MRA HEAD WITHOUT CONTRAST TECHNIQUE: Multiplanar, multiecho pulse sequences of the brain and surrounding structures were obtained without and with intravenous contrast. Angiographic images of the head were obtained using MRA technique without contrast. CONTRAST:  105mL MULTIHANCE GADOBENATE DIMEGLUMINE 529 MG/ML IV SOLN COMPARISON:  Head CT without contrast 1035 hours. FINDINGS: MRI HEAD FINDINGS Brain: Oval mixed hyper- and hypointense T2 signal and iso- to hypointense T1 signal hemorrhage re-demonstrated in the left thalamus encompassing 20 x 21 x 26 millimeters  (AP by transverse by CC) for an estimated blood volume of 5-6 mL. Surrounding edema with mild regional mass effect. No intraventricular extension identified. No extra-axial extension identified. Associated diffusion susceptibility artifact. No convincing surrounding area of restricted diffusion. There are punctate areas of enhancement within the hemorrhage (series 12, images 26 and 27), but this represents delayed phase enhancement and not arterial phase spot sign. No restricted diffusion elsewhere. There are several superimposed chronic microhemorrhages elsewhere in the left hemisphere. Chronic hemosiderin and/or dystrophic calcification noted in the left cerebellum. 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 No cortical encephalomalacia. No other abnormal intracranial enhancement. No dural thickening. No midline shift, ventriculomegaly, extra-axial collection. Cervicomedullary junction and pituitary are within normal limits. Vascular: The major anterior circulation flow voids are preserved. There is intermittent loss of the posterior circulation flow voids, see MRA findings below. The major dural venous sinuses are enhancing and appear to be patent. Skull and upper cervical spine: Negative visible cervical spine. Normal bone marrow signal. Sinuses/Orbits: Postoperative changes to both globes. Moderate ethmoid sinus mucosal thickening. Other: Visible internal auditory structures appear normal. Negative scalp and face soft tissues. MRA HEAD FINDINGS Antegrade flow in the distal vertebral arteries. Vertebral artery irregularity which may be exacerbated by motion artifact. 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 The distal 3rd of the basilar appears most occluded, although the basilar tip is  reconstituted by the right posterior communicating artery. There is a fetal type left PCA origin. The SCA origins are  patent. The bilateral PCA branches are patent without stenosis. There is no flow signal identified within the left thalamic hemorrhage. Antegrade flow in both ICA siphons which are mildly dolichoectatic. No siphon stenosis. Ophthalmic and right posterior communicating artery origins are normal. Patent carotid termini. Normal MCA and ACA origins. The anterior communicating artery and visible ACA branches are within normal limits. The left MCA M1 segment bifurcates early. The left MCA branches appear within normal limits, no stenosis. There is mild to moderate irregularity and stenosis of the right MCA M1 segment. Prominent right until a striate branch arises from this area. The right MCA bifurcation remains patent. The right MCA branches are patent with up to mild irregularity. IMPRESSION: 1. 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 2. Left thalamic hemorrhage is stable from the CT earlier today with no ventricular or extra-axial extension. Mild surrounding edema and regional mass effect. 3. 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 4. Anterior circulation atherosclerosis primarily at the right MCA M1 segment where there is up to moderate stenosis. Electronically Signed: By: Genevie Ann M.D. On: 08/25/2017 15:08   Dg Chest Portable 1 View  Result Date: 08/25/2017 CLINICAL DATA:  Code stroke. History of atrial fibrillation, coronary artery disease with stent placement, previous MI, current smoker. EXAM: PORTABLE CHEST 1 VIEW COMPARISON:  Chest x-ray of June 24, 2015 FINDINGS: The lungs are well-expanded. There is no focal infiltrate. The  interstitial markings are coarse though stable. The heart is top-normal in size. The pulmonary vascularity is normal. There is calcification in the wall of the aortic arch. The bony thorax exhibits no acute abnormality. IMPRESSION: Mild chronic bronchitic changes, stable. No pneumonia, CHF, nor other acute cardiopulmonary abnormality. Thoracic aortic atherosclerosis. Electronically Signed   By: David  Martinique M.D.   On: 08/25/2017 13:33   Ct Head Code Stroke Wo Contrast  Result Date: 08/25/2017 CLINICAL DATA:  Code stroke. 77 year old male with right side weakness since 0930 hours. EXAM: CT HEAD WITHOUT CONTRAST TECHNIQUE: Contiguous axial images were obtained from the base of the skull through the vertex without intravenous contrast. COMPARISON:  None. FINDINGS: Brain: Oval hyperdense hemorrhage occupying the left thalamus encompasses 20 x 26 x 29 millimeters (AP by transverse by CC) for an estimated intra-axial blood volume of 8 milliliters. There is mild surrounding edema and regional mass effect. No intraventricular or extra-axial extension. Elsewhere there are small dystrophic calcifications in the basal ganglia, and a coarse dystrophic calcification in the left cerebellar hemisphere which appears to be associated with a small chronic left cerebellar infarct. No other intracranial hemorrhage identified. Confluent and widespread bilateral cerebral white matter hypodensity. Bilateral deep gray matter heterogeneity is associated. No cerebral cortical encephalomalacia is identified. No midline shift, ventriculomegaly, mass effect, or acute cortically based infarct. Vascular: Extensive intracranial calcified atherosclerosis with prominent involvement of the right MCA. No suspicious intracranial vascular hyperdensity. Skull: Negative. Sinuses/Orbits: Moderate ethmoid sinus mucosal thickening. Mild frontal sinus mucosal thickening. Other visible paranasal sinuses and mastoids are well pneumatized. Other:  Postoperative changes to both globes. Otherwise negative orbits soft tissues. Visualized scalp soft tissues are within normal limits. ASPECTS [facility] Stroke Program Early CT Score) Not applicable, acute hemorrhage. IMPRESSION: 1. Oval left thalamic hemorrhage with an estimated blood volume of 8 mL. Mild surrounding edema and regional  mass effect. No intraventricular or extra-axial extension of blood. 2. Underlying advanced chronic small vessel disease, indicating likely small vessel etiology of #1. 3. 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 4. Extensive intracranial calcified atherosclerosis. Critical Value/emergent results were called by telephone at the time of interpretation on 08/25/2017 at 1052 hours to Dr. Francine Graven , who verbally acknowledged these results. Electronically Signed   By: Genevie Ann M.D.   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 Does the need for close, 24 hr/day medical supervision in concert with the patient's rehab needs make it unreasonable for this patient to be served in a less intensive setting? Yes 2. Co-Morbidities requiring supervision/potential complications: Hypertension, atrial fibrillation, urinary retention 3. Due to bladder management, bowel management, safety, skin/wound care, disease management, medication administration, pain management and patient education, does the patient require 24 hr/day rehab nursing? Yes 4. Does the patient require coordinated care of a physician, rehab nurse, PT (1-2 hrs/day, 5 days/week), OT (1-2 hrs/day, 5 days/week) and SLP (.5-1 hrs/day, 5 days/week) to address physical and functional deficits in the context of the above medical diagnosis(es)? Yes Addressing deficits in the following areas: balance, endurance, locomotion, strength, transferring, bowel/bladder control, bathing, dressing, feeding, grooming, toileting,  cognition, speech, language, swallowing and psychosocial support 5. Can the patient actively participate in an intensive therapy program of at least 3 hrs of therapy per day at least 5 days per week? Yes 6. The potential for patient to make measurable gains while on inpatient rehab is excellent 7. Anticipated functional outcomes upon discharge from inpatient rehab are supervision  with PT, supervision with OT, supervision and min assist with SLP. 8. Estimated rehab length of stay to reach the above functional goals is: 14-18d 9. Anticipated D/C setting: Home 10. Anticipated post D/C treatments: [place] therapy 11. Overall Rehab/Functional Prognosis: excellent  RECOMMENDATIONS: This patient's condition is appropriate for continued rehabilitative care in the following setting: CIR Patient has agreed to participate in recommended program. Yes Note that insurance prior authorization may be required for reimbursement for recommended care.  Comment:   Charlett Blake M.D. [facility] FAAPM&R (Sports Med, Neuromuscular Med) Diplomate Am Board of Electrodiagnostic Med  Elizabeth Sauer 08/26/2017

## 2017-08-26 NOTE — Progress Notes (Signed)
STROKE TEAM PROGRESS NOTE   SUBJECTIVE (INTERVAL HISTORY) His daughter and son are at the bedside.  Overall he feels his condition is stable. He still has some expressive aphasia but able to follow simple commands and repeat sentences. Subtle right side weakness and right facial droop.    OBJECTIVE Temp:  [97.6 F (36.4 C)-98.4 F (36.9 C)] 97.6 F (36.4 C) (04/11 0757) Pulse Rate:  [33-134] 98 (04/11 1000) Cardiac Rhythm: Atrial fibrillation (04/11 0758) Resp:  [11-22] 19 (04/11 1000) BP: (100-171)/(54-118) 130/82 (04/11 1000) SpO2:  [89 %-99 %] 91 % (04/11 1000) Weight:  [188 lb 0.8 oz (85.3 kg)-197 lb (89.4 kg)] 188 lb 0.8 oz (85.3 kg) (04/10 1800)  Recent Labs  Lab 08/25/17 1816 08/25/17 2205 08/26/17 0755  GLUCAP 101* 174* 115*   Recent Labs  Lab 08/25/17 1034 08/25/17 1038  NA 142 144  K 4.4 4.3  CL 107 107  CO2 25  --   GLUCOSE 141* 137*  BUN 27* 25*  CREATININE 1.18 1.10  CALCIUM 9.1  --    Recent Labs  Lab 08/25/17 1034  AST 15  ALT 14*  ALKPHOS 72  BILITOT 0.9  PROT 7.1  ALBUMIN 3.9   Recent Labs  Lab 08/25/17 1034 08/25/17 1038  WBC 7.4  --   NEUTROABS 4.3  --   HGB 15.1 16.7  HCT 48.0 49.0  MCV 91.8  --   PLT 183  --    No results for input(s): CKTOTAL, CKMB, CKMBINDEX, TROPONINI in the last 168 hours. Recent Labs    08/25/17 1034 08/26/17 0120 08/26/17 0715  LABPROT 14.5 13.0 13.6  INR 1.14 0.99 1.05   No results for input(s): COLORURINE, LABSPEC, PHURINE, GLUCOSEU, HGBUR, BILIRUBINUR, KETONESUR, PROTEINUR, UROBILINOGEN, NITRITE, LEUKOCYTESUR in the last 72 hours.  Invalid input(s): APPERANCEUR     Component Value Date/Time   CHOL 162 08/25/2017 1505   TRIG 112 08/25/2017 1505   HDL 39 (L) 08/25/2017 1505   CHOLHDL 4.2 08/25/2017 1505   VLDL 22 08/25/2017 1505   LDLCALC 101 (H) 08/25/2017 1505   Lab Results  Component Value Date   HGBA1C 6.4 (H) 08/25/2017      Component Value Date/Time   LABOPIA NONE DETECTED  08/26/2017 0815   COCAINSCRNUR NONE DETECTED 08/26/2017 0815   LABBENZ NONE DETECTED 08/26/2017 0815   AMPHETMU NONE DETECTED 08/26/2017 0815   THCU NONE DETECTED 08/26/2017 0815   LABBARB NONE DETECTED 08/26/2017 0815    Recent Labs  Lab 08/25/17 1034  ETH <10    I have personally reviewed the radiological images below and agree with the radiology interpretations.  Ct Angio Head W Or Wo Contrast  Result Date: 08/25/2017 CLINICAL DATA:  Left flank hemorrhage. Basilar artery stenosis or occlusion. EXAM: CT ANGIOGRAPHY HEAD AND NECK TECHNIQUE: Multidetector CT imaging of the head and neck was performed using the standard protocol during bolus administration of intravenous contrast. Multiplanar CT image reconstructions and MIPs were obtained to evaluate the vascular anatomy. Carotid stenosis measurements (when applicable) are obtained utilizing NASCET criteria, using the distal internal carotid diameter as the denominator. CONTRAST:  2mL ISOVUE-370 IOPAMIDOL (ISOVUE-370) INJECTION 76% COMPARISON:  CT of the head and MRI and MRA brain from the same day. FINDINGS: Stepped CTA NECK FINDINGS Aortic arch: Extensive atherosclerotic calcifications are present at the aorta and branch vessel origins without significant stenosis or aneurysm. Right carotid system: Scattered mural calcifications are present along the right common carotid artery. Dense calcifications are present at the right  carotid bifurcation. Minimal luminal diameter is 1.5 mm this compares with a more normal distal lumen of 5.8 mm. The cervical right ICA is within normal limits of this level. Left carotid system: Atherosclerotic calcifications are present along the wall the left common carotid artery without significant stenosis. Dense calcifications are present at the left carotid bifurcation without a significant stenosis relative to the more distal vessel. Vertebral arteries: Vertebral arteries vertebral arteries originate from the  subclavian arteries bilaterally. There is dense calcification at the origin of the right vertebral artery. The vertebral arteries are codominant. There are high-grade near occlusive stenoses of both vertebral arteries at the dural margin. No other stenoses are present in the neck. Skeleton: Endplate degenerative changes are most pronounced at C3-4 C5-6 and C6-7. Extensive dental disease is present. No focal lytic or blastic lesions are present. Other neck: No significant mucosal or submucosal lesions are present. The palatine tonsils are enlarged bilaterally. No discrete mass lesion is present. Vocal cords are midline and symmetric. There is no significant adenopathy. Salivary glands are within normal limits. Multiple left-sided thyroid nodules are less than 10 mm. No dominant nodule is present. Upper chest: Lung apices are clear. No focal nodule, mass, or airspace disease present. The thoracic inlet is within normal limits. Review of the MIP images confirms the above findings CTA HEAD FINDINGS Anterior circulation: Atherosclerotic calcifications are present within the cavernous internal carotid arteries bilaterally without a significant stenosis relative to the more distal vessel through the ICA termini. The A1 and M1 segments are within normal limits. There is a proximal bifurcation of the left M1 segment, a normal variant moderate narrowing is present in the distal right M1 segment. MCA branch vessels are intact. Anterior communicating artery is patent. Moderate proximal left A2 segment stenosis is present. Distal small vessel disease is present throughout. Posterior circulation: The right AICA is dominant. A smaller left AICA is present. Neither PICA vessel is evident. There is a fenestration of the vertebrobasilar junction. There is some flow in both vertebral arteries. Stenosis is worse on the left. The basilar artery is occluded just distal to the vertebrobasilar junction at the level of the AICA vessels. A  fetal type left posterior cerebral artery is present. The right posterior cerebral artery and basilar tip is reconstituted by a right posterior communicating artery. The basilar tip is reconstituted to the level of the superior cerebellar arteries. The left superior cerebellar artery is occluded or highly stenotic. Proximal PCA branch vessels are intact bilaterally. Distal PCA disease is present bilaterally. Venous sinuses: The dural sinuses are patent. Straight sinus and deep cerebral veins are intact. Cortical veins are unremarkable. Anatomic variants: Fetal type left posterior cerebral artery. Delayed phase: Postcontrast images again demonstrate the left thalamic hemorrhage. There is no enhancement. Calcification and remote lacunar infarct of the left cerebellum are stable. Review of the MIP images confirms the above findings IMPRESSION: 1. 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 2. Reconstitution of the basilar tip to the level of the superior cerebellar arteries via a right posterior communicating artery. 3. 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 4. High-grade stenosis of the vertebral arteries at the dural margin and beyond, left greater than right. 5. 70% stenosis of the right carotid bifurcation. 6. Dense calcifications in the distal left common carotid artery and carotid bifurcation without significant stenosis relative to the more distal vessel. 7. Mild distal right M1 segment stenosis. 8. Moderate proximal left A2 segment stenosis. Electronically  Signed   By: San Morelle M.D.   On: 08/25/2017 17:19   Ct Angio Neck W Or Wo Contrast  Result Date: 08/25/2017 CLINICAL DATA:  Left flank hemorrhage. Basilar artery stenosis or occlusion. EXAM: CT ANGIOGRAPHY HEAD AND NECK TECHNIQUE: Multidetector CT imaging of the head and neck was performed using the standard protocol during bolus administration of intravenous contrast. Multiplanar  CT image reconstructions and MIPs were obtained to evaluate the vascular anatomy. Carotid stenosis measurements (when applicable) are obtained utilizing NASCET criteria, using the distal internal carotid diameter as the denominator. CONTRAST:  89mL ISOVUE-370 IOPAMIDOL (ISOVUE-370) INJECTION 76% COMPARISON:  CT of the head and MRI and MRA brain from the same day. FINDINGS: Stepped CTA NECK FINDINGS Aortic arch: Extensive atherosclerotic calcifications are present at the aorta and branch vessel origins without significant stenosis or aneurysm. Right carotid system: Scattered mural calcifications are present along the right common carotid artery. Dense calcifications are present at the right carotid bifurcation. Minimal luminal diameter is 1.5 mm this compares with a more normal distal lumen of 5.8 mm. The cervical right ICA is within normal limits of this level. Left carotid system: Atherosclerotic calcifications are present along the wall the left common carotid artery without significant stenosis. Dense calcifications are present at the left carotid bifurcation without a significant stenosis relative to the more distal vessel. Vertebral arteries: Vertebral arteries vertebral arteries originate from the subclavian arteries bilaterally. There is dense calcification at the origin of the right vertebral artery. The vertebral arteries are codominant. There are high-grade near occlusive stenoses of both vertebral arteries at the dural margin. No other stenoses are present in the neck. Skeleton: Endplate degenerative changes are most pronounced at C3-4 C5-6 and C6-7. Extensive dental disease is present. No focal lytic or blastic lesions are present. Other neck: No significant mucosal or submucosal lesions are present. The palatine tonsils are enlarged bilaterally. No discrete mass lesion is present. Vocal cords are midline and symmetric. There is no significant adenopathy. Salivary glands are within normal limits.  Multiple left-sided thyroid nodules are less than 10 mm. No dominant nodule is present. Upper chest: Lung apices are clear. No focal nodule, mass, or airspace disease present. The thoracic inlet is within normal limits. Review of the MIP images confirms the above findings CTA HEAD FINDINGS Anterior circulation: Atherosclerotic calcifications are present within the cavernous internal carotid arteries bilaterally without a significant stenosis relative to the more distal vessel through the ICA termini. The A1 and M1 segments are within normal limits. There is a proximal bifurcation of the left M1 segment, a normal variant moderate narrowing is present in the distal right M1 segment. MCA branch vessels are intact. Anterior communicating artery is patent. Moderate proximal left A2 segment stenosis is present. Distal small vessel disease is present throughout. Posterior circulation: The right AICA is dominant. A smaller left AICA is present. Neither PICA vessel is evident. There is a fenestration of the vertebrobasilar junction. There is some flow in both vertebral arteries. Stenosis is worse on the left. The basilar artery is occluded just distal to the vertebrobasilar junction at the level of the AICA vessels. A fetal type left posterior cerebral artery is present. The right posterior cerebral artery and basilar tip is reconstituted by a right posterior communicating artery. The basilar tip is reconstituted to the level of the superior cerebellar arteries. The left superior cerebellar artery is occluded or highly stenotic. Proximal PCA branch vessels are intact bilaterally. Distal PCA disease is present bilaterally. Venous sinuses:  The dural sinuses are patent. Straight sinus and deep cerebral veins are intact. Cortical veins are unremarkable. Anatomic variants: Fetal type left posterior cerebral artery. Delayed phase: Postcontrast images again demonstrate the left thalamic hemorrhage. There is no enhancement.  Calcification and remote lacunar infarct of the left cerebellum are stable. Review of the MIP images confirms the above findings IMPRESSION: 1. 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 2. Reconstitution of the basilar tip to the level of the superior cerebellar arteries via a right posterior communicating artery. 3. 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 4. High-grade stenosis of the vertebral arteries at the dural margin and beyond, left greater than right. 5. 70% stenosis of the right carotid bifurcation. 6. Dense calcifications in the distal left common carotid artery and carotid bifurcation without significant stenosis relative to the more distal vessel. 7. Mild distal right M1 segment stenosis. 8. Moderate proximal left A2 segment stenosis. Electronically Signed   By: San Morelle M.D.   On: 08/25/2017 17:19   Mr Jodene Nam Head Wo Contrast  Addendum Date: 08/25/2017   ADDENDUM REPORT: 08/25/2017 15:12 ADDENDUM: This study was discussed by telephone with Dr. Kerney Elbe on 08/25/2017 at 15:12 . Electronically Signed   By: Genevie Ann M.D.   On: 08/25/2017 15:12   Result Date: 08/25/2017 CLINICAL DATA:  77 year old male with code stroke presentation and acute left thalamic hemorrhage on head CT earlier today. EXAM: MRI HEAD WITHOUT AND WITH CONTRAST MRA HEAD WITHOUT CONTRAST TECHNIQUE: Multiplanar, multiecho pulse sequences of the brain and surrounding structures were obtained without and with intravenous contrast. Angiographic images of the head were obtained using MRA technique without contrast. CONTRAST:  29mL MULTIHANCE GADOBENATE DIMEGLUMINE 529 MG/ML IV SOLN COMPARISON:  Head CT without contrast 1035 hours. FINDINGS: MRI HEAD FINDINGS Brain: Oval mixed hyper- and hypointense T2 signal and iso- to hypointense T1 signal hemorrhage re-demonstrated in the left thalamus encompassing 20 x 21 x 26 millimeters (AP by transverse by CC) for an  estimated blood volume of 5-6 mL. Surrounding edema with mild regional mass effect. No intraventricular extension identified. No extra-axial extension identified. Associated diffusion susceptibility artifact. No convincing surrounding area of restricted diffusion. There are punctate areas of enhancement within the hemorrhage (series 12, images 26 and 27), but this represents delayed phase enhancement and not arterial phase spot sign. No restricted diffusion elsewhere. There are several superimposed chronic microhemorrhages elsewhere in the left hemisphere. Chronic hemosiderin and/or dystrophic calcification noted in the left cerebellum. 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 No cortical encephalomalacia. No other abnormal intracranial enhancement. No dural thickening. No midline shift, ventriculomegaly, extra-axial collection. Cervicomedullary junction and pituitary are within normal limits. Vascular: The major anterior circulation flow voids are preserved. There is intermittent loss of the posterior circulation flow voids, see MRA findings below. The major dural venous sinuses are enhancing and appear to be patent. Skull and upper cervical spine: Negative visible cervical spine. Normal bone marrow signal. Sinuses/Orbits: Postoperative changes to both globes. Moderate ethmoid sinus mucosal thickening. Other: Visible internal auditory structures appear normal. Negative scalp and face soft tissues. MRA HEAD FINDINGS Antegrade flow in the distal vertebral arteries. Vertebral artery irregularity which may be exacerbated by motion artifact. 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 The distal 3rd of the basilar appears most occluded, although  the basilar tip is reconstituted by the right  posterior communicating artery. There is a fetal type left PCA origin. The SCA origins are patent. The bilateral PCA branches are patent without stenosis. There is no flow signal identified within the left thalamic hemorrhage. Antegrade flow in both ICA siphons which are mildly dolichoectatic. No siphon stenosis. Ophthalmic and right posterior communicating artery origins are normal. Patent carotid termini. Normal MCA and ACA origins. The anterior communicating artery and visible ACA branches are within normal limits. The left MCA M1 segment bifurcates early. The left MCA branches appear within normal limits, no stenosis. There is mild to moderate irregularity and stenosis of the right MCA M1 segment. Prominent right until a striate branch arises from this area. The right MCA bifurcation remains patent. The right MCA branches are patent with up to mild irregularity. IMPRESSION: 1. 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 2. Left thalamic hemorrhage is stable from the CT earlier today with no ventricular or extra-axial extension. Mild surrounding edema and regional mass effect. 3. 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 4. Anterior circulation atherosclerosis primarily at the right MCA M1 segment where there is up to moderate stenosis. Electronically Signed: By: Genevie Ann M.D. On: 08/25/2017 15:08   Mr Jeri Cos IW Contrast  Addendum Date: 08/25/2017   ADDENDUM REPORT: 08/25/2017 15:12 ADDENDUM: This study was discussed by telephone with Dr. Kerney Elbe on 08/25/2017 at 15:12 . Electronically Signed   By: Genevie Ann M.D.   On: 08/25/2017 15:12   Result Date: 08/25/2017 CLINICAL DATA:  78 year old male with code stroke presentation and acute left thalamic  hemorrhage on head CT earlier today. EXAM: MRI HEAD WITHOUT AND WITH CONTRAST MRA HEAD WITHOUT CONTRAST TECHNIQUE: Multiplanar, multiecho pulse sequences of the brain and surrounding structures were obtained without and with intravenous contrast. Angiographic images of the head were obtained using MRA technique without contrast. CONTRAST:  63mL MULTIHANCE GADOBENATE DIMEGLUMINE 529 MG/ML IV SOLN COMPARISON:  Head CT without contrast 1035 hours. FINDINGS: MRI HEAD FINDINGS Brain: Oval mixed hyper- and hypointense T2 signal and iso- to hypointense T1 signal hemorrhage re-demonstrated in the left thalamus encompassing 20 x 21 x 26 millimeters (AP by transverse by CC) for an estimated blood volume of 5-6 mL. Surrounding edema with mild regional mass effect. No intraventricular extension identified. No extra-axial extension identified. Associated diffusion susceptibility artifact. No convincing surrounding area of restricted diffusion. There are punctate areas of enhancement within the hemorrhage (series 12, images 26 and 27), but this represents delayed phase enhancement and not arterial phase spot sign. No restricted diffusion elsewhere. There are several superimposed chronic microhemorrhages elsewhere in the left hemisphere. Chronic hemosiderin and/or dystrophic calcification noted in the left cerebellum. 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 No cortical encephalomalacia. No other abnormal intracranial enhancement. No dural thickening. No midline shift, ventriculomegaly, extra-axial collection. Cervicomedullary junction and pituitary are within normal limits. Vascular: The major anterior circulation flow voids are preserved. There is intermittent loss of the posterior circulation flow voids, see MRA findings below. The major dural venous sinuses are enhancing and appear to be patent. Skull and upper cervical spine:  Negative visible cervical spine. Normal bone marrow signal. Sinuses/Orbits: Postoperative changes to  both globes. Moderate ethmoid sinus mucosal thickening. Other: Visible internal auditory structures appear normal. Negative scalp and face soft tissues. MRA HEAD FINDINGS Antegrade flow in the distal vertebral arteries. Vertebral artery irregularity which may be exacerbated by motion artifact. 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 The distal 3rd of the basilar appears most occluded, although the basilar tip is reconstituted by the right posterior communicating artery. There is a fetal type left PCA origin. The SCA origins are patent. The bilateral PCA branches are patent without stenosis. There is no flow signal identified within the left thalamic hemorrhage. Antegrade flow in both ICA siphons which are mildly dolichoectatic. No siphon stenosis. Ophthalmic and right posterior communicating artery origins are normal. Patent carotid termini. Normal MCA and ACA origins. The anterior communicating artery and visible ACA branches are within normal limits. The left MCA M1 segment bifurcates early. The left MCA branches appear within normal limits, no stenosis. There is mild to moderate irregularity and stenosis of the right MCA M1 segment. Prominent right until a striate branch arises from this area. The right MCA bifurcation remains patent. The right MCA branches are patent with up to mild irregularity. IMPRESSION: 1. 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 2. Left thalamic hemorrhage is stable from the CT earlier today with no ventricular or extra-axial extension. Mild surrounding edema and regional mass effect. 3. Underlying advanced chronic small vessel disease  in the deep gray matter and cerebral white matter, with several other chronic micro hemorrhages in the left hemisphere. 4. Anterior circulation atherosclerosis primarily at the right MCA M1 segment where there is up to moderate stenosis. Electronically Signed: By: Genevie Ann M.D. On: 08/25/2017 15:08   Dg Chest Portable 1 View  Result Date: 08/25/2017 CLINICAL DATA:  Code stroke. History of atrial fibrillation, coronary artery disease with stent placement, previous MI, current smoker. EXAM: PORTABLE CHEST 1 VIEW COMPARISON:  Chest x-ray of June 24, 2015 FINDINGS: The lungs are well-expanded. There is no focal infiltrate. The interstitial markings are coarse though stable. The heart is top-normal in size. The pulmonary vascularity is normal. There is calcification in the wall of the aortic arch. The bony thorax exhibits no acute abnormality. IMPRESSION: Mild chronic bronchitic changes, stable. No pneumonia, CHF, nor other acute cardiopulmonary abnormality. Thoracic aortic atherosclerosis. Electronically Signed   By: David  Martinique M.D.   On: 08/25/2017 13:33   Ct Head Code Stroke Wo Contrast  Result Date: 08/25/2017 CLINICAL DATA:  Code stroke. 77 year old male with right side weakness since 0930 hours. EXAM: CT HEAD WITHOUT CONTRAST TECHNIQUE: Contiguous axial images were obtained from the base of the skull through the vertex without intravenous contrast. COMPARISON:  None. FINDINGS: Brain: Oval hyperdense hemorrhage occupying the left thalamus encompasses 20 x 26 x 29 millimeters (AP by transverse by CC) for an estimated intra-axial blood volume of 8 milliliters. There is mild surrounding edema and regional mass effect. No intraventricular or extra-axial extension. Elsewhere there are small dystrophic calcifications in the basal ganglia, and a coarse dystrophic calcification in the left cerebellar hemisphere which appears to be associated with a small chronic left cerebellar infarct. No other intracranial  hemorrhage identified. Confluent and widespread bilateral cerebral white matter hypodensity. Bilateral deep gray matter heterogeneity is associated. No cerebral cortical encephalomalacia is identified. No midline shift,  ventriculomegaly, mass effect, or acute cortically based infarct. Vascular: Extensive intracranial calcified atherosclerosis with prominent involvement of the right MCA. No suspicious intracranial vascular hyperdensity. Skull: Negative. Sinuses/Orbits: Moderate ethmoid sinus mucosal thickening. Mild frontal sinus mucosal thickening. Other visible paranasal sinuses and mastoids are well pneumatized. Other: Postoperative changes to both globes. Otherwise negative orbits soft tissues. Visualized scalp soft tissues are within normal limits. ASPECTS [facility] Stroke Program Early CT Score) Not applicable, acute hemorrhage. IMPRESSION: 1. Oval left thalamic hemorrhage with an estimated blood volume of 8 mL. Mild surrounding edema and regional mass effect. No intraventricular or extra-axial extension of blood. 2. Underlying advanced chronic small vessel disease, indicating likely small vessel etiology of #1. 3. 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 4. Extensive intracranial calcified atherosclerosis. Critical Value/emergent results were called by telephone at the time of interpretation on 08/25/2017 at 1052 hours to Dr. Francine Graven , who verbally acknowledged these results. Electronically Signed   By: Genevie Ann M.D.   On: 08/25/2017 10:52    TTE pending   PHYSICAL EXAM  Temp:  [97.6 F (36.4 C)-98.4 F (36.9 C)] 97.6 F (36.4 C) (04/11 0757) Pulse Rate:  [33-134] 98 (04/11 1000) Resp:  [11-22] 19 (04/11 1000) BP: (100-171)/(54-118) 130/82 (04/11 1000) SpO2:  [89 %-99 %] 91 % (04/11 1000) Weight:  [188 lb 0.8 oz (85.3 kg)-197 lb (89.4 kg)] 188 lb 0.8 oz (85.3 kg) (04/10 1800)  General - Well nourished, well developed, in no apparent distress.  Ophthalmologic  - fundi not visualized due to noncooperation.  Cardiovascular - irregularly irregular heart rate and rhythm.  Mental Status -  Awake alert, orientated to self age and people but not to time, partially due to expressive aphasia. Still has partial expressive aphasia, but follows all commands, naming 1/4 and intact repeating but with mild dysarthria.  Cranial Nerves II - XII - II - Visual field intact OU. III, IV, VI - Extraocular movements intact. V - Facial sensation intact bilaterally. VII - mild right nasolabial fold flattening. VIII - Hearing & vestibular intact bilaterally. X - Palate elevates symmetrically. XI - Chin turning & shoulder shrug intact bilaterally. XII - Tongue protrusion intact.  Motor Strength - The patient's strength was normal in LUE and LLE, however, pronator drift present on the RUE and 4+/5 RLE DF only otherwise 5/5.  Bulk was normal and fasciculations were absent.   Motor Tone - Muscle tone was assessed at the neck and appendages and was normal.  Reflexes - The patient's reflexes were symmetrical in all extremities and he had no pathological reflexes.  Sensory - Light touch, temperature/pinprick were assessed and were symmetrical.    Coordination - The patient had normal movements in the hands with no ataxia or dysmetria.  Tremor was absent.  Gait and Station - deferred.   ASSESSMENT/PLAN Mr. Eugene [name] is a 77 y.o. 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 No tPA given due to [place].    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  2D Echo  pending  LDL 101  HgbA1c 6.4  SCDs for VTE prophylaxis  Fall precautions  Diet heart healthy/carb modified Room service appropriate? Yes; Fluid consistency: Thin   clopidogrel 75 mg daily and Xarelto (rivaroxaban) daily prior to admission, now on No antithrombotic due to [place]. Will consider ASA 81mg  5-7 days if neuro stable post ICH, and repeat CT in 2-3 weeks, if ICH resolves, add eliquis to ASA 81mg .   Patient counseled to be compliant with his antithrombotic medications  Ongoing aggressive stroke risk factor management  Therapy recommendations:  pending  Disposition:  Pending  Afib on Xarelto  Tele showed persistent AFib  Rate controlled  Hold off AC for now.   Will repeat CT in 2-3 weeks, if ICH resolves, start eliquis  CAD/MI  On plavix  Hold off for now due to [place]  Will consider ASA 81mg  5-7 days if neuro stable post ICH  BA occlusion  Confirmed on CTA and MRA  With distal constitution from collaterals  Consistent with chronic occlusion  Continue ASA once able to start  Avoid low BP  Hypertension Stable on cardene Taper off cardene as able  On home norvasc, lisinopril and metoprolol BP goal < 140  Long term BP goal 120-140. Avoid hypotension due to BA occlusion, avoid hypertension due to anticoagulation use.  Need BP check closely at home  Hyperlipidemia  Home meds:  crestor 20   LDL 101, goal < 70  Now on crestor 20  Continue statin at discharge  Other Stroke Risk Factors  Hx stroke/TIA - imaging showed old left cerebellar infarct with calcification  Obstructive sleep apnea  Other Active Problems    Hospital day # 1  This patient is critically ill due to [place], afib, anticoagulation reversal, HTN, BA occlusion and at significant risk of neurological worsening, death form stroke, hematoma expansion, bleeding, heart failure. This patient's care requires constant monitoring of  vital signs, hemodynamics, respiratory and cardiac monitoring, review of multiple databases, neurological assessment, discussion with family, other specialists and medical decision making of high complexity. I spent 40 minutes of neurocritical care time in the care of this patient. I had long discussion with son and daughter at bedside, updated pt current condition, treatment plan and potential prognosis. They expressed understanding and appreciation.     Rosalin Hawking, MD PhD Stroke Neurology 08/26/2017 10:19 AM    To contact Stroke Continuity provider, please refer to [email or web address] After hours, contact General Neurology

## 2017-08-26 NOTE — Progress Notes (Signed)
At 0500 SBP read 160. Cardene gtt had been off since 0000 with SBPs <140. Dr. Lorraine Lax notified and PRN labetalol order given to try to maintain SBP <140.   Follow up BP after labetalol given still >140 x3 cycles. Cardene gtt restarted.

## 2017-08-26 NOTE — Progress Notes (Signed)
Patient was agitated and required several staff to keep him in bed. Neurology was called to authorize restraints and medication was ordered to help calm the patient.

## 2017-08-27 ENCOUNTER — Inpatient Hospital Stay (HOSPITAL_COMMUNITY): Payer: Medicare Other

## 2017-08-27 DIAGNOSIS — F05 Delirium due to known physiological condition: Secondary | ICD-10-CM

## 2017-08-27 LAB — PROTIME-INR
INR: 1.07
Prothrombin Time: 13.8 seconds (ref 11.4–15.2)

## 2017-08-27 LAB — CBC
HEMATOCRIT: 42.7 % (ref 39.0–52.0)
HEMOGLOBIN: 14.1 g/dL (ref 13.0–17.0)
MCH: 30.3 pg (ref 26.0–34.0)
MCHC: 33 g/dL (ref 30.0–36.0)
MCV: 91.6 fL (ref 78.0–100.0)
Platelets: 172 10*3/uL (ref 150–400)
RBC: 4.66 MIL/uL (ref 4.22–5.81)
RDW: 15.5 % (ref 11.5–15.5)
WBC: 10.3 10*3/uL (ref 4.0–10.5)

## 2017-08-27 LAB — BASIC METABOLIC PANEL
Anion gap: 10 (ref 5–15)
BUN: 26 mg/dL — AB (ref 6–20)
CHLORIDE: 107 mmol/L (ref 101–111)
CO2: 22 mmol/L (ref 22–32)
CREATININE: 1.33 mg/dL — AB (ref 0.61–1.24)
Calcium: 8.9 mg/dL (ref 8.9–10.3)
GFR calc Af Amer: 57 mL/min — ABNORMAL LOW (ref >60)
GFR calc non Af Amer: 50 mL/min — ABNORMAL LOW (ref >60)
GLUCOSE: 113 mg/dL — AB (ref 65–99)
POTASSIUM: 4.3 mmol/L (ref 3.5–5.1)
Sodium: 139 mmol/L (ref 135–145)

## 2017-08-27 LAB — GLUCOSE, CAPILLARY
GLUCOSE-CAPILLARY: 116 mg/dL — AB (ref 65–99)
GLUCOSE-CAPILLARY: 108 mg/dL — AB (ref 65–99)
GLUCOSE-CAPILLARY: 118 mg/dL — AB (ref 65–99)
Glucose-Capillary: 115 mg/dL — ABNORMAL HIGH (ref 65–99)

## 2017-08-27 MED ORDER — QUETIAPINE FUMARATE 25 MG PO TABS
25.0000 mg | ORAL_TABLET | Freq: Every day | ORAL | Status: DC
  Administered 2017-08-27 – 2017-08-30 (×4): 25 mg via ORAL
  Filled 2017-08-27 (×4): qty 1

## 2017-08-27 MED ORDER — PANTOPRAZOLE SODIUM 40 MG PO TBEC
40.0000 mg | DELAYED_RELEASE_TABLET | Freq: Every day | ORAL | Status: DC
  Administered 2017-08-27 – 2017-08-31 (×5): 40 mg via ORAL
  Filled 2017-08-27 (×5): qty 1

## 2017-08-27 MED ORDER — SODIUM CHLORIDE 0.9 % IV SOLN
INTRAVENOUS | Status: DC
  Administered 2017-08-27: 12:00:00 via INTRAVENOUS

## 2017-08-27 NOTE — Progress Notes (Signed)
Late entry for 08/27/17 @ 2220: Patient arrived to floor via stretcher, accompanied by his son. Cardiac monitor applied, [address], called central monitoring and had 2nd verifier. Patient sleeping, unable to do NIH, falling asleep between questions. Has a condom catheter on. IV in R upper arm wrapped and saline locked. Bed alarm is on. Safety maintained.

## 2017-08-27 NOTE — Progress Notes (Addendum)
Physical Therapy Treatment Patient Details Name: Eugene [name] MRN: [id number] DOB: May 08, 1941 Today's Date: 08/27/2017    History of Present Illness pt is a 77 y/o male with pmh signifiance for HTN, afib admitted through [facility] ED with decreased sensation right side along with significant dysarthria.  CT showed a left thalamic bleed.  Per chart there is a component of difficulty with concentration, naming, comprehension and memory.  MRI showed the hemorrhage is stable with mild edema and regional shift.    PT Comments    Pt lethargic on arrival but able to arouse and participate with mobility once movement initiated. Pt with flat affect, not demonstrating impulsivity today but continues to lack awareness of deficits. Pt with progression of gait today and continues to need 2 person assist for safety and assist with balance and advancing RLE. Will continue to follow to progress and maximize safety, balance and function.   HR 73-98 99% SpO2 on RA BP 123/83 pre 126/74 post    Follow Up Recommendations  CIR     Equipment Recommendations  Rolling walker with 5" wheels    Recommendations for Other Services       Precautions / Restrictions Precautions Precautions: Fall    Mobility  Bed Mobility Overal bed mobility: Needs Assistance Bed Mobility: Supine to Sit     Supine to sit: Min assist     General bed mobility comments: pt diagonal in bed with RLE off bed on arrival, min assist to complete transition of legs off of bed and elevate trunk with cues for sequence  Transfers Overall transfer level: Needs assistance   Transfers: Sit to/from Stand Sit to Stand: Mod assist;+2 safety/equipment         General transfer comment: cues for hand placement and sequence with assist to rise as well as assist to position in midde of chair and control descent  Ambulation/Gait Ambulation/Gait assistance: Mod assist;+2 safety/equipment Ambulation Distance (Feet): 40  Feet Assistive device: 2 person hand held assist Gait Pattern/deviations: Step-to pattern;Decreased stride length;Wide base of support   Gait velocity interpretation: <1.31 ft/sec, indicative of household ambulator General Gait Details: with bil UE supported with therapist assist pt able to initate gait with wide BOS and RLE lagging behind LLE. Cues for safety, posture and sequence with assist x 4 to weight shift left and advance RLE. Mod cues for task and progression as well as anterior translation. +2 for safety, lines and assist   Stairs             Wheelchair Mobility    Modified Rankin (Stroke Patients Only) Modified Rankin (Stroke Patients Only) Pre-Morbid Rankin Score: No symptoms Modified Rankin: Moderately severe disability     Balance Overall balance assessment: Needs assistance   Sitting balance-Leahy Scale: Poor       Standing balance-Leahy Scale: Poor                              Cognition Arousal/Alertness: Awake/alert Behavior During Therapy: Flat affect Overall Cognitive Status: Impaired/Different from baseline Area of Impairment: Attention;Memory;Following commands;Safety/judgement;Awareness;Problem solving;Orientation                 Orientation Level: Disoriented to;Time;Situation;Place Current Attention Level: Focused Memory: Decreased short-term memory Following Commands: Follows one step commands inconsistently Safety/Judgement: Decreased awareness of safety;Decreased awareness of deficits Awareness: Intellectual Problem Solving: Slow processing;Decreased initiation;Difficulty sequencing;Requires verbal cues;Requires tactile cues General Comments: pt initially with difficulty maintaining eyes open but with  mobility able to maintain focused-sustained attention with frequent need for cues to attend to task with assist for initiation. Pt not demonstraing impulsivity with transfer today but lacks awareness of deficits       Exercises General Exercises - Lower Extremity Long Arc Quad: AROM;10 reps;Seated;Both    General Comments        Pertinent Vitals/Pain Pain Assessment: No/denies pain    Home Living                      Prior Function            PT Goals (current goals can now be found in the care plan section) Progress towards PT goals: Progressing toward goals    Frequency    Min 3X/week      PT Plan Current plan remains appropriate    Co-evaluation              AM-PAC PT "6 Clicks" Daily Activity  Outcome Measure  Difficulty turning over in bed (including adjusting bedclothes, sheets and blankets)?: Unable Difficulty moving from lying on back to sitting on the side of the bed? : Unable Difficulty sitting down on and standing up from a chair with arms (e.g., wheelchair, bedside commode, etc,.)?: Unable Help needed moving to and from a bed to chair (including a wheelchair)?: A Lot Help needed walking in hospital room?: A Lot Help needed climbing 3-5 steps with a railing? 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

## 2017-08-27 NOTE — Progress Notes (Signed)
Rehab admissions - I stopped by patient's room, but patient was asleep.  No family present when I rounded.  I left rehab booklets in the room.  I will call [facility] insurance and begin precert process anticipating patient possibly ready for CIR Monday.  I will follow up on Monday.  Call me for questions.  #[phone]

## 2017-08-27 NOTE — Progress Notes (Signed)
Attempted to call report x 1  

## 2017-08-27 NOTE — Progress Notes (Signed)
Report called to the receiving nurse.

## 2017-08-27 NOTE — Progress Notes (Signed)
STROKE TEAM PROGRESS NOTE   SUBJECTIVE (INTERVAL HISTORY) His son at bedside.  He is sleepy and drowsy sitting in chair.  As per son and RN, patient had agitation and combative overnight, was given Haldol treatment.  Did not get good sleep last night.  BP was high, put back on Cleviprex, just off Cleviprex this morning.  So far BP stable.  Repeat CT showed stable hematoma.   OBJECTIVE Temp:  [98.1 F (36.7 C)-98.6 F (37 C)] 98.1 F (36.7 C) (04/12 1600) Pulse Rate:  [43-152] 84 (04/12 1800) Cardiac Rhythm: Atrial fibrillation (04/12 0800) Resp:  [11-33] 21 (04/12 1800) BP: (90-178)/(56-122) 136/91 (04/12 1800) SpO2:  [88 %-100 %] 97 % (04/12 1800)  Recent Labs  Lab 08/26/17 1707 08/26/17 2144 08/27/17 0756 08/27/17 1154 08/27/17 1702  GLUCAP 130* 138* 115* 116* 108*   Recent Labs  Lab 08/25/17 1034 08/25/17 1038 08/27/17 0500  NA 142 144 139  K 4.4 4.3 4.3  CL 107 107 107  CO2 25  --  22  GLUCOSE 141* 137* 113*  BUN 27* 25* 26*  CREATININE 1.18 1.10 1.33*  CALCIUM 9.1  --  8.9   Recent Labs  Lab 08/25/17 1034  AST 15  ALT 14*  ALKPHOS 72  BILITOT 0.9  PROT 7.1  ALBUMIN 3.9   Recent Labs  Lab 08/25/17 1034 08/25/17 1038 08/27/17 0500  WBC 7.4  --  10.3  NEUTROABS 4.3  --   --   HGB 15.1 16.7 14.1  HCT 48.0 49.0 42.7  MCV 91.8  --  91.6  PLT 183  --  172   No results for input(s): CKTOTAL, CKMB, CKMBINDEX, TROPONINI in the last 168 hours. Recent Labs    08/25/17 1034 08/26/17 0120 08/26/17 0715 08/27/17 0500  LABPROT 14.5 13.0 13.6 13.8  INR 1.14 0.99 1.05 1.07   No results for input(s): COLORURINE, LABSPEC, PHURINE, GLUCOSEU, HGBUR, BILIRUBINUR, KETONESUR, PROTEINUR, UROBILINOGEN, NITRITE, LEUKOCYTESUR in the last 72 hours.  Invalid input(s): APPERANCEUR     Component Value Date/Time   CHOL 162 08/25/2017 1505   TRIG 112 08/25/2017 1505   HDL 39 (L) 08/25/2017 1505   CHOLHDL 4.2 08/25/2017 1505   VLDL 22 08/25/2017 1505   LDLCALC 101  (H) 08/25/2017 1505   Lab Results  Component Value Date   HGBA1C 6.4 (H) 08/25/2017      Component Value Date/Time   LABOPIA NONE DETECTED 08/26/2017 0815   COCAINSCRNUR NONE DETECTED 08/26/2017 0815   LABBENZ NONE DETECTED 08/26/2017 0815   AMPHETMU NONE DETECTED 08/26/2017 0815   THCU NONE DETECTED 08/26/2017 0815   LABBARB NONE DETECTED 08/26/2017 0815    Recent Labs  Lab 08/25/17 1034  ETH <10    I have personally reviewed the radiological images below and agree with the radiology interpretations.  Ct Angio Head W Or Wo Contrast  Result Date: 08/25/2017 CLINICAL DATA:  Left flank hemorrhage. Basilar artery stenosis or occlusion. EXAM: CT ANGIOGRAPHY HEAD AND NECK TECHNIQUE: Multidetector CT imaging of the head and neck was performed using the standard protocol during bolus administration of intravenous contrast. Multiplanar CT image reconstructions and MIPs were obtained to evaluate the vascular anatomy. Carotid stenosis measurements (when applicable) are obtained utilizing NASCET criteria, using the distal internal carotid diameter as the denominator. CONTRAST:  95mL ISOVUE-370 IOPAMIDOL (ISOVUE-370) INJECTION 76% COMPARISON:  CT of the head and MRI and MRA brain from the same day. FINDINGS: Stepped CTA NECK FINDINGS Aortic arch: Extensive atherosclerotic calcifications are present at  the aorta and branch vessel origins without significant stenosis or aneurysm. Right carotid system: Scattered mural calcifications are present along the right common carotid artery. Dense calcifications are present at the right carotid bifurcation. Minimal luminal diameter is 1.5 mm this compares with a more normal distal lumen of 5.8 mm. The cervical right ICA is within normal limits of this level. Left carotid system: Atherosclerotic calcifications are present along the wall the left common carotid artery without significant stenosis. Dense calcifications are present at the left carotid bifurcation  without a significant stenosis relative to the more distal vessel. Vertebral arteries: Vertebral arteries vertebral arteries originate from the subclavian arteries bilaterally. There is dense calcification at the origin of the right vertebral artery. The vertebral arteries are codominant. There are high-grade near occlusive stenoses of both vertebral arteries at the dural margin. No other stenoses are present in the neck. Skeleton: Endplate degenerative changes are most pronounced at C3-4 C5-6 and C6-7. Extensive dental disease is present. No focal lytic or blastic lesions are present. Other neck: No significant mucosal or submucosal lesions are present. The palatine tonsils are enlarged bilaterally. No discrete mass lesion is present. Vocal cords are midline and symmetric. There is no significant adenopathy. Salivary glands are within normal limits. Multiple left-sided thyroid nodules are less than 10 mm. No dominant nodule is present. Upper chest: Lung apices are clear. No focal nodule, mass, or airspace disease present. The thoracic inlet is within normal limits. Review of the MIP images confirms the above findings CTA HEAD FINDINGS Anterior circulation: Atherosclerotic calcifications are present within the cavernous internal carotid arteries bilaterally without a significant stenosis relative to the more distal vessel through the ICA termini. The A1 and M1 segments are within normal limits. There is a proximal bifurcation of the left M1 segment, a normal variant moderate narrowing is present in the distal right M1 segment. MCA branch vessels are intact. Anterior communicating artery is patent. Moderate proximal left A2 segment stenosis is present. Distal small vessel disease is present throughout. Posterior circulation: The right AICA is dominant. A smaller left AICA is present. Neither PICA vessel is evident. There is a fenestration of the vertebrobasilar junction. There is some flow in both vertebral arteries.  Stenosis is worse on the left. The basilar artery is occluded just distal to the vertebrobasilar junction at the level of the AICA vessels. A fetal type left posterior cerebral artery is present. The right posterior cerebral artery and basilar tip is reconstituted by a right posterior communicating artery. The basilar tip is reconstituted to the level of the superior cerebellar arteries. The left superior cerebellar artery is occluded or highly stenotic. Proximal PCA branch vessels are intact bilaterally. Distal PCA disease is present bilaterally. Venous sinuses: The dural sinuses are patent. Straight sinus and deep cerebral veins are intact. Cortical veins are unremarkable. Anatomic variants: Fetal type left posterior cerebral artery. Delayed phase: Postcontrast images again demonstrate the left thalamic hemorrhage. There is no enhancement. Calcification and remote lacunar infarct of the left cerebellum are stable. Review of the MIP images confirms the above findings IMPRESSION: 1. 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 2. Reconstitution of the basilar tip to the level of the superior cerebellar arteries via a right posterior communicating artery. 3. 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 4. High-grade stenosis of the vertebral arteries at the dural margin and beyond, left greater than right. 5. 70% stenosis of the right carotid bifurcation. 6. Dense calcifications in the  distal left common carotid artery and carotid bifurcation without significant stenosis relative to the more distal vessel. 7. Mild distal right M1 segment stenosis. 8. Moderate proximal left A2 segment stenosis. Electronically Signed   By: San Morelle M.D.   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 EXAM: CT HEAD WITHOUT CONTRAST TECHNIQUE: Contiguous axial images were obtained from the base of the skull through the vertex without intravenous contrast. COMPARISON:  CTA head and neck, brain MRI/MRA, and head CT without contrast 08/25/2017. FINDINGS: Brain: Hyperdense hemorrhage centered in the left thalamus has not significantly changed. Dimensions today are 19 x 20 x 26 millimeters (AP by transverse by CC), for an estimated blood volume of 5 milliliters. No intraventricular or extra-axial extension. Surrounding edema has mildly progressed since 08/25/2017, but regional mass effect is mild and stable. Elsewhere Stable gray-white matter differentiation throughout the brain. No new intracranial hemorrhage. No new intracranial mass effect. No ventriculomegaly. Vascular: Extensive calcified atherosclerosis. Skull: No acute osseous abnormality identified. Sinuses/Orbits: Increased mucosal thickening in the right maxillary sinus but otherwise stable. Other: Stable orbit and scalp soft tissues. IMPRESSION: 1. Stable to slightly smaller left thalamic intra-axial hemorrhage since the presentation CT on 08/25/2017. Mildly increased surrounding edema, but mild regional mass effect is stable. 2. No new intracranial abnormality. Underlying advanced chronic small vessel disease. Electronically Signed   By: Genevie Ann M.D.   On: 08/27/2017 07:29   Ct Angio Neck W Or Wo Contrast  Result Date: 08/25/2017 CLINICAL DATA:  Left flank hemorrhage. Basilar artery stenosis or occlusion. EXAM: CT ANGIOGRAPHY HEAD AND NECK TECHNIQUE: Multidetector CT imaging of the head and neck was performed using the standard protocol during bolus administration of intravenous contrast. Multiplanar CT image reconstructions and MIPs were obtained to evaluate the vascular anatomy. Carotid stenosis measurements (when applicable) are obtained utilizing NASCET criteria, using the distal internal carotid diameter as the  denominator. CONTRAST:  52mL ISOVUE-370 IOPAMIDOL (ISOVUE-370) INJECTION 76% COMPARISON:  CT of the head and MRI and MRA brain from the same day. FINDINGS: Stepped CTA NECK FINDINGS Aortic arch: Extensive atherosclerotic calcifications are present at the aorta and branch vessel origins without significant stenosis or aneurysm. Right carotid system: Scattered mural calcifications are present along the right common carotid artery. Dense calcifications are present at the right carotid bifurcation. Minimal luminal diameter is 1.5 mm this compares with a more normal distal lumen of 5.8 mm. The cervical right ICA is within normal limits of this level. Left carotid system: Atherosclerotic calcifications are present along the wall the left common carotid artery without significant stenosis. Dense calcifications are present at the left carotid bifurcation without a significant stenosis relative to the more distal vessel. Vertebral arteries: Vertebral arteries vertebral arteries originate from the subclavian arteries bilaterally. There is dense calcification at the origin of the right vertebral artery. The vertebral arteries are codominant. There are high-grade near occlusive stenoses of both vertebral arteries at the dural margin. No other stenoses are present in the neck. Skeleton: Endplate degenerative changes are most pronounced at C3-4 C5-6 and C6-7. Extensive dental disease is present. No focal lytic or blastic lesions are present. Other neck: No significant mucosal or submucosal lesions are present. The palatine tonsils are enlarged bilaterally. No discrete mass lesion is present. Vocal cords are midline and symmetric. There is no significant adenopathy. Salivary  glands are within normal limits. Multiple left-sided thyroid nodules are less than 10 mm. No dominant nodule is present. Upper chest: Lung apices are clear. No focal nodule, mass, or airspace disease present. The thoracic inlet is within normal limits. Review  of the MIP images confirms the above findings CTA HEAD FINDINGS Anterior circulation: Atherosclerotic calcifications are present within the cavernous internal carotid arteries bilaterally without a significant stenosis relative to the more distal vessel through the ICA termini. The A1 and M1 segments are within normal limits. There is a proximal bifurcation of the left M1 segment, a normal variant moderate narrowing is present in the distal right M1 segment. MCA branch vessels are intact. Anterior communicating artery is patent. Moderate proximal left A2 segment stenosis is present. Distal small vessel disease is present throughout. Posterior circulation: The right AICA is dominant. A smaller left AICA is present. Neither PICA vessel is evident. There is a fenestration of the vertebrobasilar junction. There is some flow in both vertebral arteries. Stenosis is worse on the left. The basilar artery is occluded just distal to the vertebrobasilar junction at the level of the AICA vessels. A fetal type left posterior cerebral artery is present. The right posterior cerebral artery and basilar tip is reconstituted by a right posterior communicating artery. The basilar tip is reconstituted to the level of the superior cerebellar arteries. The left superior cerebellar artery is occluded or highly stenotic. Proximal PCA branch vessels are intact bilaterally. Distal PCA disease is present bilaterally. Venous sinuses: The dural sinuses are patent. Straight sinus and deep cerebral veins are intact. Cortical veins are unremarkable. Anatomic variants: Fetal type left posterior cerebral artery. Delayed phase: Postcontrast images again demonstrate the left thalamic hemorrhage. There is no enhancement. Calcification and remote lacunar infarct of the left cerebellum are stable. Review of the MIP images confirms the above findings IMPRESSION: 1. 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 2. Reconstitution  of the basilar tip to the level of the superior cerebellar arteries via a right posterior communicating artery. 3. 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 4. High-grade stenosis of the vertebral arteries at the dural margin and beyond, left greater than right. 5. 70% stenosis of the right carotid bifurcation. 6. Dense calcifications in the distal left common carotid artery and carotid bifurcation without significant stenosis relative to the more distal vessel. 7. Mild distal right M1 segment stenosis. 8. Moderate proximal left A2 segment stenosis. Electronically Signed   By: San Morelle M.D.   On: 08/25/2017 17:19   Mr Jodene Nam Head Wo Contrast  Addendum Date: 08/25/2017   ADDENDUM REPORT: 08/25/2017 15:12 ADDENDUM: This study was discussed by telephone with Dr. Kerney Elbe on 08/25/2017 at 15:12 . Electronically Signed   By: Genevie Ann M.D.   On: 08/25/2017 15:12   Result Date: 08/25/2017 CLINICAL DATA:  77 year old male with code stroke presentation and acute left thalamic hemorrhage on head CT earlier today. EXAM: MRI HEAD WITHOUT AND WITH CONTRAST MRA HEAD WITHOUT CONTRAST TECHNIQUE: Multiplanar, multiecho pulse sequences of the brain and surrounding structures were obtained without and with intravenous contrast. Angiographic images of the head were obtained using MRA technique without contrast. CONTRAST:  33mL MULTIHANCE GADOBENATE DIMEGLUMINE 529 MG/ML IV SOLN COMPARISON:  Head CT without contrast 1035 hours. FINDINGS: MRI HEAD FINDINGS Brain: Oval mixed hyper- and hypointense T2 signal and iso- to hypointense T1 signal hemorrhage re-demonstrated in the left thalamus encompassing 20 x 21 x 26 millimeters (AP by transverse  by CC) for an estimated blood volume of 5-6 mL. Surrounding edema with mild regional mass effect. No intraventricular extension identified. No extra-axial extension identified. Associated diffusion susceptibility artifact. No convincing surrounding area  of restricted diffusion. There are punctate areas of enhancement within the hemorrhage (series 12, images 26 and 27), but this represents delayed phase enhancement and not arterial phase spot sign. No restricted diffusion elsewhere. There are several superimposed chronic microhemorrhages elsewhere in the left hemisphere. Chronic hemosiderin and/or dystrophic calcification noted in the left cerebellum. 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 No cortical encephalomalacia. No other abnormal intracranial enhancement. No dural thickening. No midline shift, ventriculomegaly, extra-axial collection. Cervicomedullary junction and pituitary are within normal limits. Vascular: The major anterior circulation flow voids are preserved. There is intermittent loss of the posterior circulation flow voids, see MRA findings below. The major dural venous sinuses are enhancing and appear to be patent. Skull and upper cervical spine: Negative visible cervical spine. Normal bone marrow signal. Sinuses/Orbits: Postoperative changes to both globes. Moderate ethmoid sinus mucosal thickening. Other: Visible internal auditory structures appear normal. Negative scalp and face soft tissues. MRA HEAD FINDINGS Antegrade flow in the distal vertebral arteries. Vertebral artery irregularity which may be exacerbated by motion artifact. 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 The distal 3rd of the basilar appears most occluded, although the basilar tip is reconstituted by the right posterior communicating artery. There is a fetal type left PCA origin. The SCA origins are patent. The bilateral PCA branches are patent without stenosis. There is no flow signal identified within the left thalamic hemorrhage. Antegrade flow in  both ICA siphons which are mildly dolichoectatic. No siphon stenosis. Ophthalmic and right posterior communicating artery origins are normal. Patent carotid termini. Normal MCA and ACA origins. The anterior communicating artery and visible ACA branches are within normal limits. The left MCA M1 segment bifurcates early. The left MCA branches appear within normal limits, no stenosis. There is mild to moderate irregularity and stenosis of the right MCA M1 segment. Prominent right until a striate branch arises from this area. The right MCA bifurcation remains patent. The right MCA branches are patent with up to mild irregularity. IMPRESSION: 1. 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 2. Left thalamic hemorrhage is stable from the CT earlier today with no ventricular or extra-axial extension. Mild surrounding edema and regional mass effect. 3. 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 4. Anterior circulation atherosclerosis primarily at the right MCA M1 segment where there is up to moderate stenosis. Electronically Signed: By: Genevie Ann M.D. On: 08/25/2017 15:08   Mr Jeri Cos XM Contrast  Addendum Date: 08/25/2017   ADDENDUM REPORT: 08/25/2017 15:12 ADDENDUM: This study was discussed by telephone with Dr. Kerney Elbe on 08/25/2017 at 15:12 . Electronically Signed   By: Genevie Ann M.D.   On: 08/25/2017 15:12   Result Date: 08/25/2017 CLINICAL DATA:  77 year old male with code stroke presentation and acute left thalamic hemorrhage on head CT earlier today. EXAM: MRI HEAD WITHOUT AND WITH CONTRAST MRA HEAD WITHOUT CONTRAST TECHNIQUE: Multiplanar, multiecho pulse sequences of the brain and surrounding  structures were obtained without and with intravenous contrast.  Angiographic images of the head were obtained using MRA technique without contrast. CONTRAST:  44mL MULTIHANCE GADOBENATE DIMEGLUMINE 529 MG/ML IV SOLN COMPARISON:  Head CT without contrast 1035 hours. FINDINGS: MRI HEAD FINDINGS Brain: Oval mixed hyper- and hypointense T2 signal and iso- to hypointense T1 signal hemorrhage re-demonstrated in the left thalamus encompassing 20 x 21 x 26 millimeters (AP by transverse by CC) for an estimated blood volume of 5-6 mL. Surrounding edema with mild regional mass effect. No intraventricular extension identified. No extra-axial extension identified. Associated diffusion susceptibility artifact. No convincing surrounding area of restricted diffusion. There are punctate areas of enhancement within the hemorrhage (series 12, images 26 and 27), but this represents delayed phase enhancement and not arterial phase spot sign. No restricted diffusion elsewhere. There are several superimposed chronic microhemorrhages elsewhere in the left hemisphere. Chronic hemosiderin and/or dystrophic calcification noted in the left cerebellum. 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 No cortical encephalomalacia. No other abnormal intracranial enhancement. No dural thickening. No midline shift, ventriculomegaly, extra-axial collection. Cervicomedullary junction and pituitary are within normal limits. Vascular: The major anterior circulation flow voids are preserved. There is intermittent loss of the posterior circulation flow voids, see MRA findings below. The major dural venous sinuses are enhancing and appear to be patent. Skull and upper cervical spine: Negative visible cervical spine. Normal bone marrow signal. Sinuses/Orbits: Postoperative changes to both globes. Moderate ethmoid sinus mucosal thickening. Other: Visible internal auditory structures appear normal. Negative scalp and face soft  tissues. MRA HEAD FINDINGS Antegrade flow in the distal vertebral arteries. Vertebral artery irregularity which may be exacerbated by motion artifact. 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 The distal 3rd of the basilar appears most occluded, although the basilar tip is reconstituted by the right posterior communicating artery. There is a fetal type left PCA origin. The SCA origins are patent. The bilateral PCA branches are patent without stenosis. There is no flow signal identified within the left thalamic hemorrhage. Antegrade flow in both ICA siphons which are mildly dolichoectatic. No siphon stenosis. Ophthalmic and right posterior communicating artery origins are normal. Patent carotid termini. Normal MCA and ACA origins. The anterior communicating artery and visible ACA branches are within normal limits. The left MCA M1 segment bifurcates early. The left MCA branches appear within normal limits, no stenosis. There is mild to moderate irregularity and stenosis of the right MCA M1 segment. Prominent right until a striate branch arises from this area. The right MCA bifurcation remains patent. The right MCA branches are patent with up to mild irregularity. IMPRESSION: 1. 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 2. Left thalamic hemorrhage is stable from the CT earlier today with no ventricular or extra-axial extension. Mild surrounding edema and regional mass effect. 3. 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 4. Anterior circulation atherosclerosis primarily at the right MCA M1 segment where there is up to moderate stenosis.  Electronically Signed: By: Genevie Ann M.D. On: 08/25/2017 15:08   Dg Chest Port 1 View  Result Date: 08/26/2017 CLINICAL DATA:  Desaturation with shortness of breath  EXAM: PORTABLE CHEST 1 VIEW COMPARISON:  08/25/2017, 06/24/2015 FINDINGS: Linear atelectasis at the left lung base. Borderline cardiomegaly with aortic atherosclerosis. No pleural effusion or pneumothorax. IMPRESSION: 1. Linear atelectasis or scarring at the left base 2. Borderline to mild cardiomegaly Electronically Signed   By: Donavan Foil M.D.   On: 08/26/2017 20:17   Dg Chest Portable 1 View  Result Date: 08/25/2017 CLINICAL DATA:  Code stroke. History of atrial fibrillation, coronary artery disease with stent placement, previous MI, current smoker. EXAM: PORTABLE CHEST 1 VIEW COMPARISON:  Chest x-ray of June 24, 2015 FINDINGS: The lungs are well-expanded. There is no focal infiltrate. The interstitial markings are coarse though stable. The heart is top-normal in size. The pulmonary vascularity is normal. There is calcification in the wall of the aortic arch. The bony thorax exhibits no acute abnormality. IMPRESSION: Mild chronic bronchitic changes, stable. No pneumonia, CHF, nor other acute cardiopulmonary abnormality. Thoracic aortic atherosclerosis. Electronically Signed   By: David  Martinique M.D.   On: 08/25/2017 13:33   Ct Head Code Stroke Wo Contrast  Result Date: 08/25/2017 CLINICAL DATA:  Code stroke. 77 year old male with right side weakness since 0930 hours. EXAM: CT HEAD WITHOUT CONTRAST TECHNIQUE: Contiguous axial images were obtained from the base of the skull through the vertex without intravenous contrast. COMPARISON:  None. FINDINGS: Brain: Oval hyperdense hemorrhage occupying the left thalamus encompasses 20 x 26 x 29 millimeters (AP by transverse by CC) for an estimated intra-axial blood volume of 8 milliliters. There is mild surrounding edema and regional mass effect. No intraventricular or extra-axial extension.  Elsewhere there are small dystrophic calcifications in the basal ganglia, and a coarse dystrophic calcification in the left cerebellar hemisphere which appears to be associated with a small chronic left cerebellar infarct. No other intracranial hemorrhage identified. Confluent and widespread bilateral cerebral white matter hypodensity. Bilateral deep gray matter heterogeneity is associated. No cerebral cortical encephalomalacia is identified. No midline shift, ventriculomegaly, mass effect, or acute cortically based infarct. Vascular: Extensive intracranial calcified atherosclerosis with prominent involvement of the right MCA. No suspicious intracranial vascular hyperdensity. Skull: Negative. Sinuses/Orbits: Moderate ethmoid sinus mucosal thickening. Mild frontal sinus mucosal thickening. Other visible paranasal sinuses and mastoids are well pneumatized. Other: Postoperative changes to both globes. Otherwise negative orbits soft tissues. Visualized scalp soft tissues are within normal limits. ASPECTS [facility] Stroke Program Early CT Score) Not applicable, acute hemorrhage. IMPRESSION: 1. Oval left thalamic hemorrhage with an estimated blood volume of 8 mL. Mild surrounding edema and regional mass effect. No intraventricular or extra-axial extension of blood. 2. Underlying advanced chronic small vessel disease, indicating likely small vessel etiology of #1. 3. 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 4. Extensive intracranial calcified atherosclerosis. Critical Value/emergent results were called by telephone at the time of interpretation on 08/25/2017 at 1052 hours to Dr. Francine Graven , who verbally acknowledged these results. Electronically Signed   By: Genevie Ann M.D.   On: 08/25/2017 10:52   TTE - Left ventricle: The cavity size was normal. Wall thickness was   increased in a pattern of moderate LVH. Systolic function was   normal. The estimated ejection fraction was in the range of  55%   to 60%. Wall motion was normal; there were no regional wall   motion abnormalities. - Aortic valve: Trileaflet; normal thickness, mildly calcified   leaflets. - Aortic root: The aortic root was mildly dilated. - Mitral valve: Mildly calcified annulus. There was mild   regurgitation. - Left atrium: The  atrium was moderately dilated. - Right atrium: The atrium was moderately dilated. Impressions: - No cardiac source of emboli was indentified.   PHYSICAL EXAM  Temp:  [98.1 F (36.7 C)-98.6 F (37 C)] 98.1 F (36.7 C) (04/12 1600) Pulse Rate:  [43-152] 84 (04/12 1800) Resp:  [11-33] 21 (04/12 1800) BP: (90-178)/(56-122) 136/91 (04/12 1800) SpO2:  [88 %-100 %] 97 % (04/12 1800)  General - Well nourished, well developed, in no apparent distress.  Ophthalmologic - fundi not visualized due to noncooperation.  Cardiovascular - irregularly irregular heart rate and rhythm.  Neuro - drowsy sleepy, not cooperating on language testing, but able to answer orientation questions.  Oriented to self, age and people but not to time.  Visual field test not cooperative.  Mild right nasolabial fold flattening.  Tongue midline.  Pronator drift on the right upper extremity, right lower extremity DF 4+/5.  Otherwise normal strength.  DTRs 1+, no Babinski.  Sensation symmetrical.  Coordination not cooperative.  Gait not tested   ASSESSMENT/PLAN Mr. DICKIE CLOE is a 77 y.o. 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 No tPA given due to [place].    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   Repeat CT hematoma stable  2D Echo EF 55-60%  LDL 101  HgbA1c 6.4  SCDs for VTE prophylaxis Fall precautions  Diet heart healthy/carb modified Room service appropriate? Yes; Fluid consistency: Thin   clopidogrel 75 mg daily and Xarelto (rivaroxaban) daily prior to admission, now on No antithrombotic due to [place]. Will consider ASA 81mg  5-7 days if neuro stable post ICH, and repeat CT in 2-3 weeks, if ICH resolves, add eliquis to ASA 81mg .   Patient counseled to be compliant with his antithrombotic medications  Ongoing aggressive stroke risk factor management  Therapy recommendations:  pending  Disposition:  Pending  Afib on Xarelto  Tele showed persistent AFib  Rate controlled  Hold off AC for now.   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 Avoid hypotension due to BA occlusion, avoid hypertension due to anticoagulation use.  Need BP check closely at home  Hyperlipidemia  Home meds:  crestor 20   LDL 101, goal < 70  Now on crestor 20  Continue statin at discharge  Sundowning  May have underlying cognitive impairment  Was given Haldol overnight  Put on Seroquel 25 nightly  Close monitoring  Other Stroke Risk Factors  Hx stroke/TIA - imaging showed old left cerebellar infarct with calcification  Obstructive sleep apnea  Other Active Problems  Hospital day # 2  This patient is critically ill due to [place], afib, anticoagulation reversal, HTN, BA occlusion  and at significant risk of neurological worsening, death form stroke, hematoma expansion, bleeding, heart failure. This patient's care requires constant monitoring of vital signs, hemodynamics, respiratory and cardiac monitoring, review of multiple databases, neurological assessment, discussion with family, other specialists and medical decision making of high complexity. I spent 35 minutes of neurocritical care time in the care of this patient. I had long discussion with son at bedside, updated pt current condition, treatment plan and potential prognosis. He expressed understanding and appreciation.   Rosalin Hawking, MD PhD Stroke Neurology 08/27/2017 6:26 PM    To contact Stroke Continuity provider, please refer to [email or web address] After hours, contact General Neurology

## 2017-08-28 ENCOUNTER — Inpatient Hospital Stay (HOSPITAL_COMMUNITY): Payer: Medicare Other

## 2017-08-28 DIAGNOSIS — R0602 Shortness of breath: Secondary | ICD-10-CM

## 2017-08-28 DIAGNOSIS — R062 Wheezing: Secondary | ICD-10-CM

## 2017-08-28 LAB — BASIC METABOLIC PANEL
Anion gap: 10 (ref 5–15)
BUN: 28 mg/dL — AB (ref 6–20)
CHLORIDE: 109 mmol/L (ref 101–111)
CO2: 19 mmol/L — ABNORMAL LOW (ref 22–32)
Calcium: 8.7 mg/dL — ABNORMAL LOW (ref 8.9–10.3)
Creatinine, Ser: 1.02 mg/dL (ref 0.61–1.24)
Glucose, Bld: 103 mg/dL — ABNORMAL HIGH (ref 65–99)
POTASSIUM: 4 mmol/L (ref 3.5–5.1)
SODIUM: 138 mmol/L (ref 135–145)

## 2017-08-28 LAB — CBC
HCT: 44.2 % (ref 39.0–52.0)
HEMOGLOBIN: 14.4 g/dL (ref 13.0–17.0)
MCH: 29.4 pg (ref 26.0–34.0)
MCHC: 32.6 g/dL (ref 30.0–36.0)
MCV: 90.2 fL (ref 78.0–100.0)
PLATELETS: 165 10*3/uL (ref 150–400)
RBC: 4.9 MIL/uL (ref 4.22–5.81)
RDW: 15 % (ref 11.5–15.5)
WBC: 11.7 10*3/uL — AB (ref 4.0–10.5)

## 2017-08-28 LAB — GLUCOSE, CAPILLARY
GLUCOSE-CAPILLARY: 98 mg/dL (ref 65–99)
GLUCOSE-CAPILLARY: 109 mg/dL — AB (ref 65–99)
GLUCOSE-CAPILLARY: 103 mg/dL — AB (ref 65–99)
Glucose-Capillary: 100 mg/dL — ABNORMAL HIGH (ref 65–99)

## 2017-08-28 LAB — PROTIME-INR
INR: 1.1
PROTHROMBIN TIME: 14.1 s (ref 11.4–15.2)

## 2017-08-28 MED ORDER — FUROSEMIDE 10 MG/ML IJ SOLN
40.0000 mg | Freq: Once | INTRAMUSCULAR | Status: AC
  Administered 2017-08-28: 40 mg via INTRAVENOUS
  Filled 2017-08-28: qty 4

## 2017-08-28 MED ORDER — BISACODYL 10 MG RE SUPP
10.0000 mg | Freq: Once | RECTAL | Status: AC
  Administered 2017-08-28: 10 mg via RECTAL
  Filled 2017-08-28: qty 1

## 2017-08-28 MED ORDER — IPRATROPIUM-ALBUTEROL 0.5-2.5 (3) MG/3ML IN SOLN
3.0000 mL | RESPIRATORY_TRACT | Status: DC | PRN
  Administered 2017-08-28: 3 mL via RESPIRATORY_TRACT
  Filled 2017-08-28: qty 3

## 2017-08-28 MED ORDER — HEPARIN SODIUM (PORCINE) 5000 UNIT/ML IJ SOLN
5000.0000 [IU] | Freq: Three times a day (TID) | INTRAMUSCULAR | Status: DC
Start: 2017-08-28 — End: 2017-08-31
  Administered 2017-08-28 – 2017-08-31 (×10): 5000 [IU] via SUBCUTANEOUS
  Filled 2017-08-28 (×10): qty 1

## 2017-08-28 NOTE — Progress Notes (Signed)
Central tele called earlier, stated the patient had a burst of SVT at 133 nonsustained. Patient sleeping as I checked on him. She just called and stated his HR went down to 35 nonsustained and came right up. B/P 155/111. Will administer hydralazine as ordered prn.and monitor closely.

## 2017-08-28 NOTE — Progress Notes (Signed)
STROKE TEAM PROGRESS NOTE   SUBJECTIVE (INTERVAL HISTORY) His son and daughter are at bedside.  He is sleepy and drowsy lying in bed but easily arousable. Reported to have good sleep last night, no agitation. This morning ate all his breakfast. Verbalized no complains. However, hours later, called by RN that pt had mild wheezing and mild SOB, O2 sat good. Will do CXR, EKG, duoneb and lasix.   OBJECTIVE Temp:  [98 F (36.7 C)-99.5 F (37.5 C)] 98.7 F (37.1 C) (04/13 0934) Pulse Rate:  [39-143] 72 (04/13 0934) Cardiac Rhythm: Atrial fibrillation (04/13 0727) Resp:  [11-21] 16 (04/13 0934) BP: (113-155)/(68-111) 121/82 (04/13 0934) SpO2:  [94 %-98 %] 98 % (04/13 0934) Weight:  [200 lb 13.4 oz (91.1 kg)] 200 lb 13.4 oz (91.1 kg) (04/12 2225)  Recent Labs  Lab 08/27/17 1154 08/27/17 1702 08/27/17 2141 08/28/17 0657 08/28/17 1244  GLUCAP 116* 108* 118* 100* 98   Recent Labs  Lab 08/25/17 1034 08/25/17 1038 08/27/17 0500 08/28/17 0716  NA 142 144 139 138  K 4.4 4.3 4.3 4.0  CL 107 107 107 109  CO2 25  --  22 19*  GLUCOSE 141* 137* 113* 103*  BUN 27* 25* 26* 28*  CREATININE 1.18 1.10 1.33* 1.02  CALCIUM 9.1  --  8.9 8.7*   Recent Labs  Lab 08/25/17 1034  AST 15  ALT 14*  ALKPHOS 72  BILITOT 0.9  PROT 7.1  ALBUMIN 3.9   Recent Labs  Lab 08/25/17 1034 08/25/17 1038 08/27/17 0500 08/28/17 0716  WBC 7.4  --  10.3 11.7*  NEUTROABS 4.3  --   --   --   HGB 15.1 16.7 14.1 14.4  HCT 48.0 49.0 42.7 44.2  MCV 91.8  --  91.6 90.2  PLT 183  --  172 165   No results for input(s): CKTOTAL, CKMB, CKMBINDEX, TROPONINI in the last 168 hours. Recent Labs    08/26/17 0120 08/26/17 0715 08/27/17 0500 08/28/17 0716  LABPROT 13.0 13.6 13.8 14.1  INR 0.99 1.05 1.07 1.10   No results for input(s): COLORURINE, LABSPEC, PHURINE, GLUCOSEU, HGBUR, BILIRUBINUR, KETONESUR, PROTEINUR, UROBILINOGEN, NITRITE, LEUKOCYTESUR in the last 72 hours.  Invalid input(s): APPERANCEUR      Component Value Date/Time   CHOL 162 08/25/2017 1505   TRIG 112 08/25/2017 1505   HDL 39 (L) 08/25/2017 1505   CHOLHDL 4.2 08/25/2017 1505   VLDL 22 08/25/2017 1505   LDLCALC 101 (H) 08/25/2017 1505   Lab Results  Component Value Date   HGBA1C 6.4 (H) 08/25/2017      Component Value Date/Time   LABOPIA NONE DETECTED 08/26/2017 0815   COCAINSCRNUR NONE DETECTED 08/26/2017 0815   LABBENZ NONE DETECTED 08/26/2017 0815   AMPHETMU NONE DETECTED 08/26/2017 0815   THCU NONE DETECTED 08/26/2017 0815   LABBARB NONE DETECTED 08/26/2017 0815    Recent Labs  Lab 08/25/17 1034  ETH <10    I have personally reviewed the radiological images below and agree with the radiology interpretations.  Ct Angio Head W Or Wo Contrast  Result Date: 08/25/2017 CLINICAL DATA:  Left flank hemorrhage. Basilar artery stenosis or occlusion. EXAM: CT ANGIOGRAPHY HEAD AND NECK TECHNIQUE: Multidetector CT imaging of the head and neck was performed using the standard protocol during bolus administration of intravenous contrast. Multiplanar CT image reconstructions and MIPs were obtained to evaluate the vascular anatomy. Carotid stenosis measurements (when applicable) are obtained utilizing NASCET criteria, using the distal internal carotid diameter as the denominator.  CONTRAST:  30mL ISOVUE-370 IOPAMIDOL (ISOVUE-370) INJECTION 76% COMPARISON:  CT of the head and MRI and MRA brain from the same day. FINDINGS: Stepped CTA NECK FINDINGS Aortic arch: Extensive atherosclerotic calcifications are present at the aorta and branch vessel origins without significant stenosis or aneurysm. Right carotid system: Scattered mural calcifications are present along the right common carotid artery. Dense calcifications are present at the right carotid bifurcation. Minimal luminal diameter is 1.5 mm this compares with a more normal distal lumen of 5.8 mm. The cervical right ICA is within normal limits of this level. Left carotid system:  Atherosclerotic calcifications are present along the wall the left common carotid artery without significant stenosis. Dense calcifications are present at the left carotid bifurcation without a significant stenosis relative to the more distal vessel. Vertebral arteries: Vertebral arteries vertebral arteries originate from the subclavian arteries bilaterally. There is dense calcification at the origin of the right vertebral artery. The vertebral arteries are codominant. There are high-grade near occlusive stenoses of both vertebral arteries at the dural margin. No other stenoses are present in the neck. Skeleton: Endplate degenerative changes are most pronounced at C3-4 C5-6 and C6-7. Extensive dental disease is present. No focal lytic or blastic lesions are present. Other neck: No significant mucosal or submucosal lesions are present. The palatine tonsils are enlarged bilaterally. No discrete mass lesion is present. Vocal cords are midline and symmetric. There is no significant adenopathy. Salivary glands are within normal limits. Multiple left-sided thyroid nodules are less than 10 mm. No dominant nodule is present. Upper chest: Lung apices are clear. No focal nodule, mass, or airspace disease present. The thoracic inlet is within normal limits. Review of the MIP images confirms the above findings CTA HEAD FINDINGS Anterior circulation: Atherosclerotic calcifications are present within the cavernous internal carotid arteries bilaterally without a significant stenosis relative to the more distal vessel through the ICA termini. The A1 and M1 segments are within normal limits. There is a proximal bifurcation of the left M1 segment, a normal variant moderate narrowing is present in the distal right M1 segment. MCA branch vessels are intact. Anterior communicating artery is patent. Moderate proximal left A2 segment stenosis is present. Distal small vessel disease is present throughout. Posterior circulation: The right  AICA is dominant. A smaller left AICA is present. Neither PICA vessel is evident. There is a fenestration of the vertebrobasilar junction. There is some flow in both vertebral arteries. Stenosis is worse on the left. The basilar artery is occluded just distal to the vertebrobasilar junction at the level of the AICA vessels. A fetal type left posterior cerebral artery is present. The right posterior cerebral artery and basilar tip is reconstituted by a right posterior communicating artery. The basilar tip is reconstituted to the level of the superior cerebellar arteries. The left superior cerebellar artery is occluded or highly stenotic. Proximal PCA branch vessels are intact bilaterally. Distal PCA disease is present bilaterally. Venous sinuses: The dural sinuses are patent. Straight sinus and deep cerebral veins are intact. Cortical veins are unremarkable. Anatomic variants: Fetal type left posterior cerebral artery. Delayed phase: Postcontrast images again demonstrate the left thalamic hemorrhage. There is no enhancement. Calcification and remote lacunar infarct of the left cerebellum are stable. Review of the MIP images confirms the above findings IMPRESSION: 1. 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 2. Reconstitution of the basilar tip to the level of the superior cerebellar arteries via a right posterior communicating artery. 3. 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 4. High-grade stenosis of the vertebral arteries at the dural margin and beyond, left greater than right. 5. 70% stenosis of the right carotid bifurcation. 6. Dense calcifications in the distal left common carotid artery and carotid bifurcation without significant stenosis relative to the more distal vessel. 7. Mild distal right M1 segment stenosis. 8. Moderate proximal left A2 segment stenosis. Electronically Signed   By: San Morelle M.D.   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 EXAM: CT HEAD WITHOUT CONTRAST TECHNIQUE: Contiguous axial images were obtained from the base of the skull through the vertex without intravenous contrast. COMPARISON:  CTA head and neck, brain MRI/MRA, and head CT without contrast 08/25/2017. FINDINGS: Brain: Hyperdense hemorrhage centered in the left thalamus has not significantly changed. Dimensions today are 19 x 20 x 26 millimeters (AP by transverse by CC), for an estimated blood volume of 5 milliliters. No intraventricular or extra-axial extension. Surrounding edema has mildly progressed since 08/25/2017, but regional mass effect is mild and stable. Elsewhere Stable gray-white matter differentiation throughout the brain. No new intracranial hemorrhage. No new intracranial mass effect. No ventriculomegaly. Vascular: Extensive calcified atherosclerosis. Skull: No acute osseous abnormality identified. Sinuses/Orbits: Increased mucosal thickening in the right maxillary sinus but otherwise stable. Other: Stable orbit and scalp soft tissues. IMPRESSION: 1. Stable to slightly smaller left thalamic intra-axial hemorrhage since the presentation CT on 08/25/2017. Mildly increased surrounding edema, but mild regional mass effect is stable. 2. No new intracranial abnormality. Underlying advanced chronic small vessel disease. Electronically Signed   By: Genevie Ann M.D.   On: 08/27/2017 07:29   Ct Angio Neck W Or Wo Contrast  Result Date: 08/25/2017 CLINICAL DATA:  Left flank hemorrhage. Basilar artery stenosis or occlusion. EXAM: CT ANGIOGRAPHY HEAD AND NECK TECHNIQUE: Multidetector CT imaging of the head and neck was performed using the standard protocol during bolus administration of intravenous contrast. Multiplanar CT image reconstructions and MIPs were  obtained to evaluate the vascular anatomy. Carotid stenosis measurements (when applicable) are obtained utilizing NASCET criteria, using the distal internal carotid diameter as the denominator. CONTRAST:  11mL ISOVUE-370 IOPAMIDOL (ISOVUE-370) INJECTION 76% COMPARISON:  CT of the head and MRI and MRA brain from the same day. FINDINGS: Stepped CTA NECK FINDINGS Aortic arch: Extensive atherosclerotic calcifications are present at the aorta and branch vessel origins without significant stenosis or aneurysm. Right carotid system: Scattered mural calcifications are present along the right common carotid artery. Dense calcifications are present at the right carotid bifurcation. Minimal luminal diameter is 1.5 mm this compares with a more normal distal lumen of 5.8 mm. The cervical right ICA is within normal limits of this level. Left carotid system: Atherosclerotic calcifications are present along the wall the left common carotid artery without significant stenosis. Dense calcifications are present at the left carotid bifurcation without a significant stenosis relative to the more distal vessel. Vertebral arteries: Vertebral arteries vertebral arteries originate from the subclavian arteries bilaterally. There is dense calcification at the origin of the right vertebral artery. The vertebral arteries are codominant. There are high-grade near occlusive stenoses of both vertebral arteries at the dural margin. No other stenoses are present in the neck. Skeleton: Endplate degenerative changes are most pronounced at C3-4 C5-6 and C6-7. Extensive dental disease is present. No focal lytic or blastic lesions  are present. Other neck: No significant mucosal or submucosal lesions are present. The palatine tonsils are enlarged bilaterally. No discrete mass lesion is present. Vocal cords are midline and symmetric. There is no significant adenopathy. Salivary glands are within normal limits. Multiple left-sided thyroid nodules are less  than 10 mm. No dominant nodule is present. Upper chest: Lung apices are clear. No focal nodule, mass, or airspace disease present. The thoracic inlet is within normal limits. Review of the MIP images confirms the above findings CTA HEAD FINDINGS Anterior circulation: Atherosclerotic calcifications are present within the cavernous internal carotid arteries bilaterally without a significant stenosis relative to the more distal vessel through the ICA termini. The A1 and M1 segments are within normal limits. There is a proximal bifurcation of the left M1 segment, a normal variant moderate narrowing is present in the distal right M1 segment. MCA branch vessels are intact. Anterior communicating artery is patent. Moderate proximal left A2 segment stenosis is present. Distal small vessel disease is present throughout. Posterior circulation: The right AICA is dominant. A smaller left AICA is present. Neither PICA vessel is evident. There is a fenestration of the vertebrobasilar junction. There is some flow in both vertebral arteries. Stenosis is worse on the left. The basilar artery is occluded just distal to the vertebrobasilar junction at the level of the AICA vessels. A fetal type left posterior cerebral artery is present. The right posterior cerebral artery and basilar tip is reconstituted by a right posterior communicating artery. The basilar tip is reconstituted to the level of the superior cerebellar arteries. The left superior cerebellar artery is occluded or highly stenotic. Proximal PCA branch vessels are intact bilaterally. Distal PCA disease is present bilaterally. Venous sinuses: The dural sinuses are patent. Straight sinus and deep cerebral veins are intact. Cortical veins are unremarkable. Anatomic variants: Fetal type left posterior cerebral artery. Delayed phase: Postcontrast images again demonstrate the left thalamic hemorrhage. There is no enhancement. Calcification and remote lacunar infarct of the left  cerebellum are stable. Review of the MIP images confirms the above findings IMPRESSION: 1. 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 2. Reconstitution of the basilar tip to the level of the superior cerebellar arteries via a right posterior communicating artery. 3. 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 4. High-grade stenosis of the vertebral arteries at the dural margin and beyond, left greater than right. 5. 70% stenosis of the right carotid bifurcation. 6. Dense calcifications in the distal left common carotid artery and carotid bifurcation without significant stenosis relative to the more distal vessel. 7. Mild distal right M1 segment stenosis. 8. Moderate proximal left A2 segment stenosis. Electronically Signed   By: San Morelle M.D.   On: 08/25/2017 17:19   Mr Jodene Nam Head Wo Contrast  Addendum Date: 08/25/2017   ADDENDUM REPORT: 08/25/2017 15:12 ADDENDUM: This study was discussed by telephone with Dr. Kerney Elbe on 08/25/2017 at 15:12 . Electronically Signed   By: Genevie Ann M.D.   On: 08/25/2017 15:12   Result Date: 08/25/2017 CLINICAL DATA:  77 year old male with code stroke presentation and acute left thalamic hemorrhage on head CT earlier today. EXAM: MRI HEAD WITHOUT AND WITH CONTRAST MRA HEAD WITHOUT CONTRAST TECHNIQUE: Multiplanar, multiecho pulse sequences of the brain and surrounding structures were obtained without and with intravenous contrast. Angiographic images of the head were obtained using MRA technique without contrast. CONTRAST:  1mL MULTIHANCE GADOBENATE DIMEGLUMINE 529 MG/ML IV SOLN COMPARISON:  Head CT without contrast  1035 hours. FINDINGS: MRI HEAD FINDINGS Brain: Oval mixed hyper- and hypointense T2 signal and iso- to hypointense T1 signal hemorrhage re-demonstrated in the left thalamus encompassing 20 x 21 x 26 millimeters (AP by transverse by CC) for an estimated blood volume of 5-6 mL. Surrounding edema with  mild regional mass effect. No intraventricular extension identified. No extra-axial extension identified. Associated diffusion susceptibility artifact. No convincing surrounding area of restricted diffusion. There are punctate areas of enhancement within the hemorrhage (series 12, images 26 and 27), but this represents delayed phase enhancement and not arterial phase spot sign. No restricted diffusion elsewhere. There are several superimposed chronic microhemorrhages elsewhere in the left hemisphere. Chronic hemosiderin and/or dystrophic calcification noted in the left cerebellum. 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 No cortical encephalomalacia. No other abnormal intracranial enhancement. No dural thickening. No midline shift, ventriculomegaly, extra-axial collection. Cervicomedullary junction and pituitary are within normal limits. Vascular: The major anterior circulation flow voids are preserved. There is intermittent loss of the posterior circulation flow voids, see MRA findings below. The major dural venous sinuses are enhancing and appear to be patent. Skull and upper cervical spine: Negative visible cervical spine. Normal bone marrow signal. Sinuses/Orbits: Postoperative changes to both globes. Moderate ethmoid sinus mucosal thickening. Other: Visible internal auditory structures appear normal. Negative scalp and face soft tissues. MRA HEAD FINDINGS Antegrade flow in the distal vertebral arteries. Vertebral artery irregularity which may be exacerbated by motion artifact. 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 The distal 3rd of the basilar appears most occluded, although the basilar tip is reconstituted by the right posterior communicating artery. There is a fetal type left PCA  origin. The SCA origins are patent. The bilateral PCA branches are patent without stenosis. There is no flow signal identified within the left thalamic hemorrhage. Antegrade flow in both ICA siphons which are mildly dolichoectatic. No siphon stenosis. Ophthalmic and right posterior communicating artery origins are normal. Patent carotid termini. Normal MCA and ACA origins. The anterior communicating artery and visible ACA branches are within normal limits. The left MCA M1 segment bifurcates early. The left MCA branches appear within normal limits, no stenosis. There is mild to moderate irregularity and stenosis of the right MCA M1 segment. Prominent right until a striate branch arises from this area. The right MCA bifurcation remains patent. The right MCA branches are patent with up to mild irregularity. IMPRESSION: 1. 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 2. Left thalamic hemorrhage is stable from the CT earlier today with no ventricular or extra-axial extension. Mild surrounding edema and regional mass effect. 3. 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 4. Anterior circulation atherosclerosis primarily at the right MCA M1 segment where there is up to moderate stenosis. Electronically Signed: By: Genevie Ann M.D. On: 08/25/2017 15:08   Mr Jeri Cos XB Contrast  Addendum Date: 08/25/2017   ADDENDUM REPORT: 08/25/2017 15:12 ADDENDUM: This study was discussed by telephone with Dr. Kerney Elbe on 08/25/2017 at 15:12 . Electronically Signed   By: Genevie Ann M.D.   On: 08/25/2017 15:12   Result Date: 08/25/2017 CLINICAL DATA:  77 year old  male with code stroke presentation and acute left thalamic hemorrhage on head CT earlier today. EXAM: MRI HEAD WITHOUT AND WITH  CONTRAST MRA HEAD WITHOUT CONTRAST TECHNIQUE: Multiplanar, multiecho pulse sequences of the brain and surrounding structures were obtained without and with intravenous contrast. Angiographic images of the head were obtained using MRA technique without contrast. CONTRAST:  47mL MULTIHANCE GADOBENATE DIMEGLUMINE 529 MG/ML IV SOLN COMPARISON:  Head CT without contrast 1035 hours. FINDINGS: MRI HEAD FINDINGS Brain: Oval mixed hyper- and hypointense T2 signal and iso- to hypointense T1 signal hemorrhage re-demonstrated in the left thalamus encompassing 20 x 21 x 26 millimeters (AP by transverse by CC) for an estimated blood volume of 5-6 mL. Surrounding edema with mild regional mass effect. No intraventricular extension identified. No extra-axial extension identified. Associated diffusion susceptibility artifact. No convincing surrounding area of restricted diffusion. There are punctate areas of enhancement within the hemorrhage (series 12, images 26 and 27), but this represents delayed phase enhancement and not arterial phase spot sign. No restricted diffusion elsewhere. There are several superimposed chronic microhemorrhages elsewhere in the left hemisphere. Chronic hemosiderin and/or dystrophic calcification noted in the left cerebellum. 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 No cortical encephalomalacia. No other abnormal intracranial enhancement. No dural thickening. No midline shift, ventriculomegaly, extra-axial collection. Cervicomedullary junction and pituitary are within normal limits. Vascular: The major anterior circulation flow voids are preserved. There is intermittent loss of the posterior circulation flow voids, see MRA findings below. The major dural venous sinuses are enhancing and appear to be patent. Skull and upper cervical spine: Negative visible cervical spine. Normal bone marrow signal.  Sinuses/Orbits: Postoperative changes to both globes. Moderate ethmoid sinus mucosal thickening. Other: Visible internal auditory structures appear normal. Negative scalp and face soft tissues. MRA HEAD FINDINGS Antegrade flow in the distal vertebral arteries. Vertebral artery irregularity which may be exacerbated by motion artifact. 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 The distal 3rd of the basilar appears most occluded, although the basilar tip is reconstituted by the right posterior communicating artery. There is a fetal type left PCA origin. The SCA origins are patent. The bilateral PCA branches are patent without stenosis. There is no flow signal identified within the left thalamic hemorrhage. Antegrade flow in both ICA siphons which are mildly dolichoectatic. No siphon stenosis. Ophthalmic and right posterior communicating artery origins are normal. Patent carotid termini. Normal MCA and ACA origins. The anterior communicating artery and visible ACA branches are within normal limits. The left MCA M1 segment bifurcates early. The left MCA branches appear within normal limits, no stenosis. There is mild to moderate irregularity and stenosis of the right MCA M1 segment. Prominent right until a striate branch arises from this area. The right MCA bifurcation remains patent. The right MCA branches are patent with up to mild irregularity. IMPRESSION: 1. 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 2. Left thalamic hemorrhage is stable from the CT earlier today with no ventricular or extra-axial extension. Mild surrounding edema and regional mass effect. 3. Underlying advanced chronic small vessel disease in the deep gray matter and cerebral white matter, with  several other chronic micro hemorrhages in the left hemisphere. 4. Anterior circulation atherosclerosis primarily at the right MCA M1 segment  where there is up to moderate stenosis. Electronically Signed: By: Genevie Ann M.D. On: 08/25/2017 15:08   Dg Chest Port 1 View  Result Date: 08/26/2017 CLINICAL DATA:  Desaturation with shortness of breath EXAM: PORTABLE CHEST 1 VIEW COMPARISON:  08/25/2017, 06/24/2015 FINDINGS: Linear atelectasis at the left lung base. Borderline cardiomegaly with aortic atherosclerosis. No pleural effusion or pneumothorax. IMPRESSION: 1. Linear atelectasis or scarring at the left base 2. Borderline to mild cardiomegaly Electronically Signed   By: Donavan Foil M.D.   On: 08/26/2017 20:17   Dg Chest Portable 1 View  Result Date: 08/25/2017 CLINICAL DATA:  Code stroke. History of atrial fibrillation, coronary artery disease with stent placement, previous MI, current smoker. EXAM: PORTABLE CHEST 1 VIEW COMPARISON:  Chest x-ray of June 24, 2015 FINDINGS: The lungs are well-expanded. There is no focal infiltrate. The interstitial markings are coarse though stable. The heart is top-normal in size. The pulmonary vascularity is normal. There is calcification in the wall of the aortic arch. The bony thorax exhibits no acute abnormality. IMPRESSION: Mild chronic bronchitic changes, stable. No pneumonia, CHF, nor other acute cardiopulmonary abnormality. Thoracic aortic atherosclerosis. Electronically Signed   By: David  Martinique M.D.   On: 08/25/2017 13:33   Ct Head Code Stroke Wo Contrast  Result Date: 08/25/2017 CLINICAL DATA:  Code stroke. 77 year old male with right side weakness since 0930 hours. EXAM: CT HEAD WITHOUT CONTRAST TECHNIQUE: Contiguous axial images were obtained from the base of the skull through the vertex without intravenous contrast. COMPARISON:  None. FINDINGS: Brain: Oval hyperdense hemorrhage occupying the left thalamus encompasses 20 x 26 x 29 millimeters (AP by  transverse by CC) for an estimated intra-axial blood volume of 8 milliliters. There is mild surrounding edema and regional mass effect. No intraventricular or extra-axial extension. Elsewhere there are small dystrophic calcifications in the basal ganglia, and a coarse dystrophic calcification in the left cerebellar hemisphere which appears to be associated with a small chronic left cerebellar infarct. No other intracranial hemorrhage identified. Confluent and widespread bilateral cerebral white matter hypodensity. Bilateral deep gray matter heterogeneity is associated. No cerebral cortical encephalomalacia is identified. No midline shift, ventriculomegaly, mass effect, or acute cortically based infarct. Vascular: Extensive intracranial calcified atherosclerosis with prominent involvement of the right MCA. No suspicious intracranial vascular hyperdensity. Skull: Negative. Sinuses/Orbits: Moderate ethmoid sinus mucosal thickening. Mild frontal sinus mucosal thickening. Other visible paranasal sinuses and mastoids are well pneumatized. Other: Postoperative changes to both globes. Otherwise negative orbits soft tissues. Visualized scalp soft tissues are within normal limits. ASPECTS [facility] Stroke Program Early CT Score) Not applicable, acute hemorrhage. IMPRESSION: 1. Oval left thalamic hemorrhage with an estimated blood volume of 8 mL. Mild surrounding edema and regional mass effect. No intraventricular or extra-axial extension of blood. 2. Underlying advanced chronic small vessel disease, indicating likely small vessel etiology of #1. 3. 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 4. Extensive intracranial calcified atherosclerosis. Critical Value/emergent results were called by telephone at the time of interpretation on 08/25/2017 at 1052 hours to Dr. Francine Graven , who verbally acknowledged these results. Electronically Signed   By: Genevie Ann M.D.   On: 08/25/2017 10:52   TTE - Left  ventricle: The cavity size was normal. Wall thickness was   increased in a pattern of moderate LVH. Systolic function was   normal. The estimated ejection fraction was in the range of 55%   to 60%. Wall motion was normal; there were no regional wall   motion abnormalities. -  Aortic valve: Trileaflet; normal thickness, mildly calcified   leaflets. - Aortic root: The aortic root was mildly dilated. - Mitral valve: Mildly calcified annulus. There was mild   regurgitation. - Left atrium: The atrium was moderately dilated. - Right atrium: The atrium was moderately dilated. Impressions: - No cardiac source of emboli was indentified.   PHYSICAL EXAM  Temp:  [98 F (36.7 C)-99.5 F (37.5 C)] 98.7 F (37.1 C) (04/13 0934) Pulse Rate:  [39-143] 72 (04/13 0934) Resp:  [11-21] 16 (04/13 0934) BP: (113-155)/(68-111) 121/82 (04/13 0934) SpO2:  [94 %-98 %] 98 % (04/13 0934) Weight:  [200 lb 13.4 oz (91.1 kg)] 200 lb 13.4 oz (91.1 kg) (04/12 2225)  General - Well nourished, well developed, drowsy but easily arousable.  Ophthalmologic - fundi not visualized due to noncooperation.  Cardiovascular - irregularly irregular heart rate and rhythm.  Neuro - drowsy sleepy, easily arousable, orientated to self and place but not time or people. Paucity of speech, able to follow simple commands, naming 0/3 but able to repeat. Visual field test not cooperative.  Mild right nasolabial fold flattening.  Tongue midline.  Pronator drift on the right upper extremity, right lower extremity DF 4+/5.  Otherwise normal strength.  DTRs 1+, no Babinski.  Sensation symmetrical.  Coordination not cooperative.  Gait not tested   ASSESSMENT/PLAN Mr. ULYSSE SIEMEN is a 77 y.o. 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 No tPA given due to [place].    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   Repeat CT hematoma stable  2D Echo EF 55-60%  LDL 101  HgbA1c 6.4  Heparin subq for VTE prophylaxis Fall precautions  Diet heart healthy/carb modified Room service appropriate? Yes; Fluid consistency: Thin   clopidogrel 75 mg daily and Xarelto (rivaroxaban) daily prior to admission, now on No antithrombotic due to [place]. Will consider ASA 81mg  5-7 days if neuro stable post ICH, and repeat CT in 2-3 weeks, if ICH resolves, add eliquis to ASA 81mg .   Patient counseled to be compliant with his antithrombotic medications  Ongoing aggressive stroke risk factor management  Therapy recommendations:  CIR  Disposition:  Potential CIR on Monday   Wheezing with SOB  Mild wheezing and SOB  CXR pending  EKG pending  duoneb PRN  Lasix 40mg  once  Leukocytosis - WBC 7.4->10.3->11.7  Close monitoring - consider antibiotics if needed  Afib on Xarelto  Tele showed persistent AFib  Rate controlled, no RVR  Hold off AC for now.   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 Avoid hypotension due to BA occlusion, avoid hypertension due to anticoagulation use.  Need BP check closely at  home  Hyperlipidemia  Home meds:  crestor 20   LDL 101, goal < 70  Now on crestor 20  Continue statin at discharge  Sundowning  May have underlying cognitive impairment  on Seroquel 25 nightly  Improved last night  Other Stroke Risk Factors  Hx stroke/TIA - imaging showed old left cerebellar infarct with calcification  Obstructive sleep apnea  Other Active Problems    Hospital day # 3  I spent  35 minutes in total face-to-face time with the patient, more than 50% of which was spent in counseling and coordination of care, reviewing test results, images and medication, and discussing the diagnosis of ICH and SOB, treatment plan and potential prognosis. This patient's care requiresreview of multiple databases, neurological assessment, discussion with family, other specialists and medical decision making of high complexity.   Rosalin Hawking, MD PhD Stroke Neurology 08/28/2017 1:35 PM    To contact Stroke Continuity provider, please refer to [email or web address] After hours, contact General Neurology

## 2017-08-28 NOTE — Progress Notes (Signed)
Patient is ordered to have 30cc/hr infusing. When transferred earlier, he had no NS infusing, and I was told in report he was saline locked. He previously was agitated and had to be restrained. It was probably stopped at that time. I paged neuro to clarify order. Awaiting call back

## 2017-08-28 NOTE — Progress Notes (Signed)
Neurology returned page. Gave order to stop normal saline.

## 2017-08-29 LAB — GLUCOSE, CAPILLARY
GLUCOSE-CAPILLARY: 122 mg/dL — AB (ref 65–99)
GLUCOSE-CAPILLARY: 169 mg/dL — AB (ref 65–99)
Glucose-Capillary: 104 mg/dL — ABNORMAL HIGH (ref 65–99)
Glucose-Capillary: 117 mg/dL — ABNORMAL HIGH (ref 65–99)

## 2017-08-29 LAB — BASIC METABOLIC PANEL
Anion gap: 10 (ref 5–15)
BUN: 31 mg/dL — ABNORMAL HIGH (ref 6–20)
CALCIUM: 8.6 mg/dL — AB (ref 8.9–10.3)
CO2: 23 mmol/L (ref 22–32)
Chloride: 108 mmol/L (ref 101–111)
Creatinine, Ser: 1.3 mg/dL — ABNORMAL HIGH (ref 0.61–1.24)
GFR calc non Af Amer: 51 mL/min — ABNORMAL LOW (ref >60)
GFR, EST AFRICAN AMERICAN: 59 mL/min — AB (ref >60)
Glucose, Bld: 111 mg/dL — ABNORMAL HIGH (ref 65–99)
POTASSIUM: 3.6 mmol/L (ref 3.5–5.1)
SODIUM: 141 mmol/L (ref 135–145)

## 2017-08-29 LAB — CBC
HCT: 44.5 % (ref 39.0–52.0)
Hemoglobin: 14.7 g/dL (ref 13.0–17.0)
MCH: 30.1 pg (ref 26.0–34.0)
MCHC: 33 g/dL (ref 30.0–36.0)
MCV: 91.2 fL (ref 78.0–100.0)
PLATELETS: 172 10*3/uL (ref 150–400)
RBC: 4.88 MIL/uL (ref 4.22–5.81)
RDW: 15.2 % (ref 11.5–15.5)
WBC: 8.9 10*3/uL (ref 4.0–10.5)

## 2017-08-29 NOTE — Progress Notes (Signed)
STROKE TEAM PROGRESS NOTE   SUBJECTIVE (INTERVAL HISTORY) His son is at bedside.  He is sleepy but easily arousable. Language improved. Had BM yesterday and breathing is much better. No acute issue. Pending CIR tomorrow.   OBJECTIVE Temp:  [98.3 F (36.8 C)-99.7 F (37.6 C)] 98.6 F (37 C) (04/14 0400) Pulse Rate:  [63-89] 63 (04/14 0400) Cardiac Rhythm: Atrial fibrillation (04/13 1900) Resp:  [16-20] 20 (04/14 0400) BP: (107-150)/(70-86) 107/77 (04/14 0400) SpO2:  [93 %-98 %] 94 % (04/14 0400)  Recent Labs  Lab 08/28/17 0657 08/28/17 1244 08/28/17 1715 08/28/17 2135 08/29/17 0647  GLUCAP 100* 98 109* 103* 104*   Recent Labs  Lab 08/25/17 1034 08/25/17 1038 08/27/17 0500 08/28/17 0716 08/29/17 0522  NA 142 144 139 138 141  K 4.4 4.3 4.3 4.0 3.6  CL 107 107 107 109 108  CO2 25  --  22 19* 23  GLUCOSE 141* 137* 113* 103* 111*  BUN 27* 25* 26* 28* 31*  CREATININE 1.18 1.10 1.33* 1.02 1.30*  CALCIUM 9.1  --  8.9 8.7* 8.6*   Recent Labs  Lab 08/25/17 1034  AST 15  ALT 14*  ALKPHOS 72  BILITOT 0.9  PROT 7.1  ALBUMIN 3.9   Recent Labs  Lab 08/25/17 1034 08/25/17 1038 08/27/17 0500 08/28/17 0716 08/29/17 0522  WBC 7.4  --  10.3 11.7* 8.9  NEUTROABS 4.3  --   --   --   --   HGB 15.1 16.7 14.1 14.4 14.7  HCT 48.0 49.0 42.7 44.2 44.5  MCV 91.8  --  91.6 90.2 91.2  PLT 183  --  172 165 172   No results for input(s): CKTOTAL, CKMB, CKMBINDEX, TROPONINI in the last 168 hours. Recent Labs    08/27/17 0500 08/28/17 0716  LABPROT 13.8 14.1  INR 1.07 1.10   No results for input(s): COLORURINE, LABSPEC, PHURINE, GLUCOSEU, HGBUR, BILIRUBINUR, KETONESUR, PROTEINUR, UROBILINOGEN, NITRITE, LEUKOCYTESUR in the last 72 hours.  Invalid input(s): APPERANCEUR     Component Value Date/Time   CHOL 162 08/25/2017 1505   TRIG 112 08/25/2017 1505   HDL 39 (L) 08/25/2017 1505   CHOLHDL 4.2 08/25/2017 1505   VLDL 22 08/25/2017 1505   LDLCALC 101 (H) 08/25/2017 1505    Lab Results  Component Value Date   HGBA1C 6.4 (H) 08/25/2017      Component Value Date/Time   LABOPIA NONE DETECTED 08/26/2017 0815   COCAINSCRNUR NONE DETECTED 08/26/2017 0815   LABBENZ NONE DETECTED 08/26/2017 0815   AMPHETMU NONE DETECTED 08/26/2017 0815   THCU NONE DETECTED 08/26/2017 0815   LABBARB NONE DETECTED 08/26/2017 0815    Recent Labs  Lab 08/25/17 1034  ETH <10    I have personally reviewed the radiological images below and agree with the radiology interpretations.  Ct Angio Head W Or Wo Contrast  Result Date: 08/25/2017 CLINICAL DATA:  Left flank hemorrhage. Basilar artery stenosis or occlusion. EXAM: CT ANGIOGRAPHY HEAD AND NECK TECHNIQUE: Multidetector CT imaging of the head and neck was performed using the standard protocol during bolus administration of intravenous contrast. Multiplanar CT image reconstructions and MIPs were obtained to evaluate the vascular anatomy. Carotid stenosis measurements (when applicable) are obtained utilizing NASCET criteria, using the distal internal carotid diameter as the denominator. CONTRAST:  18mL ISOVUE-370 IOPAMIDOL (ISOVUE-370) INJECTION 76% COMPARISON:  CT of the head and MRI and MRA brain from the same day. FINDINGS: Stepped CTA NECK FINDINGS Aortic arch: Extensive atherosclerotic calcifications are present at the  aorta and branch vessel origins without significant stenosis or aneurysm. Right carotid system: Scattered mural calcifications are present along the right common carotid artery. Dense calcifications are present at the right carotid bifurcation. Minimal luminal diameter is 1.5 mm this compares with a more normal distal lumen of 5.8 mm. The cervical right ICA is within normal limits of this level. Left carotid system: Atherosclerotic calcifications are present along the wall the left common carotid artery without significant stenosis. Dense calcifications are present at the left carotid bifurcation without a significant  stenosis relative to the more distal vessel. Vertebral arteries: Vertebral arteries vertebral arteries originate from the subclavian arteries bilaterally. There is dense calcification at the origin of the right vertebral artery. The vertebral arteries are codominant. There are high-grade near occlusive stenoses of both vertebral arteries at the dural margin. No other stenoses are present in the neck. Skeleton: Endplate degenerative changes are most pronounced at C3-4 C5-6 and C6-7. Extensive dental disease is present. No focal lytic or blastic lesions are present. Other neck: No significant mucosal or submucosal lesions are present. The palatine tonsils are enlarged bilaterally. No discrete mass lesion is present. Vocal cords are midline and symmetric. There is no significant adenopathy. Salivary glands are within normal limits. Multiple left-sided thyroid nodules are less than 10 mm. No dominant nodule is present. Upper chest: Lung apices are clear. No focal nodule, mass, or airspace disease present. The thoracic inlet is within normal limits. Review of the MIP images confirms the above findings CTA HEAD FINDINGS Anterior circulation: Atherosclerotic calcifications are present within the cavernous internal carotid arteries bilaterally without a significant stenosis relative to the more distal vessel through the ICA termini. The A1 and M1 segments are within normal limits. There is a proximal bifurcation of the left M1 segment, a normal variant moderate narrowing is present in the distal right M1 segment. MCA branch vessels are intact. Anterior communicating artery is patent. Moderate proximal left A2 segment stenosis is present. Distal small vessel disease is present throughout. Posterior circulation: The right AICA is dominant. A smaller left AICA is present. Neither PICA vessel is evident. There is a fenestration of the vertebrobasilar junction. There is some flow in both vertebral arteries. Stenosis is worse on  the left. The basilar artery is occluded just distal to the vertebrobasilar junction at the level of the AICA vessels. A fetal type left posterior cerebral artery is present. The right posterior cerebral artery and basilar tip is reconstituted by a right posterior communicating artery. The basilar tip is reconstituted to the level of the superior cerebellar arteries. The left superior cerebellar artery is occluded or highly stenotic. Proximal PCA branch vessels are intact bilaterally. Distal PCA disease is present bilaterally. Venous sinuses: The dural sinuses are patent. Straight sinus and deep cerebral veins are intact. Cortical veins are unremarkable. Anatomic variants: Fetal type left posterior cerebral artery. Delayed phase: Postcontrast images again demonstrate the left thalamic hemorrhage. There is no enhancement. Calcification and remote lacunar infarct of the left cerebellum are stable. Review of the MIP images confirms the above findings IMPRESSION: 1. 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 2. Reconstitution of the basilar tip to the level of the superior cerebellar arteries via a right posterior communicating artery. 3. 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 4. High-grade stenosis of the vertebral arteries at the dural margin and beyond, left greater than right. 5. 70% stenosis of the right carotid bifurcation. 6. Dense calcifications in the distal  left common carotid artery and carotid bifurcation without significant stenosis relative to the more distal vessel. 7. Mild distal right M1 segment stenosis. 8. Moderate proximal left A2 segment stenosis. Electronically Signed   By: San Morelle M.D.   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 EXAM: CT HEAD WITHOUT CONTRAST TECHNIQUE: Contiguous axial images were obtained from the base of the skull through the vertex without intravenous contrast. COMPARISON:  CTA head and neck, brain MRI/MRA, and head CT without contrast 08/25/2017. FINDINGS: Brain: Hyperdense hemorrhage centered in the left thalamus has not significantly changed. Dimensions today are 19 x 20 x 26 millimeters (AP by transverse by CC), for an estimated blood volume of 5 milliliters. No intraventricular or extra-axial extension. Surrounding edema has mildly progressed since 08/25/2017, but regional mass effect is mild and stable. Elsewhere Stable gray-white matter differentiation throughout the brain. No new intracranial hemorrhage. No new intracranial mass effect. No ventriculomegaly. Vascular: Extensive calcified atherosclerosis. Skull: No acute osseous abnormality identified. Sinuses/Orbits: Increased mucosal thickening in the right maxillary sinus but otherwise stable. Other: Stable orbit and scalp soft tissues. IMPRESSION: 1. Stable to slightly smaller left thalamic intra-axial hemorrhage since the presentation CT on 08/25/2017. Mildly increased surrounding edema, but mild regional mass effect is stable. 2. No new intracranial abnormality. Underlying advanced chronic small vessel disease. Electronically Signed   By: Genevie Ann M.D.   On: 08/27/2017 07:29   Ct Angio Neck W Or Wo Contrast  Result Date: 08/25/2017 CLINICAL DATA:  Left flank hemorrhage. Basilar artery stenosis or occlusion. EXAM: CT ANGIOGRAPHY HEAD AND NECK TECHNIQUE: Multidetector CT imaging of the head and neck was performed using the standard protocol during bolus administration of intravenous contrast. Multiplanar CT image reconstructions and MIPs were obtained to evaluate the vascular anatomy. Carotid stenosis measurements (when applicable) are obtained utilizing NASCET criteria, using the distal internal carotid diameter as the denominator. CONTRAST:   25mL ISOVUE-370 IOPAMIDOL (ISOVUE-370) INJECTION 76% COMPARISON:  CT of the head and MRI and MRA brain from the same day. FINDINGS: Stepped CTA NECK FINDINGS Aortic arch: Extensive atherosclerotic calcifications are present at the aorta and branch vessel origins without significant stenosis or aneurysm. Right carotid system: Scattered mural calcifications are present along the right common carotid artery. Dense calcifications are present at the right carotid bifurcation. Minimal luminal diameter is 1.5 mm this compares with a more normal distal lumen of 5.8 mm. The cervical right ICA is within normal limits of this level. Left carotid system: Atherosclerotic calcifications are present along the wall the left common carotid artery without significant stenosis. Dense calcifications are present at the left carotid bifurcation without a significant stenosis relative to the more distal vessel. Vertebral arteries: Vertebral arteries vertebral arteries originate from the subclavian arteries bilaterally. There is dense calcification at the origin of the right vertebral artery. The vertebral arteries are codominant. There are high-grade near occlusive stenoses of both vertebral arteries at the dural margin. No other stenoses are present in the neck. Skeleton: Endplate degenerative changes are most pronounced at C3-4 C5-6 and C6-7. Extensive dental disease is present. No focal lytic or blastic lesions are present. Other neck: No significant mucosal or submucosal lesions are present. The palatine tonsils are enlarged bilaterally. No discrete mass lesion is present. Vocal cords are midline and symmetric. There is no significant adenopathy. Salivary glands  are within normal limits. Multiple left-sided thyroid nodules are less than 10 mm. No dominant nodule is present. Upper chest: Lung apices are clear. No focal nodule, mass, or airspace disease present. The thoracic inlet is within normal limits. Review of the MIP images  confirms the above findings CTA HEAD FINDINGS Anterior circulation: Atherosclerotic calcifications are present within the cavernous internal carotid arteries bilaterally without a significant stenosis relative to the more distal vessel through the ICA termini. The A1 and M1 segments are within normal limits. There is a proximal bifurcation of the left M1 segment, a normal variant moderate narrowing is present in the distal right M1 segment. MCA branch vessels are intact. Anterior communicating artery is patent. Moderate proximal left A2 segment stenosis is present. Distal small vessel disease is present throughout. Posterior circulation: The right AICA is dominant. A smaller left AICA is present. Neither PICA vessel is evident. There is a fenestration of the vertebrobasilar junction. There is some flow in both vertebral arteries. Stenosis is worse on the left. The basilar artery is occluded just distal to the vertebrobasilar junction at the level of the AICA vessels. A fetal type left posterior cerebral artery is present. The right posterior cerebral artery and basilar tip is reconstituted by a right posterior communicating artery. The basilar tip is reconstituted to the level of the superior cerebellar arteries. The left superior cerebellar artery is occluded or highly stenotic. Proximal PCA branch vessels are intact bilaterally. Distal PCA disease is present bilaterally. Venous sinuses: The dural sinuses are patent. Straight sinus and deep cerebral veins are intact. Cortical veins are unremarkable. Anatomic variants: Fetal type left posterior cerebral artery. Delayed phase: Postcontrast images again demonstrate the left thalamic hemorrhage. There is no enhancement. Calcification and remote lacunar infarct of the left cerebellum are stable. Review of the MIP images confirms the above findings IMPRESSION: 1. 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 2. Reconstitution of the basilar tip  to the level of the superior cerebellar arteries via a right posterior communicating artery. 3. 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 4. High-grade stenosis of the vertebral arteries at the dural margin and beyond, left greater than right. 5. 70% stenosis of the right carotid bifurcation. 6. Dense calcifications in the distal left common carotid artery and carotid bifurcation without significant stenosis relative to the more distal vessel. 7. Mild distal right M1 segment stenosis. 8. Moderate proximal left A2 segment stenosis. Electronically Signed   By: San Morelle M.D.   On: 08/25/2017 17:19   Mr Jodene Nam Head Wo Contrast  Addendum Date: 08/25/2017   ADDENDUM REPORT: 08/25/2017 15:12 ADDENDUM: This study was discussed by telephone with Dr. Kerney Elbe on 08/25/2017 at 15:12 . Electronically Signed   By: Genevie Ann M.D.   On: 08/25/2017 15:12   Result Date: 08/25/2017 CLINICAL DATA:  77 year old male with code stroke presentation and acute left thalamic hemorrhage on head CT earlier today. EXAM: MRI HEAD WITHOUT AND WITH CONTRAST MRA HEAD WITHOUT CONTRAST TECHNIQUE: Multiplanar, multiecho pulse sequences of the brain and surrounding structures were obtained without and with intravenous contrast. Angiographic images of the head were obtained using MRA technique without contrast. CONTRAST:  69mL MULTIHANCE GADOBENATE DIMEGLUMINE 529 MG/ML IV SOLN COMPARISON:  Head CT without contrast 1035 hours. FINDINGS: MRI HEAD FINDINGS Brain: Oval mixed hyper- and hypointense T2 signal and iso- to hypointense T1 signal hemorrhage re-demonstrated in the left thalamus encompassing 20 x 21 x 26 millimeters (AP by transverse by  CC) for an estimated blood volume of 5-6 mL. Surrounding edema with mild regional mass effect. No intraventricular extension identified. No extra-axial extension identified. Associated diffusion susceptibility artifact. No convincing surrounding area of restricted  diffusion. There are punctate areas of enhancement within the hemorrhage (series 12, images 26 and 27), but this represents delayed phase enhancement and not arterial phase spot sign. No restricted diffusion elsewhere. There are several superimposed chronic microhemorrhages elsewhere in the left hemisphere. Chronic hemosiderin and/or dystrophic calcification noted in the left cerebellum. 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 No cortical encephalomalacia. No other abnormal intracranial enhancement. No dural thickening. No midline shift, ventriculomegaly, extra-axial collection. Cervicomedullary junction and pituitary are within normal limits. Vascular: The major anterior circulation flow voids are preserved. There is intermittent loss of the posterior circulation flow voids, see MRA findings below. The major dural venous sinuses are enhancing and appear to be patent. Skull and upper cervical spine: Negative visible cervical spine. Normal bone marrow signal. Sinuses/Orbits: Postoperative changes to both globes. Moderate ethmoid sinus mucosal thickening. Other: Visible internal auditory structures appear normal. Negative scalp and face soft tissues. MRA HEAD FINDINGS Antegrade flow in the distal vertebral arteries. Vertebral artery irregularity which may be exacerbated by motion artifact. 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 The distal 3rd of the basilar appears most occluded, although the basilar tip is reconstituted by the right posterior communicating artery. There is a fetal type left PCA origin. The SCA origins are patent. The bilateral PCA branches are patent without stenosis. There is no flow signal identified within the left thalamic hemorrhage. Antegrade flow in both ICA siphons  which are mildly dolichoectatic. No siphon stenosis. Ophthalmic and right posterior communicating artery origins are normal. Patent carotid termini. Normal MCA and ACA origins. The anterior communicating artery and visible ACA branches are within normal limits. The left MCA M1 segment bifurcates early. The left MCA branches appear within normal limits, no stenosis. There is mild to moderate irregularity and stenosis of the right MCA M1 segment. Prominent right until a striate branch arises from this area. The right MCA bifurcation remains patent. The right MCA branches are patent with up to mild irregularity. IMPRESSION: 1. 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 2. Left thalamic hemorrhage is stable from the CT earlier today with no ventricular or extra-axial extension. Mild surrounding edema and regional mass effect. 3. 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 4. Anterior circulation atherosclerosis primarily at the right MCA M1 segment where there is up to moderate stenosis. Electronically Signed: By: Genevie Ann M.D. On: 08/25/2017 15:08   Mr Jeri Cos JJ Contrast  Addendum Date: 08/25/2017   ADDENDUM REPORT: 08/25/2017 15:12 ADDENDUM: This study was discussed by telephone with Dr. Kerney Elbe on 08/25/2017 at 15:12 . Electronically Signed   By: Genevie Ann M.D.   On: 08/25/2017 15:12   Result Date: 08/25/2017 CLINICAL DATA:  77 year old male with code stroke presentation and acute left thalamic hemorrhage on head CT earlier today. EXAM: MRI HEAD WITHOUT AND WITH CONTRAST MRA HEAD WITHOUT CONTRAST TECHNIQUE: Multiplanar, multiecho pulse sequences of the brain and surrounding structures  were obtained without and with intravenous contrast. Angiographic images of  the head were obtained using MRA technique without contrast. CONTRAST:  66mL MULTIHANCE GADOBENATE DIMEGLUMINE 529 MG/ML IV SOLN COMPARISON:  Head CT without contrast 1035 hours. FINDINGS: MRI HEAD FINDINGS Brain: Oval mixed hyper- and hypointense T2 signal and iso- to hypointense T1 signal hemorrhage re-demonstrated in the left thalamus encompassing 20 x 21 x 26 millimeters (AP by transverse by CC) for an estimated blood volume of 5-6 mL. Surrounding edema with mild regional mass effect. No intraventricular extension identified. No extra-axial extension identified. Associated diffusion susceptibility artifact. No convincing surrounding area of restricted diffusion. There are punctate areas of enhancement within the hemorrhage (series 12, images 26 and 27), but this represents delayed phase enhancement and not arterial phase spot sign. No restricted diffusion elsewhere. There are several superimposed chronic microhemorrhages elsewhere in the left hemisphere. Chronic hemosiderin and/or dystrophic calcification noted in the left cerebellum. 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 No cortical encephalomalacia. No other abnormal intracranial enhancement. No dural thickening. No midline shift, ventriculomegaly, extra-axial collection. Cervicomedullary junction and pituitary are within normal limits. Vascular: The major anterior circulation flow voids are preserved. There is intermittent loss of the posterior circulation flow voids, see MRA findings below. The major dural venous sinuses are enhancing and appear to be patent. Skull and upper cervical spine: Negative visible cervical spine. Normal bone marrow signal. Sinuses/Orbits: Postoperative changes to both globes. Moderate ethmoid sinus mucosal thickening. Other: Visible internal auditory structures appear normal. Negative scalp and face soft tissues. MRA HEAD FINDINGS  Antegrade flow in the distal vertebral arteries. Vertebral artery irregularity which may be exacerbated by motion artifact. 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 The distal 3rd of the basilar appears most occluded, although the basilar tip is reconstituted by the right posterior communicating artery. There is a fetal type left PCA origin. The SCA origins are patent. The bilateral PCA branches are patent without stenosis. There is no flow signal identified within the left thalamic hemorrhage. Antegrade flow in both ICA siphons which are mildly dolichoectatic. No siphon stenosis. Ophthalmic and right posterior communicating artery origins are normal. Patent carotid termini. Normal MCA and ACA origins. The anterior communicating artery and visible ACA branches are within normal limits. The left MCA M1 segment bifurcates early. The left MCA branches appear within normal limits, no stenosis. There is mild to moderate irregularity and stenosis of the right MCA M1 segment. Prominent right until a striate branch arises from this area. The right MCA bifurcation remains patent. The right MCA branches are patent with up to mild irregularity. IMPRESSION: 1. 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 2. Left thalamic hemorrhage is stable from the CT earlier today with no ventricular or extra-axial extension. Mild surrounding edema and regional mass effect. 3. 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 4. Anterior circulation atherosclerosis primarily at the right MCA M1 segment where there is up to moderate stenosis. Electronically Signed: By: Genevie Ann  M.D. On: 08/25/2017 15:08   Dg Chest Port 1 View  Result Date: 08/28/2017 CLINICAL DATA:  Shortness of breath.  Low O2  levels. EXAM: PORTABLE CHEST 1 VIEW COMPARISON:  Chest x-rays dated 08/26/2017 and 08/25/2017. FINDINGS: Mild cardiomegaly is stable. Atherosclerotic changes noted at the aortic arch. Lungs are clear. No pleural effusion or pneumothorax seen. No acute or suspicious osseous finding. IMPRESSION: 1. No active disease.  No evidence of pneumonia or pulmonary edema. 2. Stable mild cardiomegaly. 3. Aortic atherosclerosis. Electronically Signed   By: Franki Cabot M.D.   On: 08/28/2017 13:52   Dg Chest Port 1 View  Result Date: 08/26/2017 CLINICAL DATA:  Desaturation with shortness of breath EXAM: PORTABLE CHEST 1 VIEW COMPARISON:  08/25/2017, 06/24/2015 FINDINGS: Linear atelectasis at the left lung base. Borderline cardiomegaly with aortic atherosclerosis. No pleural effusion or pneumothorax. IMPRESSION: 1. Linear atelectasis or scarring at the left base 2. Borderline to mild cardiomegaly Electronically Signed   By: Donavan Foil M.D.   On: 08/26/2017 20:17   Dg Chest Portable 1 View  Result Date: 08/25/2017 CLINICAL DATA:  Code stroke. History of atrial fibrillation, coronary artery disease with stent placement, previous MI, current smoker. EXAM: PORTABLE CHEST 1 VIEW COMPARISON:  Chest x-ray of June 24, 2015 FINDINGS: The lungs are well-expanded. There is no focal infiltrate. The interstitial markings are coarse though stable. The heart is top-normal in size. The pulmonary vascularity is normal. There is calcification in the wall of the aortic arch. The bony thorax exhibits no acute abnormality. IMPRESSION: Mild chronic bronchitic changes, stable. No pneumonia, CHF, nor other acute cardiopulmonary abnormality. Thoracic aortic atherosclerosis. Electronically Signed   By: David  Martinique M.D.   On: 08/25/2017 13:33   Ct Head Code Stroke Wo Contrast  Result Date: 08/25/2017 CLINICAL DATA:   Code stroke. 77 year old male with right side weakness since 0930 hours. EXAM: CT HEAD WITHOUT CONTRAST TECHNIQUE: Contiguous axial images were obtained from the base of the skull through the vertex without intravenous contrast. COMPARISON:  None. FINDINGS: Brain: Oval hyperdense hemorrhage occupying the left thalamus encompasses 20 x 26 x 29 millimeters (AP by transverse by CC) for an estimated intra-axial blood volume of 8 milliliters. There is mild surrounding edema and regional mass effect. No intraventricular or extra-axial extension. Elsewhere there are small dystrophic calcifications in the basal ganglia, and a coarse dystrophic calcification in the left cerebellar hemisphere which appears to be associated with a small chronic left cerebellar infarct. No other intracranial hemorrhage identified. Confluent and widespread bilateral cerebral white matter hypodensity. Bilateral deep gray matter heterogeneity is associated. No cerebral cortical encephalomalacia is identified. No midline shift, ventriculomegaly, mass effect, or acute cortically based infarct. Vascular: Extensive intracranial calcified atherosclerosis with prominent involvement of the right MCA. No suspicious intracranial vascular hyperdensity. Skull: Negative. Sinuses/Orbits: Moderate ethmoid sinus mucosal thickening. Mild frontal sinus mucosal thickening. Other visible paranasal sinuses and mastoids are well pneumatized. Other: Postoperative changes to both globes. Otherwise negative orbits soft tissues. Visualized scalp soft tissues are within normal limits. ASPECTS [facility] Stroke Program Early CT Score) Not applicable, acute hemorrhage. IMPRESSION: 1. Oval left thalamic hemorrhage with an estimated blood volume of 8 mL. Mild surrounding edema and regional mass effect. No intraventricular or extra-axial extension of blood. 2. Underlying advanced chronic small vessel disease, indicating likely small vessel etiology of #1. 3. 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 4. Extensive intracranial calcified atherosclerosis. Critical Value/emergent results were called by telephone at the time of interpretation on 08/25/2017 at 1052 hours to Dr. Francine Graven , who verbally acknowledged these results. Electronically Signed   By: Genevie Ann M.D.   On:  08/25/2017 10:52   TTE - Left ventricle: The cavity size was normal. Wall thickness was   increased in a pattern of moderate LVH. Systolic function was   normal. The estimated ejection fraction was in the range of 55%   to 60%. Wall motion was normal; there were no regional wall   motion abnormalities. - Aortic valve: Trileaflet; normal thickness, mildly calcified   leaflets. - Aortic root: The aortic root was mildly dilated. - Mitral valve: Mildly calcified annulus. There was mild   regurgitation. - Left atrium: The atrium was moderately dilated. - Right atrium: The atrium was moderately dilated. Impressions: - No cardiac source of emboli was indentified.   PHYSICAL EXAM  Temp:  [98.3 F (36.8 C)-99.7 F (37.6 C)] 98.6 F (37 C) (04/14 0400) Pulse Rate:  [63-89] 63 (04/14 0400) Resp:  [16-20] 20 (04/14 0400) BP: (107-150)/(70-86) 107/77 (04/14 0400) SpO2:  [93 %-98 %] 94 % (04/14 0400)  General - Well nourished, well developed, drowsy but easily arousable.  Ophthalmologic - fundi not visualized due to noncooperation.  Cardiovascular - irregularly irregular heart rate and rhythm.  Neuro - drowsy sleepy, easily arousable, orientated to self and place, people, year but not to month. Paucity of speech, able to follow simple commands, naming 2/4, able to repeat. Visual field test not cooperative.  Mild right nasolabial fold flattening.  Tongue midline.  Pronator drift on the right upper extremity, right lower extremity DF 4+/5.  Otherwise normal strength.  DTRs 1+, no Babinski.  Sensation symmetrical.  Coordination not cooperative.  Gait not  tested   ASSESSMENT/PLAN Mr. Eugene [name] is a 77 y.o. 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 No tPA given due to [place].    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   Repeat CT hematoma stable  2D Echo EF 55-60%  LDL 101  HgbA1c 6.4  Heparin subq for VTE prophylaxis Fall precautions  Diet heart healthy/carb modified Room service appropriate? Yes; Fluid consistency: Thin   clopidogrel 75 mg daily and Xarelto (rivaroxaban) daily prior to admission, now on No antithrombotic due to [place]. Will consider ASA 81mg  7 days post ICH if neuro stable (09/01/17), and repeat CT in 2-3 weeks, if ICH resolves, add eliquis to ASA 81mg .   Patient counseled to be compliant with his antithrombotic medications  Ongoing aggressive stroke risk factor management  Therapy recommendations:  CIR  Disposition:  Potential CIR on Monday   Wheezing with SOB, resolved  Mild wheezing and SOB  CXR no pulmonary edema  EKG afib, no ST-T changes  duoneb PRN  Lasix 40mg  once  Leukocytosis - WBC 7.4->10.3->11.7->8.9  Afib on Xarelto  Tele showed persistent AFib  Rate controlled, no RVR  Hold off AC for now.   Will repeat CT in 2-3 weeks, if ICH resolves, start eliquis  CAD/MI  On plavix PTA  Hold off for now due to [place]  Will consider ASA 81mg  7 days post ICH if neuro stable (09/01/17)  BA occlusion  Confirmed on  CTA and MRA  With distal constitution from collaterals  Consistent with chronic occlusion  Start ASA on  09/01/17 if neuro stable  BP goal 120-140, avoid low BP  Hypertension BP stable off Cleviprex On home norvasc, lisinopril and metoprolol BP goal < 140  Long term BP goal 120-140. Avoid hypotension due to BA occlusion, avoid hypertension due to anticoagulation use.  Need BP check closely at home  Hyperlipidemia  Home meds:  crestor 20   LDL 101, goal < 70  Now on crestor 20  Continue statin at discharge  Sundowning  May have underlying cognitive impairment  on Seroquel 25 nightly  Improved since on seroquel  Other Stroke Risk Factors  Hx stroke/TIA - imaging showed old left cerebellar infarct with calcification  Obstructive sleep apnea  Other Active Problems    Hospital day # 4  Rosalin Hawking, MD PhD Stroke Neurology 08/29/2017 2:52 PM   To contact Stroke Continuity provider, please refer to [email or web address] After hours, contact General Neurology

## 2017-08-30 ENCOUNTER — Other Ambulatory Visit: Payer: Self-pay

## 2017-08-30 ENCOUNTER — Encounter (HOSPITAL_COMMUNITY): Payer: Self-pay | Admitting: *Deleted

## 2017-08-30 DIAGNOSIS — I129 Hypertensive chronic kidney disease with stage 1 through stage 4 chronic kidney disease, or unspecified chronic kidney disease: Secondary | ICD-10-CM | POA: Diagnosis present

## 2017-08-30 DIAGNOSIS — Z7984 Long term (current) use of oral hypoglycemic drugs: Secondary | ICD-10-CM

## 2017-08-30 DIAGNOSIS — E785 Hyperlipidemia, unspecified: Secondary | ICD-10-CM | POA: Diagnosis present

## 2017-08-30 DIAGNOSIS — Z7901 Long term (current) use of anticoagulants: Secondary | ICD-10-CM

## 2017-08-30 DIAGNOSIS — E119 Type 2 diabetes mellitus without complications: Secondary | ICD-10-CM

## 2017-08-30 DIAGNOSIS — I252 Old myocardial infarction: Secondary | ICD-10-CM

## 2017-08-30 DIAGNOSIS — I610 Nontraumatic intracerebral hemorrhage in hemisphere, subcortical: Secondary | ICD-10-CM

## 2017-08-30 DIAGNOSIS — I4891 Unspecified atrial fibrillation: Secondary | ICD-10-CM | POA: Diagnosis present

## 2017-08-30 DIAGNOSIS — I69120 Aphasia following nontraumatic intracerebral hemorrhage: Secondary | ICD-10-CM

## 2017-08-30 DIAGNOSIS — N182 Chronic kidney disease, stage 2 (mild): Secondary | ICD-10-CM | POA: Diagnosis present

## 2017-08-30 DIAGNOSIS — G4733 Obstructive sleep apnea (adult) (pediatric): Secondary | ICD-10-CM

## 2017-08-30 DIAGNOSIS — F419 Anxiety disorder, unspecified: Secondary | ICD-10-CM | POA: Diagnosis present

## 2017-08-30 DIAGNOSIS — Z955 Presence of coronary angioplasty implant and graft: Secondary | ICD-10-CM

## 2017-08-30 DIAGNOSIS — R278 Other lack of coordination: Secondary | ICD-10-CM

## 2017-08-30 DIAGNOSIS — N400 Enlarged prostate without lower urinary tract symptoms: Secondary | ICD-10-CM | POA: Diagnosis present

## 2017-08-30 DIAGNOSIS — E1122 Type 2 diabetes mellitus with diabetic chronic kidney disease: Secondary | ICD-10-CM | POA: Diagnosis present

## 2017-08-30 DIAGNOSIS — I69151 Hemiplegia and hemiparesis following nontraumatic intracerebral hemorrhage affecting right dominant side: Principal | ICD-10-CM

## 2017-08-30 DIAGNOSIS — Z7902 Long term (current) use of antithrombotics/antiplatelets: Secondary | ICD-10-CM

## 2017-08-30 DIAGNOSIS — F17210 Nicotine dependence, cigarettes, uncomplicated: Secondary | ICD-10-CM | POA: Diagnosis present

## 2017-08-30 DIAGNOSIS — I10 Essential (primary) hypertension: Secondary | ICD-10-CM

## 2017-08-30 DIAGNOSIS — I2510 Atherosclerotic heart disease of native coronary artery without angina pectoris: Secondary | ICD-10-CM | POA: Diagnosis present

## 2017-08-30 LAB — GLUCOSE, CAPILLARY
GLUCOSE-CAPILLARY: 122 mg/dL — AB (ref 65–99)
GLUCOSE-CAPILLARY: 127 mg/dL — AB (ref 65–99)
GLUCOSE-CAPILLARY: 127 mg/dL — AB (ref 65–99)
Glucose-Capillary: 94 mg/dL (ref 65–99)

## 2017-08-30 NOTE — Clinical Social Work Note (Signed)
PASRR under review.  [place], [place]

## 2017-08-30 NOTE — Care Management Note (Signed)
Case Management Note  Patient Details  Name: Eugene [name] MRN: [id number] Date of Birth: [birth year]  Subjective/Objective:    Pt admitted with ICH. He is from home alone.                 Action/Plan: PT/OT recommending CIR. Family has decided on SNF in [place], [place]. CSW aware. CM following.  Expected Discharge Date:                  Expected Discharge Plan:  Skilled Nursing Facility  In-House Referral:  Clinical Social Work  Discharge planning Services     Post Acute Care Choice:    Choice offered to:     DME Arranged:    DME Agency:     HH Arranged:    [place] Agency:     Status of Service:  In process, will continue to follow  If discussed at Long Length of Stay Meetings, dates discussed:    Additional Comments:  Pollie Friar, RN 08/30/2017, 3:58 PM

## 2017-08-30 NOTE — Progress Notes (Addendum)
STROKE TEAM PROGRESS NOTE   SUBJECTIVE (INTERVAL HISTORY) His son Liliane Channel and daughter Jenny Reichmann are at bedside.  No new complaints. Ready for rehab when he has a bed. Discussed plan for change from xarelto to eliquis in 2-3 weeks.    OBJECTIVE Temp:  [97.7 F (36.5 C)-99 F (37.2 C)] 97.7 F (36.5 C) (04/15 1144) Pulse Rate:  [44-74] 74 (04/15 1144) Cardiac Rhythm: Atrial fibrillation (04/15 0700) Resp:  [16-21] 18 (04/15 1144) BP: (118-159)/(62-90) 159/90 (04/15 1144) SpO2:  [98 %-100 %] 99 % (04/15 1144)  Recent Labs  Lab 08/29/17 1233 08/29/17 1635 08/29/17 2250 08/30/17 0610 08/30/17 1141  GLUCAP 117* 122* 169* 122* 94    PHYSICAL EXAM General - Well nourished, well developed, easily arousable. Cardiovascular - irregularly irregular heart rate and rhythm.  Neuro - A&O x 3. Paucity of speech, able to follow simple commands, naming 2/4, able to repeat. Visual field test not cooperative.  Mild right nasolabial fold flattening.  Tongue midline.  Pronator drift on the right upper extremity, right lower extremity DF 4+/5.  Otherwise normal strength.  no Babinski.  Sensation symmetrical.  Coordination intact. Gait not tested   ASSESSMENT/PLAN Mr. JOHNIE MAKKI is a 77 y.o. 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 No tPA given due to [place].    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 Occlusion with distal constitution  CTA head and neck - stable hematoma, BA occlusion with distal recon, b/l VA stenosis L>R.   Repeat CT hematoma stable  2D Echo EF 55-60%  LDL 101  HgbA1c 6.4  Heparin subq for VTE prophylaxis Fall precautions  Diet  heart healthy/carb modified Room service appropriate? Yes; Fluid consistency: Thin   clopidogrel 75 mg daily and Xarelto (rivaroxaban) daily prior to admission, now on No antithrombotic due to [place]. consider ASA 81mg  7 days post ICH if neuro stable (09/01/17), and repeat CT in 2-3 weeks, if ICH resolves, add eliquis to ASA 81mg .   Patient counseled to be compliant with his antithrombotic medications  Ongoing aggressive stroke risk factor management  Therapy recommendations:  CIR  Disposition:  CIR when bed available  Wheezing with SOB, resolved  Mild wheezing and SOB  CXR no pulmonary edema  EKG afib, no ST-T changes  duoneb PRN  Lasix 40mg  once  Leukocytosis - WBC 7.4->10.3->11.7->8.9  Afib on Xarelto  Tele showed persistent AFib  Rate controlled, no RVR  Hold off AC for now.   Will repeat CT in 2-3 weeks, if ICH resolves, start eliquis  CAD/MI  On plavix PTA  Hold off for now due to [place]  consider ASA 81mg  7 days post ICH if neuro stable (09/01/17)  BA occlusion  Confirmed on CTA and MRA  With distal constitution from collaterals  Consistent with chronic occlusion  Start ASA on 09/01/17 if neuro stable  BP goal 120-140, avoid low BP  Hypertension BP stable off Cleviprex On home norvasc, lisinopril and metoprolol BP goal < 140  Long term BP goal 120-140. Avoid hypotension due to BA occlusion, avoid hypertension due to anticoagulation use.  Need BP check closely at home  Hyperlipidemia  Home  meds:  crestor 20   LDL 101, goal < 70  Now on crestor 20  Continue statin at discharge  Sundowning  May have underlying cognitive impairment  on Seroquel 25 nightly  Improved since on seroquel  Other Stroke Risk Factors  Hx stroke/TIA - imaging showed old left cerebellar infarct with calcification  Obstructive sleep apnea  Hospital day # [place], MSN, APRN, ANVP-BC, AGPCNP-BC Advanced Practice Stroke Nurse [place] for Schedule & Pager information 08/30/2017 1:51 PM  I have personally examined this patient, reviewed notes, independently viewed imaging studies, participated in medical decision making and plan of care.ROS completed by me personally and pertinent positives fully documented  I have made any additions or clarifications directly to the above note. Agree with note above. Come discussion with patient, son and daughter at the bedside and answered questions about his care. Likely transfer to rehabilitation when bed available. Family prefers nursing home if we have but is not available soon.Greater than 50% time during this 25 minute visit was spent on counseling and coordination of care about his intracerebral hemorrhage, atrial fibrillation and stroke risk and answering questions  Antony Contras, MD Medical Director [place] Pager: [phone] 08/30/2017 4:54 PM  To contact Stroke Continuity provider, please refer to [email or web address] After hours, contact General Neurology

## 2017-08-30 NOTE — Progress Notes (Signed)
Occupational Therapy Treatment Patient Details Name: CARRELL RAHMANI MRN: [id number] DOB: 1941-05-07 Today's Date: 08/30/2017    History of present illness pt is a 77 y/o male with pmh signifiance for HTN, afib admitted through [facility] ED with decreased sensation right side along with significant dysarthria.  CT showed a left thalamic bleed.  Per chart there is a component of difficulty with concentration, naming, comprehension and memory.  MRI showed the hemorrhage is stable with mild edema and regional shift.   OT comments  Pt currently at min assist level for standing balance during grooming tasks as well as short distance functional mobility to the sink and in the room with use of the RW.  Pt still with increased lean and LOB to the right as well as RUE coordination and strength impairments.  Recommend continued OT at CIR level in order to progress to supervision level for discharge home with family and hired assist as needed.  Will continue to follow in acute care.   Follow Up Recommendations  CIR;Supervision/Assistance - 24 hour    Equipment Recommendations  Other (comment)(TBD next venue of care)    Recommendations for Other Services Rehab consult    Precautions / Restrictions Precautions Precautions: Fall Restrictions Weight Bearing Restrictions: No       Mobility Bed Mobility Overal bed mobility: Needs Assistance Bed Mobility: Supine to Sit;Sit to Supine     Supine to sit: Supervision Sit to supine: Min assist      Transfers Overall transfer level: Needs assistance Equipment used: Rolling walker (2 wheeled) Transfers: Sit to/from Stand Sit to Stand: Min assist         General transfer comment: Pt needed mod instructional cueing for hand placement and sequencing for using the RW.    Balance Overall balance assessment: Needs assistance Sitting-balance support: Feet supported Sitting balance-Leahy Scale: Fair       Standing balance-Leahy Scale:  Poor Standing balance comment: Increased lean to the right in standing as well as forward trunk and cervical flexion.                            ADL either performed or assessed with clinical judgement   ADL Overall ADL's : Needs assistance/impaired     Grooming: Wash/dry hands;Oral care;Minimal assistance;Standing;Brushing hair                   Toilet Transfer: Ambulation;RW;Minimal assistance           Functional mobility during ADLs: Minimal assistance;Rolling walker General ADL Comments: Pt continues with balance deficits as well as disorientation.  He needs overall min assist for standing at the sink with grooming tasks as well as short distance mobility with use of the RW.  Increased trunk and cervical flexion noted in standing with increased lean to the right as well.  Able to use the RUE at a diminshed level with selfcare with decreased speed for gross and FM coordination.                Cognition Arousal/Alertness: Awake/alert Behavior During Therapy: WFL for tasks assessed/performed Overall Cognitive Status: Impaired/Different from baseline Area of Impairment: Safety/judgement;Awareness;Memory                 Orientation Level: Place;Time;Situation;Disoriented to Current Attention Level: Sustained Memory: Decreased short-term memory Following Commands: Follows one step commands consistently Safety/Judgement: Decreased awareness of safety;Decreased awareness of deficits Awareness: Intellectual;Anticipatory   General Comments: Pt initially unable  to state location or reason for hospitalization.  After 20 minute delay he was able to state place but not reason for being here.  Does report some balance deficits and RUE deficits with mod questioning cueing.               General Comments      Pertinent Vitals/ Pain       Pain Assessment: No/denies pain         Frequency  Min 3X/week        Progress Toward Goals  OT  Goals(current goals can now be found in the care plan section)  Progress towards OT goals: Progressing toward goals     Plan Discharge plan remains appropriate       AM-PAC PT "6 Clicks" Daily Activity     Outcome Measure                    End of Session Equipment Utilized During Treatment: Rolling walker  OT Visit Diagnosis: Unsteadiness on feet (R26.81);Muscle weakness (generalized) (M62.81);Cognitive communication deficit (R41.841);Hemiplegia and hemiparesis Hemiplegia - Right/Left: Right Hemiplegia - dominant/non-dominant: Dominant Hemiplegia - caused by: Nontraumatic intracerebral hemorrhage   Activity Tolerance Patient tolerated treatment well   Patient Left with call bell/phone within reach;with bed alarm set;in bed;with family/visitor present   Nurse Communication Mobility status        Time: 4696-2952 OT Time Calculation (min): 30 min  Charges: OT General Charges $OT Visit: 1 Visit OT Treatments $Self Care/Home Management : 23-37 mins   Kamarah Bilotta OTR/L 08/30/2017, 10:19 AM

## 2017-08-30 NOTE — Care Management Important Message (Signed)
Important Message  Patient Details  Name: Eugene [name] MRN: [id number] Date of Birth: [birth year]   Medicare Important Message Given:  Yes    Eddison Searls Montine Circle 08/30/2017, 1:14 PM

## 2017-08-30 NOTE — NC FL2 (Addendum)
[place] LEVEL OF CARE SCREENING TOOL     IDENTIFICATION  Patient Name: Eugene [name] Birthdate: [birth year] Sex: male Admission Date (Current Location): 08/25/2017  [facility] and [place] Number:  Herbalist and Address:  [facility] [place], [place] [address] [place], [place] [address]      Provider Number: [id number]  Attending Physician Name and Address:  Garvin Fila, MD  Relative Name and Phone Number:       Current Level of Care: Hospital Recommended Level of Care: [place] Prior Approval Number:    Date Approved/Denied:   PASRR Number:    Discharge Plan: SNF    Current Diagnoses: Patient Active Problem List   Diagnosis Date Noted  . Chronic anticoagulation   . Dysmetria   . Benign essential HTN   . Hyperlipidemia   . OSA (obstructive sleep apnea)   . 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 ICH (intracerebral hemorrhage) ([place]) 08/25/2017  . NSTEMI (non-ST elevated myocardial infarction) ([place])   . Diabetes mellitus type 2, noninsulin dependent ([place])   . Chest pain 06/24/2015  . Hyperglycemia 06/24/2015  . Pain in the chest   . Atrial fibrillation ([place]) 08/23/2009  . PLANTAR FASCIITIS, LEFT 12/31/2008  . TOBACCO ABUSE 04/10/2008  . REDNESS OR DISCHARGE OF EYE 03/09/2008  . RUQ PAIN 08/22/2007  . NEOPLASM, SKIN, UNCERTAIN BEHAVIOR 81/19/1478  . WEIGHT GAIN 03/17/2007  . SLEEP APNEA, OBSTRUCTIVE 09/22/2006  . MYOCARDIAL INFARCTION, HX OF 09/01/2006  . FATIGUE 09/01/2006  . HYPERLIPIDEMIA 06/22/2006  . ANXIETY 06/22/2006  . Depression 06/22/2006  . Hypertension 06/22/2006  . CAD (coronary artery disease) 06/22/2006  . BPH (benign prostatic hyperplasia) 06/22/2006    Orientation RESPIRATION BLADDER Height & Weight     Self, Place  Normal Incontinent Weight: 200 lb 13.4 oz (91.1 kg) Height:  6' (182.9 cm)  BEHAVIORAL SYMPTOMS/MOOD NEUROLOGICAL BOWEL NUTRITION STATUS      Continent  Diet(Heart healthy/carb modified, thin liquids)  AMBULATORY STATUS COMMUNICATION OF NEEDS Skin   Extensive Assist Verbally Normal                       Personal Care Assistance Level of Assistance  Bathing, Feeding, Dressing Bathing Assistance: Maximum assistance Feeding assistance: Independent Dressing Assistance: Maximum assistance     Functional Limitations Info  Sight, Hearing, Speech Sight Info: Adequate Hearing Info: Adequate Speech Info: Adequate    SPECIAL CARE FACTORS FREQUENCY  PT (By licensed PT), OT (By licensed OT)     PT Frequency: 3x OT Frequency: 3x            Contractures Contractures Info: Not present    Additional Factors Info  Code Status, Allergies Code Status Info: Partial Allergies Info: No known allergies           Current Medications (08/30/2017):  This is the current hospital active medication list Current Facility-Administered Medications  Medication Dose Route Frequency Provider Last Rate Last Dose  . acetaminophen (TYLENOL) tablet 650 mg  650 mg Oral Q4H PRN Kerney Elbe, MD      . ALPRAZolam Duanne Moron) tablet 0.5 mg  0.5 mg Oral TID PRN Kerney Elbe, MD   0.5 mg at 08/27/17 2106  . amLODipine (NORVASC) tablet 10 mg  10 mg Oral Daily Kerney Elbe, MD   10 mg at 08/30/17 1144  . finasteride (PROSCAR) tablet 5 mg  5 mg Oral Daily Kerney Elbe, MD  5 mg at 08/30/17 1140  . heparin injection 5,000 Units  5,000 Units Subcutaneous Q8H Rosalin Hawking, MD   5,000 Units at 08/30/17 1518  . hydrALAZINE (APRESOLINE) injection 5-10 mg  5-10 mg Intravenous Q4H PRN Rosalin Hawking, MD   5 mg at 08/30/17 0019  . insulin aspart (novoLOG) injection 0-15 Units  0-15 Units Subcutaneous TID WC Kerney Elbe, MD   2 Units at 08/30/17 [phone]  . insulin aspart (novoLOG) injection 0-5 Units  0-5 Units Subcutaneous QHS Kerney Elbe, MD      . ipratropium-albuterol (DUONEB) 0.5-2.5 (3) MG/3ML nebulizer solution 3 mL  3 mL Nebulization Q4H PRN Rosalin Hawking, MD   3 mL  at 08/28/17 1627  . lisinopril (PRINIVIL,ZESTRIL) tablet 20 mg  20 mg Oral Daily Kerney Elbe, MD   20 mg at 08/30/17 1141  . metoprolol tartrate (LOPRESSOR) tablet 12.5 mg  12.5 mg Oral BID Kerney Elbe, MD   12.5 mg at 08/30/17 1143  . pantoprazole (PROTONIX) EC tablet 40 mg  40 mg Oral Daily Rosalin Hawking, MD   40 mg at 08/30/17 1141  . PARoxetine (PAXIL) tablet 20 mg  20 mg Oral Daily Kerney Elbe, MD   20 mg at 08/30/17 1143  . QUEtiapine (SEROQUEL) tablet 25 mg  25 mg Oral QHS Rosalin Hawking, MD   25 mg at 08/29/17 2223  . rosuvastatin (CRESTOR) tablet 20 mg  20 mg Oral Daily Rosalin Hawking, MD   20 mg at 08/30/17 1141  . senna-docusate (Senokot-S) tablet 1 tablet  1 tablet Oral BID Kerney Elbe, MD   1 tablet at 08/30/17 1142     Discharge Medications: Please see discharge summary for a list of discharge medications.  Relevant Imaging Results:  Relevant Lab Results:   Additional Information SSN: [id number]  Eileen Stanford, LCSW   I have personally examined this patient, reviewed notes, independently viewed imaging studies, participated in medical decision making and plan of care.ROS completed by me personally and pertinent positives fully documented  I have made any additions or clarifications directly to the above note. Agree with note above.    Antony Contras, MD Medical Director [facility] Pager: [phone] 08/30/2017 8:55 PM

## 2017-08-30 NOTE — Progress Notes (Signed)
Physical Therapy Treatment Patient Details Name: Eugene [name] MRN: [id number] DOB: 04-29-1941 Today's Date: 08/30/2017    History of Present Illness pt is a 77 y/o male with pmh signifiance for HTN, afib admitted through [facility] ED with decreased sensation right side along with significant dysarthria.  CT showed a left thalamic bleed.  Per chart there is a component of difficulty with concentration, naming, comprehension and memory.  MRI showed the hemorrhage is stable with mild edema and regional shift.    PT Comments    Patient is making progress toward PT goals and tolerated session well. Pt required min A for sit to stand transfers and mod A (+2 for safety) for gait training. Pt with R lateral bias especially in standing. Continue to recommend CIR for further skilled PT services to maximize independence and safety with mobility.    Follow Up Recommendations  CIR     Equipment Recommendations  Rolling walker with 5" wheels    Recommendations for Other Services Rehab consult     Precautions / Restrictions Precautions Precautions: Fall    Mobility  Bed Mobility Overal bed mobility: Needs Assistance Bed Mobility: Supine to Sit     Supine to sit: Supervision     General bed mobility comments: supervision for safety  Transfers Overall transfer level: Needs assistance Equipment used: Rolling walker (2 wheeled) Transfers: Sit to/from Stand Sit to Stand: Min assist         General transfer comment: assistance to power up into standing from EOB and recliner; cues for hand placement to stand and for safe use of AD  Ambulation/Gait Ambulation/Gait assistance: Mod assist;+2 safety/equipment(chair follow) Ambulation Distance (Feet): 100 Feet Assistive device: Rolling walker (2 wheeled) Gait Pattern/deviations: Decreased stride length;Wide base of support;Decreased step length - right;Step-to pattern;Trunk flexed     General Gait Details: multimodal cues for  posture and R step length; R lateral lean; assistance required for balance and managing RW; vc for safe use of AD   Stairs             Wheelchair Mobility    Modified Rankin (Stroke Patients Only) Modified Rankin (Stroke Patients Only) Pre-Morbid Rankin Score: No symptoms Modified Rankin: Moderately severe disability     Balance Overall balance assessment: Needs assistance Sitting-balance support: Feet supported Sitting balance-Leahy Scale: Fair     Standing balance support: Bilateral upper extremity supported Standing balance-Leahy Scale: Poor Standing balance comment: R lateral lean                            Cognition Arousal/Alertness: Awake/alert Behavior During Therapy: WFL for tasks assessed/performed Overall Cognitive Status: Impaired/Different from baseline Area of Impairment: Safety/judgement;Awareness;Memory                 Orientation Level: Situation;Disoriented to Current Attention Level: Sustained Memory: Decreased short-term memory Following Commands: Follows one step commands consistently Safety/Judgement: Decreased awareness of safety;Decreased awareness of deficits Awareness: Intellectual   General Comments: word finding difficulties      Exercises      General Comments        Pertinent Vitals/Pain Pain Assessment: No/denies pain    Home Living                      Prior Function            PT Goals (current goals can now be found in the care plan section) Acute Rehab PT Goals  Patient Stated Goal: none stated PT Goal Formulation: With patient/family Time For Goal Achievement: 09/09/17 Potential to Achieve Goals: Good Progress towards PT goals: Progressing toward goals    Frequency    Min 3X/week      PT Plan Current plan remains appropriate    Co-evaluation              AM-PAC PT "6 Clicks" Daily Activity  Outcome Measure  Difficulty turning over in bed (including adjusting  bedclothes, sheets and blankets)?: A Lot Difficulty moving from lying on back to sitting on the side of the bed? : Unable Difficulty sitting down on and standing up from a chair with arms (e.g., wheelchair, bedside commode, etc,.)?: Unable Help needed moving to and from a bed to chair (including a wheelchair)?: A Lot Help needed walking in hospital room?: A Lot Help needed climbing 3-5 steps with a railing? 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

## 2017-08-30 NOTE — Clinical Social Work Note (Signed)
Clinical Social Work Assessment  Patient Details  Name: Eugene [name] MRN: [id number] Date of Birth: 08/03/1940  Date of referral:  08/30/17               Reason for consult:  Facility Placement                Permission sought to share information with:  Family Supports Permission granted to share information::     Name::     Dorian Pod  Agency::  [facility]  Relationship::  Sister  Contact Information:  [phone]  Housing/Transportation Living arrangements for the past 2 months:  Single Family Home Source of Information:  Adult Children Patient Interpreter Needed:  None Criminal Activity/Legal Involvement Pertinent to Current Situation/Hospitalization:  No - Comment as needed Significant Relationships:  Adult Children, Siblings Lives with:  Self Do you feel safe going back to the place where you live?  No Need for family participation in patient care:  No (Coment)  Care giving concerns:  Pt is alert to self and place. Pt's daughter and son present at bedside.    Social Worker assessment / plan:  CSW spoke with pt's family at bedside--per request of family. The recommendation is for pt to go to CIR however, family prefers that pt go to SNF--[facility] in [place]. CSW to follow up with facility and if able, start auth. MD notified.   Employment status:  Retired Nurse, adult PT Recommendations:  Inpatient Tangent / Referral to community resources:  [place]  Patient/Family's Response to care:  Pt's family verbalized understanding of CSW role and expressed appreciation for support. Pt's family denies any concern regarding pt care at this time.   Patient/Family's Understanding of and Emotional Response to Diagnosis, Current Treatment, and Prognosis:  Pt's family understanding and realistic regarding pt's physical limitations. Pt's family prefers pt go to SNF at d/c.  Pt's family denies any concern regarding pt's  treatment plan at this time. CSW will continue to provide support and facilitate d/c needs.   Emotional Assessment Appearance:  Appears stated age Attitude/Demeanor/Rapport:  Unable to Assess Affect (typically observed):  Unable to Assess Orientation:  Oriented to Self, Oriented to Place Alcohol / Substance use:  Not Applicable Psych involvement (Current and /or in the community):  No (Comment)  Discharge Needs  Concerns to be addressed:  Care Coordination, Basic Needs Readmission within the last 30 days:  No Current discharge risk:  Dependent with Mobility Barriers to Discharge:  Continued Medical Work up   [employer], LCSW 08/30/2017, 3:34 PM

## 2017-08-30 NOTE — Progress Notes (Signed)
Rehab admissions - I met with patient, his son and his daughter.  I gave them rehab booklets and explained inpatient rehab.  I explained copays for coming to inpatient rehab.  They have decided to go to a SNF in [place].  I did have authorization for CIR, but family want to go to a SNF.  They believe that they will have more rehab time in a SNF.  Case manager and social worker are aware of decision.  Call me for questions.  #[phone]

## 2017-08-30 NOTE — Clinical Social Work Note (Addendum)
Pt's family was requesting to speak with CSW. CSW met with pt's family at bedside. Pt's family has decided they would prefer pt go to a SNF in [place] instead of CIR. Pt's family requesting CSW look into [facility] in [place]. CIR updated. CSW to follow up.   [place], [place]

## 2017-08-31 ENCOUNTER — Other Ambulatory Visit: Payer: Self-pay

## 2017-08-31 ENCOUNTER — Encounter (HOSPITAL_COMMUNITY): Payer: Self-pay

## 2017-08-31 ENCOUNTER — Inpatient Hospital Stay (HOSPITAL_COMMUNITY)
Admission: RE | Admit: 2017-08-31 | Discharge: 2017-09-11 | DRG: 057 | Disposition: A | Discharge status: Home / Self Care | Payer: Medicare Other | Source: Intra-hospital | Attending: Physical Medicine & Rehabilitation | Admitting: Physical Medicine & Rehabilitation

## 2017-08-31 ENCOUNTER — Encounter (HOSPITAL_COMMUNITY): Payer: Self-pay | Admitting: General Practice

## 2017-08-31 DIAGNOSIS — E1122 Type 2 diabetes mellitus with diabetic chronic kidney disease: Secondary | ICD-10-CM | POA: Diagnosis not present

## 2017-08-31 DIAGNOSIS — I129 Hypertensive chronic kidney disease with stage 1 through stage 4 chronic kidney disease, or unspecified chronic kidney disease: Secondary | ICD-10-CM | POA: Diagnosis not present

## 2017-08-31 DIAGNOSIS — I69120 Aphasia following nontraumatic intracerebral hemorrhage: Secondary | ICD-10-CM | POA: Diagnosis not present

## 2017-08-31 DIAGNOSIS — F05 Delirium due to known physiological condition: Secondary | ICD-10-CM | POA: Diagnosis present

## 2017-08-31 DIAGNOSIS — Z7984 Long term (current) use of oral hypoglycemic drugs: Secondary | ICD-10-CM | POA: Diagnosis not present

## 2017-08-31 DIAGNOSIS — I619 Nontraumatic intracerebral hemorrhage, unspecified: Secondary | ICD-10-CM | POA: Diagnosis not present

## 2017-08-31 DIAGNOSIS — I252 Old myocardial infarction: Secondary | ICD-10-CM | POA: Diagnosis not present

## 2017-08-31 DIAGNOSIS — I651 Occlusion and stenosis of basilar artery: Secondary | ICD-10-CM | POA: Diagnosis present

## 2017-08-31 DIAGNOSIS — N319 Neuromuscular dysfunction of bladder, unspecified: Secondary | ICD-10-CM | POA: Diagnosis not present

## 2017-08-31 DIAGNOSIS — Z8673 Personal history of transient ischemic attack (TIA), and cerebral infarction without residual deficits: Secondary | ICD-10-CM

## 2017-08-31 DIAGNOSIS — I610 Nontraumatic intracerebral hemorrhage in hemisphere, subcortical: Secondary | ICD-10-CM | POA: Diagnosis not present

## 2017-08-31 DIAGNOSIS — G8191 Hemiplegia, unspecified affecting right dominant side: Secondary | ICD-10-CM | POA: Diagnosis not present

## 2017-08-31 DIAGNOSIS — I10 Essential (primary) hypertension: Secondary | ICD-10-CM

## 2017-08-31 DIAGNOSIS — F419 Anxiety disorder, unspecified: Secondary | ICD-10-CM | POA: Diagnosis not present

## 2017-08-31 DIAGNOSIS — I161 Hypertensive emergency: Secondary | ICD-10-CM | POA: Diagnosis present

## 2017-08-31 DIAGNOSIS — Z955 Presence of coronary angioplasty implant and graft: Secondary | ICD-10-CM | POA: Diagnosis not present

## 2017-08-31 DIAGNOSIS — I69320 Aphasia following cerebral infarction: Secondary | ICD-10-CM

## 2017-08-31 DIAGNOSIS — I481 Persistent atrial fibrillation: Secondary | ICD-10-CM | POA: Diagnosis not present

## 2017-08-31 DIAGNOSIS — I69359 Hemiplegia and hemiparesis following cerebral infarction affecting unspecified side: Secondary | ICD-10-CM | POA: Diagnosis not present

## 2017-08-31 DIAGNOSIS — I4891 Unspecified atrial fibrillation: Secondary | ICD-10-CM | POA: Diagnosis not present

## 2017-08-31 DIAGNOSIS — E785 Hyperlipidemia, unspecified: Secondary | ICD-10-CM | POA: Diagnosis not present

## 2017-08-31 DIAGNOSIS — I69151 Hemiplegia and hemiparesis following nontraumatic intracerebral hemorrhage affecting right dominant side: Secondary | ICD-10-CM | POA: Diagnosis not present

## 2017-08-31 DIAGNOSIS — N179 Acute kidney failure, unspecified: Secondary | ICD-10-CM

## 2017-08-31 DIAGNOSIS — I2510 Atherosclerotic heart disease of native coronary artery without angina pectoris: Secondary | ICD-10-CM | POA: Diagnosis not present

## 2017-08-31 DIAGNOSIS — G4733 Obstructive sleep apnea (adult) (pediatric): Secondary | ICD-10-CM | POA: Diagnosis not present

## 2017-08-31 DIAGNOSIS — R7303 Prediabetes: Secondary | ICD-10-CM

## 2017-08-31 DIAGNOSIS — F17210 Nicotine dependence, cigarettes, uncomplicated: Secondary | ICD-10-CM | POA: Diagnosis not present

## 2017-08-31 DIAGNOSIS — Z7901 Long term (current) use of anticoagulants: Secondary | ICD-10-CM | POA: Diagnosis not present

## 2017-08-31 DIAGNOSIS — N182 Chronic kidney disease, stage 2 (mild): Secondary | ICD-10-CM

## 2017-08-31 DIAGNOSIS — N400 Enlarged prostate without lower urinary tract symptoms: Secondary | ICD-10-CM | POA: Diagnosis not present

## 2017-08-31 DIAGNOSIS — Z7902 Long term (current) use of antithrombotics/antiplatelets: Secondary | ICD-10-CM | POA: Diagnosis not present

## 2017-08-31 LAB — GLUCOSE, CAPILLARY
GLUCOSE-CAPILLARY: 98 mg/dL (ref 65–99)
GLUCOSE-CAPILLARY: 91 mg/dL (ref 65–99)
Glucose-Capillary: 134 mg/dL — ABNORMAL HIGH (ref 65–99)
Glucose-Capillary: 107 mg/dL — ABNORMAL HIGH (ref 65–99)

## 2017-08-31 LAB — CBC
HEMATOCRIT: 46.3 % (ref 39.0–52.0)
Hemoglobin: 15.5 g/dL (ref 13.0–17.0)
MCH: 30.3 pg (ref 26.0–34.0)
MCHC: 33.5 g/dL (ref 30.0–36.0)
MCV: 90.6 fL (ref 78.0–100.0)
PLATELETS: 220 10*3/uL (ref 150–400)
RBC: 5.11 MIL/uL (ref 4.22–5.81)
RDW: 14.4 % (ref 11.5–15.5)
WBC: 10.9 10*3/uL — AB (ref 4.0–10.5)

## 2017-08-31 LAB — CREATININE, SERUM
Creatinine, Ser: 1.33 mg/dL — ABNORMAL HIGH (ref 0.61–1.24)
GFR calc non Af Amer: 50 mL/min — ABNORMAL LOW (ref >60)
GFR, EST AFRICAN AMERICAN: 57 mL/min — AB (ref >60)

## 2017-08-31 MED ORDER — QUETIAPINE FUMARATE 25 MG PO TABS
25.0000 mg | ORAL_TABLET | Freq: Every day | ORAL | Status: DC
  Administered 2017-08-31 – 2017-09-10 (×11): 25 mg via ORAL
  Filled 2017-08-31 (×11): qty 1

## 2017-08-31 MED ORDER — ACETAMINOPHEN 325 MG PO TABS: 650.0000 mg | ORAL_TABLET | ORAL | Status: DC | PRN

## 2017-08-31 MED ORDER — AMLODIPINE BESYLATE 10 MG PO TABS
10.0000 mg | ORAL_TABLET | Freq: Every day | ORAL | Status: DC
  Administered 2017-09-01 – 2017-09-11 (×11): 10 mg via ORAL
  Filled 2017-08-31 (×11): qty 1

## 2017-08-31 MED ORDER — HEPARIN SODIUM (PORCINE) 5000 UNIT/ML IJ SOLN: 5000.0000 [IU] | Freq: Three times a day (TID) | INTRAMUSCULAR | Status: DC

## 2017-08-31 MED ORDER — FINASTERIDE 5 MG PO TABS
5.0000 mg | ORAL_TABLET | Freq: Every day | ORAL | Status: DC
  Administered 2017-09-01 – 2017-09-11 (×11): 5 mg via ORAL
  Filled 2017-08-31 (×11): qty 1

## 2017-08-31 MED ORDER — SENNOSIDES-DOCUSATE SODIUM 8.6-50 MG PO TABS
1.0000 | ORAL_TABLET | Freq: Two times a day (BID) | ORAL | Status: DC
  Administered 2017-08-31 – 2017-09-10 (×21): 1 via ORAL
  Filled 2017-08-31 (×21): qty 1

## 2017-08-31 MED ORDER — ONDANSETRON HCL 4 MG/2ML IJ SOLN: 4.0000 mg | Freq: Four times a day (QID) | INTRAMUSCULAR | Status: DC | PRN

## 2017-08-31 MED ORDER — ASPIRIN EC 81 MG PO TBEC: 81.0000 mg | DELAYED_RELEASE_TABLET | Freq: Every day | ORAL | Status: DC

## 2017-08-31 MED ORDER — ALPRAZOLAM 0.5 MG PO TABS: 0.5000 mg | ORAL_TABLET | Freq: Three times a day (TID) | ORAL | Status: DC | PRN

## 2017-08-31 MED ORDER — HEPARIN SODIUM (PORCINE) 5000 UNIT/ML IJ SOLN
5000.0000 [IU] | Freq: Three times a day (TID) | INTRAMUSCULAR | Status: DC
  Administered 2017-08-31 – 2017-09-10 (×30): 5000 [IU] via SUBCUTANEOUS
  Filled 2017-08-31 (×30): qty 1

## 2017-08-31 MED ORDER — PAROXETINE HCL 20 MG PO TABS
20.0000 mg | ORAL_TABLET | Freq: Every day | ORAL | Status: DC
  Administered 2017-09-01 – 2017-09-11 (×11): 20 mg via ORAL
  Filled 2017-08-31 (×11): qty 1

## 2017-08-31 MED ORDER — INSULIN ASPART 100 UNIT/ML ~~LOC~~ SOLN: 0.0000 [IU] | Freq: Every day | SUBCUTANEOUS | 11 refills | Status: DC

## 2017-08-31 MED ORDER — SENNOSIDES-DOCUSATE SODIUM 8.6-50 MG PO TABS: 1.0000 | ORAL_TABLET | Freq: Two times a day (BID) | ORAL | Status: DC

## 2017-08-31 MED ORDER — ONDANSETRON HCL 4 MG PO TABS: 4.0000 mg | ORAL_TABLET | Freq: Four times a day (QID) | ORAL | Status: DC | PRN

## 2017-08-31 MED ORDER — IPRATROPIUM-ALBUTEROL 0.5-2.5 (3) MG/3ML IN SOLN: 3.0000 mL | RESPIRATORY_TRACT | Status: DC | PRN

## 2017-08-31 MED ORDER — METOPROLOL TARTRATE 25 MG PO TABS: 12.5000 mg | ORAL_TABLET | Freq: Two times a day (BID) | ORAL | Status: DC

## 2017-08-31 MED ORDER — INSULIN ASPART 100 UNIT/ML ~~LOC~~ SOLN
0.0000 [IU] | Freq: Three times a day (TID) | SUBCUTANEOUS | Status: DC
  Administered 2017-09-04 – 2017-09-11 (×3): 2 [IU] via SUBCUTANEOUS

## 2017-08-31 MED ORDER — INSULIN ASPART 100 UNIT/ML ~~LOC~~ SOLN: 0.0000 [IU] | Freq: Three times a day (TID) | SUBCUTANEOUS | 11 refills | Status: DC

## 2017-08-31 MED ORDER — ASPIRIN 81 MG PO TBEC: 81.0000 mg | DELAYED_RELEASE_TABLET | Freq: Every day | ORAL | Status: DC

## 2017-08-31 MED ORDER — PANTOPRAZOLE SODIUM 40 MG PO TBEC: 40.0000 mg | DELAYED_RELEASE_TABLET | Freq: Every day | ORAL | Status: DC

## 2017-08-31 MED ORDER — PANTOPRAZOLE SODIUM 40 MG PO TBEC
40.0000 mg | DELAYED_RELEASE_TABLET | Freq: Every day | ORAL | Status: DC
  Administered 2017-09-01 – 2017-09-11 (×11): 40 mg via ORAL
  Filled 2017-08-31 (×11): qty 1

## 2017-08-31 MED ORDER — ROSUVASTATIN CALCIUM 10 MG PO TABS
20.0000 mg | ORAL_TABLET | Freq: Every day | ORAL | Status: DC
Start: 2017-09-01 — End: 2017-09-11
  Administered 2017-09-01 – 2017-09-11 (×11): 20 mg via ORAL
  Filled 2017-08-31 (×11): qty 2

## 2017-08-31 MED ORDER — QUETIAPINE FUMARATE 25 MG PO TABS: 25.0000 mg | ORAL_TABLET | Freq: Every day | ORAL | Status: DC

## 2017-08-31 MED ORDER — LISINOPRIL 20 MG PO TABS: 20.0000 mg | ORAL_TABLET | Freq: Every day | ORAL | Status: DC

## 2017-08-31 MED ORDER — SORBITOL 70 % SOLN: 30.0000 mL | Freq: Every day | Status: DC | PRN

## 2017-08-31 MED ORDER — LISINOPRIL 20 MG PO TABS
20.0000 mg | ORAL_TABLET | Freq: Every day | ORAL | Status: DC
Start: 2017-09-01 — End: 2017-09-11
  Administered 2017-09-01 – 2017-09-11 (×11): 20 mg via ORAL
  Filled 2017-08-31 (×11): qty 1

## 2017-08-31 MED ORDER — ASPIRIN 81 MG PO CHEW
81.0000 mg | CHEWABLE_TABLET | Freq: Every day | ORAL | Status: DC
  Administered 2017-09-01 – 2017-09-11 (×11): 81 mg via ORAL
  Filled 2017-08-31 (×11): qty 1

## 2017-08-31 MED ORDER — METOPROLOL TARTRATE 12.5 MG HALF TABLET
12.5000 mg | ORAL_TABLET | Freq: Two times a day (BID) | ORAL | Status: DC
  Administered 2017-08-31 – 2017-09-04 (×8): 12.5 mg via ORAL
  Filled 2017-08-31 (×8): qty 1

## 2017-08-31 NOTE — H&P (Signed)
Physical Medicine and Rehabilitation Admission H&P    Chief Complaint  Patient presents with  . Code Stroke  : HPI:  Eugene [name] is a 77 y.o. right-handed male with history of prediabetes, hypertension, hyperlipidemia, tobacco abuse and atrial fibrillation maintained on Xarelto and question medical compliance.  Per chart review patient lives alone independent and driving as well as active.  One level home.  Presented 08/25/2017 with right-sided weakness and aphasia.  Blood pressure 157/98.  CT the head showed oval left thalamic hemorrhage with an estimated blood volume of 8 mL.  Mild surrounding edema and regional mass-effect.  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  Neurology follow-up currently plan is for aspirin 81 mg in 5-7 days of neuro stable post ICH with plans to repeat CT in 2-3 weeks.  If ICH resolves plan was to add Eliquis to aspirin.  Patient presently on subcutaneous heparin since 08/28/2017 Echocardiogram with ejection fraction of 60% no wall motion abnormalities. Physical and Occupational therapy evaluations completed with recommendations of physical medicine rehab consult.patient was admitted for a comprehensive rehab program   Review of Systems  Constitutional: Negative for chills and fever.  HENT: Negative for hearing loss.   Eyes: Negative for blurred vision and double vision.  Respiratory: Negative for shortness of breath.   Cardiovascular: Positive for palpitations and leg swelling. Negative for chest pain.  Gastrointestinal: Positive for constipation. Negative for nausea and vomiting.  Genitourinary: Positive for  urgency. Negative for flank pain and hematuria.  Musculoskeletal: Positive for myalgias.  Psychiatric/Behavioral: Positive for depression.       Anxiety  All other systems reviewed and are negative.  Past Medical History:  Diagnosis Date  . Anxiety   . Atrial fibrillation ([place])   . BPH (benign prostatic hypertrophy)   . Cataracts, both eyes   . Coronary artery disease   . Depressed   . Hyperlipidemia   . Hypertension   . ICH (intracerebral hemorrhage) ([place]) 08/2017  . MI (myocardial infarction) ([place])   . OSA (obstructive sleep apnea)   . Pre-diabetes    Past Surgical History:  Procedure Laterality Date  . CARDIAC CATHETERIZATION N/A 06/26/2015   Procedure: Left Heart Cath and Coronary Angiography;  Surgeon: Burnell Blanks, MD;  Location: [facility];  Service: Cardiovascular;  Laterality: N/A;  . CARDIAC CATHETERIZATION  06/26/2015   Procedure: Coronary Stent Intervention;  Surgeon: Burnell Blanks, MD;  Location: [facility];  Service: Cardiovascular;;  . CARDIAC CATHETERIZATION  06/26/2015   Procedure: Coronary Balloon Angioplasty;  Surgeon: Burnell Blanks, MD;  Location: [facility];  Service: Cardiovascular;;  . CORONARY STENT PLACEMENT  10/2004   Family History  Problem Relation Age of Onset  . Sleep apnea Son    Social History:  reports that he has been smoking.  He has a 50.00 pack-year smoking history. He has never used smokeless tobacco. He reports that he does not drink alcohol or use drugs. Allergies: No Known Allergies Medications Prior to Admission  Medication Sig Dispense Refill  . ALPRAZolam (XANAX) 0.5 MG tablet Take 1 tablet (0.5 mg total) by mouth 3 (three) times daily as needed  for anxiety. 60 tablet 0  . amLODipine (NORVASC) 10 MG tablet TAKE 1 TABLET BY MOUTH DAILY 90 tablet 3  . clopidogrel (PLAVIX) 75 MG tablet Take 75 mg by mouth daily.    . finasteride (PROSCAR) 5 MG tablet TAKE 1 BY MOUTH DAILY 90 tablet 3  .  lisinopril (PRINIVIL,ZESTRIL) 20 MG tablet TAKE 1 TABLET(20 MG) BY MOUTH DAILY 90 tablet 0  . metFORMIN (GLUCOPHAGE) 500 MG tablet TAKE 1 TABLET BY MOUTH TWICE DAILY WITH MEALS 180 tablet 0  . nitroGLYCERIN (NITROSTAT) 0.4 MG SL tablet ONE TABLET UNDER TONGUE AS NEEDED FOR CHEST PAIN EVERY 5 MINUTES- TAKE A MAX OF 3 TABLETS BEFORE ER VISIT 25 tablet 0  . rivaroxaban (XARELTO) 20 MG TABS tablet Take 20 mg by mouth daily with supper.    . metoprolol tartrate (LOPRESSOR) 25 MG tablet Take 0.5 tablets (12.5 mg total) by mouth 2 (two) times daily. Reported on 10/01/2015 (Patient not taking: Reported on 08/26/2017) 60 tablet 5  . PARoxetine (PAXIL) 20 MG tablet TAKE 1 TABLET BY MOUTH DAILY (Patient not taking: Reported on 08/26/2017) 90 tablet 3  . rosuvastatin (CRESTOR) 20 MG tablet TAKE 1 BY MOUTH DAILY NO FURTHER REFILLS WITHOUT FOLLOW UP LAB WORK (Patient not taking: Reported on 08/26/2017) 90 tablet 0    Drug Regimen Review Drug regimen was reviewed and remains appropriate without significant issues identified  Home: Home Living Family/patient expects to be discharged to:: Private residence Living Arrangements: Alone Available Help at Discharge: Family, Available 24 hours/day Type of Home: House Home Access: Stairs to enter [place]: One level Bathroom Shower/Tub: Public librarian, Charity fundraiser: Standard Home Equipment: None   Functional History: Prior Function Level of Independence: Independent Comments: drives, active, does yard work  Functional Status:  Mobility: Bed Mobility Overal bed mobility: Needs Assistance Bed Mobility: Supine to Sit Supine to sit: Supervision Sit to supine: Min assist General bed mobility comments: supervision for safety Transfers Overall transfer level: Needs assistance Equipment used: Rolling walker (2 wheeled) Transfers: Sit to/from Stand Sit to Stand: Min assist General transfer comment: assistance to power up into standing from EOB and  recliner; cues for hand placement to stand and for safe use of AD Ambulation/Gait Ambulation/Gait assistance: Mod assist, +2 safety/equipment(chair follow) Ambulation Distance (Feet): 100 Feet Assistive device: Rolling walker (2 wheeled) Gait Pattern/deviations: Decreased stride length, Wide base of support, Decreased step length - right, Step-to pattern, Trunk flexed General Gait Details: multimodal cues for posture and R step length; R lateral lean; assistance required for balance and managing RW; vc for safe use of AD Gait velocity interpretation: <1.31 ft/sec, indicative of household ambulator    ADL: ADL Overall ADL's : Needs assistance/impaired Eating/Feeding: Minimal assistance, Sitting Grooming: Wash/dry hands, Oral care, Minimal assistance, Standing, Brushing hair Upper Body Bathing: Moderate assistance, Sitting Lower Body Bathing: Maximal assistance, Sit to/from stand Upper Body Dressing : Moderate assistance, Sitting Lower Body Dressing: Maximal assistance, Sit to/from stand Lower Body Dressing Details (indicate cue type and reason): Significant difficulty with processing and sequencing of task in addition to balance deficits and RUE coordination deficits Toilet Transfer: Ambulation, RW, Minimal assistance Toilet Transfer Details (indicate cue type and reason): Simulated by sit to stand from EOB with functional mobility; pt fatigues with functional tasks Functional mobility during ADLs: Minimal assistance, Rolling walker General ADL Comments: Pt continues with balance deficits as well as disorientation.  He needs overall min assist for standing at the sink with grooming tasks as well as short  distance mobility with use of the RW.  Increased trunk and cervical flexion noted in standing with increased lean to the right as well.  Able to use the RUE at a diminshed level with selfcare with decreased speed for gross and FM coordination.   Cognition: Cognition Overall Cognitive  Status: Impaired/Different from baseline Orientation Level: Oriented to person, Oriented to place, Disoriented to time, Disoriented to situation Cognition Arousal/Alertness: Awake/alert Behavior During Therapy: WFL for tasks assessed/performed Overall Cognitive Status: Impaired/Different from baseline Area of Impairment: Safety/judgement, Awareness, Memory Orientation Level: Situation, Disoriented to Current Attention Level: Sustained Memory: Decreased short-term memory Following Commands: Follows one step commands consistently Safety/Judgement: Decreased awareness of safety, Decreased awareness of deficits Awareness: Intellectual Problem Solving: Slow processing, Decreased initiation, Difficulty sequencing, Requires verbal cues, Requires tactile cues General Comments: word finding difficulties  Physical Exam: Blood pressure (!) 142/99, pulse 67, temperature 98.3 F (36.8 C), temperature source Oral, resp. rate 17, height 6' (1.829 m), weight 91.1 kg (200 lb 13.4 oz), SpO2 97 %. Physical Exam  Vitals reviewed. Constitutional: He is oriented to person, place, and time.  HENT:  Head: Normocephalic.  Eyes: EOM are normal. Right eye exhibits no discharge.  Neck: Normal range of motion. Neck supple. No thyromegaly present.  Cardiovascular: Regular rhythm. Exam reveals no gallop and no friction rub.  No murmur heard. Cardiac rate controlled  Respiratory: Effort normal and breath sounds normal. No respiratory distress. He has no wheezes.  GI: Soft. Bowel sounds are normal. He exhibits no distension. There is no tenderness. There is no rebound.  Musculoskeletal: Normal range of motion.  Neurological: He is alert and oriented to person, place, and time.  Pt follows simple commands. Word finding deficits, non-fluent language. RUE 4 to 4+/5 with PD and decreased [place]. LUE 4+/5. RLE 4/5, LLE 4+/5. Mild sensory loss right arm and leg.   Skin: Skin is warm and dry.    Results for orders placed  or performed during the hospital encounter of 08/25/17 (from the past 48 hour(s))  Glucose, capillary     Status: Abnormal   Collection Time: 08/29/17 12:33 PM  Result Value Ref Range   Glucose-Capillary 117 (H) 65 - 99 mg/dL   Comment 1 Notify RN    Comment 2 Document in Chart   Glucose, capillary     Status: Abnormal   Collection Time: 08/29/17  4:35 PM  Result Value Ref Range   Glucose-Capillary 122 (H) 65 - 99 mg/dL   Comment 1 Notify RN    Comment 2 Document in Chart   Glucose, capillary     Status: Abnormal   Collection Time: 08/29/17 10:50 PM  Result Value Ref Range   Glucose-Capillary 169 (H) 65 - 99 mg/dL   Comment 1 Notify RN   Glucose, capillary     Status: Abnormal   Collection Time: 08/30/17  6:10 AM  Result Value Ref Range   Glucose-Capillary 122 (H) 65 - 99 mg/dL   Comment 1 Notify RN   Glucose, capillary     Status: None   Collection Time: 08/30/17 11:41 AM  Result Value Ref Range   Glucose-Capillary 94 65 - 99 mg/dL  Glucose, capillary     Status: Abnormal   Collection Time: 08/30/17  4:47 PM  Result Value Ref Range   Glucose-Capillary 127 (H) 65 - 99 mg/dL  Glucose, capillary     Status: Abnormal   Collection Time: 08/30/17  9:36 PM  Result Value Ref Range   Glucose-Capillary 127 (H)  65 - 99 mg/dL   Comment 1 Notify RN    Comment 2 Document in Chart   Glucose, capillary     Status: None   Collection Time: 08/31/17  6:31 AM  Result Value Ref Range   Glucose-Capillary 98 65 - 99 mg/dL   Comment 1 Notify RN    Comment 2 Document in Chart   Glucose, capillary     Status: Abnormal   Collection Time: 08/31/17 11:22 AM  Result Value Ref Range   Glucose-Capillary 134 (H) 65 - 99 mg/dL   Comment 1 Notify RN    Comment 2 Document in Chart    No results found.     Medical Problem List and Plan: 1.  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  Plan follow-up CT 2-3 weeks if ICH resolves add Eliquis to aspirin secondary to history of  atrial fibrillation  -admit to inpatient rehab 2.  DVT Prophylaxis/Anticoagulation: Subcutaneous heparin initiated 08/28/2017 3. Pain Management: Tylenol as needed 4. Mood: Paxil 20 mg daily, Seroquel 25 mg nightly Xanax 0.5 mg 3 times daily as needed 5. Neuropsych: This patient is capable of making decisions on his own behalf. 6. Skin/Wound Care: Routine skin checks 7. Fluids/Electrolytes/Nutrition: Routine I&O's with follow-up chemistries upon admit 8.  Atrial fibrillation.  Aspirin to be initiated 81 mg 09/01/2017.  Cardiac rate controlled 9.  Hypertension.  Norvasc 10 mg daily, lisinopril 20 mg daily, Lopressor 12.5 mg twice daily 10.  Prediabetic.  SSI.  Check blood sugars before meals and at bedtime 11.  BPH.  Proscar 5 mg daily.  Check PVR x3 12.  Hyperlipidemia.  Crestor  Post Admission Physician Evaluation: 1. Functional deficits secondary  to left thalamic hemorrhage. 2. Patient is admitted to receive collaborative, interdisciplinary care between the physiatrist, rehab nursing staff, and therapy team. 3. Patient's level of medical complexity and substantial therapy needs in context of that medical necessity cannot be provided at a lesser intensity of care such as a SNF. 4. Patient has experienced substantial functional loss from his/her baseline which was documented above under the "Functional History" and "Functional Status" headings.  Judging by the patient's diagnosis, physical exam, and functional history, the patient has potential for functional progress which will result in measurable gains while on inpatient rehab.  These gains will be of substantial and practical use upon discharge  in facilitating mobility and self-care at the household level. 5. Physiatrist will provide 24 hour management of medical needs as well as oversight of the therapy plan/treatment and provide guidance as appropriate regarding the interaction of the two. 6. The Preadmission Screening has been reviewed and  patient status is unchanged unless otherwise stated above. 7. 24 hour rehab nursing will assist with bladder management, bowel management, safety, skin/wound care, disease management, medication administration, pain management and patient education  and help integrate therapy concepts, techniques,education, etc. 8. PT will assess and treat for/with: Lower extremity strength, range of motion, stamina, balance, functional mobility, safety, adaptive techniques and equipment, NMR, family education.   Goals are: supervision. 9. OT will assess and treat for/with: ADL's, functional mobility, safety, upper extremity strength, adaptive techniques and equipment, NMR, family ed.   Goals are: supervision. Therapy may proceed with showering this patient. 10. SLP will assess and treat for/with: speech, communication, language, family education.  Goals are: supervision to min assist. 11. Case Management and Social Worker will assess and treat for psychological issues and discharge planning. 12. Team conference will be held weekly  to assess progress toward goals and to determine barriers to discharge. 13. Patient will receive at least 3 hours of therapy per day at least 5 days per week. 14. ELOS: 12-18 days       15. Prognosis:  excellent      I have personally performed a face to face diagnostic evaluation of this patient. Additionally, I have reviewed and concur with the physician assistant's documentation above.  Meredith Staggers, MD, FAAPMR  Lavon Paganini [place], PA-C 08/31/2017

## 2017-08-31 NOTE — H&P (Signed)
Physical Medicine and Rehabilitation Admission H&P        Chief Complaint  Patient presents with  . Code Stroke  : HPI:  Eugene [name] is a 77 y.o. right-handed male with history of prediabetes, hypertension, hyperlipidemia, tobacco abuse and atrial fibrillation maintained on Xarelto and question medical compliance.  Per chart review patient lives alone independent and driving as well as active.  One level home.  Presented 08/25/2017 with right-sided weakness and aphasia.  Blood pressure 157/98.  CT the head showed oval left thalamic hemorrhage with an estimated blood volume of 8 mL.  Mild surrounding edema and regional mass-effect.  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  Neurology follow-up currently plan is for aspirin 81 mg in 5-7 days of neuro stable post ICH with plans to repeat CT in 2-3 weeks.  If ICH resolves plan was to add Eliquis to aspirin.  Patient presently on subcutaneous heparin since 08/28/2017 Echocardiogram with ejection fraction of 60% no wall motion abnormalities. Physical and Occupational therapy evaluations completed with recommendations of physical medicine rehab consult.patient was admitted for a comprehensive rehab program     Review of Systems  Constitutional: Negative for chills and fever.  HENT: Negative for hearing loss.   Eyes: Negative for blurred vision and double vision.  Respiratory: Negative for shortness of breath.   Cardiovascular: Positive for palpitations and leg swelling. Negative for chest pain.  Gastrointestinal: Positive for constipation. Negative for nausea and vomiting.  Genitourinary:  Positive for urgency. Negative for flank pain and hematuria.  Musculoskeletal: Positive for myalgias.  Psychiatric/Behavioral: Positive for depression.       Anxiety  All other systems reviewed and are negative.       Past Medical History:  Diagnosis Date  . Anxiety    . Atrial fibrillation ([place])    . BPH (benign prostatic hypertrophy)    . Cataracts, both eyes    . Coronary artery disease    . Depressed    . Hyperlipidemia    . Hypertension    . ICH (intracerebral hemorrhage) ([place]) 08/2017  . MI (myocardial infarction) ([place])    . OSA (obstructive sleep apnea)    . Pre-diabetes           Past Surgical History:  Procedure Laterality Date  . CARDIAC CATHETERIZATION N/A 06/26/2015    Procedure: Left Heart Cath and Coronary Angiography;  Surgeon: Burnell Blanks, MD;  Location: [facility];  Service: Cardiovascular;  Laterality: N/A;  . CARDIAC CATHETERIZATION   06/26/2015    Procedure: Coronary Stent Intervention;  Surgeon: Burnell Blanks, MD;  Location: [facility];  Service: Cardiovascular;;  . CARDIAC CATHETERIZATION   06/26/2015    Procedure: Coronary Balloon Angioplasty;  Surgeon: Burnell Blanks, MD;  Location: [facility];  Service: Cardiovascular;;  . CORONARY STENT PLACEMENT   10/2004         Family History  Problem Relation Age of Onset  . Sleep apnea Son      Social History:  reports that he has been smoking.  He has a 50.00 pack-year smoking history. He has never used smokeless tobacco. He  reports that he does not drink alcohol or use drugs. Allergies: No Known Allergies       Medications Prior to Admission  Medication Sig Dispense Refill  . ALPRAZolam (XANAX) 0.5 MG tablet Take 1 tablet (0.5 mg total) by mouth 3 (three) times daily as needed for anxiety. 60 tablet 0  . amLODipine (NORVASC) 10 MG tablet TAKE 1 TABLET BY MOUTH DAILY 90 tablet 3  . clopidogrel (PLAVIX) 75 MG tablet Take 75 mg by mouth daily.      . finasteride  (PROSCAR) 5 MG tablet TAKE 1 BY MOUTH DAILY 90 tablet 3  . lisinopril (PRINIVIL,ZESTRIL) 20 MG tablet TAKE 1 TABLET(20 MG) BY MOUTH DAILY 90 tablet 0  . metFORMIN (GLUCOPHAGE) 500 MG tablet TAKE 1 TABLET BY MOUTH TWICE DAILY WITH MEALS 180 tablet 0  . nitroGLYCERIN (NITROSTAT) 0.4 MG SL tablet ONE TABLET UNDER TONGUE AS NEEDED FOR CHEST PAIN EVERY 5 MINUTES- TAKE A MAX OF 3 TABLETS BEFORE ER VISIT 25 tablet 0  . rivaroxaban (XARELTO) 20 MG TABS tablet Take 20 mg by mouth daily with supper.      . metoprolol tartrate (LOPRESSOR) 25 MG tablet Take 0.5 tablets (12.5 mg total) by mouth 2 (two) times daily. Reported on 10/01/2015 (Patient not taking: Reported on 08/26/2017) 60 tablet 5  . PARoxetine (PAXIL) 20 MG tablet TAKE 1 TABLET BY MOUTH DAILY (Patient not taking: Reported on 08/26/2017) 90 tablet 3  . rosuvastatin (CRESTOR) 20 MG tablet TAKE 1 BY MOUTH DAILY NO FURTHER REFILLS WITHOUT FOLLOW UP LAB WORK (Patient not taking: Reported on 08/26/2017) 90 tablet 0      Drug Regimen Review Drug regimen was reviewed and remains appropriate without significant issues identified   Home: Home Living Family/patient expects to be discharged to:: Private residence Living Arrangements: Alone Available Help at Discharge: Family, Available 24 hours/day Type of Home: House Home Access: Stairs to enter [place]: One level Bathroom Shower/Tub: Public librarian, Charity fundraiser: Standard Home Equipment: None   Functional History: Prior Function Level of Independence: Independent Comments: drives, active, does yard work   Functional Status:  Mobility: Bed Mobility Overal bed mobility: Needs Assistance Bed Mobility: Supine to Sit Supine to sit: Supervision Sit to supine: Min assist General bed mobility comments: supervision for safety Transfers Overall transfer level: Needs assistance Equipment used: Rolling walker (2 wheeled) Transfers: Sit to/from Stand Sit to Stand: Min assist General  transfer comment: assistance to power up into standing from EOB and recliner; cues for hand placement to stand and for safe use of AD Ambulation/Gait Ambulation/Gait assistance: Mod assist, +2 safety/equipment(chair follow) Ambulation Distance (Feet): 100 Feet Assistive device: Rolling walker (2 wheeled) Gait Pattern/deviations: Decreased stride length, Wide base of support, Decreased step length - right, Step-to pattern, Trunk flexed General Gait Details: multimodal cues for posture and R step length; R lateral lean; assistance required for balance and managing RW; vc for safe use of AD Gait velocity interpretation: <1.31 ft/sec, indicative of household ambulator   ADL: ADL Overall ADL's : Needs assistance/impaired Eating/Feeding: Minimal assistance, Sitting Grooming: Wash/dry hands, Oral care, Minimal assistance, Standing, Brushing hair Upper Body Bathing: Moderate assistance, Sitting Lower Body Bathing: Maximal assistance, Sit to/from stand Upper Body Dressing : Moderate assistance, Sitting Lower Body Dressing: Maximal assistance, Sit to/from stand Lower Body Dressing Details (indicate cue type and reason): Significant difficulty with processing and sequencing of task in addition to balance deficits and RUE coordination deficits Toilet Transfer: Ambulation, RW, Minimal assistance Toilet Transfer Details (indicate  cue type and reason): Simulated by sit to stand from EOB with functional mobility; pt fatigues with functional tasks Functional mobility during ADLs: Minimal assistance, Rolling walker General ADL Comments: Pt continues with balance deficits as well as disorientation.  He needs overall min assist for standing at the sink with grooming tasks as well as short distance mobility with use of the RW.  Increased trunk and cervical flexion noted in standing with increased lean to the right as well.  Able to use the RUE at a diminshed level with selfcare with decreased speed for gross and FM  coordination.    Cognition: Cognition Overall Cognitive Status: Impaired/Different from baseline Orientation Level: Oriented to person, Oriented to place, Disoriented to time, Disoriented to situation Cognition Arousal/Alertness: Awake/alert Behavior During Therapy: WFL for tasks assessed/performed Overall Cognitive Status: Impaired/Different from baseline Area of Impairment: Safety/judgement, Awareness, Memory Orientation Level: Situation, Disoriented to Current Attention Level: Sustained Memory: Decreased short-term memory Following Commands: Follows one step commands consistently Safety/Judgement: Decreased awareness of safety, Decreased awareness of deficits Awareness: Intellectual Problem Solving: Slow processing, Decreased initiation, Difficulty sequencing, Requires verbal cues, Requires tactile cues General Comments: word finding difficulties   Physical Exam: Blood pressure (!) 142/99, pulse 67, temperature 98.3 F (36.8 C), temperature source Oral, resp. rate 17, height 6' (1.829 m), weight 91.1 kg (200 lb 13.4 oz), SpO2 97 %. Physical Exam  Vitals reviewed. Constitutional: He is oriented to person, place, and time.  HENT:  Head: Normocephalic.  Eyes: EOM are normal. Right eye exhibits no discharge.  Neck: Normal range of motion. Neck supple. No thyromegaly present.  Cardiovascular: Regular rhythm. Exam reveals no gallop and no friction rub.  No murmur heard. Cardiac rate controlled  Respiratory: Effort normal and breath sounds normal. No respiratory distress. He has no wheezes.  GI: Soft. Bowel sounds are normal. He exhibits no distension. There is no tenderness. There is no rebound.  Musculoskeletal: Normal range of motion.  Neurological: He is alert and oriented to person, place, and time.  Pt follows simple commands. Word finding deficits, non-fluent language. RUE 4 to 4+/5 with PD and decreased [place]. LUE 4+/5. RLE 4/5, LLE 4+/5. Mild sensory loss right arm and leg.     Skin: Skin is warm and dry.      Lab Results Last 48 Hours       Results for orders placed or performed during the hospital encounter of 08/25/17 (from the past 48 hour(s))  Glucose, capillary     Status: Abnormal    Collection Time: 08/29/17 12:33 PM  Result Value Ref Range    Glucose-Capillary 117 (H) 65 - 99 mg/dL    Comment 1 Notify RN      Comment 2 Document in Chart    Glucose, capillary     Status: Abnormal    Collection Time: 08/29/17  4:35 PM  Result Value Ref Range    Glucose-Capillary 122 (H) 65 - 99 mg/dL    Comment 1 Notify RN      Comment 2 Document in Chart    Glucose, capillary     Status: Abnormal    Collection Time: 08/29/17 10:50 PM  Result Value Ref Range    Glucose-Capillary 169 (H) 65 - 99 mg/dL    Comment 1 Notify RN    Glucose, capillary     Status: Abnormal    Collection Time: 08/30/17  6:10 AM  Result Value Ref Range    Glucose-Capillary 122 (H) 65 - 99 mg/dL  Comment 1 Notify RN    Glucose, capillary     Status: None    Collection Time: 08/30/17 11:41 AM  Result Value Ref Range    Glucose-Capillary 94 65 - 99 mg/dL  Glucose, capillary     Status: Abnormal    Collection Time: 08/30/17  4:47 PM  Result Value Ref Range    Glucose-Capillary 127 (H) 65 - 99 mg/dL  Glucose, capillary     Status: Abnormal    Collection Time: 08/30/17  9:36 PM  Result Value Ref Range    Glucose-Capillary 127 (H) 65 - 99 mg/dL    Comment 1 Notify RN      Comment 2 Document in Chart    Glucose, capillary     Status: None    Collection Time: 08/31/17  6:31 AM  Result Value Ref Range    Glucose-Capillary 98 65 - 99 mg/dL    Comment 1 Notify RN      Comment 2 Document in Chart    Glucose, capillary     Status: Abnormal    Collection Time: 08/31/17 11:22 AM  Result Value Ref Range    Glucose-Capillary 134 (H) 65 - 99 mg/dL    Comment 1 Notify RN      Comment 2 Document in Chart        Imaging Results (Last 48 hours)  No results found.           Medical  Problem List and Plan: 1.  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  Plan follow-up CT 2-3 weeks if ICH resolves add Eliquis to aspirin secondary to history of atrial fibrillation             -admit to inpatient rehab 2.  DVT Prophylaxis/Anticoagulation: Subcutaneous heparin initiated 08/28/2017 3. Pain Management: Tylenol as needed 4. Mood: Paxil 20 mg daily, Seroquel 25 mg nightly Xanax 0.5 mg 3 times daily as needed 5. Neuropsych: This patient is capable of making decisions on his own behalf. 6. Skin/Wound Care: Routine skin checks 7. Fluids/Electrolytes/Nutrition: Routine I&O's with follow-up chemistries upon admit 8.  Atrial fibrillation.  Aspirin to be initiated 81 mg 09/01/2017.  Cardiac rate controlled 9.  Hypertension.  Norvasc 10 mg daily, lisinopril 20 mg daily, Lopressor 12.5 mg twice daily 10.  Prediabetic.  SSI.  Check blood sugars before meals and at bedtime 11.  BPH.  Proscar 5 mg daily.  Check PVR x3 12.  Hyperlipidemia.  Crestor   Post Admission Physician Evaluation: 1. Functional deficits secondary  to left thalamic hemorrhage. 2. Patient is admitted to receive collaborative, interdisciplinary care between the physiatrist, rehab nursing staff, and therapy team. 3. Patient's level of medical complexity and substantial therapy needs in context of that medical necessity cannot be provided at a lesser intensity of care such as a SNF. 4. Patient has experienced substantial functional loss from his/her baseline which was documented above under the "Functional History" and "Functional Status" headings.  Judging by the patient's diagnosis, physical exam, and functional history, the patient has potential for functional progress which will result in measurable gains while on inpatient rehab.  These gains will be of substantial and practical use upon discharge  in facilitating mobility and self-care at the household level. 5. Physiatrist will provide 24 hour  management of medical needs as well as oversight of the therapy plan/treatment and provide guidance as appropriate regarding the interaction of the two. 6. The Preadmission Screening has been reviewed and patient status is  unchanged unless otherwise stated above. 7. 24 hour rehab nursing will assist with bladder management, bowel management, safety, skin/wound care, disease management, medication administration, pain management and patient education  and help integrate therapy concepts, techniques,education, etc. 8. PT will assess and treat for/with: Lower extremity strength, range of motion, stamina, balance, functional mobility, safety, adaptive techniques and equipment, NMR, family education.   Goals are: supervision. 9. OT will assess and treat for/with: ADL's, functional mobility, safety, upper extremity strength, adaptive techniques and equipment, NMR, family ed.   Goals are: supervision. Therapy may proceed with showering this patient. 10. SLP will assess and treat for/with: speech, communication, language, family education.  Goals are: supervision to min assist. 11. Case Management and Social Worker will assess and treat for psychological issues and discharge planning. 12. Team conference will be held weekly to assess progress toward goals and to determine barriers to discharge. 13. Patient will receive at least 3 hours of therapy per day at least 5 days per week. 14. ELOS: 12-18 days       15. Prognosis:  excellent         I have personally performed a face to face diagnostic evaluation of this patient. Additionally, I have reviewed and concur with the physician assistant's documentation above.   Meredith Staggers, MD, FAAPMR  Lavon Paganini [place], PA-C 08/31/2017

## 2017-08-31 NOTE — Discharge Summary (Addendum)
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 Anxiety   . Atrial fibrillation ([place])   . BPH (benign prostatic hypertrophy)   . Cataracts, both eyes   . Coronary artery disease   . Depressed   . Hyperlipidemia   . Hypertension   . ICH (intracerebral hemorrhage) ([place]) 08/2017  . MI (myocardial infarction) ([place])   . OSA (obstructive sleep apnea)   . Pre-diabetes    Past Surgical History:  Procedure Laterality Date  . CARDIAC CATHETERIZATION N/A 06/26/2015   Procedure: Left Heart Cath and Coronary Angiography;  Surgeon: Burnell Blanks, MD;  Location: [facility];  Service: Cardiovascular;  Laterality: N/A;  . CARDIAC CATHETERIZATION  06/26/2015   Procedure: Coronary Stent Intervention;  Surgeon: Burnell Blanks, MD;  Location: [facility];  Service: Cardiovascular;;  . CARDIAC CATHETERIZATION  06/26/2015   Procedure: Coronary Balloon Angioplasty;  Surgeon: Burnell Blanks, MD;  Location: [facility];  Service: Cardiovascular;;  . CORONARY STENT PLACEMENT   10/2004    Allergies as of 08/31/2017   No Known Allergies     Medication List    STOP taking these medications   ALPRAZolam 0.5 MG tablet Commonly known as:  XANAX   amLODipine 10 MG tablet Commonly known as:  NORVASC   clopidogrel 75 MG tablet Commonly known as:  PLAVIX   metFORMIN 500 MG tablet Commonly known as:  GLUCOPHAGE   PARoxetine 20 MG tablet Commonly known as:  PAXIL   rivaroxaban 20 MG Tabs tablet Commonly known as:  XARELTO     TAKE these medications   aspirin 81 MG EC tablet Take 1 tablet (81 mg total) by mouth daily. Start taking on:  09/01/2017   finasteride 5 MG tablet Commonly known as:  PROSCAR TAKE 1 BY MOUTH DAILY   heparin 5000 UNIT/ML injection Inject 1 mL (5,000 Units total) into the skin every 8 (eight) hours.   insulin aspart 100 UNIT/ML injection Commonly known as:  novoLOG Inject 0-15 Units into the skin 3 (three) times daily with meals.   insulin aspart 100 UNIT/ML injection Commonly known as:  novoLOG Inject 0-5 Units into the skin at bedtime.   ipratropium-albuterol 0.5-2.5 (3) MG/3ML Soln Commonly known as:  DUONEB Take 3 mLs by nebulization every 4 (four) hours as needed.   lisinopril 20 MG tablet Commonly known as:  PRINIVIL,ZESTRIL Take 1 tablet (20 mg total) by mouth daily. Start taking on:  09/01/2017 What changed:  See the new instructions.   metoprolol tartrate 25 MG tablet  Commonly known as:  LOPRESSOR Take 0.5 tablets (12.5 mg total) by mouth 2 (two) times daily. What changed:  additional instructions   nitroGLYCERIN 0.4 MG SL tablet Commonly known as:  NITROSTAT ONE TABLET UNDER TONGUE AS NEEDED FOR CHEST PAIN EVERY 5 MINUTES- TAKE A MAX OF 3 TABLETS BEFORE ER VISIT   pantoprazole 40 MG tablet Commonly known as:  PROTONIX Take 1 tablet (40 mg total) by mouth daily. Start taking on:  09/01/2017   QUEtiapine 25 MG tablet Commonly known as:  SEROQUEL Take 1 tablet (25 mg total) by mouth at bedtime.    rosuvastatin 20 MG tablet Commonly known as:  CRESTOR TAKE 1 BY MOUTH DAILY NO FURTHER REFILLS WITHOUT FOLLOW UP LAB WORK   senna-docusate 8.6-50 MG tablet Commonly known as:  Senokot-S Take 1 tablet by mouth 2 (two) times daily.       LABORATORY STUDIES CBC    Component Value Date/Time   WBC 8.9 08/29/2017 0522   RBC 4.88 08/29/2017 0522   HGB 14.7 08/29/2017 0522   HCT 44.5 08/29/2017 0522   PLT 172 08/29/2017 0522   MCV 91.2 08/29/2017 0522   MCH 30.1 08/29/2017 0522   MCHC 33.0 08/29/2017 0522   RDW 15.2 08/29/2017 0522   LYMPHSABS 1.9 08/25/2017 1034   MONOABS 0.9 08/25/2017 1034   EOSABS 0.2 08/25/2017 1034   BASOSABS 0.0 08/25/2017 1034   CMP    Component Value Date/Time   NA 141 08/29/2017 0522   K 3.6 08/29/2017 0522   CL 108 08/29/2017 0522   CO2 23 08/29/2017 0522   GLUCOSE 111 (H) 08/29/2017 0522   BUN 31 (H) 08/29/2017 0522   CREATININE 1.30 (H) 08/29/2017 0522   CREATININE 1.14 04/08/2016 1138   CALCIUM 8.6 (L) 08/29/2017 0522   PROT 7.1 08/25/2017 1034   ALBUMIN 3.9 08/25/2017 1034   AST 15 08/25/2017 1034   ALT 14 (L) 08/25/2017 1034   ALKPHOS 72 08/25/2017 1034   BILITOT 0.9 08/25/2017 1034   GFRNONAA 51 (L) 08/29/2017 0522   GFRNONAA 62 04/08/2016 1138   GFRAA 59 (L) 08/29/2017 0522   GFRAA 71 04/08/2016 1138   COAGS Lab Results  Component Value Date   INR 1.10 08/28/2017   INR 1.07 08/27/2017   INR 1.05 08/26/2017   Lipid Panel    Component Value Date/Time   CHOL 162 08/25/2017 1505   TRIG 112 08/25/2017 1505   HDL 39 (L) 08/25/2017 1505   CHOLHDL 4.2 08/25/2017 1505   VLDL 22 08/25/2017 1505   LDLCALC 101 (H) 08/25/2017 1505   HgbA1C  Lab Results  Component Value Date   HGBA1C 6.4 (H) 08/25/2017   Urinalysis    Component Value Date/Time   COLORURINE yellow 08/22/2007 0822   APPEARANCEUR Clear 08/22/2007 0822   LABSPEC 1.020 08/22/2007 0822   PHURINE 5.5 08/22/2007 0822   HGBUR negative 08/22/2007 0822    BILIRUBINUR negative 08/22/2007 0822   UROBILINOGEN 0.2 08/22/2007 0822   NITRITE negative 08/22/2007 0822   Urine Drug Screen     Component Value Date/Time   LABOPIA NONE DETECTED 08/26/2017 0815   COCAINSCRNUR NONE DETECTED 08/26/2017 0815   LABBENZ NONE DETECTED 08/26/2017 0815   AMPHETMU NONE DETECTED 08/26/2017 0815   THCU NONE DETECTED 08/26/2017 0815   LABBARB NONE DETECTED 08/26/2017 0815    Alcohol Level    Component Value Date/Time   ETH <10 08/25/2017 1034    SIGNIFICANT DIAGNOSTIC STUDIES Ct Angio Head W Or Wo Contrast  Result Date: 08/25/2017 CLINICAL DATA:  Left flank hemorrhage. Basilar artery stenosis or occlusion. EXAM: CT ANGIOGRAPHY HEAD AND NECK TECHNIQUE: Multidetector CT imaging of the head and neck was performed using the standard protocol during bolus administration of intravenous contrast. Multiplanar CT image reconstructions and MIPs were obtained to evaluate the vascular anatomy. Carotid stenosis measurements (when applicable) are obtained utilizing NASCET criteria, using the distal internal carotid diameter as the denominator. CONTRAST:  57mL ISOVUE-370 IOPAMIDOL (ISOVUE-370) INJECTION 76% COMPARISON:  CT of the head and MRI and MRA brain from the same day. FINDINGS: Stepped CTA NECK FINDINGS Aortic arch: Extensive atherosclerotic calcifications are present at the aorta and branch vessel origins without significant stenosis or aneurysm. Right carotid system: Scattered mural calcifications are present along the right common carotid artery. Dense calcifications are present at the right carotid bifurcation. Minimal luminal diameter is 1.5 mm this compares with a more normal distal lumen of 5.8 mm. The cervical right ICA is within normal limits of this level. Left carotid system: Atherosclerotic calcifications are present along the wall the left common carotid artery without significant stenosis. Dense calcifications are present at the left carotid bifurcation without a  significant stenosis relative to the more distal vessel. Vertebral arteries: Vertebral arteries vertebral arteries originate from the subclavian arteries bilaterally. There is dense calcification at the origin of the right vertebral artery. The vertebral arteries are codominant. There are high-grade near occlusive stenoses of both vertebral arteries at the dural margin. No other stenoses are present in the neck. Skeleton: Endplate degenerative changes are most pronounced at C3-4 C5-6 and C6-7. Extensive dental disease is present. No focal lytic or blastic lesions are present. Other neck: No significant mucosal or submucosal lesions are present. The palatine tonsils are enlarged bilaterally. No discrete mass lesion is present. Vocal cords are midline and symmetric. There is no significant adenopathy. Salivary glands are within normal limits. Multiple left-sided thyroid nodules are less than 10 mm. No dominant nodule is present. Upper chest: Lung apices are clear. No focal nodule, mass, or airspace disease present. The thoracic inlet is within normal limits. Review of the MIP images confirms the above findings CTA HEAD FINDINGS Anterior circulation: Atherosclerotic calcifications are present within the cavernous internal carotid arteries bilaterally without a significant stenosis relative to the more distal vessel through the ICA termini. The A1 and M1 segments are within normal limits. There is a proximal bifurcation of the left M1 segment, a normal variant moderate narrowing is present in the distal right M1 segment. MCA branch vessels are intact. Anterior communicating artery is patent. Moderate proximal left A2 segment stenosis is present. Distal small vessel disease is present throughout. Posterior circulation: The right AICA is dominant. A smaller left AICA is present. Neither PICA vessel is evident. There is a fenestration of the vertebrobasilar junction. There is some flow in both vertebral arteries. Stenosis  is worse on the left. The basilar artery is occluded just distal to the vertebrobasilar junction at the level of the AICA vessels. A fetal type left posterior cerebral artery is present. The right posterior cerebral artery and basilar tip is reconstituted by a right posterior communicating artery. The basilar tip is reconstituted to the level of the superior cerebellar arteries. The left superior cerebellar artery is occluded or highly stenotic. Proximal PCA branch vessels are intact bilaterally. Distal PCA disease is present bilaterally. Venous sinuses: The dural sinuses are patent. Straight sinus and deep cerebral veins are intact. Cortical veins are unremarkable. Anatomic variants: Fetal type left posterior  cerebral artery. Delayed phase: Postcontrast images again demonstrate the left thalamic hemorrhage. There is no enhancement. Calcification and remote lacunar infarct of the left cerebellum are stable. Review of the MIP images confirms the above findings IMPRESSION: 1. 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 2. Reconstitution of the basilar tip to the level of the superior cerebellar arteries via a right posterior communicating artery. 3. 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 4. High-grade stenosis of the vertebral arteries at the dural margin and beyond, left greater than right. 5. 70% stenosis of the right carotid bifurcation. 6. Dense calcifications in the distal left common carotid artery and carotid bifurcation without significant stenosis relative to the more distal vessel. 7. Mild distal right M1 segment stenosis. 8. Moderate proximal left A2 segment stenosis. Electronically Signed   By: San Morelle M.D.   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 EXAM: CT HEAD WITHOUT CONTRAST TECHNIQUE: Contiguous axial images were obtained from the base of the skull through the vertex without intravenous contrast. COMPARISON:  CTA head and neck, brain MRI/MRA, and head CT without contrast 08/25/2017. FINDINGS: Brain: Hyperdense hemorrhage centered in the left thalamus has not significantly changed. Dimensions today are 19 x 20 x 26 millimeters (AP by transverse by CC), for an estimated blood volume of 5 milliliters. No intraventricular or extra-axial extension. Surrounding edema has mildly progressed since 08/25/2017, but regional mass effect is mild and stable. Elsewhere Stable gray-white matter differentiation throughout the brain. No new intracranial hemorrhage. No new intracranial mass effect. No ventriculomegaly. Vascular: Extensive calcified atherosclerosis. Skull: No acute osseous abnormality identified. Sinuses/Orbits: Increased mucosal thickening in the right maxillary sinus but otherwise stable. Other: Stable orbit and scalp soft tissues. IMPRESSION: 1. Stable to slightly smaller left thalamic intra-axial hemorrhage since the presentation CT on 08/25/2017. Mildly increased surrounding edema, but mild regional mass effect is stable. 2. No new intracranial abnormality. Underlying advanced chronic small vessel disease. Electronically Signed   By: Genevie Ann M.D.   On: 08/27/2017 07:29   Ct Angio Neck W Or Wo Contrast  Result Date: 08/25/2017 CLINICAL DATA:  Left flank hemorrhage. Basilar artery stenosis or occlusion. EXAM: CT ANGIOGRAPHY HEAD AND NECK TECHNIQUE: Multidetector CT imaging of the head and neck was performed using the standard protocol during bolus administration of intravenous contrast. Multiplanar CT image reconstructions and MIPs were obtained to evaluate the vascular anatomy. Carotid stenosis measurements (when applicable) are obtained utilizing NASCET criteria, using the distal internal carotid diameter as the  denominator. CONTRAST:  55mL ISOVUE-370 IOPAMIDOL (ISOVUE-370) INJECTION 76% COMPARISON:  CT of the head and MRI and MRA brain from the same day. FINDINGS: Stepped CTA NECK FINDINGS Aortic arch: Extensive atherosclerotic calcifications are present at the aorta and branch vessel origins without significant stenosis or aneurysm. Right carotid system: Scattered mural calcifications are present along the right common carotid artery. Dense calcifications are present at the right carotid bifurcation. Minimal luminal diameter is 1.5 mm this compares with a more normal distal lumen of 5.8 mm. The cervical right ICA is within normal limits of this level. Left carotid system: Atherosclerotic calcifications are present along the wall the left common carotid artery without significant stenosis. Dense calcifications are present at the left carotid bifurcation without a significant stenosis relative to the more distal vessel. Vertebral  arteries: Vertebral arteries vertebral arteries originate from the subclavian arteries bilaterally. There is dense calcification at the origin of the right vertebral artery. The vertebral arteries are codominant. There are high-grade near occlusive stenoses of both vertebral arteries at the dural margin. No other stenoses are present in the neck. Skeleton: Endplate degenerative changes are most pronounced at C3-4 C5-6 and C6-7. Extensive dental disease is present. No focal lytic or blastic lesions are present. Other neck: No significant mucosal or submucosal lesions are present. The palatine tonsils are enlarged bilaterally. No discrete mass lesion is present. Vocal cords are midline and symmetric. There is no significant adenopathy. Salivary glands are within normal limits. Multiple left-sided thyroid nodules are less than 10 mm. No dominant nodule is present. Upper chest: Lung apices are clear. No focal nodule, mass, or airspace disease present. The thoracic inlet is within normal limits. Review  of the MIP images confirms the above findings CTA HEAD FINDINGS Anterior circulation: Atherosclerotic calcifications are present within the cavernous internal carotid arteries bilaterally without a significant stenosis relative to the more distal vessel through the ICA termini. The A1 and M1 segments are within normal limits. There is a proximal bifurcation of the left M1 segment, a normal variant moderate narrowing is present in the distal right M1 segment. MCA branch vessels are intact. Anterior communicating artery is patent. Moderate proximal left A2 segment stenosis is present. Distal small vessel disease is present throughout. Posterior circulation: The right AICA is dominant. A smaller left AICA is present. Neither PICA vessel is evident. There is a fenestration of the vertebrobasilar junction. There is some flow in both vertebral arteries. Stenosis is worse on the left. The basilar artery is occluded just distal to the vertebrobasilar junction at the level of the AICA vessels. A fetal type left posterior cerebral artery is present. The right posterior cerebral artery and basilar tip is reconstituted by a right posterior communicating artery. The basilar tip is reconstituted to the level of the superior cerebellar arteries. The left superior cerebellar artery is occluded or highly stenotic. Proximal PCA branch vessels are intact bilaterally. Distal PCA disease is present bilaterally. Venous sinuses: The dural sinuses are patent. Straight sinus and deep cerebral veins are intact. Cortical veins are unremarkable. Anatomic variants: Fetal type left posterior cerebral artery. Delayed phase: Postcontrast images again demonstrate the left thalamic hemorrhage. There is no enhancement. Calcification and remote lacunar infarct of the left cerebellum are stable. Review of the MIP images confirms the above findings IMPRESSION: 1. 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 2. Reconstitution  of the basilar tip to the level of the superior cerebellar arteries via a right posterior communicating artery. 3. 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 4. High-grade stenosis of the vertebral arteries at the dural margin and beyond, left greater than right. 5. 70% stenosis of the right carotid bifurcation. 6. Dense calcifications in the distal left common carotid artery and carotid bifurcation without significant stenosis relative to the more distal vessel. 7. Mild distal right M1 segment stenosis. 8. Moderate proximal left A2 segment stenosis. Electronically Signed   By: San Morelle M.D.   On: 08/25/2017 17:19   Mr Jodene Nam Head Wo Contrast  Addendum Date: 08/25/2017   ADDENDUM REPORT: 08/25/2017 15:12 ADDENDUM: This study was discussed by telephone with Dr. Kerney Elbe on 08/25/2017 at 15:12 . Electronically Signed   By: Genevie Ann M.D.   On: 08/25/2017 15:12   Result Date: 08/25/2017 CLINICAL DATA:  77 year old male with code stroke presentation and acute left thalamic hemorrhage on head CT earlier today. EXAM: MRI HEAD WITHOUT AND WITH CONTRAST MRA HEAD WITHOUT CONTRAST TECHNIQUE: Multiplanar, multiecho pulse sequences of the brain and surrounding structures were obtained without and with intravenous contrast. Angiographic images of the head were obtained using MRA technique without contrast. CONTRAST:  26mL MULTIHANCE GADOBENATE DIMEGLUMINE 529 MG/ML IV SOLN COMPARISON:  Head CT without contrast 1035 hours. FINDINGS: MRI HEAD FINDINGS Brain: Oval mixed hyper- and hypointense T2 signal and iso- to hypointense T1 signal hemorrhage re-demonstrated in the left thalamus encompassing 20 x 21 x 26 millimeters (AP by transverse by CC) for an estimated blood volume of 5-6 mL. Surrounding edema with mild regional mass effect. No intraventricular extension identified. No extra-axial extension identified. Associated diffusion susceptibility artifact. No convincing surrounding area  of restricted diffusion. There are punctate areas of enhancement within the hemorrhage (series 12, images 26 and 27), but this represents delayed phase enhancement and not arterial phase spot sign. No restricted diffusion elsewhere. There are several superimposed chronic microhemorrhages elsewhere in the left hemisphere. Chronic hemosiderin and/or dystrophic calcification noted in the left cerebellum. 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 No cortical encephalomalacia. No other abnormal intracranial enhancement. No dural thickening. No midline shift, ventriculomegaly, extra-axial collection. Cervicomedullary junction and pituitary are within normal limits. Vascular: The major anterior circulation flow voids are preserved. There is intermittent loss of the posterior circulation flow voids, see MRA findings below. The major dural venous sinuses are enhancing and appear to be patent. Skull and upper cervical spine: Negative visible cervical spine. Normal bone marrow signal. Sinuses/Orbits: Postoperative changes to both globes. Moderate ethmoid sinus mucosal thickening. Other: Visible internal auditory structures appear normal. Negative scalp and face soft tissues. MRA HEAD FINDINGS Antegrade flow in the distal vertebral arteries. Vertebral artery irregularity which may be exacerbated by motion artifact. 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 The distal 3rd of the basilar appears most occluded, although the basilar tip is reconstituted by the right posterior communicating artery. There is a fetal type left PCA origin. The SCA origins are patent. The bilateral PCA branches are patent without stenosis. There is no flow signal identified within the left thalamic hemorrhage. Antegrade flow in  both ICA siphons which are mildly dolichoectatic. No siphon stenosis. Ophthalmic and right posterior communicating artery origins are normal. Patent carotid termini. Normal MCA and ACA origins. The anterior communicating artery and visible ACA branches are within normal limits. The left MCA M1 segment bifurcates early. The left MCA branches appear within normal limits, no stenosis. There is mild to moderate irregularity and stenosis of the right MCA M1 segment. Prominent right until a striate branch arises from this area. The right MCA bifurcation remains patent. The right MCA branches are patent with up to mild irregularity. IMPRESSION: 1. 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 2. Left thalamic hemorrhage is stable from the CT earlier today with no ventricular or extra-axial extension. Mild surrounding edema and regional mass effect. 3. 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 4. Anterior circulation atherosclerosis primarily at the right MCA M1 segment  where there is up to moderate stenosis. Electronically Signed: By: Genevie Ann M.D. On: 08/25/2017 15:08   Mr Jeri Cos PJ Contrast  Addendum Date: 08/25/2017   ADDENDUM REPORT: 08/25/2017 15:12 ADDENDUM: This study was discussed by telephone with Dr. Kerney Elbe on 08/25/2017 at 15:12 . Electronically Signed   By: Genevie Ann M.D.   On: 08/25/2017 15:12   Result Date: 08/25/2017 CLINICAL DATA:  77 year old male with code stroke presentation and acute left thalamic hemorrhage on head CT earlier today. EXAM: MRI HEAD WITHOUT AND WITH CONTRAST MRA HEAD WITHOUT CONTRAST TECHNIQUE: Multiplanar, multiecho pulse sequences of the brain and surrounding structures were obtained without and with intravenous contrast.  Angiographic images of the head were obtained using MRA technique without contrast. CONTRAST:  54mL MULTIHANCE GADOBENATE DIMEGLUMINE 529 MG/ML IV SOLN COMPARISON:  Head CT without contrast 1035 hours. FINDINGS: MRI HEAD FINDINGS Brain: Oval mixed hyper- and hypointense T2 signal and iso- to hypointense T1 signal hemorrhage re-demonstrated in the left thalamus encompassing 20 x 21 x 26 millimeters (AP by transverse by CC) for an estimated blood volume of 5-6 mL. Surrounding edema with mild regional mass effect. No intraventricular extension identified. No extra-axial extension identified. Associated diffusion susceptibility artifact. No convincing surrounding area of restricted diffusion. There are punctate areas of enhancement within the hemorrhage (series 12, images 26 and 27), but this represents delayed phase enhancement and not arterial phase spot sign. No restricted diffusion elsewhere. There are several superimposed chronic microhemorrhages elsewhere in the left hemisphere. Chronic hemosiderin and/or dystrophic calcification noted in the left cerebellum. 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 No cortical encephalomalacia. No other abnormal intracranial enhancement. No dural thickening. No midline shift, ventriculomegaly, extra-axial collection. Cervicomedullary junction and pituitary are within normal limits. Vascular: The major anterior circulation flow voids are preserved. There is intermittent loss of the posterior circulation flow voids, see MRA findings below. The major dural venous sinuses are enhancing and appear to be patent. Skull and upper cervical spine: Negative visible cervical spine. Normal bone marrow signal. Sinuses/Orbits: Postoperative changes to both globes. Moderate ethmoid sinus mucosal thickening. Other: Visible internal auditory structures appear normal. Negative scalp and face soft  tissues. MRA HEAD FINDINGS Antegrade flow in the distal vertebral arteries. Vertebral artery irregularity which may be exacerbated by motion artifact. 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 The distal 3rd of the basilar appears most occluded, although the basilar tip is reconstituted by the right posterior communicating artery. There is a fetal type left PCA origin. The SCA origins are patent. The bilateral PCA branches are patent without stenosis. There is no flow signal identified within the left thalamic hemorrhage. Antegrade flow in both ICA siphons which are mildly dolichoectatic. No siphon stenosis. Ophthalmic and right posterior communicating artery origins are normal. Patent carotid termini. Normal MCA and ACA origins. The anterior communicating artery and visible ACA branches are within normal limits. The left MCA M1 segment bifurcates early. The left MCA branches appear within normal limits, no stenosis. There is mild to moderate irregularity and stenosis of the right MCA M1 segment. Prominent right until a striate branch arises from this area. The right MCA bifurcation remains patent. The right MCA branches are patent with up to mild irregularity. IMPRESSION: 1. 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 2. Left thalamic hemorrhage is stable from the CT earlier today with no ventricular or extra-axial extension. Mild surrounding edema and regional mass effect. 3. 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 4. Anterior circulation atherosclerosis primarily at the right MCA M1 segment where there is up to moderate stenosis.  Electronically Signed: By: Genevie Ann M.D. On: 08/25/2017 15:08   Dg Chest Port 1 View  Result Date: 08/28/2017 CLINICAL DATA:  Shortness of breath.  Low O2 levels. EXAM: PORTABLE CHEST 1 VIEW COMPARISON:  Chest x-rays dated 08/26/2017 and 08/25/2017. FINDINGS: Mild cardiomegaly is stable. Atherosclerotic changes noted at the aortic arch. Lungs are clear. No pleural effusion or pneumothorax seen. No acute or suspicious osseous finding. IMPRESSION: 1. No active disease.  No evidence of pneumonia or pulmonary edema. 2. Stable mild cardiomegaly. 3. Aortic atherosclerosis. Electronically Signed   By: Franki Cabot M.D.   On: 08/28/2017 13:52   Dg Chest Port 1 View  Result Date: 08/26/2017 CLINICAL DATA:  Desaturation with shortness of breath EXAM: PORTABLE CHEST 1 VIEW COMPARISON:  08/25/2017, 06/24/2015 FINDINGS: Linear atelectasis at the left lung base. Borderline cardiomegaly with aortic atherosclerosis. No pleural effusion or pneumothorax. IMPRESSION: 1. Linear atelectasis or scarring at the left base 2. Borderline to mild cardiomegaly Electronically Signed   By: Donavan Foil M.D.   On: 08/26/2017 20:17   Dg Chest Portable 1 View  Result Date: 08/25/2017 CLINICAL DATA:  Code stroke. History of atrial fibrillation, coronary artery disease with stent placement, previous MI, current smoker. EXAM: PORTABLE CHEST 1 VIEW COMPARISON:  Chest x-ray of June 24, 2015 FINDINGS: The lungs are well-expanded. There is no focal infiltrate. The interstitial markings are coarse though stable. The heart is top-normal in size. The pulmonary vascularity is normal. There is calcification in the wall of the aortic arch. The bony thorax exhibits no acute abnormality. IMPRESSION: Mild chronic bronchitic changes, stable. No pneumonia, CHF, nor other acute cardiopulmonary abnormality. Thoracic aortic atherosclerosis. Electronically Signed   By: David  Martinique M.D.   On: 08/25/2017 13:33   Ct Head Code Stroke Wo  Contrast  Result Date: 08/25/2017 CLINICAL DATA:  Code stroke. 77 year old male with right side weakness since 0930 hours. EXAM: CT HEAD WITHOUT CONTRAST TECHNIQUE: Contiguous axial images were obtained from the base of the skull through the vertex without intravenous contrast. COMPARISON:  None. FINDINGS: Brain: Oval hyperdense hemorrhage occupying the left thalamus encompasses 20 x 26 x 29 millimeters (AP by transverse by CC) for an estimated intra-axial blood volume of 8 milliliters. There is mild surrounding edema and regional mass effect. No intraventricular or extra-axial extension. Elsewhere there are small dystrophic calcifications in the basal ganglia, and a coarse dystrophic calcification in the left cerebellar hemisphere which appears to be associated with a small chronic left cerebellar infarct. No other intracranial hemorrhage identified. Confluent and widespread bilateral cerebral white matter hypodensity. Bilateral deep gray matter heterogeneity is associated. No cerebral cortical encephalomalacia is identified. No midline shift, ventriculomegaly, mass effect, or acute cortically based infarct. Vascular: Extensive intracranial calcified atherosclerosis with prominent involvement of the right MCA. No suspicious intracranial vascular hyperdensity. Skull: Negative. Sinuses/Orbits: Moderate ethmoid sinus mucosal thickening. Mild frontal sinus mucosal thickening. Other visible paranasal sinuses and mastoids are well pneumatized. Other: Postoperative changes to both globes. Otherwise negative orbits soft tissues. Visualized scalp soft tissues are  within normal limits. ASPECTS [facility] Stroke Program Early CT Score) Not applicable, acute hemorrhage. IMPRESSION: 1. Oval left thalamic hemorrhage with an estimated blood volume of 8 mL. Mild surrounding edema and regional mass effect. No intraventricular or extra-axial extension of blood. 2. Underlying advanced chronic small vessel disease, indicating likely  small vessel etiology of #1. 3. 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 4. Extensive intracranial calcified atherosclerosis. Critical Value/emergent results were called by telephone at the time of interpretation on 08/25/2017 at 1052 hours to Dr. Francine Graven , who verbally acknowledged these results. Electronically Signed   By: Genevie Ann M.D.   On: 08/25/2017 10:52       HISTORY OF PRESENT ILLNESS KNUTE MAZZUCA is an 77 y.o. male with history of hypertension, hyperlipidemia and atrial fibrillation for which he is on Xarelto.  At baseline the patient is quite independent, drives a car, was able to take care of all of his activities of daily living.  However, per family he may not have been keeping up with his medications in terms of exact doses and schedules, with the patient confirming this. Modified Rankin: Rankin Score=0. He went to sleep last night around 10 PM (LKW 08/24/2017 at 2000) and woke around 930 this a.m., noting that he had decreased sensation along his right side along with significant dysarthria.  For that reason he was seen at the [facility] ED, where a CT of his head revealed a left thalamic bleed.  Blood pressure was 157/98.  Patient is not a good historian and it is unclear what his blood pressures usually are. CT shows a L thalamic ICH. Intracerebral Hemorrhage (ICH) Score = 0. He was transferred to [facility] for further evaluation and treatment.    HOSPITAL COURSE Mr. ISADOR CASTILLE is a 77 y.o. 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 Occlusion with  distal constitution  CTA head and neck - stable hematoma, BA occlusion with distal recon, b/l VA stenosis L>R.   Repeat CT hematoma stable  2D Echo EF 55-60%  LDL 101  HgbA1c 6.4  clopidogrel 75 mg daily and Xarelto (rivaroxaban) daily prior to admission, now on No antithrombotic due to Pace. Add ASA 81mg  7 days post ICH as neuro stable (09/01/17), and repeat CT in 2-3 weeks, if ICH resolved, add eliquis to ASA 81mg .   Patient counseled to be compliant with his antithrombotic medications  Ongoing aggressive stroke risk factor management  Therapy recommendations:  CIR  Disposition:  CIR planned, however, family decided on SNF when copays discussed and desire for longer rehab time, then they changed their mind back to CIR. Will d/c to CIR today.  Wheezing with SOB, resolved  Mild wheezing and SOB  CXR no pulmonary edema  EKG afib, no ST-T changes  duoneb PRN  Lasix 40mg  once  Leukocytosis - WBC 7.4->10.3->11.7->8.9  Afib on Xarelto  Tele showed persistent AFib  Rate controlled, no RVR  Hold off AC for now.   Will repeat CT in 2-3 weeks, if ICH resolves, start eliquis  CAD/MI  On  plavix PTA  Hold off for now due to [place]  ASA 81mg  7 days post ICH if neuro stable (09/01/17)  BA occlusion  Confirmed on CTA and MRA  With distal constitution from collaterals  Consistent with chronic occlusion  Start ASA on 09/01/17 if neuro stable  BP goal 120-140, avoid low BP  Hypertensive Emergency  BP as high as 171/118  Treated with Cleviprex in hospital  On home norvasc, lisinopril and metoprolol, resumed in hopsital  BP goal < 140 in hospital   Long term BP goal 120-140. Avoid hypotension due to BA occlusion, avoid hypertension due to anticoagulation use.  Need BP check closely at home  Hyperlipidemia  Home meds:  crestor 20   LDL 101, goal < 70  Now on crestor 20  Continue statin at discharge  Diabetes type II  On metformin at  home  HGBA1c 6.4  SSI in hosp  Glucoses stable, WNL not on metformin here  Resume metformin per rehab  Sundowning  May have underlying cognitive impairment  on Seroquel 25 nightly  Improved since on seroquel  Other Stroke Risk Factors  Hx stroke/TIA - imaging showed old left cerebellar infarct with calcification  Obstructive sleep apnea  DISCHARGE EXAM Blood pressure (!) 142/99, pulse 67, temperature 98.3 F (36.8 C), temperature source Oral, resp. rate 17, height 6' (1.829 m), weight 91.1 kg (200 lb 13.4 oz), SpO2 97 %. General - Well nourished, well developed, easily arousable. Cardiovascular - irregularly irregular heart rate and rhythm. Neuro - A&O x 3. Paucity of speech, able to follow simple commands, naming 2/4, able to repeat. Visual field test not cooperative.  Mild right nasolabial fold flattening.  Tongue midline.  Pronator drift on the right upper extremity, right lower extremity DF 4+/5.  Otherwise normal strength. no Babinski.  Sensation symmetrical.  Coordination intact. Gait not tested  Discharge Diet   Diet heart healthy/carb modified Thin liquids  DISCHARGE PLAN  Disposition:  Inpatient rehab for ongoing PT, OT and ST  Repeat CT head in 2-3 weeks, if hemorrhage resolved, add Eliquis to aspirin 81 for secondary stroke prevention  Ongoing risk factor control by Primary Care Physician at time of discharge  Follow-up Susy Frizzle, MD in 2 weeks.  Follow-up in [facility] in 4 weeks, office to schedule an appointment.   40 minutes were spent preparing discharge.  Burnetta Sabin, MSN, APRN, ANVP-BC, AGPCNP-BC Advanced Practice Stroke Nurse [place] for Schedule & Pager information 08/31/2017 2:29 PM   I have personally examined this patient, reviewed notes, independently viewed imaging studies, participated in medical decision making and plan of care.ROS completed by me personally and  pertinent positives fully documented  I have made any additions or clarifications directly to the above note. Agree with note above.    Antony Contras, MD Medical Director [place] Pager: [phone] 08/31/2017 5:40 PM

## 2017-08-31 NOTE — Care Management Note (Signed)
Case Management Note  Patient Details  Name: Eugene [name] MRN: [id number] Date of Birth: January 27, 1941  Subjective/Objective:                    Action/Plan: Family has decided to have patient go to CIR. Pt discharging to CIR today. CM signing off.  Expected Discharge Date:                  Expected Discharge Plan:  Skilled Nursing Facility  In-House Referral:  Clinical Social Work  Discharge planning Services     Post Acute Care Choice:    Choice offered to:     DME Arranged:    DME Agency:     HH Arranged:    [place] Agency:     Status of Service:  Completed, signed off  If discussed at [employer], dates discussed:    Additional Comments:  Pollie Friar, RN 08/31/2017, 12:14 PM

## 2017-08-31 NOTE — Progress Notes (Signed)
Rehab admissions - I received a call from unit case manager that patient/family now wanted inpatient rehab.  Patient not able to get a SNF bed in [place] that he wanted.  I met with patient, his daughter and we spoke to patient's ex-wife by phone.  All family and patient are now in agreement to inpatient rehab.  Bed available and will admit to inpatient rehab today.  Call me for questions.  #[phone]

## 2017-08-31 NOTE — Progress Notes (Signed)
Patient's family requested [facility] in [place] for rehab. CSW did follow up with [facility] this morning and they will not offer a bed for patient - they are not in network with patient's insurance. Family's other choice in [place], [facility], also will not offer a bed.   CSW spoke to patient and his daughter, Jenny Reichmann, at bedside. Jenny Reichmann indicated she prefers for new referrals to be sent out to other SNFs to determine if patient can get a SNF bed at a facility in network with insurace. CSW asked if patient and family would like follow up from CIR, as they initially had insurance auth for patient to admit there.   CSW received call from patient's son and son agreeable for CIR follow up, as well as new SNF referrals.   CSW sent out referrals and will follow for disposition planning. CSW did page MD and requested 30 day note be signed - note is on chart and is needed for PASRR number before patient can admit to a facility. Patient will also need [facility] authorization before admitting to facility.  Estanislado Emms, [place]

## 2017-08-31 NOTE — Progress Notes (Signed)
  Speech Language Pathology Treatment: Cognitive-Linquistic  Patient Details Name: ADRYAN SHIN MRN: [id number] DOB: 09-17-1940 Today's Date: 08/31/2017 Time: 1025-1040 SLP Time Calculation (min) (ACUTE ONLY): 15 min  Assessment / Plan / Recommendation Clinical Impression  Skilled treatment session focused on pt's communication goals. SLP facilitated session by providing Mod A sentence completion and semantic cues for word finding. Pt expresses frustration with word finding deficits, uses rote phrases and yes/no to communicate. Pt's daughter present and demonstrates decreased understanding of pt's aphasia as evidenced by statements to pt - "you have forgotten where I live" when in actuality, pt knows where she lives but is unable to express name of town. Emotional support given to pt and education on compensatory strategies to daughter (such as using yes/no or giving pt choices). Pt left upright in bed with daughter present. Continue per current plan of care.    HPI HPI: Mr. ULRICH SOULES is a 78 y.o. 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 No tPA given due to left thalamic small ICH likely due to HTN in the setting of Xarelto and plavix use s/p [employer].       SLP Plan  Continue with current plan of care       Recommendations                   General recommendations: Rehab consult Follow up Recommendations: Inpatient Rehab SLP Visit Diagnosis: Aphasia (R47.01) Plan: Continue with current plan of care       GO                Kou Gucciardo 08/31/2017, 11:21 AM

## 2017-08-31 NOTE — Progress Notes (Signed)
Eugene Diones, RN  Rehab Admission Coordinator  Physical Medicine and Rehabilitation  PMR Pre-admission  Signed  Date of Service:  08/31/2017 11:32 AM       Related encounter: ED to Hosp-Admission (Current) from 08/25/2017 in [facility]      Signed            [] Hide copied text  [] Hover for details   PMR Admission Coordinator Pre-Admission Assessment  Patient: Eugene [name] is an 77 y.o., male MRN: [id number] DOB: 09/29/1940 Height: 6' (182.9 cm) Weight: 91.1 kg (200 lb 13.4 oz)                                                                                                                                            Insurance Information HMO: Yes   PPO:       PCP:       IPA:       80/20:       OTHER:  Group [id number] PRIMARY:  BCBS Medicare      Policy#: [id number]      Subscriber:  patient CM Name: Eugene [name]      Phone#: [phone]     Fax#: [phone] Pre-Cert#:                                          Employer:  Retired Benefits:  Phone #: [phone]     Name:  On line Eff. 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 Per chart review  patient lives alone independent and driving as well as active. One level home. Presented 08/25/2017 with right-sided weakness and aphasia. Blood pressure 157/98. CT the head showed oval left thalamic hemorrhage with an estimated blood volume of 8 mL. Mild surrounding edema and regional mass-effect. MRA positive for tandem critical stenosis and  occlusion of the distal basilar artery but the basilar tip reconstituted by the right posterior communicating artery. 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 Neurology follow-up currently plan is for aspirin 81 mg in 5-7 days of neuro stable post ICH with plans to repeat CT in 2-3 weeks. If ICH resolves plan was to add Eliquis to aspirin. Echocardiogram is pending. Occupational therapy evaluation completed with recommendations of physical medicine rehab consult.  Total: 0=NIH  Past Medical History      Past Medical History:  Diagnosis Date  . Anxiety   . Atrial fibrillation ([place])   . BPH (benign prostatic hypertrophy)   . Cataracts, both eyes   . Coronary artery disease   . Depressed   . Hyperlipidemia   . Hypertension   . ICH (intracerebral hemorrhage) ([place]) 08/2017  . MI (myocardial infarction) ([place])   . OSA (obstructive sleep apnea)   . Pre-diabetes     Family History  family history includes Sleep apnea in his son.  Prior Rehab/Hospitalizations: No previous rehab  Has the patient had major surgery during 100 days prior to admission? No  Current Medications   Current Facility-Administered Medications:  .  acetaminophen (TYLENOL) tablet 650 mg, 650 mg, Oral, Q4H PRN **OR** [DISCONTINUED] acetaminophen (TYLENOL) solution 650 mg, 650 mg, Per Tube, Q4H PRN **OR** [DISCONTINUED] acetaminophen (TYLENOL) suppository 650 mg, 650 mg,  Rectal, Q4H PRN, Kerney Elbe, MD .  ALPRAZolam Duanne Moron) tablet 0.5 mg, 0.5 mg, Oral, TID PRN, Kerney Elbe, MD, 0.5 mg at 08/27/17 2106 .  amLODipine (NORVASC) tablet 10 mg, 10 mg, Oral, Daily, Kerney Elbe, MD, 10 mg at 08/31/17 1012 .  [START ON 09/01/2017] aspirin EC tablet 81 mg, 81 mg, Oral, Daily, Biby, Sharon L, NP .  finasteride (PROSCAR) tablet 5 mg, 5 mg, Oral, Daily, Kerney Elbe, MD, 5 mg at 08/31/17 1012 .  heparin injection 5,000 Units, 5,000 Units, Subcutaneous, Q8H, Rosalin Hawking, MD, 5,000 Units at 08/31/17 0501 .  hydrALAZINE (APRESOLINE) injection 5-10 mg, 5-10 mg, Intravenous, Q4H PRN, Rosalin Hawking, MD, 10 mg at 08/31/17 0459 .  insulin aspart (novoLOG) injection 0-15 Units, 0-15 Units, Subcutaneous, TID WC, Kerney Elbe, MD, 2 Units at 08/30/17 1751 .  insulin aspart (novoLOG) injection 0-5 Units, 0-5 Units, Subcutaneous, QHS, Lindzen, Eric, MD .  ipratropium-albuterol (DUONEB) 0.5-2.5 (3) MG/3ML nebulizer solution 3 mL, 3 mL, Nebulization, Q4H PRN, Rosalin Hawking, MD, 3 mL at 08/28/17 1627 .  lisinopril (PRINIVIL,ZESTRIL) tablet 20 mg, 20 mg, Oral, Daily, Kerney Elbe, MD, 20 mg at 08/31/17 1013 .  metoprolol tartrate (LOPRESSOR) tablet 12.5 mg, 12.5 mg, Oral, BID, Kerney Elbe, MD, 12.5 mg at 08/31/17 1013 .  pantoprazole (PROTONIX) EC tablet 40 mg, 40 mg, Oral, Daily, Rosalin Hawking, MD, 40 mg at 08/31/17 1013 .  PARoxetine (PAXIL) tablet 20 mg, 20 mg, Oral, Daily, Kerney Elbe, MD, 20 mg at 08/31/17 1016 .  QUEtiapine (SEROQUEL) tablet 25 mg, 25 mg, Oral, QHS, Rosalin Hawking, MD, 25 mg at 08/30/17 2044 .  rosuvastatin (CRESTOR) tablet 20 mg, 20 mg, Oral, Daily, Rosalin Hawking, MD, 20 mg at 08/31/17 1013 .  senna-docusate (Senokot-S) tablet 1 tablet, 1 tablet, Oral, BID, Kerney Elbe, MD, 1 tablet at 08/31/17 1013  Patients Current Diet: Fall precautions Diet heart healthy/carb modified Room service appropriate? Yes; Fluid consistency: Thin  Precautions /  Restrictions Precautions  Precautions: Fall Restrictions Weight Bearing Restrictions: No   Has the patient had 2 or more falls or a fall with injury in the past year?Yes.  Patient reports 2 falls with no injury.  Prior Activity Level Community (5-7x/wk): Went out daily, was driving.  Home Assistive Devices / Equipment Home Assistive Devices/Equipment: None Home Equipment: None  Prior Device Use: Indicate devices/aids used by the patient prior to current illness, exacerbation or injury? Occasional cane use.  Prior Functional Level Prior Function Level of Independence: Independent Comments: drives, active, does yard work  Self Care: Did the patient need help bathing, dressing, using the toilet or eating?  Independent  Indoor Mobility: Did the patient need assistance with walking from room to room (with or without device)? Independent  Stairs: Did the patient need assistance with internal or external stairs (with or without device)? Independent  Functional Cognition: Did the patient need help planning regular tasks such as shopping or remembering to take medications? Independent  Current Functional Level Cognition  Overall Cognitive Status: Impaired/Different from baseline Current Attention Level: Sustained Orientation Level: Oriented to person, Oriented to place, Disoriented to time, Disoriented to situation Following Commands: Follows one step commands consistently Safety/Judgement: Decreased awareness of safety, Decreased awareness of deficits General Comments: word finding difficulties    Extremity Assessment (includes Sensation/Coordination)  Upper Extremity Assessment: RUE deficits/detail RUE Deficits / Details: Grossly 4/5. Poor coordination. R inattention noted during functional tasks. Reports sensation intact RUE Coordination: decreased fine motor, decreased gross motor  Lower Extremity Assessment: RLE deficits/detail RLE Deficits / Details: proximal  weaknesses, uncoordinated movement, but no focus weaknesses RLE Sensation: WNL RLE Coordination: decreased fine motor    ADLs  Overall ADL's : Needs assistance/impaired Eating/Feeding: Minimal assistance, Sitting Grooming: Wash/dry hands, Oral care, Minimal assistance, Standing, Brushing hair Upper Body Bathing: Moderate assistance, Sitting Lower Body Bathing: Maximal assistance, Sit to/from stand Upper Body Dressing : Moderate assistance, Sitting Lower Body Dressing: Maximal assistance, Sit to/from stand Lower Body Dressing Details (indicate cue type and reason): Significant difficulty with processing and sequencing of task in addition to balance deficits and RUE coordination deficits Toilet Transfer: Ambulation, RW, Minimal assistance Toilet Transfer Details (indicate cue type and reason): Simulated by sit to stand from EOB with functional mobility; pt fatigues with functional tasks Functional mobility during ADLs: Minimal assistance, Rolling walker General ADL Comments: Pt continues with balance deficits as well as disorientation.  He needs overall min assist for standing at the sink with grooming tasks as well as short distance mobility with use of the RW.  Increased trunk and cervical flexion noted in standing with increased lean to the right as well.  Able to use the RUE at a diminshed level with selfcare with decreased speed for gross and FM coordination.     Mobility  Overal bed mobility: Needs Assistance Bed Mobility: Supine to Sit Supine to sit: Supervision Sit to supine: Min assist General bed mobility comments: supervision for safety    Transfers  Overall transfer level: Needs assistance Equipment used: Rolling walker (2 wheeled) Transfers: Sit to/from Stand Sit to Stand: Min assist General transfer comment: assistance to power up into standing from EOB and recliner; cues for hand placement to stand and for safe use of AD    Ambulation / Gait / Stairs / Wheelchair  Mobility  Ambulation/Gait Ambulation/Gait assistance: Mod assist, +2 safety/equipment(chair follow) Ambulation Distance (Feet): 100 Feet Assistive device: Rolling walker (2 wheeled) Gait Pattern/deviations: Decreased stride length, Wide base of support, Decreased step length -  right, Step-to pattern, Trunk flexed General Gait Details: multimodal cues for posture and R step length; R lateral lean; assistance required for balance and managing RW; vc for safe use of AD Gait velocity interpretation: <1.31 ft/sec, indicative of household ambulator    Posture / Balance Dynamic Sitting Balance Sitting balance - Comments: R lateral lean requiring min-max assist for upright sitting balance Balance Overall balance assessment: Needs assistance Sitting-balance support: Feet supported Sitting balance-Leahy Scale: Fair Sitting balance - Comments: R lateral lean requiring min-max assist for upright sitting balance Postural control: Right lateral lean Standing balance support: Bilateral upper extremity supported Standing balance-Leahy Scale: Poor Standing balance comment: R lateral lean    Special needs/care consideration BiPAP/CPAP No CPM No Continuous Drip IV No Dialysis No      Life Vest No Oxygen No Special Bed No Trach Size No Wound Vac (area) No    Skin No                           Bowel mgmt: Last BM 08/30/17 per patient Bladder mgmt: Condom catheter Diabetic mgmt Hgb A1C 6.4    Previous Home Environment Living Arrangements: Alone Available Help at Discharge: Family, Available 24 hours/day Type of Home: House Home Layout: One level Home Access: Stairs to enter [employer] Shower/Tub: Tub/shower unit, Charity fundraiser: Standard Home Care Services: No  Discharge Living Setting Plans for Discharge Living Setting: Alone, House(Lives alone.) Type of Home at Discharge: House Discharge Home Layout: Two level, Laundry or work area in basement, Able to live on main level with  bedroom/bathroom Alternate Level Stairs-Number of Steps: Flight to basement Discharge Home Access: Stairs to enter Technical brewer of Steps: 3-4 step entry Does the patient have any problems obtaining your medications?: No  Social/Family/Support Systems Patient Roles: Parent(Has a daughter, son, sister and an ex-wife.) Contact Information: Jamarrius Salay - son - [phone] Anticipated Caregiver: Aida Puffer - daughter Anticipated Caregiver's Contact Information: Jenny Reichmann - daughter - [phone] Ability/Limitations of Caregiver: Dtr lives in [place] and son lives in [place].  Dtr plans to stay with patient upon discharge from rehab. Caregiver Availability: 24/7 Discharge Plan Discussed with Primary Caregiver: Yes Is Caregiver In Agreement with Plan?: Yes Does Caregiver/Family have Issues with Lodging/Transportation while Pt is in Rehab?: No  Goals/Additional Needs Patient/Family Goal for Rehab: PT/OT supervision, SLP supervision to min assist goals Expected length of stay: 14-18 days Cultural Considerations: None Dietary Needs: Heart healthy, carb mod, thin liquids Equipment Needs: TBD Pt/Family Agrees to Admission and willing to participate: Yes Program Orientation Provided & Reviewed with Pt/Caregiver Including Roles  & Responsibilities: Yes  Decrease burden of Care through IP rehab admission: N/A  Possible need for SNF placement upon discharge: Not planned  Patient Condition: This patient's medical and functional status has changed since the consult dated: 08/26/17 in which the Rehabilitation Physician determined and documented that the patient's condition is appropriate for intensive rehabilitative care in an inpatient rehabilitation facility. See "History of Present Illness" (above) for medical update. Functional changes are:  Currently requiring mod assist +2 to ambulate 100 feet RW. Patient's medical and functional status update has been discussed with the  Rehabilitation physician and patient remains appropriate for inpatient rehabilitation. Will admit to inpatient rehab today.  Preadmission Screen Completed By:  Eugene [name], 08/31/2017 11:49 AM ______________________________________________________________________   Discussed status with Dr. Naaman Plummer on 08/31/17 at 1149 and received telephone approval for admission today.  Admission Coordinator:  Eugene [name],  time 1149/Date 08/31/17             Cosigned by: Meredith Staggers, MD at 08/31/2017 1:07 PM  Revision History

## 2017-08-31 NOTE — Progress Notes (Signed)
Admitted to unit 4W. Oriented to unit. Reviewed meds, rehab schedule, safety measures and plan of care with pt and family. States understanding of information reviewed. Ellouise Newer

## 2017-08-31 NOTE — Progress Notes (Signed)
Kirsteins, Luanna Salk, MD  Physician  Physical Medicine and Rehabilitation  Consult Note  Signed  Date of Service:  08/26/2017 3:14 PM       Related encounter: ED to Hosp-Admission (Current) from 08/25/2017 in [facility]      Signed      Expand All Collapse All       [] Hide copied text  [] Hover for details        Physical Medicine and Rehabilitation Consult Reason for Consult: Decreased functional mobility Referring Physician: Dr.Xu   HPI: Eugene [name] is a 77 y.o. right-handed male with history of hypertension, hyperlipidemia, tobacco abuse and atrial fibrillation maintained on Xarelto and question medical compliance.  Per chart review patient lives alone independent and driving as well as active.  One level home.  Presented 08/25/2017 with right-sided weakness and aphasia.  Blood pressure 157/98.  CT the head showed oval left thalamic hemorrhage with an estimated blood volume of 8 mL.  Mild surrounding edema and regional mass-effect.  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  Neurology follow-up currently plan is for aspirin 81 mg in 5-7 days of neuro stable post ICH with plans to repeat CT in 2-3 weeks.  If ICH resolves plan was to add Eliquis to aspirin.  Echocardiogram is pending.  Occupational therapy evaluation completed with recommendations of physical medicine rehab consult.  pt with expressive aphasia   Review of Systems  Unable to perform ROS: Language       Past Medical History:  Diagnosis Date  . Anxiety   . Atrial fibrillation ([place])   . BPH (benign prostatic hypertrophy)   .  Cataracts, both eyes   . Coronary artery disease   . Depressed   . Hyperlipidemia   . Hypertension   . MI (myocardial infarction) ([place])   . OSA (obstructive sleep apnea)         Past Surgical History:  Procedure Laterality Date  . CARDIAC CATHETERIZATION N/A 06/26/2015   Procedure: Left Heart Cath and Coronary Angiography;  Surgeon: Burnell Blanks, MD;  Location: [facility];  Service: Cardiovascular;  Laterality: N/A;  . CARDIAC CATHETERIZATION  06/26/2015   Procedure: Coronary Stent Intervention;  Surgeon: Burnell Blanks, MD;  Location: [facility];  Service: Cardiovascular;;  . CARDIAC CATHETERIZATION  06/26/2015   Procedure: Coronary Balloon Angioplasty;  Surgeon: Burnell Blanks, MD;  Location: [facility];  Service: Cardiovascular;;  . CORONARY STENT PLACEMENT  10/2004        Family History  Problem Relation Age of Onset  . Sleep apnea Son    Social History:  reports that he has been smoking.  He has a 50.00 pack-year smoking history. He does not have any smokeless tobacco history on file. He reports that he does not drink alcohol or use drugs. Allergies: No Known Allergies       Medications Prior to Admission  Medication Sig Dispense Refill  . lisinopril (PRINIVIL,ZESTRIL) 20 MG tablet TAKE 1 TABLET(20 MG) BY MOUTH DAILY 90 tablet 0  . metFORMIN (GLUCOPHAGE) 500 MG tablet TAKE 1 TABLET BY MOUTH TWICE DAILY WITH MEALS 180 tablet 0  .  ALPRAZolam (XANAX) 0.5 MG tablet Take 1 tablet (0.5 mg total) by mouth 3 (three) times daily as needed for anxiety. 60 tablet 0  . amLODipine (NORVASC) 10 MG tablet TAKE 1 TABLET BY MOUTH DAILY 90 tablet 3  . finasteride (PROSCAR) 5 MG tablet TAKE 1 BY MOUTH DAILY 90 tablet 3  . metoprolol tartrate (LOPRESSOR) 25 MG tablet Take 0.5 tablets (12.5 mg total) by mouth 2 (two) times daily. Reported on 10/01/2015 60 tablet 5  . nitroGLYCERIN (NITROSTAT) 0.4 MG SL tablet ONE TABLET UNDER TONGUE AS  NEEDED FOR CHEST PAIN EVERY 5 MINUTES- TAKE A MAX OF 3 TABLETS BEFORE ER VISIT 25 tablet 0  . PARoxetine (PAXIL) 20 MG tablet TAKE 1 TABLET BY MOUTH DAILY 90 tablet 3  . rosuvastatin (CRESTOR) 20 MG tablet TAKE 1 BY MOUTH DAILY NO FURTHER REFILLS WITHOUT FOLLOW UP LAB WORK 90 tablet 0    Home: Home Living Family/patient expects to be discharged to:: Private residence Living Arrangements: Alone Available Help at Discharge: Family, Available 24 hours/day Type of Home: House Home Access: Stairs to enter Home Layout: One level Bathroom Shower/Tub: Public librarian, Charity fundraiser: Standard Home Equipment: None  Functional History: Prior Function Level of Independence: Independent Comments: drives, active, does yard work Functional Status:  Mobility: Bed Mobility Overal bed mobility: Needs Assistance Bed Mobility: Supine to Sit Supine to sit: Min assist General bed mobility comments: Cues throughout for sequencing and safety. Assist with RLE to EOB and for balance during sitting Transfers Overall transfer level: Needs assistance Equipment used: 2 person hand held assist Transfers: Sit to/from Stand Sit to Stand: Mod assist, +2 physical assistance, +2 safety/equipment General transfer comment: Min-mod assist to power up from EOB/chair; pt impulsive and stands without warning. Blocked R knee as pt was scooting off EOB on multiple attempts at sit to stand. Mod assist required for standing balance with strong R lateral lean initially  ADL: ADL Overall ADL's : Needs assistance/impaired Eating/Feeding: Minimal assistance, Sitting Grooming: Minimal assistance, Sitting Upper Body Bathing: Moderate assistance, Sitting Lower Body Bathing: Maximal assistance, Sit to/from stand Upper Body Dressing : Moderate assistance, Sitting Lower Body Dressing: Maximal assistance, Sit to/from stand Lower Body Dressing Details (indicate cue type and reason): Significant difficulty with processing  and sequencing of task in addition to balance deficits and RUE coordination deficits Toilet Transfer: Moderate assistance, +2 for physical assistance, Ambulation, RW Toilet Transfer Details (indicate cue type and reason): Simulated by sit to stand from EOB with functional mobility; pt fatigues with functional tasks Functional mobility during ADLs: Moderate assistance, +2 for physical assistance, Rolling walker  Cognition: Cognition Overall Cognitive Status: Impaired/Different from baseline Orientation Level: Oriented to person, Disoriented to time, Disoriented to situation, Oriented to place Cognition Arousal/Alertness: Awake/alert Behavior During Therapy: Restless, Impulsive Overall Cognitive Status: Impaired/Different from baseline Area of Impairment: Attention, Memory, Following commands, Safety/judgement, Awareness, Problem solving Current Attention Level: Focused Memory: Decreased short-term memory Following Commands: Follows one step commands inconsistently Safety/Judgement: Decreased awareness of safety, Decreased awareness of deficits Awareness: Intellectual Problem Solving: Slow processing, Decreased initiation, Difficulty sequencing, Requires verbal cues, Requires tactile cues General Comments: Poor attention to task, requires multimodal cues for safe participation in functional activities  Blood pressure (!) 152/87, pulse (!) 155, temperature 97.9 F (36.6 C), temperature source Oral, resp. rate 20, height 6' (1.829 m), weight 85.3 kg (188 lb 0.8 oz), SpO2 95 %.  General: No acute distress Mood and affect are appropriate Heart: Regular rate and rhythm no rubs murmurs  or extra sounds Lungs: Clear to auscultation, breathing unlabored, no rales or wheezes Abdomen: Positive bowel sounds, soft nontender to palpation, nondistended Extremities: No clubbing, cyanosis, or edema Skin: No evidence of breakdown, no evidence of rash Neurologic: Cranial nerves II through XII intact,  motor strength is 5/5 in Left deltoid, bicep, tricep, grip, hip flexor, knee extensors, ankle dorsiflexor and plantar flexor, 4/5 on Right side Sensory exam normal sensation to light touch in bilateral upper and lower extremities Cerebellar exam normal finger to nose to finger as well as heel to shin in LEFT upper and lower extremities, mild dysmetria RUE Musculoskeletal: Full range of motion in all 4 extremities. No joint swelling Patient has difficulty following commands even with gestural cues. 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 Does the need for close, 24 hr/day medical supervision in concert with the patient's rehab needs make it unreasonable for this patient to be served in a less intensive setting? Yes 2. Co-Morbidities requiring supervision/potential complications: Hypertension, atrial fibrillation, urinary retention 3. Due to bladder management, bowel management, safety, skin/wound care, disease management, medication administration, pain management and patient education, does the patient require 24 hr/day rehab nursing? Yes 4. Does the patient require coordinated care of a physician, rehab nurse, PT (1-2 hrs/day, 5 days/week), OT (1-2 hrs/day, 5 days/week) and SLP (.5-1 hrs/day, 5 days/week) to address physical and functional deficits in the context of the above medical diagnosis(es)? Yes Addressing deficits in the following areas: balance, endurance, locomotion, strength, transferring, bowel/bladder control, bathing, dressing, feeding, grooming, toileting, cognition, speech, language, swallowing and psychosocial support 5. Can the patient actively participate in an intensive therapy program of at least 3 hrs of therapy per day at least 5 days per week? Yes 6. The potential for patient to make measurable gains while on inpatient rehab  is excellent 7. Anticipated functional outcomes upon discharge from inpatient rehab are supervision  with PT, supervision with OT, supervision and min assist with SLP. 8. Estimated rehab length of stay to reach the above functional goals is: 14-18d 9. Anticipated D/C setting: Home 10. Anticipated post D/C treatments: [facility] therapy 11. Overall Rehab/Functional Prognosis: excellent  RECOMMENDATIONS: This patient's condition is appropriate for continued rehabilitative care in the following setting: CIR Patient has agreed to participate in recommended program. Yes Note that insurance prior authorization may be required for reimbursement for recommended care.  Comment:   Charlett Blake M.D. [facility] FAAPM&R (Sports Med, Neuromuscular Med) Diplomate Am Board of Electrodiagnostic Med  Elizabeth Sauer 08/26/2017          Revision History                        Routing History

## 2017-08-31 NOTE — PMR Pre-admission (Signed)
PMR Admission Coordinator Pre-Admission Assessment  Patient: Eugene [name] is an 77 y.o., male MRN: [id number] DOB: 1940-12-03 Height: 6' (182.9 cm) Weight: 91.1 kg (200 lb 13.4 oz)             Insurance Information HMO: Yes   PPO:       PCP:       IPA:       80/20:       OTHER:  Group [id number] PRIMARY:  BCBS Medicare      Policy#: [id number]      Subscriber:  patient CM Name: Limmie Patricia      Phone#: [phone]     Fax#: [phone] Pre-Cert#:                                          Employer:  Retired Benefits:  Phone #: [phone]     Name:  On line Eff. 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 right-handed male with history of hypertension, hyperlipidemia, tobacco abuse and atrial fibrillation maintained on Xarelto and question medical compliance.  Per chart review patient lives alone independent and driving as well as active.  One level home.  Presented 08/25/2017 with right-sided weakness and aphasia.  Blood pressure 157/98.  CT the head showed oval left thalamic hemorrhage with an estimated blood volume of 8 mL.  Mild surrounding edema and regional mass-effect.  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  CT Angie of head and neck  showed 70% stenosis of the right carotid bifurcation.  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  Neurology follow-up currently plan is for aspirin 81 mg in 5-7 days of neuro stable post ICH with plans to repeat CT in 2-3 weeks.  If ICH resolves plan was to add Eliquis to aspirin.  Echocardiogram is pending.  Occupational therapy evaluation completed with recommendations of physical medicine rehab consult.  Total: 0=NIH  Past Medical History  Past Medical History:  Diagnosis Date  . Anxiety   . Atrial fibrillation ([place])   . BPH (benign prostatic hypertrophy)   . Cataracts, both eyes   . Coronary artery disease   . Depressed   . Hyperlipidemia   . Hypertension   . ICH (intracerebral hemorrhage) ([place]) 08/2017  . MI (myocardial infarction) ([place])   . OSA (obstructive sleep apnea)   . Pre-diabetes     Family History  family history includes Sleep apnea  in his son.  Prior Rehab/Hospitalizations: No previous rehab  Has the patient had major surgery during 100 days prior to admission? No  Current Medications   Current Facility-Administered Medications:  .  acetaminophen (TYLENOL) tablet 650 mg, 650 mg, Oral, Q4H PRN **OR** [DISCONTINUED] acetaminophen (TYLENOL) solution 650 mg, 650 mg, Per Tube, Q4H PRN **OR** [DISCONTINUED] acetaminophen (TYLENOL) suppository 650 mg, 650 mg, Rectal, Q4H PRN, Kerney Elbe, MD .  ALPRAZolam Duanne Moron) tablet 0.5 mg, 0.5 mg, Oral, TID PRN, Kerney Elbe, MD, 0.5 mg at 08/27/17 2106 .  amLODipine (NORVASC) tablet 10 mg, 10 mg, Oral, Daily, Kerney Elbe, MD, 10 mg at 08/31/17 1012 .  [START ON 09/01/2017] aspirin EC tablet 81 mg, 81 mg, Oral, Daily, Biby, Sharon L, NP .  finasteride (PROSCAR) tablet 5 mg, 5 mg, Oral, Daily, Kerney Elbe, MD, 5 mg at 08/31/17 1012 .  heparin injection 5,000 Units, 5,000 Units, Subcutaneous, Q8H, Rosalin Hawking, MD, 5,000 Units  at 08/31/17 0501 .  hydrALAZINE (APRESOLINE) injection 5-10 mg, 5-10 mg, Intravenous, Q4H PRN, Rosalin Hawking, MD, 10 mg at 08/31/17 0459 .  insulin aspart (novoLOG) injection 0-15 Units, 0-15 Units, Subcutaneous, TID WC, Kerney Elbe, MD, 2 Units at 08/30/17 1751 .  insulin aspart (novoLOG) injection 0-5 Units, 0-5 Units, Subcutaneous, QHS, Lindzen, Eric, MD .  ipratropium-albuterol (DUONEB) 0.5-2.5 (3) MG/3ML nebulizer solution 3 mL, 3 mL, Nebulization, Q4H PRN, Rosalin Hawking, MD, 3 mL at 08/28/17 1627 .  lisinopril (PRINIVIL,ZESTRIL) tablet 20 mg, 20 mg, Oral, Daily, Kerney Elbe, MD, 20 mg at 08/31/17 1013 .  metoprolol tartrate (LOPRESSOR) tablet 12.5 mg, 12.5 mg, Oral, BID, Kerney Elbe, MD, 12.5 mg at 08/31/17 1013 .  pantoprazole (PROTONIX) EC tablet 40 mg, 40 mg, Oral, Daily, Rosalin Hawking, MD, 40 mg at 08/31/17 1013 .  PARoxetine (PAXIL) tablet 20 mg, 20 mg, Oral, Daily, Kerney Elbe, MD, 20 mg at 08/31/17 1016 .  QUEtiapine (SEROQUEL) tablet 25 mg, 25 mg, Oral, QHS, Rosalin Hawking, MD, 25 mg at 08/30/17 2044 .  rosuvastatin (CRESTOR) tablet 20 mg, 20 mg, Oral, Daily, Rosalin Hawking, MD, 20 mg at 08/31/17 1013 .  senna-docusate (Senokot-S) tablet 1 tablet, 1 tablet, Oral, BID, Kerney Elbe, MD, 1 tablet at 08/31/17 1013  Patients Current Diet: Fall precautions Diet heart healthy/carb modified Room service appropriate? Yes; Fluid consistency: Thin  Precautions / Restrictions Precautions Precautions: Fall Restrictions Weight Bearing Restrictions: No   Has the patient had 2 or more falls or a fall with injury in the past year?Yes.  Patient reports 2 falls with no injury.  Prior Activity Level Community (5-7x/wk): Went out daily, was driving.  Home Assistive Devices / Equipment Home Assistive Devices/Equipment: None Home Equipment: None  Prior Device Use: Indicate devices/aids used by the patient prior to current illness, exacerbation or injury? Occasional cane use.  Prior Functional  Level Prior Function Level of Independence: Independent Comments: drives, active, does yard work  Self Care: Did the patient need help bathing, dressing, using the toilet or eating?  Independent  Indoor Mobility: Did the patient need assistance with walking from room to room (with or without device)? Independent  Stairs: Did the patient need assistance with internal or external stairs (with or without device)? Independent  Functional Cognition: Did the patient need help planning regular tasks such as shopping or remembering to take medications? Independent  Current Functional Level Cognition  Overall Cognitive Status: Impaired/Different from baseline Current Attention Level: Sustained Orientation Level: Oriented to person, Oriented to place, Disoriented to time, Disoriented  to situation Following Commands: Follows one step commands consistently Safety/Judgement: Decreased awareness of safety, Decreased awareness of deficits General Comments: word finding difficulties    Extremity Assessment (includes Sensation/Coordination)  Upper Extremity Assessment: RUE deficits/detail RUE Deficits / Details: Grossly 4/5. Poor coordination. R inattention noted during functional tasks. Reports sensation intact RUE Coordination: decreased fine motor, decreased gross motor  Lower Extremity Assessment: RLE deficits/detail RLE Deficits / Details: proximal weaknesses, uncoordinated movement, but no focus weaknesses RLE Sensation: WNL RLE Coordination: decreased fine motor    ADLs  Overall ADL's : Needs assistance/impaired Eating/Feeding: Minimal assistance, Sitting Grooming: Wash/dry hands, Oral care, Minimal assistance, Standing, Brushing hair Upper Body Bathing: Moderate assistance, Sitting Lower Body Bathing: Maximal assistance, Sit to/from stand Upper Body Dressing : Moderate assistance, Sitting Lower Body Dressing: Maximal assistance, Sit to/from stand Lower Body Dressing Details (indicate  cue type and reason): Significant difficulty with processing and sequencing of task in addition to balance deficits and RUE coordination deficits Toilet Transfer: Ambulation, RW, Minimal assistance Toilet Transfer Details (indicate cue type and reason): Simulated by sit to stand from EOB with functional mobility; pt fatigues with functional tasks Functional mobility during ADLs: Minimal assistance, Rolling walker General ADL Comments: Pt continues with balance deficits as well as disorientation.  He needs overall min assist for standing at the sink with grooming tasks as well as short distance mobility with use of the RW.  Increased trunk and cervical flexion noted in standing with increased lean to the right as well.  Able to use the RUE at a diminshed level with selfcare with decreased speed for gross and FM coordination.     Mobility  Overal bed mobility: Needs Assistance Bed Mobility: Supine to Sit Supine to sit: Supervision Sit to supine: Min assist General bed mobility comments: supervision for safety    Transfers  Overall transfer level: Needs assistance Equipment used: Rolling walker (2 wheeled) Transfers: Sit to/from Stand Sit to Stand: Min assist General transfer comment: assistance to power up into standing from EOB and recliner; cues for hand placement to stand and for safe use of AD    Ambulation / Gait / Stairs / Wheelchair Mobility  Ambulation/Gait Ambulation/Gait assistance: Mod assist, +2 safety/equipment(chair follow) Ambulation Distance (Feet): 100 Feet Assistive device: Rolling walker (2 wheeled) Gait Pattern/deviations: Decreased stride length, Wide base of support, Decreased step length - right, Step-to pattern, Trunk flexed General Gait Details: multimodal cues for posture and R step length; R lateral lean; assistance required for balance and managing RW; vc for safe use of AD Gait velocity interpretation: <1.31 ft/sec, indicative of household ambulator    Posture  / Balance Dynamic Sitting Balance Sitting balance - Comments: R lateral lean requiring min-max assist for upright sitting balance Balance Overall balance assessment: Needs assistance Sitting-balance support: Feet supported Sitting balance-Leahy Scale: Fair Sitting balance - Comments: R lateral lean requiring min-max assist for upright sitting balance Postural control: Right lateral lean Standing balance support: Bilateral upper extremity supported Standing balance-Leahy Scale: Poor Standing balance comment: R lateral lean    Special needs/care consideration BiPAP/CPAP No CPM No Continuous Drip IV No Dialysis No      Life Vest No Oxygen No Special Bed No Trach Size No Wound Vac (area) No    Skin No                           Bowel mgmt: Last BM 08/30/17 per patient Bladder mgmt: Condom catheter Diabetic mgmt Hgb  A1C 6.4    Previous Home Environment Living Arrangements: Alone Available Help at Discharge: Family, Available 24 hours/day Type of Home: House Home Layout: One level Home Access: Stairs to enter [employer] Shower/Tub: Tub/shower unit, Charity fundraiser: [place]: No  Discharge Living Setting Plans for Discharge Living Setting: Alone, House(Lives alone.) Type of Home at Discharge: House Discharge Home Layout: Two level, Laundry or work area in basement, Able to live on main level with bedroom/bathroom Alternate Level Stairs-Number of Steps: Flight to basement Discharge Home Access: Stairs to enter Technical brewer of Steps: 3-4 step entry Does the patient have any problems obtaining your medications?: No  Social/Family/Support Systems Patient Roles: Parent(Has a daughter, son, sister and an ex-wife.) Contact Information: Titus Drone - son - [phone] Anticipated Caregiver: Aida Puffer - daughter Anticipated Caregiver's Contact Information: Jenny Reichmann - daughter - [phone] Ability/Limitations of Caregiver: Dtr lives in [place] and  son lives in [place].  Dtr plans to stay with patient upon discharge from rehab. Caregiver Availability: 24/7 Discharge Plan Discussed with Primary Caregiver: Yes Is Caregiver In Agreement with Plan?: Yes Does Caregiver/Family have Issues with Lodging/Transportation while Pt is in Rehab?: No  Goals/Additional Needs Patient/Family Goal for Rehab: PT/OT supervision, SLP supervision to min assist goals Expected length of stay: 14-18 days Cultural Considerations: None Dietary Needs: Heart healthy, carb mod, thin liquids Equipment Needs: TBD Pt/Family Agrees to Admission and willing to participate: Yes Program Orientation Provided & Reviewed with Pt/Caregiver Including Roles  & Responsibilities: Yes  Decrease burden of Care through IP rehab admission: N/A  Possible need for SNF placement upon discharge: Not planned  Patient Condition: This patient's medical and functional status has changed since the consult dated: 08/26/17 in which the Rehabilitation Physician determined and documented that the patient's condition is appropriate for intensive rehabilitative care in an inpatient rehabilitation facility. See "History of Present Illness" (above) for medical update. Functional changes are:  Currently requiring mod assist +2 to ambulate 100 feet RW. Patient's medical and functional status update has been discussed with the Rehabilitation physician and patient remains appropriate for inpatient rehabilitation. Will admit to inpatient rehab today.  Preadmission Screen Completed By:  [name], 08/31/2017 11:49 AM ______________________________________________________________________   Discussed status with Dr. Naaman Plummer on 08/31/17 at 1149 and received telephone approval for admission today.  Admission Coordinator:  [name], time 1149/Date 08/31/17

## 2017-09-01 ENCOUNTER — Inpatient Hospital Stay (HOSPITAL_COMMUNITY): Payer: Medicare Other | Admitting: Occupational Therapy

## 2017-09-01 ENCOUNTER — Inpatient Hospital Stay (HOSPITAL_COMMUNITY): Payer: Medicare Other | Admitting: Speech Pathology

## 2017-09-01 ENCOUNTER — Inpatient Hospital Stay (HOSPITAL_COMMUNITY): Payer: Medicare Other | Admitting: Physical Therapy

## 2017-09-01 DIAGNOSIS — I481 Persistent atrial fibrillation: Secondary | ICD-10-CM

## 2017-09-01 DIAGNOSIS — I69320 Aphasia following cerebral infarction: Secondary | ICD-10-CM

## 2017-09-01 LAB — COMPREHENSIVE METABOLIC PANEL
ALK PHOS: 69 U/L (ref 38–126)
ALT: 33 U/L (ref 17–63)
ANION GAP: 9 (ref 5–15)
AST: 25 U/L (ref 15–41)
Albumin: 3.4 g/dL — ABNORMAL LOW (ref 3.5–5.0)
BILIRUBIN TOTAL: 1 mg/dL (ref 0.3–1.2)
BUN: 27 mg/dL — ABNORMAL HIGH (ref 6–20)
CALCIUM: 9 mg/dL (ref 8.9–10.3)
CO2: 20 mmol/L — ABNORMAL LOW (ref 22–32)
Chloride: 109 mmol/L (ref 101–111)
Creatinine, Ser: 1.23 mg/dL (ref 0.61–1.24)
GFR, EST NON AFRICAN AMERICAN: 54 mL/min — AB (ref >60)
Glucose, Bld: 98 mg/dL (ref 65–99)
POTASSIUM: 3.8 mmol/L (ref 3.5–5.1)
Sodium: 138 mmol/L (ref 135–145)
TOTAL PROTEIN: 6.6 g/dL (ref 6.5–8.1)

## 2017-09-01 LAB — CBC WITH DIFFERENTIAL/PLATELET
Basophils Absolute: 0 10*3/uL (ref 0.0–0.1)
Basophils Relative: 0 %
Eosinophils Absolute: 0.2 10*3/uL (ref 0.0–0.7)
Eosinophils Relative: 2 %
HEMATOCRIT: 48.1 % (ref 39.0–52.0)
HEMOGLOBIN: 15.5 g/dL (ref 13.0–17.0)
LYMPHS ABS: 2.6 10*3/uL (ref 0.7–4.0)
LYMPHS PCT: 29 %
MCH: 29.4 pg (ref 26.0–34.0)
MCHC: 32.2 g/dL (ref 30.0–36.0)
MCV: 91.1 fL (ref 78.0–100.0)
MONO ABS: 1.1 10*3/uL — AB (ref 0.1–1.0)
MONOS PCT: 12 %
NEUTROS ABS: 5 10*3/uL (ref 1.7–7.7)
NEUTROS PCT: 57 %
Platelets: 187 10*3/uL (ref 150–400)
RBC: 5.28 MIL/uL (ref 4.22–5.81)
RDW: 14.7 % (ref 11.5–15.5)
WBC: 8.9 10*3/uL (ref 4.0–10.5)

## 2017-09-01 LAB — GLUCOSE, CAPILLARY
GLUCOSE-CAPILLARY: 94 mg/dL (ref 65–99)
GLUCOSE-CAPILLARY: 104 mg/dL — AB (ref 65–99)
Glucose-Capillary: 78 mg/dL (ref 65–99)
Glucose-Capillary: 150 mg/dL — ABNORMAL HIGH (ref 65–99)

## 2017-09-01 NOTE — Progress Notes (Signed)
Patient information reviewed and entered into eRehab system by Weda Baumgarner, RN, CRRN, PPS Coordinator.  Information including medical coding and functional independence measure will be reviewed and updated through discharge.     Per nursing patient was given "Data Collection Information Summary for Patients in Inpatient Rehabilitation Facilities with attached "Privacy Act Statement-Health Care Records" upon admission.  

## 2017-09-01 NOTE — Evaluation (Signed)
Speech Language Pathology Assessment and Plan  Patient Details  Name: Eugene [name] MRN: [id number] Date of Birth: [birth year]  SLP Diagnosis: Aphasia  Rehab Potential: Good ELOS: 10-14 days    Today's Date: 09/01/2017 SLP Individual Time: 1300-1400 SLP Individual Time Calculation (min): 60 min   Problem List:  Patient Active Problem List   Diagnosis Date Noted  . 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 Hypertensive emergency 08/31/2017  . Sundowning 08/31/2017  . Hx of completed stroke 08/31/2017  . Thalamic hemorrhage ([place])   . Right hemiparesis ([place])   . Chronic anticoagulation   . Dysmetria   . Benign essential HTN   . Hyperlipidemia   . OSA (obstructive sleep apnea)   . 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 ICH (intracerebral hemorrhage) (HCC) - L thalamic d/t HTN in setting of Xarelto and plavix use 08/25/2017  . NSTEMI (non-ST elevated myocardial infarction) ([place])   . Diabetes mellitus type 2, noninsulin dependent ([place])   . Chest pain 06/24/2015  . Hyperglycemia 06/24/2015  . Pain in the chest   . Atrial fibrillation ([place]) 08/23/2009  . PLANTAR FASCIITIS, LEFT 12/31/2008  . TOBACCO ABUSE 04/10/2008  . REDNESS OR DISCHARGE OF EYE 03/09/2008  . RUQ PAIN 08/22/2007  . NEOPLASM, SKIN, UNCERTAIN BEHAVIOR 49/44/9675  . WEIGHT GAIN 03/17/2007  . SLEEP APNEA, OBSTRUCTIVE 09/22/2006  . MYOCARDIAL INFARCTION, HX OF 09/01/2006  . FATIGUE 09/01/2006  . HYPERLIPIDEMIA 06/22/2006  . ANXIETY 06/22/2006  . Depression 06/22/2006  . Hypertension 06/22/2006  . CAD (coronary artery disease) 06/22/2006  . BPH (benign prostatic hyperplasia) 06/22/2006   Past Medical History:  Past Medical History:  Diagnosis Date  . Anxiety   . Atrial fibrillation ([place])   . BPH (benign prostatic hypertrophy)   . Cataracts, both eyes   . Coronary artery disease   . Depressed   . Hyperlipidemia   . Hypertension   . ICH (intracerebral hemorrhage) ([place]) 08/2017   . MI (myocardial infarction) ([place])   . OSA (obstructive sleep apnea)   . Pre-diabetes    Past Surgical History:  Past Surgical History:  Procedure Laterality Date  . CARDIAC CATHETERIZATION N/A 06/26/2015   Procedure: Left Heart Cath and Coronary Angiography;  Surgeon: Burnell Blanks, MD;  Location: [facility];  Service: Cardiovascular;  Laterality: N/A;  . CARDIAC CATHETERIZATION  06/26/2015   Procedure: Coronary Stent Intervention;  Surgeon: Burnell Blanks, MD;  Location: [facility];  Service: Cardiovascular;;  . CARDIAC CATHETERIZATION  06/26/2015   Procedure: Coronary Balloon Angioplasty;  Surgeon: Burnell Blanks, MD;  Location: [facility];  Service: Cardiovascular;;  . CORONARY STENT PLACEMENT  10/2004    Assessment / Plan / Recommendation Clinical Impression Patientis a 77 y.o.right-handed malewith history ofprediabetes, hypertension, hyperlipidemia, tobacco abuse and atrial fibrillation maintained on Xarelto and question medical compliance. Per chart review patient lives alone independent and driving as well as active. One level home. Presented 08/25/2017 with right-sided weakness and aphasia. Blood pressure 157/98. CT the head showed oval left thalamic hemorrhage with an estimated blood volume of 8 mL. Mild surrounding edema and regional mass-effect. 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  Neurology follow-up currently plan is for aspirin 81 mg in 5-7 days of neuro stable post ICH with plans to repeat CT in 2-3 weeks. If ICH resolves plan was to add Eliquis to aspirin.Patient presently on  subcutaneous heparin since 4/13/2019Echocardiogramwith ejection fraction of 60% no wall motion abnormalities.Physical andOccupational therapy evaluationscompleted with recommendations of physical medicine rehab consult. Patient was admitted to CIR on 08/31/2017.  Pt presents with moderate receptive and expressive aphasia, which impacts the pt's overall functional communication and ability with functional tasks. Pt administered WAB-Bedside, and demonstrates deficits in written language and poor auditory comprehension with linguistically complex information. Pt also demonstrates paraphasias, perseveration, and word-finding difficulties, which contributes to decreased fluency and accuracy during spontaneous speech. However, pt exhibits intact ability with repetition, basic yes/no questions, and decoding at the paragraph level. Pt's cognitive function appeared WNL for all tasks assessed today. However, diagnostic treatment will be needed for ongoing assessment of functional and higher level tasks. Patient would benefit from skilled SLP intervention to maximize his functional communication and overall functional independence prior to discharge.     Skilled Therapeutic Interventions          Pt administered a cognitive-linguistic evaluation. Please see above for details. Educated patient in regards to his current deficits and goals of skilled SLP intervention. Pt verbalized understanding and agreement.     SLP Assessment  Patient will need skilled [facility] during CIR admission    Recommendations  Oral Care Recommendations: Oral care BID Recommendations for Other Services: Neuropsych consult Patient destination: Home Follow up Recommendations: Outpatient SLP;24 hour supervision/assistance Equipment Recommended: None recommended by SLP    SLP Frequency 3 to 5 out of 7 days   SLP Duration  SLP Intensity  SLP Treatment/Interventions 10-14 days  Minumum of 1-2 x/day, 30  to 90 minutes  Multimodal communication approach;Speech/Language facilitation;Cueing hierarchy;Functional tasks;Internal/external aids;Therapeutic Activities;Patient/family education    Pain Pain Assessment Pain Score: 0-No pain  Prior Functioning    Function:  Cognition Comprehension Comprehension assist level: Understands basic 50 - 74% of the time/ requires cueing 25 - 49% of the time  Expression   Expression assist level: Expresses basic 50 - 74% of the time/requires cueing 25 - 49% of the time. Needs to repeat parts of sentences.  Social Interaction Social Interaction assist level: Interacts appropriately 75 - 89% of the time - Needs redirection for appropriate language or to initiate interaction.  Problem Solving Problem solving assist level: Solves basic 75 - 89% of the time/requires cueing 10 - 24% of the time  Memory Memory assist level: Recognizes or recalls 75 - 89% of the time/requires cueing 10 - 24% of the time   Short Term Goals: Week 1: SLP Short Term Goal 1 (Week 1): Pt will follow 2-step directions during functional tasks with Min A verbal cues.  SLP Short Term Goal 2 (Week 1): Pt will demonstrate word-finding at the phrase level with Min A multimodal cues.  SLP Short Term Goal 3 (Week 1): Pt will self-monitor and correct verbal errors in 50% opportunities with Min A verbal cues.   Refer to Care Plan for Long Term Goals  Recommendations for other services: Neuropsych  Discharge Criteria: Patient will be discharged from SLP if patient refuses treatment 3 consecutive times without medical reason, if treatment goals not met, if there is a change in medical status, if patient makes no progress towards goals or if patient is discharged from hospital.  The above assessment, treatment plan, treatment alternatives and  goals were discussed and mutually agreed upon: by patient  Meredeth Ide  SLP - Student 09/01/2017, 4:03 PM

## 2017-09-01 NOTE — Progress Notes (Signed)
Social Work Patient ID: Eugene [name], male   DOB: 10-Oct-1940, 77 y.o.   MRN: [id number]  Met with pt and daughter to inform team conference goals supervision-mod/i level and target discharge 4/27. Speech has not yet evaluated pt will this afternoon. Will work toward discharge needs for next week. Daughter here to observe in therapies today.

## 2017-09-01 NOTE — Progress Notes (Signed)
Social Work  Social Work Assessment and Plan  Patient Details  Name: Eugene [name] MRN: [id number] Date of Birth: 1940-07-02  Today's Date: 09/01/2017  Problem List:  Patient Active Problem List   Diagnosis Date Noted  . 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 Hypertensive emergency 08/31/2017  . Sundowning 08/31/2017  . Hx of completed stroke 08/31/2017  . Thalamic hemorrhage ([place])   . Right hemiparesis ([place])   . Chronic anticoagulation   . Dysmetria   . Benign essential HTN   . Hyperlipidemia   . OSA (obstructive sleep apnea)   . 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 ICH (intracerebral hemorrhage) (HCC) - L thalamic d/t HTN in setting of Xarelto and plavix use 08/25/2017  . NSTEMI (non-ST elevated myocardial infarction) ([place])   . Diabetes mellitus type 2, noninsulin dependent ([place])   . Chest pain 06/24/2015  . Hyperglycemia 06/24/2015  . Pain in the chest   . Atrial fibrillation ([place]) 08/23/2009  . PLANTAR FASCIITIS, LEFT 12/31/2008  . TOBACCO ABUSE 04/10/2008  . REDNESS OR DISCHARGE OF EYE 03/09/2008  . RUQ PAIN 08/22/2007  . NEOPLASM, SKIN, UNCERTAIN BEHAVIOR 72/53/6644  . WEIGHT GAIN 03/17/2007  . SLEEP APNEA, OBSTRUCTIVE 09/22/2006  . MYOCARDIAL INFARCTION, HX OF 09/01/2006  . FATIGUE 09/01/2006  . HYPERLIPIDEMIA 06/22/2006  . ANXIETY 06/22/2006  . Depression 06/22/2006  . Hypertension 06/22/2006  . CAD (coronary artery disease) 06/22/2006  . BPH (benign prostatic hyperplasia) 06/22/2006   Past Medical History:  Past Medical History:  Diagnosis Date  . Anxiety   . Atrial fibrillation ([place])   . BPH (benign prostatic hypertrophy)   . Cataracts, both eyes   . Coronary artery disease   . Depressed   . Hyperlipidemia   . Hypertension   . ICH (intracerebral hemorrhage) ([place]) 08/2017  . MI (myocardial infarction) ([place])   . OSA (obstructive sleep apnea)   . Pre-diabetes    Past Surgical History:  Past Surgical History:   Procedure Laterality Date  . CARDIAC CATHETERIZATION N/A 06/26/2015   Procedure: Left Heart Cath and Coronary Angiography;  Surgeon: Burnell Blanks, MD;  Location: [facility];  Service: Cardiovascular;  Laterality: N/A;  . CARDIAC CATHETERIZATION  06/26/2015   Procedure: Coronary Stent Intervention;  Surgeon: Burnell Blanks, MD;  Location: [facility];  Service: Cardiovascular;;  . CARDIAC CATHETERIZATION  06/26/2015   Procedure: Coronary Balloon Angioplasty;  Surgeon: Burnell Blanks, MD;  Location: [facility];  Service: Cardiovascular;;  . CORONARY STENT PLACEMENT  10/2004   Social History:  reports that he has been smoking.  He has a 50.00 pack-year smoking history. He has never used smokeless tobacco. He reports that he does not drink alcohol or use drugs.  Family / Support Systems Marital Status: Divorced Patient Roles: Parent, Other (Comment)(friend) Children: Waylan Boga [phone]   Rick-son [phone] Other Supports: Lovell Sheehan  [phone] Anticipated Caregiver: Daughter and son Ability/Limitations of Caregiver: Daughter lives in [place] and son lives in [place], between both of them they can provide 24 hr care Caregiver Availability: 24/7 Family Dynamics: Close knit family pt and his children are very close and supportive of one another. Pt is one who is very independent and wants to get back to this level. He has made good progress which is encouraging to him.  Social History Preferred language: English Religion:  Cultural Background: No issues Education: High School Read: Yes Write: Yes Employment Status: Retired Theatre stage manager  Legal Issues: No issues Guardian/Conservator: None-according to MD pt is capable of making his own decisions while here. Daughter and son plan to be here daily also   Abuse/Neglect Abuse/Neglect Assessment Can Be Completed: Yes Physical Abuse: Denies Verbal  Abuse: Denies Sexual Abuse: Denies Exploitation of patient/patient's resources: Denies Self-Neglect: Denies  Emotional Status Pt's affect, behavior adn adjustment status: Pt is motivated to do well and regain his independence. Daughter voiced he has always been this way and will be hard to accept help from others. Both children plan to be there to assist and make sure he has what he needs at home. Recent Psychosocial Issues: health issues but were managed and he remained independent. Pyschiatric History: History of anxiety/depression take medications for this and finds them helpful. He may benefit from seeing neuro-psych while here. Will get input from team Substance Abuse History: Tobacco aware recommended to quit for health reasons. Unsure if plans to quit now or not  Patient / Family Perceptions, Expectations & Goals Pt/Family understanding of illness & functional limitations: Pt and daughter have a good understanding of his stroke and deficits. They have spoken with the MD and feel their questions and concerns are being addressed. Daughter plans to stay today to observe him in his therapies and provide support to him and ease with the transition. Premorbid pt/family roles/activities: Father, retiree, church member, grandfather, etc Anticipated changes in roles/activities/participation: resume Pt/family expectations/goals: Pt states: " I want to do for myself before I leave here."  Daughter states: " He is doing so much better and this is encouraging."  [employer]: None Premorbid Home Care/DME Agencies: None Transportation available at discharge: [employer] referrals recommended: Neuropsychology, Support group (specify)  Discharge Planning Living Arrangements: Alone Support Systems: Children, Other relatives, Water engineer, Social worker community Type of Residence: Private residence Insurance Resources: Multimedia programmer (specify)(Blue  Medicare) Financial Resources: Radio broadcast assistant Screen Referred: No Living Expenses: Own Money Management: Patient Does the patient have any problems obtaining your medications?: No Home Management: Self children to help now Patient/Family Preliminary Plans: Return home with a family member there with him for a short time. Between daughter or son someone will be there with him. He has a sister who also is supportive and involved. Doing well so will be a short length of stay here. Social Work Anticipated Follow Up Needs: HH/OP, Support Group  Clinical Impression Pleasant gentleman who is motivated to do well and recover from his stroke. He is already making good progress and is confident he will achieve his goal of mod/i.His two children are involved and will assist with his care at home. Aware target discharge date is 4/27 with supervision level goals. Work on discharge needs.  Elease Hashimoto 09/01/2017, 11:47 AM

## 2017-09-01 NOTE — Evaluation (Signed)
Occupational Therapy Assessment and Plan  Patient Details  Name: Eugene [name] MRN: [id number] Date of Birth: 07/15/1940  OT Diagnosis: ataxia, hemiplegia affecting dominant side and muscle weakness (generalized) Rehab Potential: Rehab Potential (ACUTE ONLY): Excellent ELOS: 10-14 days   Today's Date: 09/01/2017 OT Individual Time: 0945-1100 OT Individual Time Calculation (min): 75 min     Problem List:  Patient Active Problem List   Diagnosis Date Noted  . 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 Hypertensive emergency 08/31/2017  . Sundowning 08/31/2017  . Hx of completed stroke 08/31/2017  . Thalamic hemorrhage ([place])   . 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)   . Chronic anticoagulation   . Dysmetria   . Benign essential HTN   . Hyperlipidemia   . OSA (obstructive sleep apnea)   . 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 ICH (intracerebral hemorrhage) (HCC) - L thalamic d/t HTN in setting of Xarelto and plavix use 08/25/2017  . NSTEMI (non-ST elevated myocardial infarction) ([place])   . Diabetes mellitus type 2, noninsulin dependent ([place])   . Chest pain 06/24/2015  . Hyperglycemia 06/24/2015  . Pain in the chest   . Atrial fibrillation ([place]) 08/23/2009  . PLANTAR FASCIITIS, LEFT 12/31/2008  . TOBACCO ABUSE 04/10/2008  . REDNESS OR DISCHARGE OF EYE 03/09/2008  . RUQ PAIN 08/22/2007  . NEOPLASM, SKIN, UNCERTAIN BEHAVIOR 38/75/6433  . WEIGHT GAIN 03/17/2007  . SLEEP APNEA, OBSTRUCTIVE 09/22/2006  . MYOCARDIAL INFARCTION, HX OF 09/01/2006  . FATIGUE 09/01/2006  . HYPERLIPIDEMIA 06/22/2006  . ANXIETY 06/22/2006  . Depression 06/22/2006  . Hypertension 06/22/2006  . CAD (coronary artery disease) 06/22/2006  . BPH (benign prostatic hyperplasia) 06/22/2006    Past Medical History:  Past Medical History:  Diagnosis Date  . Anxiety   . Atrial fibrillation ([place])   . BPH (benign prostatic hypertrophy)   . Cataracts, both eyes   . Coronary artery disease   .  Depressed   . Hyperlipidemia   . Hypertension   . ICH (intracerebral hemorrhage) ([place]) 08/2017  . MI (myocardial infarction) ([place])   . OSA (obstructive sleep apnea)   . Pre-diabetes    Past Surgical History:  Past Surgical History:  Procedure Laterality Date  . CARDIAC CATHETERIZATION N/A 06/26/2015   Procedure: Left Heart Cath and Coronary Angiography;  Surgeon: Burnell Blanks, MD;  Location: [facility];  Service: Cardiovascular;  Laterality: N/A;  . CARDIAC CATHETERIZATION  06/26/2015   Procedure: Coronary Stent Intervention;  Surgeon: Burnell Blanks, MD;  Location: [facility];  Service: Cardiovascular;;  . CARDIAC CATHETERIZATION  06/26/2015   Procedure: Coronary Balloon Angioplasty;  Surgeon: Burnell Blanks, MD;  Location: [facility];  Service: Cardiovascular;;  . CORONARY STENT PLACEMENT  10/2004    Assessment & Plan Clinical Impression: Patient is a 77 y.o. year old male with recent admission to the hospital on  08/25/2017 with right-sided weakness and aphasia.  Blood pressure 157/98.  CT the head showed oval left thalamic hemorrhage with an estimated blood volume of 8 mL.  Mild surrounding edema and regional mass-effect.  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 Patient transferred to CIR  on 08/31/2017 .    Patient currently requires min /mod A with basic self-care skills secondary to muscle weakness, impaired timing and sequencing, unbalanced muscle activation, ataxia and decreased coordination, decreased attention to right, decreased attention, decreased awareness, decreased problem solving,  decreased safety awareness and decreased memory and decreased sitting balance, decreased standing balance, decreased postural control, hemiplegia and decreased balance strategies.  Prior to hospitalization, patient could complete BADL and iADL with independent .  Patient will benefit from skilled intervention to increase independence with basic self-care skills prior to discharge home with care partner.  Anticipate patient will require intermittent supervision and follow up home health.  OT - End of Session Endurance Deficit: Yes Endurance Deficit Description: Pt needed rest breaks within BADL tasks OT Assessment Rehab Potential (ACUTE ONLY): Excellent OT Patient demonstrates impairments in the following area(s): Balance;Cognition;Endurance;Motor;Perception;Sensory;Safety OT Basic ADL's Functional Problem(s): Eating;Grooming;Bathing;Dressing;Toileting OT Transfers Functional Problem(s): Toilet;Tub/Shower OT Additional Impairment(s): Fuctional Use of Upper Extremity OT Plan OT Intensity: Minimum of 1-2 x/day, 45 to 90 minutes OT Frequency: 5 out of 7 days OT Duration/Estimated Length of Stay: 10-14 days OT Treatment/Interventions: Cognitive remediation/compensation;Balance/vestibular training;Community reintegration;Discharge planning;DME/adaptive equipment instruction;Functional mobility training;Neuromuscular re-education;Patient/family education;Self Care/advanced ADL retraining;Splinting/orthotics;Therapeutic Activities;UE/LE Strength taining/ROM;Therapeutic Exercise;UE/LE Coordination activities OT Self Feeding Anticipated Outcome(s): Mod I OT Basic Self-Care Anticipated Outcome(s): Mod I OT Toileting Anticipated Outcome(s): Mod I OT Bathroom Transfers Anticipated Outcome(s): Supervision OT Recommendation Patient destination: Home Follow Up Recommendations: Home health OT Equipment Recommended: To be determined  Skilled Therapeutic Intervention Initial eval completed with treatment  provided to address functional transfers, functional use of R UE, improved sit<>stand, standing tolerance, and adapted bathing/dressing skills.  Stand-pivot transfers wc>shower chair with overall Min A. Incorporated R NMR weight bearing techniques within bathing tasks. Min A for balance when reaching to wash buttocks. LB/UB dressing completed wc level at the sink with assistance to thread L LE into clothing and assist to pull up pants. UB dressing with min A overall 2/2 R coordination deficits. Incorporate weight bearing through R UE when standing at the sink. Pt left seated in wc at end of session with safety belt on, call bell in reach, and daughter present.   OT Evaluation Precautions/Restrictions  Precautions Precautions: Fall Restrictions Weight Bearing Restrictions: No Pain  none/denies pain Home Living/Prior Functioning Home Living Family/patient expects to be discharged to:: Private residence Living Arrangements: Alone Available Help at Discharge: Available 24 hours/day Type of Home: House Home Access: Stairs to enter Technical brewer of Steps: 3 Entrance Stairs-Rails: None Home Layout: One level Bathroom Shower/Tub: Chiropodist: Standard  Lives With: Family IADL History Homemaking Responsibilities: Yes Current License: Yes IADL Comments: Pt reports he goes out to eat for all meals and does not cook Prior Function Level of Independence: Independent with homemaking with ambulation, Independent with basic ADLs  Able to Take Stairs?: Yes Driving: Yes Vocation: Retired Comments: retired Investment banker, corporate ADL ADL ADL Comments: Please see functional navigator Vision Baseline Vision/History: Wears glasses Wears Glasses: At all times Patient Visual Report: No change from baseline Perception  Perception: Impaired Inattention/Neglect: Does not attend to right side of body Praxis Praxis: Intact Cognition Overall Cognitive Status:  Impaired/Different from baseline Arousal/Alertness: Awake/alert Orientation Level: Person;Situation;Place Person: Oriented Place: Oriented(with choices 2/2 word finding difficulty) Situation: Oriented Year: Other (Comment)(2095- likely 2/2 word finding difficulty) Month: April Day of Week: Incorrect Memory: Impaired Immediate Memory Recall: Blue;Sock;Bed Memory Recall: Blue;Sock Memory Recall Sock: With Cue Memory Recall Blue: With Cue Awareness: Impaired Problem Solving:  Impaired Safety/Judgment: Impaired Sensation Sensation Light Touch: Impaired by gross assessment;Impaired Detail Light Touch Impaired Details: Impaired RUE Proprioception: Impaired by gross assessment Coordination Gross Motor Movements are Fluid and Coordinated: No Fine Motor Movements are Fluid and Coordinated: No Coordination and Movement Description: Mild dysmetria on R UE, movements slower and decreased accuracy  Finger Nose Finger Test: dysmetria  Heel Shin Test: dysmetric Bil, R>L Motor  Motor Motor: Hemiplegia;Ataxia Motor - Skilled Clinical Observations: R Hemiplegia UE>LE. mild Ataxia UE>LE Mobility  Bed Mobility Bed Mobility: Rolling Right;Rolling Left;Supine to Sit;Sit to Supine Rolling Right: 5: Supervision Rolling Right Details: Verbal cues for precautions/safety Rolling Left: 5: Supervision Rolling Left Details: Verbal cues for precautions/safety Supine to Sit: 5: Supervision Supine to Sit Details: Verbal cues for precautions/safety Sit to Supine: 5: Supervision Sit to Supine - Details: Verbal cues for precautions/safety Transfers Sit to Stand: 4: Min assist Sit to Stand Details: Verbal cues for technique;Verbal cues for precautions/safety  Trunk/Postural Assessment  Cervical Assessment Cervical Assessment: Exceptions to [facility] Thoracic Assessment Thoracic Assessment: Exceptions to [facility] Lumbar Assessment Lumbar Assessment: Exceptions to [facility] Postural Control Postural Control: Deficits  on evaluation  Balance Balance Balance Assessed: Yes Dynamic Sitting Balance Dynamic Sitting - Balance Support: During functional activity Dynamic Sitting - Level of Assistance: 5: Stand by assistance;4: Min assist Sitting balance - Comments: Lateral lean when reaching to LB Static Standing Balance Static Standing - Balance Support: During functional activity Static Standing - Level of Assistance: 4: Min assist Dynamic Standing Balance Dynamic Standing - Balance Support: During functional activity Dynamic Standing - Level of Assistance: 4: Min assist Extremity/Trunk Assessment RUE Assessment RUE Assessment: Exceptions to [facility] RUE Strength RUE Overall Strength Comments: 4-/5 overall  Right Shoulder Flexion: 4/5 LUE Assessment LUE Assessment: Within Functional Limits   See Function Navigator for Current Functional Status.   Refer to Care Plan for Long Term Goals  Recommendations for other services: None    Discharge Criteria: Patient will be discharged from OT if patient refuses treatment 3 consecutive times without medical reason, if treatment goals not met, if there is a change in medical status, if patient makes no progress towards goals or if patient is discharged from hospital.  The above assessment, treatment plan, treatment alternatives and goals were discussed and mutually agreed upon: by patient and by family  Valma Cava 09/01/2017, 12:46 PM

## 2017-09-01 NOTE — Evaluation (Signed)
Physical Therapy Assessment and Plan  Patient Details  Name: Eugene [name] MRN: [id number] Date of Birth: 04-05-1941  PT Diagnosis: Abnormality of gait, Coordination disorder, Hemiplegia dominant, Impaired cognition and Muscle weakness Rehab Potential: Good ELOS: 10-14   Today's Date: 09/01/2017 PT Individual Time: 9518-8416 PT Individual Time Calculation (min): 75 min    Problem List:  Patient Active Problem List   Diagnosis Date Noted  . 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 Hypertensive emergency 08/31/2017  . Sundowning 08/31/2017  . Hx of completed stroke 08/31/2017  . Thalamic hemorrhage ([place])   . Right hemiparesis ([place])   . Chronic anticoagulation   . Dysmetria   . Benign essential HTN   . Hyperlipidemia   . OSA (obstructive sleep apnea)   . 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 ICH (intracerebral hemorrhage) (HCC) - L thalamic d/t HTN in setting of Xarelto and plavix use 08/25/2017  . NSTEMI (non-ST elevated myocardial infarction) ([place])   . Diabetes mellitus type 2, noninsulin dependent ([place])   . Chest pain 06/24/2015  . Hyperglycemia 06/24/2015  . Pain in the chest   . Atrial fibrillation ([place]) 08/23/2009  . PLANTAR FASCIITIS, LEFT 12/31/2008  . TOBACCO ABUSE 04/10/2008  . REDNESS OR DISCHARGE OF EYE 03/09/2008  . RUQ PAIN 08/22/2007  . NEOPLASM, SKIN, UNCERTAIN BEHAVIOR 60/63/0160  . WEIGHT GAIN 03/17/2007  . SLEEP APNEA, OBSTRUCTIVE 09/22/2006  . MYOCARDIAL INFARCTION, HX OF 09/01/2006  . FATIGUE 09/01/2006  . HYPERLIPIDEMIA 06/22/2006  . ANXIETY 06/22/2006  . Depression 06/22/2006  . Hypertension 06/22/2006  . CAD (coronary artery disease) 06/22/2006  . BPH (benign prostatic hyperplasia) 06/22/2006    Past Medical History:  Past Medical History:  Diagnosis Date  . Anxiety   . Atrial fibrillation ([place])   . BPH (benign prostatic hypertrophy)   . Cataracts, both eyes   . Coronary artery disease   . Depressed   .  Hyperlipidemia   . Hypertension   . ICH (intracerebral hemorrhage) ([place]) 08/2017  . MI (myocardial infarction) ([place])   . OSA (obstructive sleep apnea)   . Pre-diabetes    Past Surgical History:  Past Surgical History:  Procedure Laterality Date  . CARDIAC CATHETERIZATION N/A 06/26/2015   Procedure: Left Heart Cath and Coronary Angiography;  Surgeon: Burnell Blanks, MD;  Location: [facility];  Service: Cardiovascular;  Laterality: N/A;  . CARDIAC CATHETERIZATION  06/26/2015   Procedure: Coronary Stent Intervention;  Surgeon: Burnell Blanks, MD;  Location: [place] [facility];  Service: Cardiovascular;;  . CARDIAC CATHETERIZATION  06/26/2015   Procedure: Coronary Balloon Angioplasty;  Surgeon: Burnell Blanks, MD;  Location: [place] [facility];  Service: Cardiovascular;;  . CORONARY STENT PLACEMENT  10/2004    Assessment & Plan Clinical Impression: Patient is a 77 y.o.right-handed 77malewith history ofprediabetes, hypertension, hyperlipidemia, tobacco abuse and atrial fibrillation maintained on Xarelto and question medical compliance. Per chart review patient lives alone independent and driving as well as active. One level home. Presented 08/25/2017 with right-sided weakness and aphasia. Blood pressure 157/98. CT the head showed oval left thalamic hemorrhage with an estimated blood volume of 8 mL. Mild surrounding edema and regional mass-effect. 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 High-grade stenosis  or occlusion of the left superior cerebellar artery. Neurology follow-up currently plan is for aspirin 81 mg in 5-7 days of neuro stable post ICH with plans to repeat CT in 2-3 weeks. If ICH  resolves plan was to add Eliquis to aspirin.Patient presently on subcutaneous heparin since 4/13/2019Echocardiogramwith ejection fraction of 60% no wall motion abnormalities.   Patient transferred to CIR on 08/31/2017 .   Patient currently requires mod with mobility secondary to muscle weakness, decreased cardiorespiratoy endurance, impaired timing and sequencing, ataxia and decreased coordination, decreased attention to right, decreased awareness, decreased problem solving, decreased safety awareness, decreased memory and delayed processing and decreased standing balance, decreased postural control, hemiplegia and decreased balance strategies.  Prior to hospitalization, patient was modified independent  with mobility and lived with Family in a House home.  Home access is 3Stairs to enter.  Patient will benefit from skilled PT intervention to maximize safe functional mobility, minimize fall risk and decrease caregiver burden for planned discharge home with 24 hour supervision.  Anticipate patient will benefit from follow up [place] at discharge.  PT - End of Session Activity Tolerance: Tolerates 10 - 20 min activity with multiple rests PT Assessment Rehab Potential (ACUTE/IP ONLY): Good PT Barriers to Discharge: [place] home environment;Home environment access/layout;Insurance for SNF coverage PT Patient demonstrates impairments in the following area(s): Balance;Behavior;Endurance;Motor;Perception;Sensory;Skin Integrity;Safety PT Transfers Functional Problem(s): Bed Mobility;Bed to Chair;Car;Furniture;Floor PT Locomotion Functional Problem(s): Ambulation;Wheelchair Mobility;Stairs PT Plan PT Intensity: Minimum of 1-2 x/day ,45 to 90 minutes PT Frequency: 5 out of 7 days PT Duration Estimated Length of Stay: 10-14 PT Treatment/Interventions: Ambulation/gait training;Balance/vestibular training;Cognitive remediation/compensation;Disease management/prevention;Discharge planning;Community  reintegration;DME/adaptive equipment instruction;Functional electrical stimulation;Pain management;Neuromuscular re-education;Psychosocial support;Splinting/orthotics;Patient/family education;Functional mobility training;Skin care/wound management;Therapeutic Exercise;Therapeutic Activities;Stair training;UE/LE Strength taining/ROM;UE/LE Coordination activities;Visual/perceptual remediation/compensation;Wheelchair propulsion/positioning PT Transfers Anticipated Outcome(s): Supervision assist with LRAD  PT Locomotion Anticipated Outcome(s): Supervision assist for ambulation up to 142f with LRAD  PT Recommendation Recommendations for Other Services: Therapeutic Recreation consult Therapeutic Recreation Interventions: Stress management Follow Up Recommendations: Home health PT Patient destination: Home Equipment Recommended: Rolling walker with 5" wheels;Wheelchair cushion (measurements);Wheelchair (measurements)  Skilled Therapeutic Intervention Pt received supine in bed and agreeable to PT. Supine>sit transfer with supervision assist and min cues. PT instructed patient in PT Evaluation and initiated treatment intervention; see below for details . PT educated patient in [place] rehab potential, rehab goals, and discharge recommendations. Pt reports incontinent bowel movement. Toilet transfer with min assist and PT required to perform pericare due to coordination deficits in the R UE. Patient returned to room and [facility] and all needs met.       PT Evaluation Precautions/Restrictions   fall. Ataxia  General   Vital SignsTherapy Vitals Temp: 98.5 F (36.9 C) Temp Source: Oral Pulse Rate: 79 Resp: 18 BP: (!) 144/106 Patient Position (if appropriate): Lying Oxygen Therapy SpO2: 99 % O2 Device: Room Air Pain   denies  Home Living/Prior Functioning Home Living Available Help at Discharge: Available 24 hours/day Type of Home: House Home Access: Stairs to  enter ECenterPoint Energyof Steps: 3 Entrance Stairs-Rails: None(Plans to install rail) Home Layout: One level Bathroom Shower/Tub: TChiropodist Standard  Lives With: Family Prior Function Level of Independence: Independent with basic ADLs;Independent with homemaking with ambulation;Independent with homemaking with wheelchair  Able to Take Stairs?: Yes Driving: Yes Vocation: Retired Comments: retired IInvestment banker, 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  Perception Perception: Impaired Praxis Praxis: Intact  Cognition Overall Cognitive Status: Impaired/Different from baseline Orientation Level:  Oriented to person;Oriented to place;Oriented to situation Memory: Impaired Awareness: Impaired Problem Solving: Impaired Safety/Judgment: Impaired Sensation Sensation Light Touch: Impaired by gross assessment Proprioception: Impaired by gross assessment Coordination Gross Motor Movements are Fluid and Coordinated: No Fine Motor Movements are Fluid and Coordinated: No Coordination and Movement Description: mild dysmetria on the R, UE.LE Heel Shin Test: dysmetric Bil, R>L Motor  Motor Motor: Hemiplegia;Ataxia Motor - Skilled Clinical Observations: R Hemiplegia UE>LE. mild Ataxia UE>LE  Mobility Bed Mobility Bed Mobility: Rolling Right;Rolling Left;Supine to Sit;Sit to Supine Rolling Right: 5: Supervision Rolling Right Details: Verbal cues for precautions/safety Rolling Left: 5: Supervision Rolling Left Details: Verbal cues for precautions/safety Supine to Sit: 5: Supervision Supine to Sit Details: Verbal cues for precautions/safety Sit to Supine: 5: Supervision Sit to Supine - Details: Verbal cues for precautions/safety Transfers Transfers: Yes Sit to Stand: 4: Min assist Sit to Stand Details: Verbal cues for technique;Verbal cues for precautions/safety Stand Pivot Transfers: 4: Min assist Stand Pivot Transfer Details: Manual facilitation for weight shifting;Manual  facilitation for placement;Verbal cues for safe use of DME/AE;Verbal cues for technique;Verbal cues for precautions/safety Locomotion  Ambulation Ambulation: Yes Ambulation/Gait Assistance: 3: Mod assist;4: Min assist Ambulation Distance (Feet): 70 Feet Assistive device: None Ambulation/Gait Assistance Details: Verbal cues for gait pattern;Verbal cues for precautions/safety;Verbal cues for technique;Manual facilitation for weight shifting Gait Gait: Yes Gait Pattern: Impaired Gait Pattern: Ataxic;Wide base of support;Right flexed knee in stance;Left flexed knee in stance Stairs / Additional Locomotion Stairs: Yes Stairs Assistance: 4: Min assist Stairs Assistance Details: Verbal cues for precautions/safety;Verbal cues for technique;Verbal cues for gait pattern;Verbal cues for safe use of DME/AE;Manual facilitation for placement Stairs Assistance Details (indicate cue type and reason): Pt noted to have decreased attention to the R UE and LE.  Stair Management Technique: Two rails Number of Stairs: 12 Wheelchair Mobility Wheelchair Mobility: Yes Wheelchair Assistance: 4: Advertising account executive Details: Tactile cues for placement;Verbal cues for technique;Verbal cues for Information systems manager: Both upper extremities Wheelchair Parts Management: Needs assistance Distance: 167f  Trunk/Postural Assessment  Cervical Assessment Cervical Assessment: Exceptions to [facility]University [facility] HospitalThoracic Assessment Thoracic Assessment: Exceptions to WSt. [facility] StocktonLumbar Assessment Lumbar Assessment: Exceptions to WFroedtert Surgery Center LLCPostural Control Postural Control: Deficits on evaluation  Balance Balance Balance Assessed: Yes Dynamic Sitting Balance Dynamic Sitting - Balance Support: During functional activity Dynamic Sitting - Level of Assistance: 5: Stand by assistance;4: Min assist Sitting balance - Comments: Lateral lean when reaching to LB Static Standing Balance Static Standing - Balance Support:  During functional activity Static Standing - Level of Assistance: 4: Min assist Dynamic Standing Balance Dynamic Standing - Balance Support: During functional activity Dynamic Standing - Level of Assistance: 4: Min assist  Extremity Assessment      RLE Assessment RLE Assessment: Exceptions to WSouthwestern Medical CenterRLE Strength RLE Overall Strength Comments: 4/5 hip flexion and knee flexion. all others tested 5/5. with delayed initiation LLE Assessment LLE Assessment: Within Functional Limits   See Function Navigator for Current Functional Status.   Refer to Care Plan for Long Term Goals  Recommendations for other services: Therapeutic Recreation  Stress management  Discharge Criteria: Patient will be discharged from PT if patient refuses treatment 3 consecutive times without medical reason, if treatment goals not met, if there is a change in medical status, if patient makes no progress towards goals or if patient is discharged from hospital.  The above assessment, treatment plan, treatment alternatives and goals were discussed and mutually agreed upon: by patient  ALorie Phenix4/17/2019, 9:56  AM  

## 2017-09-01 NOTE — Progress Notes (Signed)
Subjective/Complaints: Has word finding issues , no complaint overnite, BM this am  ROS  Difficult to ellicit due to aphasia  Objective: Vital Signs: Blood pressure (!) 144/106, pulse 79, temperature 98.5 F (36.9 C), temperature source Oral, resp. rate 18, height 6' (1.829 m), weight 84.7 kg (186 lb 11.7 oz), SpO2 99 %. No results found. Results for orders placed or performed during the hospital encounter of 08/31/17 (from the past 72 hour(s))  CBC     Status: Abnormal   Collection Time: 08/31/17  3:16 PM  Result Value Ref Range   WBC 10.9 (H) 4.0 - 10.5 K/uL   RBC 5.11 4.22 - 5.81 MIL/uL   Hemoglobin 15.5 13.0 - 17.0 g/dL   HCT 46.3 39.0 - 52.0 %   MCV 90.6 78.0 - 100.0 fL   MCH 30.3 26.0 - 34.0 pg   MCHC 33.5 30.0 - 36.0 g/dL   RDW 14.4 11.5 - 15.5 %   Platelets 220 150 - 400 K/uL    Comment: Performed at [facility] [address] [place], [place] [address]  Creatinine, serum     Status: Abnormal   Collection Time: 08/31/17  3:16 PM  Result Value Ref Range   Creatinine, Ser 1.33 (H) 0.61 - 1.24 mg/dL   GFR calc non Af Amer 50 (L) >60 mL/min   GFR calc Af Amer 57 (L) >60 mL/min    Comment: (NOTE) The eGFR has been calculated using the CKD EPI equation. This calculation has not been validated in all clinical situations. eGFR's persistently <60 mL/min signify possible Chronic Kidney Disease. Performed at [facility], [place] [address], [place], [place] [address]   Glucose, capillary     Status: None   Collection Time: 08/31/17  4:44 PM  Result Value Ref Range   Glucose-Capillary 91 65 - 99 mg/dL  Glucose, capillary     Status: Abnormal   Collection Time: 08/31/17  9:44 PM  Result Value Ref Range   Glucose-Capillary 107 (H) 65 - 99 mg/dL  CBC WITH DIFFERENTIAL     Status: Abnormal   Collection Time: 09/01/17  5:53 AM  Result Value Ref Range   WBC 8.9 4.0 - 10.5 K/uL   RBC 5.28 4.22 - 5.81 MIL/uL   Hemoglobin 15.5 13.0 - 17.0 g/dL   HCT 48.1 39.0 -  52.0 %   MCV 91.1 78.0 - 100.0 fL   MCH 29.4 26.0 - 34.0 pg   MCHC 32.2 30.0 - 36.0 g/dL   RDW 14.7 11.5 - 15.5 %   Platelets 187 150 - 400 K/uL   Neutrophils Relative % 57 %   Neutro Abs 5.0 1.7 - 7.7 K/uL   Lymphocytes Relative 29 %   Lymphs Abs 2.6 0.7 - 4.0 K/uL   Monocytes Relative 12 %   Monocytes Absolute 1.1 (H) 0.1 - 1.0 K/uL   Eosinophils Relative 2 %   Eosinophils Absolute 0.2 0.0 - 0.7 K/uL   Basophils Relative 0 %   Basophils Absolute 0.0 0.0 - 0.1 K/uL    Comment: Performed at [facility], [place]. [address] [place], [place] [address]  Comprehensive metabolic panel     Status: Abnormal   Collection Time: 09/01/17  5:53 AM  Result Value Ref Range   Sodium 138 135 - 145 mmol/L   Potassium 3.8 3.5 - 5.1 mmol/L   Chloride 109 101 - 111 mmol/L   CO2 20 (L) 22 - 32 mmol/L   Glucose, Bld 98  65 - 99 mg/dL   BUN 27 (H) 6 - 20 mg/dL   Creatinine, Ser 1.23 0.61 - 1.24 mg/dL   Calcium 9.0 8.9 - 10.3 mg/dL   Total Protein 6.6 6.5 - 8.1 g/dL   Albumin 3.4 (L) 3.5 - 5.0 g/dL   AST 25 15 - 41 U/L   ALT 33 17 - 63 U/L   Alkaline Phosphatase 69 38 - 126 U/L   Total Bilirubin 1.0 0.3 - 1.2 mg/dL   GFR calc non Af Amer 54 (L) >60 mL/min   GFR calc Af Amer >60 >60 mL/min    Comment: (NOTE) The eGFR has been calculated using the CKD EPI equation. This calculation has not been validated in all clinical situations. eGFR's persistently <60 mL/min signify possible Chronic Kidney Disease.    Anion gap 9 5 - 15    Comment: Performed at [facility] [address] [place], [place] [address]  Glucose, capillary     Status: None   Collection Time: 09/01/17  7:57 AM  Result Value Ref Range   Glucose-Capillary 94 65 - 99 mg/dL     HEENT: poor dentition Cardio: irregular and no murmur Resp: CTA B/L and unlabored GI: BS positive and NT, ND Extremity:  No Edema Skin:   Intact Neuro: Abnormal Sensory limited by aphasia, Abnormal Motor 4/5 R delt , bi,tri, grip, HF, KE  ,ADF, 5/5 on Left, Dysarthric and Aphasic Musc/Skel:  Other no pain with UE or LE ROM Gen NAD   Assessment/Plan: 1. 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 Physiatrist is providing close team supervision and 24 hour management of active medical problems listed below. Physiatrist and rehab team continue to assess barriers to discharge/monitor patient progress toward functional and medical goals. FIM:                                  Medical Problem List and Plan: 1.Right sided weakness and aphasiasecondary to left thalamic ICH. Plan follow-up CT 2-3 weeks if ICH resolves add Eliquis to aspirin secondary to history of atrial fibrillation -CIR eval today 2. DVT Prophylaxis/Anticoagulation: Subcutaneous heparin initiated 08/28/2017 3. Pain Management:Tylenol as needed 4. Mood:Paxil 20 mg daily, Seroquel 25 mg nightly Xanax 0.5 mg 3 times daily as needed 5. Neuropsych: This patientiscapable of making decisions on hisown behalf. 6. Skin/Wound Care:Routine skin checks 7. Fluids/Electrolytes/Nutrition:Routine I&O's with follow-up chemistriesupon admit, BMET reviewed no signs of renal insuff, monitor po fluid intake 8.Atrial fibrillation. Aspirin to be initiated 81 mg 09/01/2017. Cardiac rate controlled 9.Hypertension. Norvasc 10 mg daily, lisinopril 20 mg daily, Lopressor 12.5 mg twice daily Vitals:   08/31/17 2051 09/01/17 0635  BP: 114/64 (!) 144/106  Pulse: (!) 53 79  Resp:  18  Temp:  98.5 F (36.9 C)  SpO2:  99%  monitor BP for trend 4/17 10.Prediabetic. SSI. Check blood sugars before meals and at bedtime 11.BPH. Proscar 5 mg daily. Check PVR x3 12.Hyperlipidemia. Crestor    LOS (Days) 1 A FACE TO FACE EVALUATION WAS PERFORMED  Charlett Blake 09/01/2017, 8:39 AM

## 2017-09-01 NOTE — Care Management Note (Signed)
[facility] Individual Statement of Services  Patient Name:  Eugene [name]  Date:  09/01/2017  Welcome to the [facility].  Our goal is to provide you with an individualized program based on your diagnosis and situation, designed to meet your specific needs.  With this comprehensive rehabilitation program, you will be expected to participate in at least 3 hours of rehabilitation therapies Monday-Friday, with modified therapy programming on the weekends.  Your rehabilitation program will include the following services:  Physical Therapy (PT), Occupational Therapy (OT), Speech Therapy (ST), 24 hour per day rehabilitation nursing, Therapeutic Recreaction (TR), Neuropsychology, Case Management (Social Worker), Rehabilitation Medicine, Nutrition Services and Pharmacy Services  Weekly team conferences will be held on Wednesday to discuss your progress.  Your Social Worker will talk with you frequently to get your input and to update you on team discussions.  Team conferences with you and your family in attendance may also be held.  Expected length of stay: 10-14 days  Overall anticipated outcome: supervision-mod/i level  Depending on your progress and recovery, your program may change. Your Social Worker will coordinate services and will keep you informed of any changes. Your Social Worker's name and contact numbers are listed  below.  The following services may also be recommended but are not provided by the [facility] will be made to provide these services after discharge if needed.  Arrangements include referral to agencies that provide these services.  Your insurance has been verified to be:  Eugene [name] Your primary doctor is:  Jenna Luo  Pertinent information will be shared with your doctor and your  insurance company.  Social Worker:  Ovidio Kin, [place] or ([phone]  Information discussed with and copy given to patient by: Elease Hashimoto, 09/01/2017, 10:03 AM

## 2017-09-01 NOTE — Patient Care Conference (Signed)
Inpatient RehabilitationTeam Conference and Plan of Care Update Date: 09/01/2017   Time: 11:20 AM    Patient Name: Eugene [name]      Medical Record Number: [id number]  Date of Birth: Feb 15, 1941 Sex: Male         Room/Bed: 4W21C/4W21C-01 Payor Info: Payor: [place] / Plan: BCBS MEDICARE / Product Type: *No Product type* /    Admitting Diagnosis: CVA  Admit Date/Time:  08/31/2017  2:46 PM Admission Comments: No comment available   Primary Diagnosis:  <principal problem not specified> Principal Problem: <principal problem not specified>  Patient Active Problem List   Diagnosis Date Noted  . 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 Hypertensive emergency 08/31/2017  . Sundowning 08/31/2017  . Hx of completed stroke 08/31/2017  . Thalamic hemorrhage ([facility])   . 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 Chronic anticoagulation   . Dysmetria   . Benign essential HTN   . Hyperlipidemia   . OSA (obstructive sleep apnea)   . 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 ICH (intracerebral hemorrhage) (HCC) - L thalamic d/t HTN in setting of Xarelto and plavix use 08/25/2017  . NSTEMI (non-ST elevated myocardial infarction) ([facility])   . Diabetes mellitus type 2, noninsulin dependent ([facility])   . Chest pain 06/24/2015  . Hyperglycemia 06/24/2015  . Pain in the chest   . Atrial fibrillation ([facility]) 08/23/2009  . PLANTAR FASCIITIS, LEFT 12/31/2008  . TOBACCO ABUSE 04/10/2008  . REDNESS OR DISCHARGE OF EYE 03/09/2008  . RUQ PAIN 08/22/2007  . NEOPLASM, SKIN, UNCERTAIN BEHAVIOR 12/45/8099  . WEIGHT GAIN 03/17/2007  . SLEEP APNEA, OBSTRUCTIVE 09/22/2006  . MYOCARDIAL INFARCTION, HX OF 09/01/2006  . FATIGUE 09/01/2006  . HYPERLIPIDEMIA 06/22/2006  . ANXIETY 06/22/2006  . Depression 06/22/2006  . Hypertension 06/22/2006  . CAD (coronary artery disease) 06/22/2006  . BPH (benign prostatic hyperplasia) 06/22/2006    Expected Discharge Date: Expected Discharge Date:  09/11/17  Team Members Present: Physician leading conference: Dr. Alysia Penna Social Worker Present: Ovidio Kin, LCSW Nurse Present: Dorthula Nettles, RN PT Present: Barrie Folk, PT OT Present: Cherylynn Ridges, OT SLP Present: Weston Anna, SLP PPS Coordinator present : Daiva Nakayama, RN, CRRN     Current Status/Progress Goal Weekly Team Focus  Medical   incont bowel, RIght hemiparesis  Maintain medical stability  initial evals, establish bowel cont   Bowel/Bladder   cont bowel/incont bladder; lbm 4/15;  continent bladder with mod assist  timed toileting   Swallow/Nutrition/ Hydration             ADL's   Min A overall for BADLs with lots of verbal cues  Mod I/supervision  R ataxia, hemi sensory deficits, modified bathing/dressing, standing balance   Mobility     eval pending        Communication   Eval Pending          Safety/Cognition/ Behavioral Observations  Eval Pending          Pain   denies pain  <3 out of 10  assess q shift and prn   Skin   edematous site with redness to r knee; R 5th toe inflamed; dry, brown flaky skin to bilat lower shin/ankle; scattered ecchymosis; R knee abrasion  free from infection/breakdown during rehab stay  assess q shift and prn      *See Care Plan and progress notes for long and short-term goals.     Barriers to Discharge  Current  Status/Progress Possible Resolutions Date Resolved   Physician    Decreased caregiver support;Medical stability;Incontinence     initiating evals  Cont rehab      Nursing                  PT  Inaccessible home environment;Home environment access/layout;Insurance for SNF coverage                 OT                  SLP                SW                Discharge Planning/Teaching Needs:    Home with son or daughter there to provide supervision level for a short time.     Team Discussion:  Being evaluated today-goals supervision-mod/i level goals. Children to pace rail on his stairs at  home. Impulsive and has safety issues at times. Had some incontinent episodes today nursing to work on this.  Revisions to Treatment Plan:  DC 4/27    Continued Need for Acute Rehabilitation Level of Care: The patient requires daily medical management by a physician with specialized training in physical medicine and rehabilitation for the following conditions: Daily direction of a multidisciplinary physical rehabilitation program to ensure safe treatment while eliciting the highest outcome that is of practical value to the patient.: Yes Daily medical management of patient stability for increased activity during participation in an intensive rehabilitation regime.: Yes Daily analysis of laboratory values and/or radiology reports with any subsequent need for medication adjustment of medical intervention for : Neurological problems;Other  Elease Hashimoto 09/01/2017, 11:54 AM

## 2017-09-02 ENCOUNTER — Inpatient Hospital Stay (HOSPITAL_COMMUNITY): Payer: Medicare Other | Admitting: Physical Therapy

## 2017-09-02 ENCOUNTER — Inpatient Hospital Stay (HOSPITAL_COMMUNITY): Payer: Medicare Other | Admitting: Speech Pathology

## 2017-09-02 ENCOUNTER — Inpatient Hospital Stay (HOSPITAL_COMMUNITY): Payer: Medicare Other | Admitting: Occupational Therapy

## 2017-09-02 LAB — GLUCOSE, CAPILLARY
GLUCOSE-CAPILLARY: 101 mg/dL — AB (ref 65–99)
GLUCOSE-CAPILLARY: 83 mg/dL (ref 65–99)
Glucose-Capillary: 103 mg/dL — ABNORMAL HIGH (ref 65–99)
Glucose-Capillary: 124 mg/dL — ABNORMAL HIGH (ref 65–99)

## 2017-09-02 NOTE — Progress Notes (Signed)
Occupational Therapy Session Note  Patient Details  Name: Eugene [name] MRN: [id number] Date of Birth: 1940/10/30  Today's Date: 09/02/2017 OT Individual Time: 0800-0900 OT Individual Time Calculation (min): 60 min   Short Term Goals: Week 1:  OT Short Term Goal 1 (Week 1): Pt will complete LB dressing with supervision OT Short Term Goal 2 (Week 1): Pt will tolerate standing at the sink in preparation for BADL tasks for 3 mins OT Short Term Goal 3 (Week 1): Pt will integrate R UE within bathing tasks with min cues 75% of the time  Skilled Therapeutic Interventions/Progress Updates:    Pt greeted semi-reclined in bed and agreeable to OT. Pt needed instructional cues for stand-pivot transfers bed>wc>shower chair>wc with overall min physical assist. Bathing completed at shower level with min/mod cues to integrate R UE into bathing tasks for neuro re-ed. Min guard for standing balance when reaching to wash buttocks. Pt needed instructional cues for sequencing and initiation to don shirt today as pt confused about where R UE goes and "what to do next." Worked on standing balance/endurance with standing grooming tasks of toothbrushing and shaving. Pt needed 2 seated rest breaks during shaving task 2/2 fatigue. Overall min guard A for balance when standing at the sink. Pt tolerated standing for 4 mins at longest bout. Pt brought to dynavision room and worked on R attention, R visual scanning, and functional use of R UE. Pt's initial reaction time on R side was 8.46, then with practice and repetition, pt decreased reaction time to 4.13. Pt needed mod cues to locate lights on R side. Pt returned to room and left seated in wc with safety belt and chair alarm on, needs met.   Therapy Documentation Precautions:  Precautions Precautions: Fall Restrictions Weight Bearing Restrictions: No Pain: Pain Assessment Pain Scale: 0-10 Pain Score: 0-No pain ADL: ADL ADL Comments: Please see functional  navigator  See Function Navigator for Current Functional Status.   Therapy/Group: Individual Therapy  Valma Cava 09/02/2017, 9:00 AM

## 2017-09-02 NOTE — Plan of Care (Signed)
  Problem: Consults Goal: RH STROKE PATIENT EDUCATION Description See Patient Education module for education specifics  Outcome: Progressing   Problem: RH BLADDER ELIMINATION Goal: RH STG MANAGE BLADDER WITH MEDICATION WITH ASSISTANCE Description STG Manage Bladder With Medication With ModI Assistance.  Outcome: Progressing   Problem: RH SKIN INTEGRITY Goal: RH STG MAINTAIN SKIN INTEGRITY WITH ASSISTANCE Description STG Maintain Skin Integrity With Cues/ Assistance.  Outcome: Progressing   Problem: RH SAFETY Goal: RH STG ADHERE TO SAFETY PRECAUTIONS W/ASSISTANCE/DEVICE Description STG Adhere to Safety Precautions With Reminders or Cues.  Outcome: Progressing   Problem: RH KNOWLEDGE DEFICIT Goal: RH STG INCREASE KNOWLEDGE OF DIABETES Description Pt. Will demonstrate understanding of diabetes management using cues/reminders independently. Pt. Will be able to complete CBG with min assist.  Outcome: Progressing Goal: RH STG INCREASE KNOWLEDGE OF HYPERTENSION Outcome: Progressing   Problem: RH BLADDER ELIMINATION Goal: RH STG MANAGE BLADDER WITH ASSISTANCE Description STG Manage Bladder With Min Assistance  Outcome: Not Progressing

## 2017-09-02 NOTE — Progress Notes (Signed)
Speech Language Pathology Daily Session Note  Patient Details  Name: Eugene [name] MRN: [id number] Date of Birth: 08/28/1940  Today's Date: 09/02/2017 SLP Individual Time: 1330-1430 SLP Individual Time Calculation (min): 60 min  Short Term Goals: Week 1: SLP Short Term Goal 1 (Week 1): Pt will follow 2-step directions during functional tasks with Min A verbal cues.  SLP Short Term Goal 2 (Week 1): Pt will demonstrate word-finding at the phrase level with Min A multimodal cues.  SLP Short Term Goal 3 (Week 1): Pt will self-monitor and correct verbal errors in 50% opportunities with Min A verbal cues.   Skilled Therapeutic Interventions: Skilled treatment session focused on communication goals. SLP facilitated session by providing Mod A multimodal cues for word-finding at the phrase level during a basic picture description task and Max A verbal cues for word-finding at the sentence level during a verbal description task ([place]). Pt able to self-monitor verbal errors in 65% opportunities, however, required Mod A verbal cues to correct them. At end of session, pt requested to use the bathroom. SLP transferred pt to commode and pt able to successfully void. During task, pt able to follow basic 1-step directions with Supervision verbal cues. Pt left upright in wheelchair with quick release belt on and all needs within reach. Continue with current plan of care.      Function:  Cognition Comprehension Comprehension assist level: Understands basic 50 - 74% of the time/ requires cueing 25 - 49% of the time  Expression   Expression assist level: Expresses basic 50 - 74% of the time/requires cueing 25 - 49% of the time. Needs to repeat parts of sentences.  Social Interaction Social Interaction assist level: Interacts appropriately 75 - 89% of the time - Needs redirection for appropriate language or to initiate interaction.  Problem Solving Problem solving assist level: Solves basic 75 - 89% of the  time/requires cueing 10 - 24% of the time  Memory Memory assist level: Recognizes or recalls 75 - 89% of the time/requires cueing 10 - 24% of the time    Pain Pain Assessment Pain Score: 0-No pain  Therapy/Group: Individual Therapy  Meredeth Ide  SLP - Student 09/02/2017, 3:17 PM

## 2017-09-02 NOTE — Progress Notes (Signed)
Physical Therapy Session Note  Patient Details  Name: Eugene [name] MRN: [id number] Date of Birth: 1940-10-14  Today's Date: 09/02/2017 PT Individual Time: 1000-1100 AND 1300-1330 PT Individual Time Calculation (min): 60 min AND 30 min  Short Term Goals: Week 1:  PT Short Term Goal 1 (Week 1): STG =LTG due to ELOS   Skilled Therapeutic Interventions/Progress Updates:   Session 1:  Pt in w/c and agreeable to therapy, denies pain. Session focused on functional mobility, balance, and pt/caregiver education. Son present at beginning of session asking questions regarding discharge date, has concerns that the date is "set-in-stone". Educated son CIR policies, typical LOS for pt's diagnosis and CLOF, and importance of having an estimated d/c date so family and caregivers can plan accordingly. Son verbalized understanding. Pt ambulated around unit in 75-150' bouts w/ min assist for balance using RW and occasional manual assist for RW management. Frequent verbal cues for RW management. Performed car transfer w/ min assist at level of son's SUV. Performed Berg Balance Scale as detailed below and explained significance of results to both son and pt. Pt is at a high fall risk at this time. Returned to room and ended session in w/c and in care of son, all needs met.   Session 2:  Pt in supine and agreeable to therapy, no c/o pain. Ambulated to/from day room w/ min guard using RW to work on functional independence and participated in bimanual UE task at high/low table. Emphasized maintaining balance while utilizing both hands for carryover to functional standing tasks including meal management and ADLs. Min guard for safety and verbal cues for balance strategies. Occasional brief seated rest 2/2 fatigue. Handed off to speech therapist at end of session.   Therapy Documentation Precautions:  Precautions Precautions: Fall Restrictions Weight Bearing Restrictions: No Pain: Pain Assessment Pain Scale:  0-10 Pain Score: 0-No pain Balance: Standardized Balance Assessment Standardized Balance Assessment: Berg Balance Test Berg Balance Test Sit to Stand: Able to stand  independently using hands Standing Unsupported: Able to stand 2 minutes with supervision Sitting with Back Unsupported but Feet Supported on Floor or Stool: Able to sit safely and securely 2 minutes Stand to Sit: Sits safely with minimal use of hands Transfers: Able to transfer safely, definite need of hands Standing Unsupported with Eyes Closed: Able to stand 10 seconds with supervision Standing Ubsupported with Feet Together: Able to place feet together independently but unable to hold for 30 seconds From Standing, Reach Forward with Outstretched Arm: Reaches forward but needs supervision From Standing Position, Pick up Object from Floor: Unable to try/needs assist to keep balance From Standing Position, Turn to Look Behind Over each Shoulder: Needs supervision when turning Turn 360 Degrees: Needs close supervision or verbal cueing Standing Unsupported, Alternately Place Feet on Step/Stool: Able to complete >2 steps/needs minimal assist Standing Unsupported, One Foot in Front: Needs help to step but can hold 15 seconds Standing on One Leg: Unable to try or needs assist to prevent fall Total Score: 27  See Function Navigator for Current Functional Status.   Therapy/Group: Individual Therapy  Zanaiya Calabria K Arnette 09/02/2017, 11:30 AM

## 2017-09-02 NOTE — Progress Notes (Signed)
Subjective/Complaints: Has word finding issues ,pt states face hurts but points to Left clavicle.  Denies pain with nexck or shoulder movement  ROS  Difficult to ellicit due to aphasia  Objective: Vital Signs: Blood pressure (!) 150/90, pulse 70, temperature 98.9 F (37.2 C), temperature source Oral, resp. rate 16, height 6' (1.829 m), weight 85 kg (187 lb 6.3 oz), SpO2 99 %. No results found. Results for orders placed or performed during the hospital encounter of 08/31/17 (from the past 72 hour(s))  CBC     Status: Abnormal   Collection Time: 08/31/17  3:16 PM  Result Value Ref Range   WBC 10.9 (H) 4.0 - 10.5 K/uL   RBC 5.11 4.22 - 5.81 MIL/uL   Hemoglobin 15.5 13.0 - 17.0 g/dL   HCT 46.3 39.0 - 52.0 %   MCV 90.6 78.0 - 100.0 fL   MCH 30.3 26.0 - 34.0 pg   MCHC 33.5 30.0 - 36.0 g/dL   RDW 14.4 11.5 - 15.5 %   Platelets 220 150 - 400 K/uL    Comment: Performed at [facility] [address] [place], [place] [address]  Creatinine, serum     Status: Abnormal   Collection Time: 08/31/17  3:16 PM  Result Value Ref Range   Creatinine, Ser 1.33 (H) 0.61 - 1.24 mg/dL   GFR calc non Af Amer 50 (L) >60 mL/min   GFR calc Af Amer 57 (L) >60 mL/min    Comment: (NOTE) The eGFR has been calculated using the CKD EPI equation. This calculation has not been validated in all clinical situations. eGFR's persistently <60 mL/min signify possible Chronic Kidney Disease. Performed at [facility], [place] [address], [place], [place] [address]   Glucose, capillary     Status: None   Collection Time: 08/31/17  4:44 PM  Result Value Ref Range   Glucose-Capillary 91 65 - 99 mg/dL  Glucose, capillary     Status: Abnormal   Collection Time: 08/31/17  9:44 PM  Result Value Ref Range   Glucose-Capillary 107 (H) 65 - 99 mg/dL  CBC WITH DIFFERENTIAL     Status: Abnormal   Collection Time: 09/01/17  5:53 AM  Result Value Ref Range   WBC 8.9 4.0 - 10.5 K/uL   RBC 5.28 4.22 - 5.81 MIL/uL    Hemoglobin 15.5 13.0 - 17.0 g/dL   HCT 48.1 39.0 - 52.0 %   MCV 91.1 78.0 - 100.0 fL   MCH 29.4 26.0 - 34.0 pg   MCHC 32.2 30.0 - 36.0 g/dL   RDW 14.7 11.5 - 15.5 %   Platelets 187 150 - 400 K/uL   Neutrophils Relative % 57 %   Neutro Abs 5.0 1.7 - 7.7 K/uL   Lymphocytes Relative 29 %   Lymphs Abs 2.6 0.7 - 4.0 K/uL   Monocytes Relative 12 %   Monocytes Absolute 1.1 (H) 0.1 - 1.0 K/uL   Eosinophils Relative 2 %   Eosinophils Absolute 0.2 0.0 - 0.7 K/uL   Basophils Relative 0 %   Basophils Absolute 0.0 0.0 - 0.1 K/uL    Comment: Performed at [facility], [place]. [address] [place], [place] [address]  Comprehensive metabolic panel     Status: Abnormal   Collection Time: 09/01/17  5:53 AM  Result Value Ref Range   Sodium 138 135 - 145 mmol/L   Potassium 3.8 3.5 - 5.1 mmol/L   Chloride 109 101 - 111 mmol/L   CO2 20 (  L) 22 - 32 mmol/L   Glucose, Bld 98 65 - 99 mg/dL   BUN 27 (H) 6 - 20 mg/dL   Creatinine, Ser 1.23 0.61 - 1.24 mg/dL   Calcium 9.0 8.9 - 10.3 mg/dL   Total Protein 6.6 6.5 - 8.1 g/dL   Albumin 3.4 (L) 3.5 - 5.0 g/dL   AST 25 15 - 41 U/L   ALT 33 17 - 63 U/L   Alkaline Phosphatase 69 38 - 126 U/L   Total Bilirubin 1.0 0.3 - 1.2 mg/dL   GFR calc non Af Amer 54 (L) >60 mL/min   GFR calc Af Amer >60 >60 mL/min    Comment: (NOTE) The eGFR has been calculated using the CKD EPI equation. This calculation has not been validated in all clinical situations. eGFR's persistently <60 mL/min signify possible Chronic Kidney Disease.    Anion gap 9 5 - 15    Comment: Performed at [facility] [address], [place], [place] [address]  Glucose, capillary     Status: None   Collection Time: 09/01/17  7:57 AM  Result Value Ref Range   Glucose-Capillary 94 65 - 99 mg/dL  Glucose, capillary     Status: None   Collection Time: 09/01/17 11:41 AM  Result Value Ref Range   Glucose-Capillary 78 65 - 99 mg/dL  Glucose, capillary     Status: Abnormal   Collection  Time: 09/01/17  4:55 PM  Result Value Ref Range   Glucose-Capillary 104 (H) 65 - 99 mg/dL  Glucose, capillary     Status: Abnormal   Collection Time: 09/01/17  8:57 PM  Result Value Ref Range   Glucose-Capillary 150 (H) 65 - 99 mg/dL  Glucose, capillary     Status: Abnormal   Collection Time: 09/02/17  6:30 AM  Result Value Ref Range   Glucose-Capillary 101 (H) 65 - 99 mg/dL     HEENT: poor dentition Cardio: irregular and no murmur Resp: CTA B/L and unlabored GI: BS positive and NT, ND Extremity:  No Edema Skin:   Intact Neuro: Abnormal Sensory limited by aphasia, Abnormal Motor 4/5 R delt , bi,tri, grip, HF, KE ,ADF, 5/5 on Left, Dysarthric and Aphasic Able to name stethoscope   Musc/Skel:  Other no pain with UE or LE ROM Neg shoulder impingement on left, no pain to palpation Left clvicle, no pain with jaw opening, no pain with neck ROM No facial swelling or erythema , no lesions except keratoses Gen NAD   Assessment/Plan: 1. 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 Physiatrist is providing close team supervision and 24 hour management of active medical problems listed below. Physiatrist and rehab team continue to assess barriers to discharge/monitor patient progress toward functional and medical goals. FIM: Function - Bathing Position: Shower Body parts bathed by patient: Right arm, Left arm, Chest, Abdomen, Front perineal area, Right upper leg, Left upper leg Body parts bathed by helper: Buttocks, Right lower leg, Left lower leg Assist Level: Touching or steadying assistance(Pt > 75%)  Function- Upper Body Dressing/Undressing What is the patient wearing?: Pull over shirt/dress Pull over shirt/dress - Perfomed by patient: Thread/unthread right sleeve, Thread/unthread left sleeve, Put head through opening Pull over shirt/dress - Perfomed by helper: Pull shirt  over trunk Assist Level: Touching or steadying assistance(Pt > 75%) Function - Lower Body Dressing/Undressing What is the patient wearing?: Pants, Non-skid slipper socks Position: Wheelchair/chair at  sink Pants- Performed by patient: Thread/unthread right pants leg, Thread/unthread left pants leg Pants- Performed by helper: Pull pants up/down, Fasten/unfasten pants Non-skid slipper socks- Performed by patient: Don/doff right sock, Don/doff left sock Assist for footwear: Supervision/touching assist Assist for lower body dressing: Touching or steadying assistance (Pt > 75%)  Function - Toileting Toileting steps completed by helper: Adjust clothing prior to toileting, Performs perineal hygiene, Adjust clothing after toileting Toileting Assistive Devices: Grab bar or rail Assist level: Touching or steadying assistance (Pt.75%)  Function - Air cabin crew transfer assistive device: Grab bar Assist level to toilet: Touching or steadying assistance (Pt > 75%) Assist level from toilet: Touching or steadying assistance (Pt > 75%)  Function - Chair/bed transfer Chair/bed transfer method: Stand pivot Chair/bed transfer assist level: Touching or steadying assistance (Pt > 75%) Chair/bed transfer assistive device: Armrests  Function - Locomotion: Wheelchair Type: Manual Max wheelchair distance: 18f  Assist Level: Supervision or verbal cues Assist Level: Supervision or verbal cues Wheel 150 feet activity did not occur: Safety/medical concerns Turns around,maneuvers to table,bed, and toilet,negotiates 3% grade,maneuvers on rugs and over doorsills: No Function - Locomotion: Ambulation Assistive device: No device Max distance: 738fAssist level: Moderate assist (Pt 50 - 74%) Assist level: Moderate assist (Pt 50 - 74%) Assist level: Moderate assist (Pt 50 - 74%) Walk 150 feet activity did not occur: Safety/medical concerns Walk 10 feet on uneven surfaces activity did not occur:  Safety/medical concerns  Function - Comprehension Comprehension: Auditory Comprehension assist level: Understands basic 50 - 74% of the time/ requires cueing 25 - 49% of the time  Function - Expression Expression: Verbal Expression assist level: Expresses basic 50 - 74% of the time/requires cueing 25 - 49% of the time. Needs to repeat parts of sentences.  Function - Social Interaction Social Interaction assist level: Interacts appropriately 75 - 89% of the time - Needs redirection for appropriate language or to initiate interaction.  Function - Problem Solving Problem solving assist level: Solves basic 75 - 89% of the time/requires cueing 10 - 24% of the time  Function - Memory Memory assist level: Recognizes or recalls 75 - 89% of the time/requires cueing 10 - 24% of the time Patient normally able to recall (first 3 days only): That he or she is in a hospital  Medical Problem List and Plan: 1.Right sided weakness and aphasiasecondary to left thalamic ICH. Plan follow-up CT 2-3 weeks if ICH resolves add Eliquis to aspirin secondary to history of atrial fibrillation -Cont CIR PT, OT< SLP 2. DVT Prophylaxis/Anticoagulation: Subcutaneous heparin initiated 08/28/2017 3. Pain Management:Tylenol as needed, vague pain complaints exam neg with monitor pt is in no distress, smiling 4. Mood:Paxil 20 mg daily, Seroquel 25 mg nightly Xanax 0.5 mg 3 times daily as needed 5. Neuropsych: This patientiscapable of making decisions on hisown behalf. 6. Skin/Wound Care:Routine skin checks 7. Fluids/Electrolytes/Nutrition:Routine I&O's with follow-up chemistriesupon admit, BMET reviewed no signs of renal insuff, monitor po fluid intake 8.Atrial fibrillation. Aspirin to be initiated 81 mg 09/01/2017. Cardiac rate controlled 9.Hypertension. Norvasc 10 mg daily, lisinopril 20 mg daily, Lopressor 12.5 mg twice daily Vitals:   09/01/17 2150 09/02/17 0125  BP: 126/83 (!) 150/90   Pulse: 60 70  Resp:  16  Temp:  98.9 F (37.2 C)  SpO2:  99%  still labile but within acceptable range , cont rehab 10.Prediabetic. SSI. Check blood sugars before meals and at bedtime 11.BPH. Proscar 5 mg daily. Check PVR x3 12.Hyperlipidemia. Crestor    LOS (  Days) 2 A FACE TO FACE EVALUATION WAS PERFORMED  Charlett Blake 09/02/2017, 8:45 AM

## 2017-09-03 ENCOUNTER — Inpatient Hospital Stay (HOSPITAL_COMMUNITY): Payer: Medicare Other | Admitting: Occupational Therapy

## 2017-09-03 ENCOUNTER — Inpatient Hospital Stay (HOSPITAL_COMMUNITY): Payer: Medicare Other | Admitting: Speech Pathology

## 2017-09-03 ENCOUNTER — Inpatient Hospital Stay (HOSPITAL_COMMUNITY): Payer: Medicare Other | Admitting: Physical Therapy

## 2017-09-03 LAB — GLUCOSE, CAPILLARY
GLUCOSE-CAPILLARY: 73 mg/dL (ref 65–99)
GLUCOSE-CAPILLARY: 124 mg/dL — AB (ref 65–99)
Glucose-Capillary: 106 mg/dL — ABNORMAL HIGH (ref 65–99)
Glucose-Capillary: 95 mg/dL (ref 65–99)

## 2017-09-03 NOTE — Progress Notes (Addendum)
Occupational Therapy Session Note  Patient Details  Name: Eugene [name] MRN: [id number] Date of Birth: 1941/04/18  Today's Date: 09/03/2017 OT Individual Time: 1100-1200 OT Individual Time Calculation (min): 60 min    Short Term Goals: Week 1:  OT Short Term Goal 1 (Week 1): Pt will complete LB dressing with supervision OT Short Term Goal 2 (Week 1): Pt will tolerate standing at the sink in preparation for BADL tasks for 3 mins OT Short Term Goal 3 (Week 1): Pt will integrate R UE within bathing tasks with min cues 75% of the time  Skilled Therapeutic Interventions/Progress Updates:    Pt greeted sitting in wc with daughter present. Daughter and pt seemed unwawre that pt had scooted down in chair and needed cues to sit up and scoot bottom back. Pt declined bathing/dressing or toileting today. Pt brought to gym and given pink thera-putty. Worked on R hand coordination and strength with putty exercises. Pt needed multimodal cues and hand over hand A to understand directions of how to manipulate putty.Peg board puzzle with focus on problem solving, R attention, and R fine motor control. Chose simple 2 color, horizontal pattern. Pt needed max multimodal cues to first identify color pattern with OT starting pattern for pt. Hand over hand A to keep pt from using L hand with task. With repetition, pt eventually able to identify pattern with min questioning cues. Pt also needed cues to turn peg around and place in hole. UB there-ex using 1lb dowel rod straight arm raise, chest press, and biceps curl 10x3 sets. Pt returned to room and left seated in wc with daughter present and safety belt on.   Therapy Documentation Precautions:  Precautions Precautions: Fall Restrictions Weight Bearing Restrictions: No Pain:  none/denies pain ADL: ADL ADL Comments: Please see functional navigator  See Function Navigator for Current Functional Status.   Therapy/Group: Individual Therapy  Valma Cava 09/03/2017, 11:30 AM

## 2017-09-03 NOTE — Progress Notes (Signed)
Speech Language Pathology Daily Session Note  Patient Details  Name: Eugene [name] MRN: [id number] Date of Birth: Aug 21, 1940  Today's Date: 09/03/2017 SLP Individual Time: 1030-1100 SLP Individual Time Calculation (min): 30 min  Short Term Goals: Week 1: SLP Short Term Goal 1 (Week 1): Pt will follow 2-step directions during functional tasks with Min A verbal cues.  SLP Short Term Goal 2 (Week 1): Pt will demonstrate word-finding at the phrase level with Min A multimodal cues.  SLP Short Term Goal 3 (Week 1): Pt will self-monitor and correct verbal errors in 50% opportunities with Min A verbal cues.   Skilled Therapeutic Interventions: Skilled treatment session focused on communication goals. SLP facilitated session by providing Mod A faded to Min A verbal cues for word-finding at the phrase level during a verbal description task. Pt reported "I don't know" or "I don't see anything" when given 2-step complex directions; however, with more basic directions, pt able to complete task with Min A verbal cues. Pt also required Min A verbal cues to self-monitor verbal errors, but required Mod A verbal cues to correct them throughout session. Pt left upright in wheelchair with daughter present. Continue with current plan of care.      Function:  Cognition Comprehension Comprehension assist level: Understands basic 50 - 74% of the time/ requires cueing 25 - 49% of the time  Expression   Expression assist level: Expresses basic 50 - 74% of the time/requires cueing 25 - 49% of the time. Needs to repeat parts of sentences.  Social Interaction Social Interaction assist level: Interacts appropriately 75 - 89% of the time - Needs redirection for appropriate language or to initiate interaction.  Problem Solving Problem solving assist level: Solves basic 50 - 74% of the time/requires cueing 25 - 49% of the time  Memory Memory assist level: Recognizes or recalls 75 - 89% of the time/requires cueing 10 -  24% of the time    Pain Pain Assessment Pain Score: 0-No pain  Therapy/Group: Individual Therapy  Meredeth Ide  SLP - Student 09/03/2017, 12:12 PM

## 2017-09-03 NOTE — IPOC Note (Signed)
Overall Plan of Care [facility]) Patient Details Name: MERIC JOYE MRN: [id number] DOB: Feb 12, 1941  Admitting Diagnosis: <principal problem not specified>  Hospital Problems: Active Problems:   ICH (intracerebral hemorrhage) (HCC) - L thalamic d/t HTN in setting of Xarelto and plavix use   Thalamic hemorrhage (HCC)   Right hemiparesis (HCC)     Functional Problem List: Nursing Bladder, Medication Management, Safety  PT Balance, Behavior, Endurance, Motor, Perception, Sensory, Skin Integrity, Safety  OT Balance, Cognition, Endurance, Motor, Perception, Sensory, Safety  SLP Linguistic  TR         Basic ADL's: OT Eating, Grooming, Bathing, Dressing, Toileting     Advanced  ADL's: OT       Transfers: PT Bed Mobility, Bed to Chair, Car, [employer], Futures trader, Metallurgist: PT Ambulation, Emergency planning/management officer, Stairs     Additional Impairments: OT Fuctional Use of Upper Extremity  SLP Communication, Social Cognition comprehension, expression Awareness  TR      Anticipated Outcomes Item Anticipated Outcome  Self Feeding Mod I  Swallowing  N/A   Basic self-care  Mod I  Toileting  Mod I   Bathroom Transfers Supervision  Bowel/Bladder  Manage bladder with MODI assist  Transfers  Supervision assist with LRAD   Locomotion  Supervision assist for ambulation up to 147ft with LRAD   Communication  Supervision  Cognition     Pain     Safety/Judgment  Maintain safety with cues and reminders.   Therapy Plan: PT Intensity: Minimum of 1-2 x/day ,45 to 90 minutes PT Frequency: 5 out of 7 days PT Duration Estimated Length of Stay: 10-14 OT Intensity: Minimum of 1-2 x/day, 45 to 90 minutes OT Frequency: 5 out of 7 days OT Duration/Estimated Length of Stay: 10-14 days SLP Intensity: Minumum of 1-2 x/day, 30 to 90 minutes SLP Frequency: 3 to 5 out of 7 days SLP Duration/Estimated Length of Stay: 10-14 days    Team Interventions: Nursing  Interventions Patient/Family Education, Disease Management/Prevention, Discharge Planning, Bladder Management, Psychosocial Support, Medication Management  PT interventions Ambulation/gait training, Balance/vestibular training, Cognitive remediation/compensation, Disease management/prevention, Discharge planning, Community reintegration, DME/adaptive equipment instruction, Functional electrical stimulation, Pain management, Neuromuscular re-education, Psychosocial support, Splinting/orthotics, Patient/family education, Functional mobility training, Skin care/wound management, Therapeutic Exercise, Therapeutic Activities, Stair training, UE/LE Strength taining/ROM, UE/LE Coordination activities, Visual/perceptual remediation/compensation, Wheelchair propulsion/positioning  OT Interventions Cognitive remediation/compensation, Training and development officer, Academic librarian, Discharge planning, Engineer, drilling, Functional mobility training, Neuromuscular re-education, Patient/family education, Self Care/advanced ADL retraining, Splinting/orthotics, Therapeutic Activities, UE/LE Strength taining/ROM, Therapeutic Exercise, UE/LE Coordination activities  SLP Interventions Multimodal communication approach, Speech/Language facilitation, Cueing hierarchy, Functional tasks, Internal/external aids, Therapeutic Activities, Patient/family education  TR Interventions    SW/CM Interventions Discharge Planning, Psychosocial Support, Patient/Family Education   Barriers to Discharge MD  Medical stability and aphasia  Nursing      PT Inaccessible home environment, Home environment Child psychotherapist, Insurance underwriter for SNF coverage    OT      SLP      SW       Team Discharge Planning: Destination: PT-Home ,OT- Home , SLP-Home Projected Follow-up: PT-Home health PT, OT-  Home health OT, SLP-Outpatient SLP, 24 hour supervision/assistance Projected Equipment Needs: PT-Rolling walker with 5" wheels,  Wheelchair cushion (measurements), Wheelchair (measurements), OT- To be determined, SLP-None recommended by SLP Equipment Details: PT- , OT-  Patient/family involved in discharge planning: PT- Patient, Family member/caregiver,  OT-Patient, Family member/caregiver, SLP-Patient  MD ELOS: 12-18d Medical Rehab Prognosis:  Good  Assessment:  77 y.o.right-handed malewith history ofprediabetes, hypertension, hyperlipidemia, tobacco abuse and atrial fibrillation maintained on Xarelto and question medical compliance. Per chart review patient lives alone independent and driving as well as active. One level home. Presented 08/25/2017 with right-sided weakness and aphasia. Blood pressure 157/98. CT the head showed oval left thalamic hemorrhage with an estimated blood volume of 8 mL. Mild surrounding edema and regional mass-effect. 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 Neurology follow-up currently plan is for aspirin 81 mg in 5-7 days of neuro stable post ICH with plans to repeat CT in 2-3 weeks. If ICH resolves plan was to add Eliquis to aspirin.Patient presently on subcutaneous heparin since 4/13/2019Echocardiogramwith ejection fraction of 60%   Now requiring 24/7 Rehab RN,MD, as well as CIR level PT, OT and SLP.  Treatment team will focus on ADLs and mobility with goals set at Sup/Mod I  See Team Conference Notes for weekly updates to the plan of care

## 2017-09-03 NOTE — Progress Notes (Signed)
Subjective/Complaints:  No shoulder pain complaints,  Daughter at bedside  ROS  Difficult to ellicit due to aphasia  Objective: Vital Signs: Blood pressure (!) 145/73, pulse 61, temperature 98.5 F (36.9 C), temperature source Oral, resp. rate 20, height 6' (1.829 m), weight 85 kg (187 lb 6.3 oz), SpO2 99 %. No results found. Results for orders placed or performed during the hospital encounter of 08/31/17 (from the past 72 hour(s))  CBC     Status: Abnormal   Collection Time: 08/31/17  3:16 PM  Result Value Ref Range   WBC 10.9 (H) 4.0 - 10.5 K/uL   RBC 5.11 4.22 - 5.81 MIL/uL   Hemoglobin 15.5 13.0 - 17.0 g/dL   HCT 46.3 39.0 - 52.0 %   MCV 90.6 78.0 - 100.0 fL   MCH 30.3 26.0 - 34.0 pg   MCHC 33.5 30.0 - 36.0 g/dL   RDW 14.4 11.5 - 15.5 %   Platelets 220 150 - 400 K/uL    Comment: Performed at [facility] [address] [place], [place] [address]  Creatinine, serum     Status: Abnormal   Collection Time: 08/31/17  3:16 PM  Result Value Ref Range   Creatinine, Ser 1.33 (H) 0.61 - 1.24 mg/dL   GFR calc non Af Amer 50 (L) >60 mL/min   GFR calc Af Amer 57 (L) >60 mL/min    Comment: (NOTE) The eGFR has been calculated using the CKD EPI equation. This calculation has not been validated in all clinical situations. eGFR's persistently <60 mL/min signify possible Chronic Kidney Disease. Performed at [facility], [place] [address], [place], [place] [address]   Glucose, capillary     Status: None   Collection Time: 08/31/17  4:44 PM  Result Value Ref Range   Glucose-Capillary 91 65 - 99 mg/dL  Glucose, capillary     Status: Abnormal   Collection Time: 08/31/17  9:44 PM  Result Value Ref Range   Glucose-Capillary 107 (H) 65 - 99 mg/dL  CBC WITH DIFFERENTIAL     Status: Abnormal   Collection Time: 09/01/17  5:53 AM  Result Value Ref Range   WBC 8.9 4.0 - 10.5 K/uL   RBC 5.28 4.22 - 5.81 MIL/uL   Hemoglobin 15.5 13.0 - 17.0 g/dL   HCT 48.1 39.0 - 52.0 %   MCV  91.1 78.0 - 100.0 fL   MCH 29.4 26.0 - 34.0 pg   MCHC 32.2 30.0 - 36.0 g/dL   RDW 14.7 11.5 - 15.5 %   Platelets 187 150 - 400 K/uL   Neutrophils Relative % 57 %   Neutro Abs 5.0 1.7 - 7.7 K/uL   Lymphocytes Relative 29 %   Lymphs Abs 2.6 0.7 - 4.0 K/uL   Monocytes Relative 12 %   Monocytes Absolute 1.1 (H) 0.1 - 1.0 K/uL   Eosinophils Relative 2 %   Eosinophils Absolute 0.2 0.0 - 0.7 K/uL   Basophils Relative 0 %   Basophils Absolute 0.0 0.0 - 0.1 K/uL    Comment: Performed at [facility], [place]. [address] [place], [place] [address]  Comprehensive metabolic panel     Status: Abnormal   Collection Time: 09/01/17  5:53 AM  Result Value Ref Range   Sodium 138 135 - 145 mmol/L   Potassium 3.8 3.5 - 5.1 mmol/L   Chloride 109 101 - 111 mmol/L   CO2 20 (L) 22 - 32 mmol/L   Glucose, Bld 98 65 -  99 mg/dL   BUN 27 (H) 6 - 20 mg/dL   Creatinine, Ser 1.23 0.61 - 1.24 mg/dL   Calcium 9.0 8.9 - 10.3 mg/dL   Total Protein 6.6 6.5 - 8.1 g/dL   Albumin 3.4 (L) 3.5 - 5.0 g/dL   AST 25 15 - 41 U/L   ALT 33 17 - 63 U/L   Alkaline Phosphatase 69 38 - 126 U/L   Total Bilirubin 1.0 0.3 - 1.2 mg/dL   GFR calc non Af Amer 54 (L) >60 mL/min   GFR calc Af Amer >60 >60 mL/min    Comment: (NOTE) The eGFR has been calculated using the CKD EPI equation. This calculation has not been validated in all clinical situations. eGFR's persistently <60 mL/min signify possible Chronic Kidney Disease.    Anion gap 9 5 - 15    Comment: Performed at [facility] [address] [place], [place] [address]  Glucose, capillary     Status: None   Collection Time: 09/01/17  7:57 AM  Result Value Ref Range   Glucose-Capillary 94 65 - 99 mg/dL  Glucose, capillary     Status: None   Collection Time: 09/01/17 11:41 AM  Result Value Ref Range   Glucose-Capillary 78 65 - 99 mg/dL  Glucose, capillary     Status: Abnormal   Collection Time: 09/01/17  4:55 PM  Result Value Ref Range   Glucose-Capillary 104  (H) 65 - 99 mg/dL  Glucose, capillary     Status: Abnormal   Collection Time: 09/01/17  8:57 PM  Result Value Ref Range   Glucose-Capillary 150 (H) 65 - 99 mg/dL  Glucose, capillary     Status: Abnormal   Collection Time: 09/02/17  6:30 AM  Result Value Ref Range   Glucose-Capillary 101 (H) 65 - 99 mg/dL  Glucose, capillary     Status: None   Collection Time: 09/02/17 11:37 AM  Result Value Ref Range   Glucose-Capillary 83 65 - 99 mg/dL  Glucose, capillary     Status: Abnormal   Collection Time: 09/02/17  4:49 PM  Result Value Ref Range   Glucose-Capillary 103 (H) 65 - 99 mg/dL   Comment 1 Notify RN   Glucose, capillary     Status: Abnormal   Collection Time: 09/02/17  9:00 PM  Result Value Ref Range   Glucose-Capillary 124 (H) 65 - 99 mg/dL   Comment 1 Notify RN   Glucose, capillary     Status: Abnormal   Collection Time: 09/03/17  7:01 AM  Result Value Ref Range   Glucose-Capillary 106 (H) 65 - 99 mg/dL     HEENT: poor dentition Cardio: irregular and no murmur Resp: CTA B/L and unlabored GI: BS positive and NT, ND Extremity:  No Edema Skin:   Intact Neuro: Abnormal Sensory limited by aphasia, Abnormal Motor 4/5 R delt , bi,tri, grip, HF, KE ,ADF, 5/5 on Left, Dysarthric and Aphasic Contralateral ideomotor apraxia  Musc/Skel:  Other no pain with UE or LE ROM Neg shoulder impingement on left, no pain to palpation Left clvicle, no pain with jaw opening, no pain with neck ROM No facial swelling or erythema , no lesions except keratoses Gen NAD   Assessment/Plan: 1. 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 Physiatrist is providing close team supervision and 24 hour management of active medical problems listed below. Physiatrist and rehab team continue to assess barriers  to discharge/monitor patient progress toward functional and medical goals. FIM: Function -  Bathing Position: Shower Body parts bathed by patient: Right arm, Left arm, Chest, Abdomen, Front perineal area, Right upper leg, Left upper leg Body parts bathed by helper: Buttocks, Right lower leg, Left lower leg Assist Level: Touching or steadying assistance(Pt > 75%)  Function- Upper Body Dressing/Undressing What is the patient wearing?: Pull over shirt/dress Pull over shirt/dress - Perfomed by patient: Thread/unthread right sleeve, Thread/unthread left sleeve, Put head through opening, Pull shirt over trunk Pull over shirt/dress - Perfomed by helper: Pull shirt over trunk Assist Level: Supervision or verbal cues Function - Lower Body Dressing/Undressing What is the patient wearing?: Pants, Non-skid slipper socks Position: Wheelchair/chair at sink Pants- Performed by patient: Thread/unthread right pants leg, Thread/unthread left pants leg Pants- Performed by helper: Pull pants up/down, Fasten/unfasten pants Non-skid slipper socks- Performed by patient: Don/doff right sock, Don/doff left sock Assist for footwear: Supervision/touching assist Assist for lower body dressing: Touching or steadying assistance (Pt > 75%)  Function - Toileting Toileting steps completed by patient: Adjust clothing prior to toileting Toileting steps completed by helper: Performs perineal hygiene, Adjust clothing after toileting Toileting Assistive Devices: Grab bar or rail Assist level: Touching or steadying assistance (Pt.75%)  Function - Air cabin crew transfer assistive device: Grab bar Assist level to toilet: Touching or steadying assistance (Pt > 75%) Assist level from toilet: Touching or steadying assistance (Pt > 75%)  Function - Chair/bed transfer Chair/bed transfer method: Stand pivot Chair/bed transfer assist level: Touching or steadying assistance (Pt > 75%) Chair/bed transfer assistive device: Armrests, Walker  Function - Locomotion: Wheelchair Type: Manual Max wheelchair  distance: 168f  Assist Level: Supervision or verbal cues Assist Level: Supervision or verbal cues Wheel 150 feet activity did not occur: Safety/medical concerns Turns around,maneuvers to table,bed, and toilet,negotiates 3% grade,maneuvers on rugs and over doorsills: No Function - Locomotion: Ambulation Assistive device: Walker-rolling Max distance: 150' Assist level: Touching or steadying assistance (Pt > 75%) Assist level: Touching or steadying assistance (Pt > 75%) Assist level: Touching or steadying assistance (Pt > 75%) Walk 150 feet activity did not occur: Safety/medical concerns Assist level: Touching or steadying assistance (Pt > 75%) Walk 10 feet on uneven surfaces activity did not occur: Safety/medical concerns  Function - Comprehension Comprehension: Auditory Comprehension assist level: Understands basic 50 - 74% of the time/ requires cueing 25 - 49% of the time  Function - Expression Expression: Verbal Expression assist level: Expresses basic 50 - 74% of the time/requires cueing 25 - 49% of the time. Needs to repeat parts of sentences.  Function - Social Interaction Social Interaction assist level: Interacts appropriately 75 - 89% of the time - Needs redirection for appropriate language or to initiate interaction.  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 Plan follow-up CT 2-3 weeks if ICH resolves add Eliquis to aspirin secondary to history of atrial fibrillation -Cont CIR PT, OT< SLP 2. DVT Prophylaxis/Anticoagulation: Subcutaneous heparin initiated  08/28/2017 3. Pain Management:Tylenol as needed,4. Mood:Paxil 20 mg daily, Seroquel 25 mg nightly Xanax 0.5 mg 3  times daily as needed 5. Neuropsych: This patientiscapable of making decisions on hisown behalf. 6. Skin/Wound Care:Routine skin checks 7. Fluids/Electrolytes/Nutrition:Routine I&O's with follow-up chemistriesupon admit, BMET reviewed no signs of renal insuff, monitor po fluid intake, I 886m 8.Atrial fibrillation. Aspirin to be initiated 81 mg 09/01/2017. Cardiac rate controlled 9.Hypertension. Norvasc 10 mg daily, lisinopril 20 mg daily, Lopressor 12.5 mg twice daily Vitals:   09/03/17 0535 09/03/17 0802  BP: (!) 145/73   Pulse: (!) 53 61  Resp: 20   Temp: 98.5 F (36.9 C)   SpO2: 99%   controlled HR ~60 manual 10.Prediabetic. SSI. CBGs normal 11.BPH. Proscar 5 mg daily. Check PVR x3 12.Hyperlipidemia. Crestor    LOS (Days) 3 A FACE TO FACE EVALUATION WAS PERFORMED  ACharlett Blake4/19/2019, 8:02 AM

## 2017-09-03 NOTE — Progress Notes (Signed)
Occupational Therapy Session Note  Patient Details  Name: Eugene [name] MRN: [id number] Date of Birth: 1940/07/19  Today's Date: 09/03/2017 OT Individual Time: 1400-1455 OT Individual Time Calculation (min): 55 min    Short Term Goals: Week 1:  OT Short Term Goal 1 (Week 1): Pt will complete LB dressing with supervision OT Short Term Goal 2 (Week 1): Pt will tolerate standing at the sink in preparation for BADL tasks for 3 mins OT Short Term Goal 3 (Week 1): Pt will integrate R UE within bathing tasks with min cues 75% of the time  Skilled Therapeutic Interventions/Progress Updates: patient partipation in skiled OT today as follows:  Bed to toilet transfer - walked with stooped posture with close S from bed to/fr toilet.     Completed cleansing, pulling down brief, pulling up pants and donning new brief and washing periarea with Min A and fair- balance.   Stood at sink with stooped posture for approximately one minute to wash hands after BM and body washing.  Completed right hand motor control and strengthening to increase functional use.  Patient demonstrated some ataxia and apraxia with some tasks but was easily redirected with tactile hand over hand assist to start the correct movement for the correct part of the body.  Patient left in recliner with feet up with sister sitting close by.  She asked this clinician not to engage patient safety belt and ensured she would alert staff if she were to leave the room.   Patient demonstrative ability to properly use call bell.     Therapy Documentation Precautions:  Precautions Precautions: Fall Restrictions Weight Bearing Restrictions: No    Other Treatments:    See Function Navigator for Current Functional Status.   Therapy/Group: Individual Therapy  Alfredia Ferguson [facility] 09/03/2017, 4:43 PM

## 2017-09-03 NOTE — Progress Notes (Signed)
Physical Therapy Session Note  Patient Details  Name: Eugene [name] MRN: [id number] Date of Birth: [birth year]  Today's Date: 09/03/2017 PT Individual Time: 0815-0900 PT Individual Time Calculation (min): 45 min  and Today's Date: 09/03/2017 PT Missed Time: 15 Minutes Missed Time Reason: Unavailable (Comment)(eating breakfast)  Short Term Goals: Week 1:  PT Short Term Goal 1 (Week 1): STG =LTG due to ELOS   Skilled Therapeutic Interventions/Progress Updates:   Pt in supine and agreeable to therapy, asking to finish his breakfast first. Missed 15 min of skilled PT 2/2 breakfast. No c/o pain this session. Focused on NMR w/ dynamic standing balance and gait this session. Ambulated to/from therapy gym w/ min guard using RW for support and frequent verbal cues for gait pattern, to increase R step length and R foot clearance. Performed dynamic standing balance tasks w/ unilateral UE support at first and transitioning to no UE support. Discussed carryover to relying on LE support when performing functional tasks at sink or counter top. Practiced ambulating w/o RW, min assist overall and frequent verbal and tactile cues for increased upright posture and increased R step length. Pt increases his flexed posture and increases bilateral knee flexion in stance w/ fatigue. Requires max cues to overcome and correct. Performed gait tasks at rail in hallway going both forward and backwards to work on Cass, R body awareness, and posture. Returned to room and ended session in recliner and in care of daughter. Both pt and daughter verbalized understanding that she must call nurses station to let them know she is leaving before leaving room as pt cannot be left in chair alone at this time, but she plans to be present for a few hours.   Therapy Documentation Precautions:  Precautions Precautions: Fall Restrictions Weight Bearing Restrictions: No Vital Signs: Therapy Vitals Temp: 98.5 F (36.9 C) Temp  Source: Oral Pulse Rate: 61 Resp: 20 BP: (!) 145/73 Patient Position (if appropriate): Lying Oxygen Therapy SpO2: 99 % O2 Device: Room Air  See Function Navigator for Current Functional Status.   Therapy/Group: Individual Therapy  [name] 09/03/2017, 9:10 AM

## 2017-09-04 ENCOUNTER — Inpatient Hospital Stay (HOSPITAL_COMMUNITY): Payer: Medicare Other | Admitting: Physical Therapy

## 2017-09-04 ENCOUNTER — Inpatient Hospital Stay (HOSPITAL_COMMUNITY): Payer: Medicare Other | Admitting: Occupational Therapy

## 2017-09-04 LAB — GLUCOSE, CAPILLARY
GLUCOSE-CAPILLARY: 132 mg/dL — AB (ref 65–99)
Glucose-Capillary: 125 mg/dL — ABNORMAL HIGH (ref 65–99)
Glucose-Capillary: 94 mg/dL (ref 65–99)
Glucose-Capillary: 97 mg/dL (ref 65–99)

## 2017-09-04 MED ORDER — HYDROCHLOROTHIAZIDE 12.5 MG PO CAPS
12.5000 mg | ORAL_CAPSULE | Freq: Every day | ORAL | Status: DC
  Administered 2017-09-05 – 2017-09-11 (×7): 12.5 mg via ORAL
  Filled 2017-09-04 (×7): qty 1

## 2017-09-04 MED ORDER — HYDROCHLOROTHIAZIDE 12.5 MG PO CAPS: 12.5000 mg | ORAL_CAPSULE | Freq: Every day | ORAL | Status: DC

## 2017-09-04 NOTE — Progress Notes (Signed)
Subjective/Complaints:    ROS  Difficult to ellicit due to aphasia  Objective: Vital Signs: Blood pressure (!) 159/103, pulse (!) 54, temperature 98.4 F (36.9 C), temperature source Oral, resp. rate 20, height 6' (1.829 m), weight 85 kg (187 lb 6.3 oz), SpO2 98 %. No results found. Results for orders placed or performed during the hospital encounter of 08/31/17 (from the past 72 hour(s))  Glucose, capillary     Status: None   Collection Time: 09/01/17 11:41 AM  Result Value Ref Range   Glucose-Capillary 78 65 - 99 mg/dL  Glucose, capillary     Status: Abnormal   Collection Time: 09/01/17  4:55 PM  Result Value Ref Range   Glucose-Capillary 104 (H) 65 - 99 mg/dL  Glucose, capillary     Status: Abnormal   Collection Time: 09/01/17  8:57 PM  Result Value Ref Range   Glucose-Capillary 150 (H) 65 - 99 mg/dL  Glucose, capillary     Status: Abnormal   Collection Time: 09/02/17  6:30 AM  Result Value Ref Range   Glucose-Capillary 101 (H) 65 - 99 mg/dL  Glucose, capillary     Status: None   Collection Time: 09/02/17 11:37 AM  Result Value Ref Range   Glucose-Capillary 83 65 - 99 mg/dL  Glucose, capillary     Status: Abnormal   Collection Time: 09/02/17  4:49 PM  Result Value Ref Range   Glucose-Capillary 103 (H) 65 - 99 mg/dL   Comment 1 Notify RN   Glucose, capillary     Status: Abnormal   Collection Time: 09/02/17  9:00 PM  Result Value Ref Range   Glucose-Capillary 124 (H) 65 - 99 mg/dL   Comment 1 Notify RN   Glucose, capillary     Status: Abnormal   Collection Time: 09/03/17  7:01 AM  Result Value Ref Range   Glucose-Capillary 106 (H) 65 - 99 mg/dL  Glucose, capillary     Status: None   Collection Time: 09/03/17 12:04 PM  Result Value Ref Range   Glucose-Capillary 73 65 - 99 mg/dL  Glucose, capillary     Status: None   Collection Time: 09/03/17  4:39 PM  Result Value Ref Range   Glucose-Capillary 95 65 - 99 mg/dL  Glucose, capillary     Status: Abnormal   Collection Time: 09/03/17  9:15 PM  Result Value Ref Range   Glucose-Capillary 124 (H) 65 - 99 mg/dL  Glucose, capillary     Status: Abnormal   Collection Time: 09/04/17  6:31 AM  Result Value Ref Range   Glucose-Capillary 125 (H) 65 - 99 mg/dL     HEENT: poor dentition Cardio: irregular and no murmur Resp: CTA B/L and unlabored GI: BS positive and NT, ND Extremity:  No Edema Skin:   Intact Neuro: Abnormal Sensory limited by aphasia, Abnormal Motor 4/5 R delt , bi,tri, grip, HF, KE ,ADF, 5/5 on Left, Dysarthric and Aphasic Contralateral ideomotor apraxia  Musc/Skel:  Other no pain with UE or LE ROM Neg shoulder impingement on left, no pain to palpation Left clvicle, no pain with jaw opening, no pain with neck ROM No facial swelling or erythema , no lesions except keratoses Gen NAD   Assessment/Plan: 1. 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 Physiatrist is providing close team supervision and 24 hour management of active medical problems listed below. Physiatrist and rehab team continue to assess barriers to discharge/monitor  patient progress toward functional and medical goals. FIM: Function - Bathing Position: Shower Body parts bathed by patient: Right arm, Left arm, Chest, Abdomen, Front perineal area, Right upper leg, Left upper leg Body parts bathed by helper: Buttocks, Right lower leg, Left lower leg Assist Level: (moderate assist)  Function- Upper Body Dressing/Undressing What is the patient wearing?: Pull over shirt/dress Pull over shirt/dress - Perfomed by patient: Thread/unthread right sleeve, Thread/unthread left sleeve, Put head through opening, Pull shirt over trunk Pull over shirt/dress - Perfomed by helper: Pull shirt over trunk Assist Level: Supervision or verbal cues Function - Lower Body Dressing/Undressing What is the patient wearing?: Pants,  Non-skid slipper socks Position: Wheelchair/chair at sink Pants- Performed by patient: Thread/unthread right pants leg, Thread/unthread left pants leg Pants- Performed by helper: Pull pants up/down, Fasten/unfasten pants Non-skid slipper socks- Performed by patient: Don/doff right sock, Don/doff left sock Assist for footwear: Supervision/touching assist Assist for lower body dressing: Touching or steadying assistance (Pt > 75%)  Function - Toileting Toileting steps completed by patient: Adjust clothing prior to toileting Toileting steps completed by helper: Performs perineal hygiene, Adjust clothing after toileting Toileting Assistive Devices: Grab bar or rail Assist level: Touching or steadying assistance (Pt.75%)  Function - Air cabin crew transfer assistive device: Grab bar Assist level to toilet: Touching or steadying assistance (Pt > 75%) Assist level from toilet: Touching or steadying assistance (Pt > 75%)  Function - Chair/bed transfer Chair/bed transfer method: Stand pivot, Ambulatory Chair/bed transfer assist level: Touching or steadying assistance (Pt > 75%) Chair/bed transfer assistive device: Armrests, Walker  Function - Locomotion: Wheelchair Type: Manual Max wheelchair distance: 138ft  Assist Level: Supervision or verbal cues Assist Level: Supervision or verbal cues Wheel 150 feet activity did not occur: Safety/medical concerns Turns around,maneuvers to table,bed, and toilet,negotiates 3% grade,maneuvers on rugs and over doorsills: No Function - Locomotion: Ambulation Assistive device: Walker-rolling Max distance: 150' Assist level: Touching or steadying assistance (Pt > 75%) Assist level: Touching or steadying assistance (Pt > 75%) Assist level: Touching or steadying assistance (Pt > 75%) Walk 150 feet activity did not occur: Safety/medical concerns Assist level: Touching or steadying assistance (Pt > 75%) Walk 10 feet on uneven surfaces activity did not  occur: Safety/medical concerns  Function - Comprehension Comprehension: Auditory Comprehension assist level: Understands basic 50 - 74% of the time/ requires cueing 25 - 49% of the time  Function - Expression Expression: Verbal Expression assist level: Expresses basic 50 - 74% of the time/requires cueing 25 - 49% of the time. Needs to repeat parts of sentences.  Function - Social Interaction Social Interaction assist level: Interacts appropriately 75 - 89% of the time - Needs redirection for appropriate language or to initiate interaction.  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 Plan follow-up CT 2-3 weeks if ICH resolves add Eliquis to aspirin secondary to history of atrial fibrillation -Cont CIR PT, OT, SLP 2. DVT Prophylaxis/Anticoagulation: Subcutaneous heparin initiated 08/28/2017 3. Pain Management:Tylenol as needed,4. Mood:Paxil 20 mg daily, Seroquel 25 mg nightly Xanax 0.5 mg 3 times daily as needed 5. Neuropsych:  This patientiscapable of making decisions on hisown behalf. 6. Skin/Wound Care:Routine skin checks 7. Fluids/Electrolytes/Nutrition:Routine I&O's with follow-up chemistriesupon admit, BMET reviewed no signs of renal insuff, monitor po fluid intake, I 634ml 8.Atrial fibrillation. Aspirin to be initiated 81 mg 09/01/2017. Cardiac rate controlled 9.Hypertension. Norvasc 10 mg daily, lisinopril 20 mg daily, Lopressor 12.5 mg twice daily Vitals:   09/03/17 2104 09/04/17 0552  BP: (!) 142/74 (!) 159/103  Pulse: (!) 56 (!) 54  Resp:  20  Temp:   98.4 F (36.9 C)  SpO2:  98%  elevated BP but low HR d/c metoprolol add HCTZ and monitor K 10.Prediabetic. SSI. CBGs normal 11.BPH. Proscar 5 mg daily. Check PVR x3 12.Hyperlipidemia. Crestor    LOS (Days) 4 A FACE TO FACE EVALUATION WAS PERFORMED  Charlett Blake 09/04/2017, 8:17 AM

## 2017-09-04 NOTE — Progress Notes (Signed)
Occupational Therapy Session Note  Patient Details  Name: Eugene [name] MRN: [id number] Date of Birth: 01/11/1941  Today's Date: 09/04/2017 OT Individual Time: 1100-1200 and 1400-1410 OT Individual Time Calculation (min): 60 min and 70 min    Short Term Goals: Week 1:  OT Short Term Goal 1 (Week 1): Pt will complete LB dressing with supervision OT Short Term Goal 2 (Week 1): Pt will tolerate standing at the sink in preparation for BADL tasks for 3 mins OT Short Term Goal 3 (Week 1): Pt will integrate R UE within bathing tasks with min cues 75% of the time  Skilled Therapeutic Interventions/Progress Updates:    Session 1: Upon entering the room, pt supine in bed with no c/o pain and agreeable to OT intervention. Pt ambulating from room with min A and use of RW with mod verbal cuing for forward gaze and staying within RW. Pt engaged in two standing tasks with dynavision for 3 minutes and then 2 mins. Focus on use of R UE to hit targets and results showing increased time needed to hit all targets on R side of board for visual scanning. Average reaction time of 4 seconds with each bout. Pt required steady assistance with balance with no major LOB. Pt seated at table for fine motor coordination task with use of B UE's to play "old maid" card game. Pt familiar with game but needed mod multimodal cues for turn taking, problem solving, and visual scanning to the R. Pt returning to room at end of session in same manner as above. Pt seated in recliner chair with chair alarm activated and family member present in room. Call bell and all needed items within reach.   Session 2: Upon entering the room, pt supine in bed with family member present in the room. Pt agreeable to shower this session. Pt ambulating to closet to locate clothing items with mod verbal guidance cues and steady assistance for balance. Pt transferred onto toilet and having diarrhea. Pt's brief also soiled with urine and BM and pt  unaware. OT discussed with RN and decided to place pt onto 2 hr timed toileting.for skin integrity. Pt transferring into shower with steady assistance and completed bathing task while seated on TTB. Pt required mod multimodal cues for sequencing and initiation.  Pt transferred onto commode to don clothing items with steady assistance for safety. Pt seated at sink for grooming tasks with set up A to obtain items. Pt seated in recliner chair to review theraputty exercises with use of rest resistive putty and use of paper handout. Pt required max cuing and pt does not remember any exercises from previous sessions. Pt remained seated in recliner chair with chair alarm activated and call bell within reach. Family member remains present.   Therapy Documentation Precautions:  Precautions Precautions: Fall Restrictions Weight Bearing Restrictions: No General:   Vital Signs:  Pain:   ADL: ADL ADL Comments: Please see functional navigator  See Function Navigator for Current Functional Status.   Therapy/Group: Individual Therapy  Gypsy Decant 09/04/2017, 12:26 PM

## 2017-09-04 NOTE — Progress Notes (Signed)
Physical Therapy Session Note  Patient Details  Name: Eugene [name] MRN: [id number] Date of Birth: 1941-04-05  Today's Date: 09/04/2017 PT Individual Time: 0900-1000 PT Individual Time Calculation (min): 60 min   Short Term Goals: Week 1:  PT Short Term Goal 1 (Week 1): STG =LTG due to ELOS   Skilled Therapeutic Interventions/Progress Updates:   Pt in supine and agreeable to therapy, no c/o pain. Session focused on gait training, pre-gait tasks, and facilitating reciprocal movement pattern. Ambulated to/from therapy gym w/ min guard fading to close supervision w/ verbal cues for increased R step length and foot clearance. Increased speed this session compared to previous sessions. Worked on R pre-gait tasks in gym w/ emphasis on facilitating increased active R hip flexion. Performed 3" reciprocal step taps and seated knee marches. Practiced gait w/ and w/o AD working on carrying out reciprocal movement pattern w/ increased R hip flexion. Used SPC as AD w/ improved balance than w/o AD, however reciprocal movement pattern breaks down w/ SPC despite max visual and verbal cues. Will continue to assess use of LRAD for gait. Performed NuStep 5 min x2 to work on reciprocal movement pattern and increased R body awareness. Occasional cues to use RUE to assist. Returned to room and ended session in recliner, call bell within reach and all needs met.   Therapy Documentation Precautions:  Precautions Precautions: Fall Restrictions Weight Bearing Restrictions: No Vital Signs:    See Function Navigator for Current Functional Status.   Therapy/Group: Individual Therapy  Clements Toro K Arnette 09/04/2017, 10:01 AM

## 2017-09-05 ENCOUNTER — Inpatient Hospital Stay (HOSPITAL_COMMUNITY): Payer: Medicare Other

## 2017-09-05 LAB — GLUCOSE, CAPILLARY
GLUCOSE-CAPILLARY: 107 mg/dL — AB (ref 65–99)
Glucose-Capillary: 108 mg/dL — ABNORMAL HIGH (ref 65–99)
Glucose-Capillary: 99 mg/dL (ref 65–99)
Glucose-Capillary: 131 mg/dL — ABNORMAL HIGH (ref 65–99)

## 2017-09-05 NOTE — Progress Notes (Signed)
Physical Therapy Session Note  Patient Details  Name: Eugene [name] MRN: [id number] Date of Birth: July 05, 1940  Today's Date: 09/05/2017 PT Individual Time: 7939-0300 PT Individual Time Calculation (min): 57 min   Short Term Goals: Week 1:  PT Short Term Goal 1 (Week 1): STG =LTG due to ELOS   Skilled Therapeutic Interventions/Progress Updates:    Pt seated in w/c upon PT arrival, agreeable to therapy tx and denies pain. Pt ambulated from room<>gym x 200 ft each direction with RW and supervision, verbal cues for upright posture and increased step length. Pt ambulated within the gym 2 x 100 ft without AD and with min assist, verbal cues for upright posture, increased step length and awareness. Pt worked on dynamic standing balance while standing on airex to toss horseshoes, no UE support, x 2 trials with min assist to steady. Pt ambulated to rehab apartment with min assist, during R turns pt running into doorways and requiring max cues to avoid obstacles. In rehab apartment pt worked on ambulation and furniture transfers min assist without AD. Pt ambulated from gym>dayroom and used nustep x 5 minutes for global strengthening and reciprocal movement, level 5. Pt ambulated back to room with RW and supervision, left seated in recliner with needs in reach and family present.   Therapy Documentation Precautions:  Precautions Precautions: Fall Restrictions Weight Bearing Restrictions: No   See Function Navigator for Current Functional Status.   Therapy/Group: Individual Therapy  Netta Corrigan, PT, DPT 09/05/2017, 8:00 AM

## 2017-09-05 NOTE — Progress Notes (Signed)
Subjective/Complaints:  Slept well, hungry for breakfast   ROS  Difficult to ellicit due to aphasia  Objective: Vital Signs: Blood pressure (!) 158/74, pulse (!) 55, temperature 98 F (36.7 C), temperature source Oral, resp. rate 17, height 6' (1.829 m), weight 85 kg (187 lb 6.3 oz), SpO2 99 %. No results found. Results for orders placed or performed during the hospital encounter of 08/31/17 (from the past 72 hour(s))  Glucose, capillary     Status: None   Collection Time: 09/02/17 11:37 AM  Result Value Ref Range   Glucose-Capillary 83 65 - 99 mg/dL  Glucose, capillary     Status: Abnormal   Collection Time: 09/02/17  4:49 PM  Result Value Ref Range   Glucose-Capillary 103 (H) 65 - 99 mg/dL   Comment 1 Notify RN   Glucose, capillary     Status: Abnormal   Collection Time: 09/02/17  9:00 PM  Result Value Ref Range   Glucose-Capillary 124 (H) 65 - 99 mg/dL   Comment 1 Notify RN   Glucose, capillary     Status: Abnormal   Collection Time: 09/03/17  7:01 AM  Result Value Ref Range   Glucose-Capillary 106 (H) 65 - 99 mg/dL  Glucose, capillary     Status: None   Collection Time: 09/03/17 12:04 PM  Result Value Ref Range   Glucose-Capillary 73 65 - 99 mg/dL  Glucose, capillary     Status: None   Collection Time: 09/03/17  4:39 PM  Result Value Ref Range   Glucose-Capillary 95 65 - 99 mg/dL  Glucose, capillary     Status: Abnormal   Collection Time: 09/03/17  9:15 PM  Result Value Ref Range   Glucose-Capillary 124 (H) 65 - 99 mg/dL  Glucose, capillary     Status: Abnormal   Collection Time: 09/04/17  6:31 AM  Result Value Ref Range   Glucose-Capillary 125 (H) 65 - 99 mg/dL  Glucose, capillary     Status: None   Collection Time: 09/04/17 11:58 AM  Result Value Ref Range   Glucose-Capillary 94 65 - 99 mg/dL  Glucose, capillary     Status: None   Collection Time: 09/04/17  4:37 PM  Result Value Ref Range   Glucose-Capillary 97 65 - 99 mg/dL  Glucose, capillary      Status: Abnormal   Collection Time: 09/04/17  9:38 PM  Result Value Ref Range   Glucose-Capillary 132 (H) 65 - 99 mg/dL  Glucose, capillary     Status: Abnormal   Collection Time: 09/05/17  6:55 AM  Result Value Ref Range   Glucose-Capillary 108 (H) 65 - 99 mg/dL     HEENT: poor dentition Cardio: irregular and no murmur Resp: CTA B/L and unlabored GI: BS positive and NT, ND Extremity:  No Edema Skin:   Intact Neuro: Abnormal Sensory limited by aphasia, Abnormal Motor 4/5 R delt , bi,tri, grip, HF, KE ,ADF, 5/5 on Left, Dysarthric and Aphasic Contralateral ideomotor apraxia  Musc/Skel:  Other no pain with UE or LE ROM Neg shoulder impingement on left, no pain to palpation Left clvicle, no pain with jaw opening, no pain with neck ROM No facial swelling or erythema , no lesions except keratoses Gen NAD   Assessment/Plan: 1. 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 Physiatrist is providing close team supervision and 24 hour management of active medical problems listed below. Physiatrist and rehab team continue  to assess barriers to discharge/monitor patient progress toward functional and medical goals. FIM: Function - Bathing Position: Shower Body parts bathed by patient: Right arm, Left arm, Chest, Abdomen, Front perineal area, Right upper leg, Left upper leg, Buttocks Body parts bathed by helper: Right lower leg, Left lower leg Assist Level: Touching or steadying assistance(Pt > 75%)  Function- Upper Body Dressing/Undressing What is the patient wearing?: Pull over shirt/dress Pull over shirt/dress - Perfomed by patient: Thread/unthread right sleeve, Thread/unthread left sleeve, Put head through opening, Pull shirt over trunk Pull over shirt/dress - Perfomed by helper: Pull shirt over trunk Assist Level: Supervision or verbal cues Function - Lower Body  Dressing/Undressing What is the patient wearing?: Pants, Non-skid slipper socks Position: Other (comment)(commode) Pants- Performed by patient: Thread/unthread right pants leg, Thread/unthread left pants leg, Pull pants up/down Pants- Performed by helper: Pull pants up/down, Fasten/unfasten pants Non-skid slipper socks- Performed by patient: Don/doff right sock, Don/doff left sock Assist for footwear: Supervision/touching assist Assist for lower body dressing: Touching or steadying assistance (Pt > 75%)  Function - Toileting Toileting steps completed by patient: Adjust clothing prior to toileting, Performs perineal hygiene, Adjust clothing after toileting Toileting steps completed by helper: Performs perineal hygiene, Adjust clothing after toileting Toileting Assistive Devices: Grab bar or rail Assist level: Touching or steadying assistance (Pt.75%)  Function - Air cabin crew transfer assistive device: Grab bar Assist level to toilet: Touching or steadying assistance (Pt > 75%) Assist level from toilet: Touching or steadying assistance (Pt > 75%)  Function - Chair/bed transfer Chair/bed transfer method: Stand pivot, Ambulatory Chair/bed transfer assist level: Touching or steadying assistance (Pt > 75%) Chair/bed transfer assistive device: Walker Chair/bed transfer details: Verbal cues for precautions/safety, Verbal cues for safe use of DME/AE, Verbal cues for technique  Function - Locomotion: Wheelchair Type: Manual Max wheelchair distance: 133ft  Assist Level: Supervision or verbal cues Assist Level: Supervision or verbal cues Wheel 150 feet activity did not occur: Safety/medical concerns Turns around,maneuvers to table,bed, and toilet,negotiates 3% grade,maneuvers on rugs and over doorsills: No Function - Locomotion: Ambulation Assistive device: Walker-rolling Max distance: 100' Assist level: Touching or steadying assistance (Pt > 75%) Assist level: Touching or  steadying assistance (Pt > 75%) Assist level: Touching or steadying assistance (Pt > 75%) Walk 150 feet activity did not occur: Safety/medical concerns Assist level: Touching or steadying assistance (Pt > 75%) Walk 10 feet on uneven surfaces activity did not occur: Safety/medical concerns  Function - Comprehension Comprehension: Auditory Comprehension assist level: Understands basic 50 - 74% of the time/ requires cueing 25 - 49% of the time  Function - Expression Expression: Verbal Expression assist level: Expresses basic 50 - 74% of the time/requires cueing 25 - 49% of the time. Needs to repeat parts of sentences.  Function - Social Interaction Social Interaction assist level: Interacts appropriately 75 - 89% of the time - Needs redirection for appropriate language or to initiate interaction.  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 Plan follow-up CT 2-3 weeks if ICH resolves add Eliquis to aspirin secondary to history of atrial fibrillation -  Cont CIR PT, OT, SLP 2. DVT Prophylaxis/Anticoagulation: Subcutaneous heparin initiated 08/28/2017 3. Pain Management:Tylenol as needed,4. Mood:Paxil 20 mg daily, Seroquel 25 mg nightly Xanax 0.5 mg 3 times daily as needed 5. Neuropsych: This patientiscapable of making decisions on hisown behalf. 6. Skin/Wound Care:Routine skin checks 7. Fluids/Electrolytes/Nutrition:Routine I&O's with follow-up chemistriesupon admit, BMET reviewed no signs of renal insuff, monitor po fluid intake, I 471ml 8.Atrial fibrillation. Aspirin to be  initiated 81 mg 09/01/2017. Cardiac rate controlled 9.Hypertension. Norvasc 10 mg daily, lisinopril 20 mg daily, Lopressor 12.5 mg twice daily Vitals:   09/04/17 1536 09/05/17 0522  BP: 106/62 (!) 158/74  Pulse: (!) 59 (!) 55  Resp: 18 17  Temp: 98.2 F (36.8 C) 98 F (36.7 C)  SpO2: 99% 99%  BPs labile , monitor 10.Prediabetic. SSI. CBGs normal 11.BPH. Proscar 5 mg daily. Check PVR x3 12.Hyperlipidemia. Crestor    LOS (Days) 5 A FACE TO FACE EVALUATION WAS PERFORMED  Charlett Blake 09/05/2017, 8:03 AM

## 2017-09-06 ENCOUNTER — Inpatient Hospital Stay (HOSPITAL_COMMUNITY): Payer: Medicare Other | Admitting: Physical Therapy

## 2017-09-06 ENCOUNTER — Inpatient Hospital Stay (HOSPITAL_COMMUNITY): Payer: Medicare Other | Admitting: Speech Pathology

## 2017-09-06 ENCOUNTER — Inpatient Hospital Stay (HOSPITAL_COMMUNITY): Payer: Medicare Other

## 2017-09-06 ENCOUNTER — Inpatient Hospital Stay (HOSPITAL_COMMUNITY): Payer: Medicare Other | Admitting: Occupational Therapy

## 2017-09-06 LAB — GLUCOSE, CAPILLARY
GLUCOSE-CAPILLARY: 96 mg/dL (ref 65–99)
GLUCOSE-CAPILLARY: 97 mg/dL (ref 65–99)
GLUCOSE-CAPILLARY: 112 mg/dL — AB (ref 65–99)

## 2017-09-06 NOTE — Progress Notes (Signed)
Patient expressing feelings of depression and anxiety, states he desires to "talk to someone". Karlyne Greenspan (Education officer, [employer]) notified via phone message regarding possible Neuro-Psych Visit.

## 2017-09-06 NOTE — Progress Notes (Signed)
Physical Therapy Session Note  Patient Details  Name: Eugene [name] MRN: [id number] Date of Birth: 08-12-1940  Today's Date: 09/06/2017 PT Individual Time: 1100-1157 PT Individual Time Calculation (min): 57 min   Short Term Goals: Week 1:  PT Short Term Goal 1 (Week 1): STG =LTG due to ELOS   Skilled Therapeutic Interventions/Progress Updates:    Pt supine in bed upon PT arrival, agreeable to therapy tx and denies pain. Pt transferred to sitting and standing with supervision, ambulated to gym with RW and min assist, verbal cues for awareness. Pt worked on standing balance and cognition in order to perform card matching activity while standing on airex without UE support, occasional verbal cues to find correct match, verbal cues for upright posture. Pt standing on blue wedge for heel cord stretch 2 x 30 sec, increased posterior lean with verbal cues to correct. Pt worked on standing balance and cognition to perform peg board puzzle, requiring mod verbal cues for correct placement/colors. Pt worked on dynamic balance without UE support to perform toe taps on 4 inch step, mod assist and decreased clearance with R LE. Pt worked on dynamic standing balance and hip abductor strengthening to perform side steps over object, increased difficulty clearing object with R LE, min assist for balance. Pt ambulated from gym back to room with RW and min assist, verbal cues for safety and awareness. Pt left seated in recliner with needs in reach and daughter present, QRB in place and chair alarm set.   Therapy Documentation Precautions:  Precautions Precautions: Fall Restrictions Weight Bearing Restrictions: No   See Function Navigator for Current Functional Status.   Therapy/Group: Individual Therapy  Netta Corrigan, PT, DPT 09/06/2017, 7:49 AM

## 2017-09-06 NOTE — Progress Notes (Signed)
Subjective/Complaints: No issues overnte  ROS  Difficult to ellicit due to aphasia  Objective: Vital Signs: Blood pressure (!) 158/86, pulse (!) 59, temperature 98.2 F (36.8 C), resp. rate 17, height 6' (1.829 m), weight 85 kg (187 lb 6.3 oz), SpO2 99 %. No results found. Results for orders placed or performed during the hospital encounter of 08/31/17 (from the past 72 hour(s))  Glucose, capillary     Status: None   Collection Time: 09/03/17 12:04 PM  Result Value Ref Range   Glucose-Capillary 73 65 - 99 mg/dL  Glucose, capillary     Status: None   Collection Time: 09/03/17  4:39 PM  Result Value Ref Range   Glucose-Capillary 95 65 - 99 mg/dL  Glucose, capillary     Status: Abnormal   Collection Time: 09/03/17  9:15 PM  Result Value Ref Range   Glucose-Capillary 124 (H) 65 - 99 mg/dL  Glucose, capillary     Status: Abnormal   Collection Time: 09/04/17  6:31 AM  Result Value Ref Range   Glucose-Capillary 125 (H) 65 - 99 mg/dL  Glucose, capillary     Status: None   Collection Time: 09/04/17 11:58 AM  Result Value Ref Range   Glucose-Capillary 94 65 - 99 mg/dL  Glucose, capillary     Status: None   Collection Time: 09/04/17  4:37 PM  Result Value Ref Range   Glucose-Capillary 97 65 - 99 mg/dL  Glucose, capillary     Status: Abnormal   Collection Time: 09/04/17  9:38 PM  Result Value Ref Range   Glucose-Capillary 132 (H) 65 - 99 mg/dL  Glucose, capillary     Status: Abnormal   Collection Time: 09/05/17  6:55 AM  Result Value Ref Range   Glucose-Capillary 108 (H) 65 - 99 mg/dL  Glucose, capillary     Status: None   Collection Time: 09/05/17 11:51 AM  Result Value Ref Range   Glucose-Capillary 99 65 - 99 mg/dL  Glucose, capillary     Status: Abnormal   Collection Time: 09/05/17  4:35 PM  Result Value Ref Range   Glucose-Capillary 107 (H) 65 - 99 mg/dL  Glucose, capillary     Status: Abnormal   Collection Time: 09/05/17  9:26 PM  Result Value Ref Range    Glucose-Capillary 131 (H) 65 - 99 mg/dL  Glucose, capillary     Status: None   Collection Time: 09/06/17  6:37 AM  Result Value Ref Range   Glucose-Capillary 96 65 - 99 mg/dL   Comment 1 Notify RN      HEENT: poor dentition Cardio: irregular and no murmur Resp: CTA B/L and unlabored GI: BS positive and NT, ND Extremity:  No Edema Skin:   Intact Neuro: Abnormal Sensory limited by aphasia, Abnormal Motor 4+/5 R delt , bi,tri, grip, HF, KE ,ADF, 5/5 on Left, Dysarthric and Aphasic Contralateral ideomotor apraxia  Musc/Skel:  Other no pain with UE or LE ROM Neg shoulder impingement on left, no pain to palpation Left clvicle, no pain with jaw opening, no pain with neck ROM No facial swelling or erythema , no lesions except keratoses Gen NAD   Assessment/Plan: 1. 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 Physiatrist is providing close team supervision and 24 hour management of active medical problems listed below. Physiatrist and rehab team continue to assess barriers to discharge/monitor patient progress toward functional and medical goals. FIM: Function -  Bathing Position: Shower Body parts bathed by patient: Right arm, Left arm, Chest, Abdomen, Front perineal area, Right upper leg, Left upper leg, Buttocks Body parts bathed by helper: Right lower leg, Left lower leg Assist Level: Touching or steadying assistance(Pt > 75%)  Function- Upper Body Dressing/Undressing What is the patient wearing?: Pull over shirt/dress Pull over shirt/dress - Perfomed by patient: Thread/unthread right sleeve, Thread/unthread left sleeve, Put head through opening, Pull shirt over trunk Pull over shirt/dress - Perfomed by helper: Pull shirt over trunk Assist Level: Supervision or verbal cues Function - Lower Body Dressing/Undressing What is the patient wearing?: Pants, Non-skid slipper  socks Position: Other (comment)(commode) Pants- Performed by patient: Thread/unthread right pants leg, Thread/unthread left pants leg, Pull pants up/down Pants- Performed by helper: Pull pants up/down, Fasten/unfasten pants Non-skid slipper socks- Performed by patient: Don/doff right sock, Don/doff left sock Assist for footwear: Supervision/touching assist Assist for lower body dressing: Touching or steadying assistance (Pt > 75%)  Function - Toileting Toileting steps completed by patient: Adjust clothing prior to toileting Toileting steps completed by helper: Performs perineal hygiene, Adjust clothing after toileting Toileting Assistive Devices: Grab bar or rail Assist level: Touching or steadying assistance (Pt.75%)  Function - Air cabin crew transfer assistive device: Grab bar Assist level to toilet: Touching or steadying assistance (Pt > 75%) Assist level from toilet: Touching or steadying assistance (Pt > 75%)  Function - Chair/bed transfer Chair/bed transfer method: Stand pivot, Ambulatory Chair/bed transfer assist level: Touching or steadying assistance (Pt > 75%) Chair/bed transfer assistive device: Bedrails, Armrests Chair/bed transfer details: Verbal cues for precautions/safety, Verbal cues for safe use of DME/AE, Verbal cues for technique  Function - Locomotion: Wheelchair Type: Manual Max wheelchair distance: 174ft  Assist Level: Supervision or verbal cues Assist Level: Supervision or verbal cues Wheel 150 feet activity did not occur: Safety/medical concerns Turns around,maneuvers to table,bed, and toilet,negotiates 3% grade,maneuvers on rugs and over doorsills: No Function - Locomotion: Ambulation Assistive device: Walker-rolling Max distance: 200 ft Assist level: Supervision or verbal cues Assist level: Supervision or verbal cues Assist level: Supervision or verbal cues Walk 150 feet activity did not occur: Safety/medical concerns Assist level:  Supervision or verbal cues Walk 10 feet on uneven surfaces activity did not occur: Safety/medical concerns  Function - Comprehension Comprehension: Auditory Comprehension assist level: Understands basic 50 - 74% of the time/ requires cueing 25 - 49% of the time  Function - Expression Expression: Verbal Expression assist level: Expresses basic 50 - 74% of the time/requires cueing 25 - 49% of the time. Needs to repeat parts of sentences.  Function - Social Interaction Social Interaction assist level: Interacts appropriately 75 - 89% of the time - Needs redirection for appropriate language or to initiate interaction.  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 Plan follow-up CT 2-3 weeks if ICH resolves add Eliquis to aspirin secondary to history of atrial fibrillation -Cont CIR PT, OT, SLP 2. DVT Prophylaxis/Anticoagulation: Subcutaneous heparin initiated 08/28/2017 3. Pain Management:Tylenol as needed,4. Mood:Paxil 20 mg daily, Seroquel 25 mg nightly Xanax 0.5 mg 3 times daily as  needed 5. Neuropsych: This patientiscapable of making decisions on hisown behalf. 6. Skin/Wound Care:Routine skin checks 7. Fluids/Electrolytes/Nutrition:Routine I&O's with follow-up chemistriesupon admit, BMET reviewed no signs of renal insuff, monitor po fluid intake, I 487ml 8.Atrial fibrillation. Aspirin to be initiated 81 mg 09/01/2017. Cardiac rate controlled 9.Hypertension. Norvasc 10 mg daily, lisinopril 20 mg daily, Lopressor 12.5 mg twice daily Vitals:   09/05/17 1425 09/06/17 0546   BP: (!) 144/80 (!) 158/86  Pulse: 64 (!) 59  Resp: 18 17  Temp: 98.1 F (36.7 C) 98.2 F (36.8 C)  SpO2: 100% 99%  BPs labile ,systolic elevated just started HCTZ on 4/21   10.Prediabetic. SSI. CBGs normal 11.BPH. Proscar 5 mg daily. Check PVR x3 12.Hyperlipidemia. Crestor    LOS (Days) 6 A FACE TO FACE EVALUATION WAS PERFORMED  Charlett Blake 09/06/2017, 8:43 AM

## 2017-09-06 NOTE — Progress Notes (Signed)
Speech Language Pathology Daily Session Note  Patient Details  Name: Eugene [name] MRN: [id number] Date of Birth: 04-Mar-1941  Today's Date: 09/06/2017  Session 1: SLP Individual Time: 1000-1030 SLP Individual Time Calculation (min): 30 min   Session 2: SLP Individual Time: 5364-6803 SLP Individual Time Calculation (min): 30 min  Short Term Goals: Week 1: SLP Short Term Goal 1 (Week 1): Pt will follow 2-step directions during functional tasks with Min A verbal cues.  SLP Short Term Goal 2 (Week 1): Pt will demonstrate word-finding at the phrase level with Min A multimodal cues.  SLP Short Term Goal 3 (Week 1): Pt will self-monitor and correct verbal errors in 50% opportunities with Min A verbal cues.   Skilled Therapeutic Interventions:   Session 1: Skilled treatment session focused on communication goals. SLP facilitated session by providing Mod A verbal and visual cues for word-finding at the phrase level. Pt able to self-monitor verbal errors in 80% opportunities; however, required Mod A verbal cues to correct them. Pt also reported "I don't know" more frequently compared to last session, and required Mod A verbal cues for redirection. Pt unable to follow 2-step directions, but followed 1-step directions with Supervision verbal cues. Pt left upright in recliner with quick release belt on and daughter present. Continue with current plan of care.     Session 2: Skilled treatment session focused on communication goals. SLP facilitated session by providing Supervision verbal cues for word-finding at the phrase level during a sentence completion task. Pt able to self-monitor and correct verbal errors in 60% opportunities with Min A verbal cues. Pt appeared more motivated compared to last session, probably due to basic directions and interest. Pt left upright in recliner with quick release belt on and daughter present. Continue with current plan of care.     Function:  Cognition Comprehension Comprehension assist level: Understands basic 50 - 74% of the time/ requires cueing 25 - 49% of the time  Expression   Expression assist level: Expresses basic 50 - 74% of the time/requires cueing 25 - 49% of the time. Needs to repeat parts of sentences.  Social Interaction Social Interaction assist level: Interacts appropriately 75 - 89% of the time - Needs redirection for appropriate language or to initiate interaction.  Problem Solving Problem solving assist level: Solves basic 50 - 74% of the time/requires cueing 25 - 49% of the time  Memory Memory assist level: Recognizes or recalls 50 - 74% of the time/requires cueing 25 - 49% of the time    Pain Pain Assessment Pain Score: 0-No pain  Therapy/Group: Individual Therapy  Meredeth Ide  SLP - Student 09/06/2017, 12:48 PM

## 2017-09-06 NOTE — Progress Notes (Signed)
Physical Therapy Session Note  Patient Details  Name: Eugene [name] MRN: [id number] Date of Birth: 1940/11/06  Today's Date: 09/06/2017 PT Individual Time: 0800-0830 PT Individual Time Calculation (min): 30 min   Short Term Goals: Week 1:  PT Short Term Goal 1 (Week 1): STG =LTG due to ELOS   Skilled Therapeutic Interventions/Progress Updates: Pt received sidelying in bed attempting to eat breakfast lying down; with encouragement pt willing to come to EOB with S and increased time to complete self feeding in sitting for safety. Reviewed therapy schedule with patient. Pt noted to be incontinent of urine in brief/bed. Gait to bathroom with RW and S. Standing balance with S while urinating and performing peri hygiene with setupA. Donned brief totalA in standing with S for balance. Pt dons clean pants and shirt setupA. Gait x300' with RW and S; engaged pt in cognitive dual task discussion regarding hobbies and activities he enjoys at home. Note decreased control of RW when talking with therapist and when distracted by surroundings requiring moderate verbal cues for technique and safety. Remained seated in recliner at end of session, quick release belt and chair alarm intact, all needs in reach.      Therapy Documentation Precautions:  Precautions Precautions: Fall Restrictions Weight Bearing Restrictions: No   See Function Navigator for Current Functional Status.   Therapy/Group: Individual Therapy  Luberta Mutter 09/06/2017, 9:00 AM

## 2017-09-06 NOTE — Progress Notes (Signed)
Occupational Therapy Session Note  Patient Details  Name: Eugene [name] MRN: [id number] Date of Birth: 1940/07/10  Today's Date: 09/06/2017 OT Individual Time: 0626-9485 OT Individual Time Calculation (min): 55 min   Short Term Goals: Week 1:  OT Short Term Goal 1 (Week 1): Pt will complete LB dressing with supervision OT Short Term Goal 2 (Week 1): Pt will tolerate standing at the sink in preparation for BADL tasks for 3 mins OT Short Term Goal 3 (Week 1): Pt will integrate R UE within bathing tasks with min cues 75% of the time  Skilled Therapeutic Interventions/Progress Updates:    Pt greeted sitting in recliner and agreeable to OT. Pt declined to go to the bathroom stating he just went. Pt ambulated into bathrom with RW and min guard A. Max multimodal cues for RW positioning when turning to sit on shower bench. Pt withpoor safety awareness and needs max cues for safety. Bathing completed with overall set-up A and min guard for balance when standing to wash buttocks. Dressing completed with increased time but overal set-up A. Even able to fasten pants in standing with close supervision for balance. Pt brought to therapy apartment in wc for time management. Practiced ambulating w/ RW in simulated home environment and tub transfer bench. Dynamic standing and BUE coordination with towel folding task. Pt unable to recall how to get back to room and worked on pathfinding and memory strategies to locate landmarks to help pt recall pathway. Pt left seated in recliner at end of session with safety belt, chair alarm, and needs met.   Therapy Documentation Precautions:  Precautions Precautions: Fall Restrictions Weight Bearing Restrictions: No Pain:  none/denies pain ADL: ADL ADL Comments: Please see functional navigator  See Function Navigator for Current Functional Status.   Therapy/Group: Individual Therapy  Valma Cava 09/06/2017, 9:25 AM

## 2017-09-07 ENCOUNTER — Inpatient Hospital Stay (HOSPITAL_COMMUNITY): Payer: Medicare Other | Admitting: Speech Pathology

## 2017-09-07 ENCOUNTER — Inpatient Hospital Stay (HOSPITAL_COMMUNITY): Payer: Medicare Other | Admitting: Physical Therapy

## 2017-09-07 ENCOUNTER — Inpatient Hospital Stay (HOSPITAL_COMMUNITY): Payer: Medicare Other | Admitting: Occupational Therapy

## 2017-09-07 ENCOUNTER — Inpatient Hospital Stay (HOSPITAL_COMMUNITY): Payer: Medicare Other

## 2017-09-07 DIAGNOSIS — R7303 Prediabetes: Secondary | ICD-10-CM

## 2017-09-07 DIAGNOSIS — N400 Enlarged prostate without lower urinary tract symptoms: Secondary | ICD-10-CM

## 2017-09-07 LAB — GLUCOSE, CAPILLARY
GLUCOSE-CAPILLARY: 125 mg/dL — AB (ref 65–99)
GLUCOSE-CAPILLARY: 104 mg/dL — AB (ref 65–99)
GLUCOSE-CAPILLARY: 84 mg/dL (ref 65–99)

## 2017-09-07 NOTE — Progress Notes (Signed)
Occupational Therapy Weekly Progress Note  Patient Details  Name: Eugene [name] MRN: [id number] Date of Birth: 1941/03/08  Beginning of progress report period: August 31, 2017 End of progress report period: September 07, 2017  Today's Date: 09/07/2017 OT Individual Time: 4854-6270 OT Individual Time Calculation (min): 57 min   Patient has met 3 of 3 short term goals.  Pt is making steady progress with OT treatments at this time.  He is able to complete all transfers with use of the RW and min guard assist. UE function is improved but continues to need increased time for fastening belt and pants. Pt continues to have R inattention and poor carryover of safety techniques, however improved since eval.  Supervision for sit to to stand with mod/max instructional cueing for hand placement.  Will continue with current OT treatment POC.    Patient continues to demonstrate the following deficits: muscle weakness, impaired timing and sequencing and decreased coordination, decreased attention to right and right side neglect, decreased attention, decreased awareness, decreased problem solving, decreased safety awareness and decreased memory and decreased standing balance, decreased postural control and decreased balance strategies and therefore will continue to benefit from skilled OT intervention to enhance overall performance with BADL and Reduce care partner burden.  Patient progressing toward long term goals..  Continue plan of care.  OT Short Term Goals Week 1:  OT Short Term Goal 1 (Week 1): Pt will complete LB dressing with supervision OT Short Term Goal 1 - Progress (Week 1): Met OT Short Term Goal 2 (Week 1): Pt will tolerate standing at the sink in preparation for BADL tasks for 3 mins OT Short Term Goal 2 - Progress (Week 1): Met OT Short Term Goal 3 (Week 1): Pt will integrate R UE within bathing tasks with min cues 75% of the time OT Short Term Goal 3 - Progress (Week 1): Met Week 2:  OT  Short Term Goal 1 (Week 2): STG-LTG 2/2 ELOS  Skilled Therapeutic Interventions/Progress Updates:    Pt greeted sitting in wc and agreeable to OT. Asked pt if he needed to go to the bathroom and he stated "no." Pt ambulated to bathroom to transfer into shower with RW and CGA. Max cues for safe use of RW when turning with no carryover of safety techniques. Pt noted to be incontinent in brief. Pt stated he did not know he had used to the bathroom and didn't feel like he needed to go.  Utilized questioning cues during bathing tasks to help him remember to wash all body parts. Pt stated "I don't know" to all questioning cues, then became frustrated stating " why are you asking me so many dang questions?"  Instructional cues used for the rest of the session. Pt continued to get frustrated with OT instructing pt on safe use of RW and hand placement with sit<>stands. Pt completed 5 mins x2 on [employer] arm bike, then ambulated back to room in similar fashion. Pt left seated in recliner with safety belt on, chair alarm, and call bell in reach.   Therapy Documentation Precautions:  Precautions Precautions: Fall Restrictions Weight Bearing Restrictions: No Pain: Pain Assessment Pain Scale: 0-10 Pain Score: 0-No pain ADL: ADL ADL Comments: Please see functional navigator  See Function Navigator for Current Functional Status.   Therapy/Group: Individual Therapy  Valma Cava 09/07/2017, 8:17 AM

## 2017-09-07 NOTE — Progress Notes (Signed)
Subjective/Complaints: Pt seen sitting up in his chair this Am.  He states she slept well overnight.  Family at bedside, who indicate patient improving.  Pt states he would like to go home.    ROS  Denies CP, SOB, N/V/D.  Objective: Vital Signs: Blood pressure 109/85, pulse 70, temperature 98.6 F (37 C), temperature source Oral, resp. rate 18, height 6' (1.829 m), weight 85 kg (187 lb 6.3 oz), SpO2 96 %. No results found. Results for orders placed or performed during the hospital encounter of 08/31/17 (from the past 72 hour(s))  Glucose, capillary     Status: None   Collection Time: 09/04/17 11:58 AM  Result Value Ref Range   Glucose-Capillary 94 65 - 99 mg/dL  Glucose, capillary     Status: None   Collection Time: 09/04/17  4:37 PM  Result Value Ref Range   Glucose-Capillary 97 65 - 99 mg/dL  Glucose, capillary     Status: Abnormal   Collection Time: 09/04/17  9:38 PM  Result Value Ref Range   Glucose-Capillary 132 (H) 65 - 99 mg/dL  Glucose, capillary     Status: Abnormal   Collection Time: 09/05/17  6:55 AM  Result Value Ref Range   Glucose-Capillary 108 (H) 65 - 99 mg/dL  Glucose, capillary     Status: None   Collection Time: 09/05/17 11:51 AM  Result Value Ref Range   Glucose-Capillary 99 65 - 99 mg/dL  Glucose, capillary     Status: Abnormal   Collection Time: 09/05/17  4:35 PM  Result Value Ref Range   Glucose-Capillary 107 (H) 65 - 99 mg/dL  Glucose, capillary     Status: Abnormal   Collection Time: 09/05/17  9:26 PM  Result Value Ref Range   Glucose-Capillary 131 (H) 65 - 99 mg/dL  Glucose, capillary     Status: None   Collection Time: 09/06/17  6:37 AM  Result Value Ref Range   Glucose-Capillary 96 65 - 99 mg/dL   Comment 1 Notify RN   Glucose, capillary     Status: None   Collection Time: 09/06/17 12:00 PM  Result Value Ref Range   Glucose-Capillary 97 65 - 99 mg/dL  Glucose, capillary     Status: Abnormal   Collection Time: 09/06/17  4:38 PM  Result  Value Ref Range   Glucose-Capillary 112 (H) 65 - 99 mg/dL  Glucose, capillary     Status: Abnormal   Collection Time: 09/07/17  6:30 AM  Result Value Ref Range   Glucose-Capillary 125 (H) 65 - 99 mg/dL    Gen NAD. Vital signs reviewed. HENT: poor dentition. Normocephalic, atraumatic.  Eyes: EOMI. No discharge. Cardio: irregular and no murmur Resp: CTA B/L and unlabored GI: BS positive and ND Musc/Skel:  No edema, no tenderness Neuro: Alert. Motor: 4+/5 R delt , bi,tri, grip, HF, KE ,ADF, 5/5 on Left, Dysarthric (stable)  Aphasia Skin:   Intact. Warm and Dry.  Assessment/Plan: 1. 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 Physiatrist is providing close team supervision and 24 hour management of active medical problems listed below. Physiatrist and rehab team continue to assess barriers to discharge/monitor patient progress toward functional and medical goals. FIM: Function - Bathing Position: Shower Body parts bathed by patient: Right arm, Left arm, Chest, Abdomen, Front perineal area, Right upper leg, Left upper leg, Buttocks Body parts bathed by helper: Left lower leg, Right lower leg Assist  Level: Touching or steadying assistance(Pt > 75%)  Function- Upper Body Dressing/Undressing What is the patient wearing?: Pull over shirt/dress Pull over shirt/dress - Perfomed by patient: Thread/unthread right sleeve, Thread/unthread left sleeve, Put head through opening, Pull shirt over trunk Pull over shirt/dress - Perfomed by helper: Pull shirt over trunk Assist Level: Supervision or verbal cues Function - Lower Body Dressing/Undressing What is the patient wearing?: Pants, Non-skid slipper socks Position: Other (comment)(commode) Pants- Performed by patient: Pull pants up/down, Thread/unthread left pants leg, Thread/unthread right pants leg Pants- Performed by helper: Pull  pants up/down, Fasten/unfasten pants Non-skid slipper socks- Performed by patient: Don/doff left sock, Don/doff right sock Assist for footwear: Supervision/touching assist Assist for lower body dressing: Touching or steadying assistance (Pt > 75%)  Function - Toileting Toileting steps completed by patient: Adjust clothing prior to toileting, Adjust clothing after toileting Toileting steps completed by helper: Performs perineal hygiene Toileting Assistive Devices: Grab bar or rail Assist level: Touching or steadying assistance (Pt.75%)  Function - Air cabin crew transfer assistive device: Grab bar Assist level to toilet: Touching or steadying assistance (Pt > 75%) Assist level from toilet: Touching or steadying assistance (Pt > 75%)  Function - Chair/bed transfer Chair/bed transfer method: Stand pivot, Ambulatory Chair/bed transfer assist level: Touching or steadying assistance (Pt > 75%) Chair/bed transfer assistive device: Armrests Chair/bed transfer details: Verbal cues for precautions/safety, Verbal cues for safe use of DME/AE, Verbal cues for technique  Function - Locomotion: Wheelchair Type: Manual Max wheelchair distance: 120ft  Assist Level: Supervision or verbal cues Assist Level: Supervision or verbal cues Wheel 150 feet activity did not occur: Safety/medical concerns Turns around,maneuvers to table,bed, and toilet,negotiates 3% grade,maneuvers on rugs and over doorsills: No Function - Locomotion: Ambulation Assistive device: Walker-rolling Max distance: 150 ft Assist level: Touching or steadying assistance (Pt > 75%) Assist level: Touching or steadying assistance (Pt > 75%) Assist level: Touching or steadying assistance (Pt > 75%) Walk 150 feet activity did not occur: Safety/medical concerns Assist level: Touching or steadying assistance (Pt > 75%) Walk 10 feet on uneven surfaces activity did not occur: Safety/medical concerns  Function -  Comprehension Comprehension: Auditory Comprehension assist level: Understands basic 50 - 74% of the time/ requires cueing 25 - 49% of the time  Function - Expression Expression: Verbal Expression assist level: Expresses basic 50 - 74% of the time/requires cueing 25 - 49% of the time. Needs to repeat parts of sentences.  Function - Social Interaction Social Interaction assist level: Interacts appropriately 75 - 89% of the time - Needs redirection for appropriate language or to initiate interaction.  Function - Problem Solving Problem solving assist level: Solves basic 50 - 74% of the time/requires cueing 25 - 49% of the time  Function - Memory Memory assist level: Recognizes or recalls 50 - 74% of the time/requires cueing 25 - 49% of the time Patient normally able to recall (first 3 days only): That he or she is in a hospital  Medical Problem List and Plan: 1.Right sided weakness, apraxia and aphasiasecondary to left thalamic ICH. Plan follow-up CT 2-3 weeks if ICH resolves add Eliquis to aspirin secondary to history of atrial fibrillation  Cont CIR   Notes reviewed, images reviewed, labs reviewed 2. DVT Prophylaxis/Anticoagulation: Subcutaneous heparin initiated 08/28/2017 3. Pain Management:Tylenol as needed 4. Mood:Paxil 20 mg daily, Seroquel 25 mg nightly Xanax 0.5 mg 3 times daily as needed  Will attempt to wean 5. Neuropsych: This patientisnot fully capable of making decisions on hisown behalf.  6. Skin/Wound Care:Routine skin checks 7. Fluids/Electrolytes/Nutrition:Routine I&O's   BMP within acceptable range on 4/17 8.Atrial fibrillation. Aspirin to be initiated 81 mg 09/01/2017. Cardiac rate controlled 9.Hypertension. Norvasc 10 mg daily, lisinopril 20 mg daily, Lopressor 12.5 mg twice daily  HCTZ started on 4/21 Vitals:   09/07/17 0035 09/07/17 0803  BP: 120/72 109/85  Pulse: (!) 55 70  Resp: 18   Temp: 98.6 F (37 C)   SpO2: 96%    Relatively  controlled on 4/23 10.Prediabetic. SSI.   CBGs relatively controlled on 4/23 11.BPH. Proscar 5 mg daily.    PVRs ordered 12.Hyperlipidemia. Crestor   LOS (Days) 7 A FACE TO FACE EVALUATION WAS PERFORMED  Maurisha Mongeau Lorie Phenix 09/07/2017, 10:48 AM

## 2017-09-07 NOTE — Progress Notes (Signed)
Speech Language Pathology Daily Session Note  Patient Details  Name: Eugene [name] MRN: [id number] Date of Birth: January 28, 1941  Today's Date: 09/07/2017  Session 1: SLP Individual Time: 0930-1000 SLP Individual Time Calculation (min): 30 min   Session 2: SLP Individual Time: 1345-1430 SLP Individual Time Calculation (min): 45 min    Short Term Goals: Week 1: SLP Short Term Goal 1 (Week 1): Pt will follow 2-step directions during functional tasks with Min A verbal cues.  SLP Short Term Goal 2 (Week 1): Pt will demonstrate word-finding at the phrase level with Min A multimodal cues.  SLP Short Term Goal 3 (Week 1): Pt will self-monitor and correct verbal errors in 50% opportunities with Min A verbal cues.   Skilled Therapeutic Interventions:   Session 1: Skilled treatment session focused on communication goals. SLP facilitated session by providing Mod A faded to Supervision verbal cues for word-finding at the phrase level during a picture description task. Pt demonstrated perseveration x2, but was easily redirected with Supervision verbal cues. Pt able to self-monitor semantic paraphasias in 80% opportunities, but required Mod A verbal cues to correct them. Pt left upright in recliner with quick release belt on and daughter present. Continue with current plan of care.   Session 2: Skilled treatment session focused on communication goals. SLP facilitated session by providing Supervision verbal cues for word-finding at the phrase level during a sentence completion task. Pt able to self-monitor verbal errors with Mod I and required Supervision verbal cues to correct them. Throughout task, pt followed 2-step directions with Mod A verbal cues. Pt left upright in recliner with quick release belt on and daughter present. Continue with current plan of care.      Function:  Cognition Comprehension Comprehension assist level: Understands basic 50 - 74% of the time/ requires cueing 25 - 49% of the  time  Expression   Expression assist level: Expresses basic 75 - 89% of the time/requires cueing 10 - 24% of the time. Needs helper to occlude trach/needs to repeat words.  Social Interaction Social Interaction assist level: Interacts appropriately 75 - 89% of the time - Needs redirection for appropriate language or to initiate interaction.  Problem Solving Problem solving assist level: Solves basic 75 - 89% of the time/requires cueing 10 - 24% of the time  Memory Memory assist level: Recognizes or recalls 75 - 89% of the time/requires cueing 10 - 24% of the time    Pain No/denies pain   Therapy/Group: Individual Therapy  Meredeth Ide  SLP - Student 09/07/2017, 3:08 PM

## 2017-09-07 NOTE — Progress Notes (Signed)
Physical Therapy Session Note  Patient Details  Name: DEANTRE BOURDON MRN: [id number] Date of Birth: 29-Mar-1941  Today's Date: 09/07/2017 PT Individual Time: 1540-1650 PT Individual Time Calculation (min): 70 min   Short Term Goals: Week 1:  PT Short Term Goal 1 (Week 1): STG =LTG due to ELOS   Skilled Therapeutic Interventions/Progress Updates:   Pt in recliner and agreeable to therapy, no c/o pain. Worked on functional balance and endurance, safety, and RW management w/ gait this session. Ambulated to/from therapy gym w/ close supervision using RW w/ verbal cues for safety and RW management. Pt requesting to practice stairs as this is something he is nervous about for going home. Ambulated 4 steps x2, each w/ a different side rail as pt was not sure which side his rail was on at home. Close supervision for safety. Worked on International aid/development worker w/ emphasis on RW management while turning w/ mod-max verbal and tactile cues to keep RW close to pt and directly in front of him. Min cues for sequencing of task of weaving through cones. Negotiated 12 cones x2 bouts. Worked on dynamic standing balance while performing bimanual UE tasks emphasizing maintaining balance w/o UE support during trunk rotation, reaching down and reaching up. Standing tasks also required scanning environment. Performed NuStep 5 min @ L4 x2 bouts for LE strengthening in reciprocal movement pattern. Min-mod verbal cues for attention to task, movement pattern, and balance muscle activation. Returned to room and ended session in recliner and in care of daughter, all needs met. Educated pt and daughter on CLOF and expected d/c at supervision level and encouraged daughter to participate in therapies this week as she will be caring for pt w/ pt's son. Discussed pt's stroke deficits including typical progression and long-term expectations. Both appreciative of education.   Therapy Documentation Precautions:  Precautions Precautions:  Fall Restrictions Weight Bearing Restrictions: No Vital Signs: Therapy Vitals Temp: 98.4 F (36.9 C) Temp Source: Oral Pulse Rate: (!) 59 Resp: 16 BP: 120/74 Patient Position (if appropriate): Lying Oxygen Therapy SpO2: 98 % O2 Device: Room Air  See Function Navigator for Current Functional Status.   Therapy/Group: Individual Therapy  Britteney Ayotte K Arnette 09/07/2017, 4:53 PM

## 2017-09-08 ENCOUNTER — Inpatient Hospital Stay (HOSPITAL_COMMUNITY): Payer: Medicare Other | Admitting: Occupational Therapy

## 2017-09-08 ENCOUNTER — Inpatient Hospital Stay (HOSPITAL_COMMUNITY): Payer: Medicare Other | Admitting: Physical Therapy

## 2017-09-08 ENCOUNTER — Inpatient Hospital Stay (HOSPITAL_COMMUNITY): Payer: Medicare Other | Admitting: Speech Pathology

## 2017-09-08 ENCOUNTER — Encounter (HOSPITAL_COMMUNITY): Payer: Medicare Other | Admitting: Psychology

## 2017-09-08 ENCOUNTER — Inpatient Hospital Stay (HOSPITAL_COMMUNITY): Payer: Medicare Other

## 2017-09-08 DIAGNOSIS — I69320 Aphasia following cerebral infarction: Secondary | ICD-10-CM

## 2017-09-08 DIAGNOSIS — N401 Enlarged prostate with lower urinary tract symptoms: Secondary | ICD-10-CM

## 2017-09-08 DIAGNOSIS — N319 Neuromuscular dysfunction of bladder, unspecified: Secondary | ICD-10-CM

## 2017-09-08 DIAGNOSIS — R350 Frequency of micturition: Secondary | ICD-10-CM

## 2017-09-08 DIAGNOSIS — I69359 Hemiplegia and hemiparesis following cerebral infarction affecting unspecified side: Secondary | ICD-10-CM

## 2017-09-08 LAB — GLUCOSE, CAPILLARY
GLUCOSE-CAPILLARY: 121 mg/dL — AB (ref 65–99)
Glucose-Capillary: 93 mg/dL (ref 65–99)
Glucose-Capillary: 103 mg/dL — ABNORMAL HIGH (ref 65–99)

## 2017-09-08 MED ORDER — DARIFENACIN HYDROBROMIDE ER 7.5 MG PO TB24
7.5000 mg | ORAL_TABLET | Freq: Every day | ORAL | Status: DC
  Administered 2017-09-08 – 2017-09-11 (×4): 7.5 mg via ORAL
  Filled 2017-09-08 (×5): qty 1

## 2017-09-08 NOTE — Progress Notes (Signed)
Speech Language Pathology Daily Session Note  Patient Details  Name: Eugene [name] MRN: [id number] Date of Birth: 10-17-1940  Today's Date: 09/08/2017 SLP Individual Time: 5852-7782 SLP Individual Time Calculation (min): 45 min  Short Term Goals: Week 1: SLP Short Term Goal 1 (Week 1): Pt will follow 2-step directions during functional tasks with Min A verbal cues.  SLP Short Term Goal 2 (Week 1): Pt will demonstrate word-finding at the phrase level with Min A multimodal cues.  SLP Short Term Goal 3 (Week 1): Pt will self-monitor and correct verbal errors in 50% opportunities with Min A verbal cues.   Skilled Therapeutic Interventions: Skilled treatment session focused on communication goals. Upon arrival, pt was supine in bed and requested to use the bathroom. SLP transferred pt to commode using RW, and pt successfully voided. SLP also facilitated session by providing Min A verbal cues for pt to follow 1-step directions and Max A verbal cues to follow 2-step directions during a basic money task. Pt able to self-monitor verbal errors in 90% opportunities throughout session, but required Min A verbal cues to correct them. Pt left upright in wheelchair with son present. Continue with current plan of care.      Function:  Cognition Comprehension Comprehension assist level: Understands basic 50 - 74% of the time/ requires cueing 25 - 49% of the time  Expression   Expression assist level: Expresses basic 50 - 74% of the time/requires cueing 25 - 49% of the time. Needs to repeat parts of sentences.  Social Interaction Social Interaction assist level: Interacts appropriately 75 - 89% of the time - Needs redirection for appropriate language or to initiate interaction.  Problem Solving Problem solving assist level: Solves basic 75 - 89% of the time/requires cueing 10 - 24% of the time  Memory Memory assist level: Recognizes or recalls 75 - 89% of the time/requires cueing 10 - 24% of the time     Pain Pain Assessment Pain Score: 0-No pain  Therapy/Group: Individual Therapy  Meredeth Ide  SLP - Student 09/08/2017, 11:17 AM

## 2017-09-08 NOTE — Patient Care Conference (Signed)
Inpatient RehabilitationTeam Conference and Plan of Care Update Date: 09/08/2017   Time: 11:10 AM    Patient Name: Eugene [name]      Medical Record Number: [id number]  Date of Birth: [birth year] Sex: Male         Room/Bed: 4W21C/4W21C-01 Payor Info: Payor: [place] / Plan: [employer] MEDICARE / Product Type: *No Product type* /    Admitting Diagnosis: CVA  Admit Date/Time:  08/31/2017  2:46 PM Admission Comments: No comment available   Primary Diagnosis:  <principal problem not specified> Principal Problem: <principal problem not specified>  Patient Active Problem List   Diagnosis Date Noted  . Neurogenic bladder   . 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 Prediabetes   . 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 Hypertensive emergency 08/31/2017  . Sundowning 08/31/2017  . Hx of completed stroke 08/31/2017  . Thalamic hemorrhage ([place])   . Right hemiparesis ([place])   . Chronic anticoagulation   . Dysmetria   . Benign essential HTN   . Hyperlipidemia   . OSA (obstructive sleep apnea)   . 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 ICH (intracerebral hemorrhage) ([facility]) - L thalamic d/t HTN in setting of Xarelto and plavix use 08/25/2017  . NSTEMI (non-ST elevated myocardial infarction) ([place])   . Diabetes mellitus type 2, noninsulin dependent ([place])   . Chest pain 06/24/2015  . Hyperglycemia 06/24/2015  . Pain in the chest   . Atrial fibrillation ([place]) 08/23/2009  . PLANTAR FASCIITIS, LEFT 12/31/2008  . TOBACCO ABUSE 04/10/2008  . REDNESS OR DISCHARGE OF EYE 03/09/2008  . RUQ PAIN 08/22/2007  . NEOPLASM, SKIN, UNCERTAIN BEHAVIOR 40/98/1191  . WEIGHT GAIN 03/17/2007  . SLEEP APNEA, OBSTRUCTIVE 09/22/2006  . MYOCARDIAL INFARCTION, HX OF 09/01/2006  . FATIGUE 09/01/2006  . HYPERLIPIDEMIA 06/22/2006  . ANXIETY 06/22/2006  . Depression 06/22/2006  . Hypertension 06/22/2006  . CAD (coronary artery  disease) 06/22/2006  . BPH (benign prostatic hyperplasia) 06/22/2006    Expected Discharge Date: Expected Discharge Date: 09/11/17  Team Members Present: Physician leading conference: Dr. Delice Lesch Social Worker Present: Ovidio Kin, LCSW Nurse Present: Isla Pence, RN PT Present: Michaelene Song, PT OT Present: Willeen Cass, OT SLP Present: Weston Anna, SLP PPS Coordinator present : Daiva Nakayama, RN, CRRN     Current Status/Progress Goal Weekly Team Focus  Medical   Right sided weakness, apraxia and aphasia secondary to left thalamic ICH on 4/10.  Improve mobility, mood, BP, BPH, prediabetes, neurogenic bladder  See above   Bowel/Bladder   cont bowel; incontinent of bladder at times; lbm 4/23  continent of bladder with min assist  timed toileting q2-3hr while awake   Swallow/Nutrition/ Hydration             ADL's   Min A/supervision overall with cues  Mod I/supervision  Pt/family education, safety awareness, modifed bathing/dressing,    Mobility   min-supervision overall using RW for gait >150'  supervision  safety awareness, discharge planning and caregiver education,    Communication   Min-Mod A  Min A  verbal expression, self-monitoring and correcting errors, follow 2 step commands    Safety/Cognition/ Behavioral Observations  Mod A  Min A  self-monitor and correct verbal errors    Pain   no c/o pain  <2 out of 10  assess q shift and prn   Skin   ecchymosis to abd  free  from infection/breakdown duirng rehab stay  assess q shift and prn      *See Care Plan and progress notes for long and short-term goals.     Barriers to Discharge  Current Status/Progress Possible Resolutions Date Resolved   Physician    Decreased caregiver support;Medical stability;Incontinence;Lack of/limited family support;Other (comments)  Cognition  See above  Therapies, follow up CT next week, optimize BP meds, meds for neurogenic bladder/BPH      Nursing                  PT         Pt's son has installed R handrail at steps            OT                  SLP                SW                Discharge Planning/Teaching Needs:  HOme with daughter and son taking turns providing supervision level. Both have been in for therapies. Neuro-psych to see today for coping      Team Discussion:  Progressing toward his goals of supervision level. Son is here for training today in anticipation of DC Sat. Poor carryover in therapies reason for supervision level and safety concerns. Started on meds for neurogenic bladder. Monitoring Diabetes. More difficulty with complex speech need to keep it simple. Family aware of need for 24 hr supervision at DC  Revisions to Treatment Plan:  DC 4/27    Continued Need for Acute Rehabilitation Level of Care: The patient requires daily medical management by a physician with specialized training in physical medicine and rehabilitation for the following conditions: Daily direction of a multidisciplinary physical rehabilitation program to ensure safe treatment while eliciting the highest outcome that is of practical value to the patient.: Yes Daily medical management of patient stability for increased activity during participation in an intensive rehabilitation regime.: Yes Daily analysis of laboratory values and/or radiology reports with any subsequent need for medication adjustment of medical intervention for : Neurological problems;Other;Renal problems;Blood pressure problems  Elease Hashimoto 09/08/2017, 2:31 PM

## 2017-09-08 NOTE — Progress Notes (Signed)
Physical Therapy Weekly Progress Note  Patient Details  Name: Eugene [name] MRN: [id number] Date of Birth: 03/21/1941  Beginning of progress report period: September 01, 2017 End of progress report period: September 08, 2017  Today's Date: 09/08/2017 PT Individual Time: 0903-1000 PT Individual Time Calculation (min): 57 min   Patient is progressing towards short term goals and long term goals.  Pt is min assist to supervision for all functional mobility using a RW, requires increased cueing for all functional mobility for safety secondary to cognitive deficits. Pt presents with impaired balance, requiring min to mod assist during any ambulation or dynamic balance tasks without and assistive device. Pt will require 24/7 supervision upon discharge and therapist has discussed this with pt's family.  Patient continues to demonstrate the following deficits muscle weakness, decreased coordination and decreased motor planning, decreased attention, decreased safety awareness and decreased memory and decreased standing balance and decreased balance strategies and therefore will continue to benefit from skilled PT intervention to increase functional independence with mobility.  Patient progressing toward long term goals..  Continue plan of care.  PT Short Term Goals Week 1:  PT Short Term Goal 1 (Week 1): STG =LTG due to ELOS  PT Short Term Goal 1 - Progress (Week 1): Progressing toward goal Week 2:  PT Short Term Goal 1 (Week 2): STG=LTG due to ELOS  Skilled Therapeutic Interventions/Progress Updates:  Ambulation/gait training;Balance/vestibular training;Cognitive remediation/compensation;Disease management/prevention;Discharge planning;Community reintegration;DME/adaptive equipment instruction;Functional electrical stimulation;Pain management;Neuromuscular re-education;Psychosocial support;Splinting/orthotics;Patient/family education;Functional mobility training;Skin care/wound management;Therapeutic  Exercise;Therapeutic Activities;Stair training;UE/LE Strength taining/ROM;UE/LE Coordination activities;Visual/perceptual remediation/compensation;Wheelchair propulsion/positioning   Pt seated in w/c upon PT arrival, agreeable to therapy tx and denies pain. Pt ambulated from room>gym x 150 ft with RW and supervision, verbal cues for attention to R side and safety awareness. Pt ambulated to the rehab apartment with RW and supervision, in the rehab apartment pt practices furniture transfers, min assist needed to perform sit<>stand from lower surface and increased cueing for RW management when ambulating around furniture. Pt ambulated into the kitchen with RW and occasional min assist during obstacle navigation, worked on cognitive task of finding items in kitchen such as pantry and then food items, pt requiring increased cueing to attend to R side. Pt worked on dynamic standing balance without UE support to performed forwards/backwards ambulation 2 x 15 ft each direction and to perform toe taps on 4 inch step, min to mod assist. Pt ascended/descended 4 steps with R handrail with min assist, verbal cues for step to pattern. Pts son present at end of session, therapist educated son on pts progress, goals and recommendations for home. Pt ambulated back to room with RW and supervision x 200 ft, verbal cues for awareness and RW management. Son educated on proper cueing to provide to pt during ambulation. Pt left seated in recliner at end of session with QRB in place, chair alarm set and son present.   Therapy Documentation Precautions:  Precautions Precautions: Fall Restrictions Weight Bearing Restrictions: No   See Function Navigator for Current Functional Status.  Therapy/Group: Individual Therapy  Netta Corrigan, PT, DPT 09/08/2017, 7:49 AM

## 2017-09-08 NOTE — Progress Notes (Signed)
Physical Therapy Session Note  Patient Details  Name: Eugene [name] MRN: [id number] Date of Birth: [birth year]  Today's Date: 09/08/2017 PT Individual Time: 1500-1530 PT Individual Time Calculation (min): 30 min   Short Term Goals: Week 2:  PT Short Term Goal 1 (Week 2): STG=LTG due to ELOS  Skilled Therapeutic Interventions/Progress Updates:    Pt supine in bed, agreeable to PT. Supine to sit with Supervision with use of bedrails. Sit to stand with Supervision to RW. Ambulation 2 x 150 ft with Supervision with RW, verbal cues to attend to R visual field to avoid obstacles and for safe RW usage. Pt appears indifferent to cues and continues to run into obstacles on R side. Ascend/descend 8 stairs x 2 with R handrail (ascending) and min assist, verbal cues for safe foot placement on stairs as pt shows poor awareness and safety on stairs. Environment scanning activity in therapy gym while ambulating with RW and searching for green cups, mod v/c to find cups and to retreive them safely. Pt left seated in recliner in room at end of session with needs in reach, son present, quick release belt and chair alarm in place.  Therapy Documentation Precautions:  Precautions Precautions: Fall Restrictions Weight Bearing Restrictions: No  See Function Navigator for Current Functional Status.   Therapy/Group: Individual Therapy  Excell Seltzer, PT, DPT  09/08/2017, 4:44 PM

## 2017-09-08 NOTE — Progress Notes (Signed)
Social Work Patient ID: Eugene [name], male   DOB: [birth year], 77 y.o.   MRN: [id number]  Met with pt and son who is here for family training to inform team conference progress toward his goals-supervision level. Aware target discharge is 4/27. Son wondered why he could stay longer and team discussed could not get to mod/i due to safety issues. Discussed home health and equipment , both agreeable to what team recommends. Pt's daughter to be here tomorrow to attend therapies with Dad.

## 2017-09-08 NOTE — Consult Note (Signed)
Neuropsychological Consultation   Patient:   Eugene [name]   DOB:   [birth year]  MR Number:  [id number]  Location:  [facility] [id number] [place] [address] Dept: [phone] [facility]: [phone]           Date of Service:   09/08/2017  Start Time:   1 PM End Time:   2 PM  Provider/Observer:  Ilean Skill, Psy.D.       Clinical Neuropsychologist       Billing Code/Service: [phone] 4 Units  Chief Complaint:    Eugene [name] is a 77 year old male with history of prediabetes, hypertension, hyperlipidemia, tobacco abuse and afib.  Presented on 08/25/2017 with right sided weakness and aphasia.  CT showed left thalamic hemorrhage with estimated blood volume of 75ml and regional mass-effect.  Patient has improved with motor function significantly.  Patient continues (improved) with expressive language deficits.  Paraphasic errors noted.  Confusion on MMSE included disorientated to time (2012 and situation).  Some of the errors noted may have been due to paraphasic errors.    Reason for Service:  Eugene [name] was referred for neuropsych consultation due to coping and adjustment issues following his CVA.  Below is the HPI for the current admission.  Eugene [name] a 77 y.o.right-handed malewith history ofprediabetes, hypertension, hyperlipidemia, tobacco abuse and atrial fibrillation maintained on Xarelto and question medical compliance. Per chart review patient lives alone independent and driving as well as active. One level home. Presented 08/25/2017 with right-sided weakness and aphasia. Blood pressure 157/98. CT the head showed oval left thalamic hemorrhage with an estimated blood volume of 8 mL. Mild surrounding edema and regional mass-effect. 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 Neurology follow-up currently plan is for aspirin 81 mg in 5-7 days of neuro stable post ICH with plans to repeat CT in 2-3 weeks. If ICH resolves plan was to add Eliquis to aspirin.Patient presently on subcutaneous heparin since 4/13/2019Echocardiogramwith ejection fraction of 60% no wall motion abnormalities.Physical andOccupational therapy evaluationscompleted with recommendations of physical medicine rehab consult.patient was admitted for a comprehensive rehab program  Current Status:  The patient clearly had expressive language deficits including paraphasic errors and word finding difficulties.  There was some stammering to his speech but it was not slurred.  The patient had deficits on MMSE with regard to time, thinking it was 2012.  He got month right after struggle.  President was wrong.  Mild impairments with memory but may be due to expressive language deficits.  Behavioral Observation: Eugene [name]  presents as a 77 y.o.-year-old Right Caucasian Male who appeared his stated age. his dress was Appropriate and he was Well Groomed and his manners were Appropriate to the situation.  his participation was indicative of Appropriate behaviors.  There were not any physical disabilities noted.  he displayed an appropriate level of cooperation and motivation.     Interactions:    Active Appropriate  Attention:   abnormal and attention span appeared shorter than expected for age  Memory:   abnormal; remote memory intact, recent memory impaired  Visuo-spatial:  not examined  Speech (Volume):  low  Speech:   non-fluent aphasia; Paraphasic errors and circumlocutions.    Thought Process:  Coherent and Relevant  Though  Content:  WNL; not suicidal and not homicidal  Orientation:   person and place  Judgment:   Fair  Planning:   Poor  Affect:    Appropriate  Mood:    Dysphoric  Insight:   Fair  Intelligence:   normal  Medical History:   Past Medical History:  Diagnosis Date  . Anxiety   . Atrial fibrillation ([place])   . BPH (benign prostatic hypertrophy)   . Cataracts, both eyes   . Coronary artery disease   . Depressed   . Hyperlipidemia   . Hypertension   . ICH (intracerebral hemorrhage) ([place]) 08/2017  . MI (myocardial infarction) ([place])   . OSA (obstructive sleep apnea)   . Pre-diabetes     Psychiatric History:  Patient does have prior history of anxiety and depression.  Reports that current status has not exacerbated these symptoms and he does not feel overly anxious or depressed.  Family Med/Psych History:  Family History  Problem Relation Age of Onset  . Sleep apnea Son     Risk of Suicide/Violence: low Patient denies SI or HI.  Impression/DX:  Eugene [name] is a 77 year old male with history of prediabetes, hypertension, hyperlipidemia, tobacco abuse and afib.  Presented on 08/25/2017 with right sided weakness and aphasia.  CT showed left thalamic hemorrhage with estimated blood volume of 24ml and regional mass-effect.  Patient has improved with motor function significantly.  Patient continues (improved) with expressive language deficits.  Paraphasic errors noted.  Confusion on MMSE included disorientated to time (2012 and situation).  Some of the errors noted may have been due to paraphasic errors.    The patient clearly had expressive language deficits including paraphasic errors and word finding difficulties.  There was some stammering to his speech but it was not slurred.  The patient had deficits on MMSE with regard to time, thinking it was 2012.  He got month right after struggle.  President was wrong.  Mild impairments with memory but may be due to expressive language  deficits.   Diagnosis:    Nontraumatic subcortical hemorrhage of left cerebral hemisphere [facility]) - Plan: Ambulatory referral to Physical Medicine Rehab         Electronically Signed   _______________________ Ilean Skill, Psy.D.

## 2017-09-08 NOTE — Progress Notes (Addendum)
Subjective/Complaints: Patient seen lying in bed this morning.  No reported issues overnight.  ROS: Unreliable due to cognition  Objective: Vital Signs: Blood pressure 101/63, pulse 70, temperature 98 F (36.7 C), temperature source Oral, resp. rate 18, height 6' (1.829 m), weight 85.1 kg (187 lb 9.8 oz), SpO2 97 %. No results found. Results for orders placed or performed during the hospital encounter of 08/31/17 (from the past 72 hour(s))  Glucose, capillary     Status: None   Collection Time: 09/05/17 11:51 AM  Result Value Ref Range   Glucose-Capillary 99 65 - 99 mg/dL  Glucose, capillary     Status: Abnormal   Collection Time: 09/05/17  4:35 PM  Result Value Ref Range   Glucose-Capillary 107 (H) 65 - 99 mg/dL  Glucose, capillary     Status: Abnormal   Collection Time: 09/05/17  9:26 PM  Result Value Ref Range   Glucose-Capillary 131 (H) 65 - 99 mg/dL  Glucose, capillary     Status: None   Collection Time: 09/06/17  6:37 AM  Result Value Ref Range   Glucose-Capillary 96 65 - 99 mg/dL   Comment 1 Notify RN   Glucose, capillary     Status: None   Collection Time: 09/06/17 12:00 PM  Result Value Ref Range   Glucose-Capillary 97 65 - 99 mg/dL  Glucose, capillary     Status: Abnormal   Collection Time: 09/06/17  4:38 PM  Result Value Ref Range   Glucose-Capillary 112 (H) 65 - 99 mg/dL  Glucose, capillary     Status: Abnormal   Collection Time: 09/07/17  6:30 AM  Result Value Ref Range   Glucose-Capillary 125 (H) 65 - 99 mg/dL  Glucose, capillary     Status: Abnormal   Collection Time: 09/07/17 11:47 AM  Result Value Ref Range   Glucose-Capillary 104 (H) 65 - 99 mg/dL  Glucose, capillary     Status: None   Collection Time: 09/07/17  4:42 PM  Result Value Ref Range   Glucose-Capillary 84 65 - 99 mg/dL  Glucose, capillary     Status: Abnormal   Collection Time: 09/08/17  6:15 AM  Result Value Ref Range   Glucose-Capillary 121 (H) 65 - 99 mg/dL    Gen NAD. Vital signs  reviewed. HENT: poor dentition. Normocephalic, atraumatic.  Eyes: EOMI. No discharge. Cardio: Irregularly irregular Resp: CTA B/L and unla unlabored bored GI: BS positive and ND Musc/Skel:  No edema, no tenderness Neuro: Alert and oriented x1. Motor: 4+/5 R delt , bi,tri, grip, HF, KE ,ADF (unchanged) 5/5 LUE/LLE Dysarthria  Aphasia Skin:   Intact. Warm and Dry.  Assessment/Plan: 1. 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 Physiatrist is providing close team supervision and 24 hour management of active medical problems listed below. Physiatrist and rehab team continue to assess barriers to discharge/monitor patient progress toward functional and medical goals. FIM: Function - Bathing Position: Shower Body parts bathed by patient: Right arm, Left arm, Chest, Abdomen, Front perineal area, Right upper leg, Left upper leg, Buttocks Body parts bathed by helper: Left lower leg, Right lower leg Assist Level: Touching or steadying assistance(Pt > 75%)  Function- Upper Body Dressing/Undressing What is the patient wearing?: Pull over shirt/dress Pull over shirt/dress - Perfomed by patient: Thread/unthread right sleeve, Thread/unthread left sleeve, Put head through opening, Pull shirt over trunk Pull over shirt/dress - Perfomed by helper: Pull shirt over trunk  Assist Level: Supervision or verbal cues Function - Lower Body Dressing/Undressing What is the patient wearing?: Pants, Non-skid slipper socks Position: Other (comment)(commode) Pants- Performed by patient: Pull pants up/down, Thread/unthread left pants leg, Thread/unthread right pants leg Pants- Performed by helper: Pull pants up/down, Fasten/unfasten pants Non-skid slipper socks- Performed by patient: Don/doff left sock, Don/doff right sock Assist for footwear: Supervision/touching assist Assist for lower body dressing:  Touching or steadying assistance (Pt > 75%)  Function - Toileting Toileting steps completed by patient: Adjust clothing prior to toileting, Performs perineal hygiene, Adjust clothing after toileting Toileting steps completed by helper: Performs perineal hygiene Toileting Assistive Devices: Grab bar or rail Assist level: Touching or steadying assistance (Pt.75%)  Function - Air cabin crew transfer assistive device: Grab bar Assist level to toilet: Touching or steadying assistance (Pt > 75%) Assist level from toilet: Touching or steadying assistance (Pt > 75%)  Function - Chair/bed transfer Chair/bed transfer method: Stand pivot, Ambulatory Chair/bed transfer assist level: Supervision or verbal cues Chair/bed transfer assistive device: Armrests, Walker Chair/bed transfer details: Verbal cues for technique, Verbal cues for precautions/safety  Function - Locomotion: Wheelchair Type: Manual Max wheelchair distance: 139ft  Assist Level: Supervision or verbal cues Assist Level: Supervision or verbal cues Wheel 150 feet activity did not occur: Safety/medical concerns Turns around,maneuvers to table,bed, and toilet,negotiates 3% grade,maneuvers on rugs and over doorsills: No Function - Locomotion: Ambulation Assistive device: Walker-rolling Max distance: 150 ft Assist level: Supervision or verbal cues Assist level: Supervision or verbal cues Assist level: Supervision or verbal cues Walk 150 feet activity did not occur: Safety/medical concerns Assist level: Supervision or verbal cues Walk 10 feet on uneven surfaces activity did not occur: Safety/medical concerns  Function - Comprehension Comprehension: Auditory Comprehension assist level: Understands basic 50 - 74% of the time/ requires cueing 25 - 49% of the time  Function - Expression Expression: Verbal Expression assist level: Expresses basic 50 - 74% of the time/requires cueing 25 - 49% of the time. Needs to repeat parts  of sentences.  Function - Social Interaction Social Interaction assist level: Interacts appropriately 75 - 89% of the time - Needs redirection for appropriate language or to initiate interaction.  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   Plan follow-up CT 2-3 weeks if ICH resolves add Eliquis to aspirin secondary to history of atrial fibrillation  Cont CIR  2. DVT Prophylaxis/Anticoagulation: Subcutaneous heparin initiated 08/28/2017 3. Pain Management:Tylenol as needed 4. Mood:Paxil 20 mg daily, Seroquel 25 mg nightly Xanax 0.5 mg 3 times daily as needed (home meds) 5. Neuropsych: This patientisnot fully capable of making decisions on hisown behalf. 6. Skin/Wound Care:Routine skin checks 7. Fluids/Electrolytes/Nutrition:Routine I&O's   BMP within acceptable range on 4/17   Labs ordered for tomorrow 8.Atrial fibrillation. Aspirin to be initiated 81 mg 09/01/2017. Cardiac rate controlled 9.Hypertension. Norvasc 10 mg daily, lisinopril 20 mg daily, Lopressor 12.5 mg twice daily  HCTZ started on 4/21 Vitals:   09/07/17 1333 09/08/17 0043  BP: 120/74 101/63  Pulse: (!) 59 70  Resp: 16 18  Temp: 98.4 F (36.9 C) 98 F (36.7 C)  SpO2: 98% 97%   Relatively controlled on 4/24 10.Prediabetic. SSI.   CBGs relatively controlled on 4/24 11.BPH. Proscar 5 mg daily.    PVRs with mild retention with urinary frequency as  Enablex started on 4/24 12.Hyperlipidemia. Crestor   LOS (Days) 8 A FACE TO FACE EVALUATION WAS  PERFORMED  Ankit Lorie Phenix 09/08/2017, 9:33 AM

## 2017-09-08 NOTE — Progress Notes (Signed)
Occupational Therapy Session Note  Patient Details  Name: Eugene [name] MRN: [id number] Date of Birth: [birth year]  Today's Date: 09/08/2017 OT Individual Time: 1100-1200 OT Individual Time Calculation (min): 60 min    Short Term Goals: Week 2:  OT Short Term Goal 1 (Week 2): STG-LTG 2/2 ELOS  Skilled Therapeutic Interventions/Progress Updates:    Treatment session focused on ADLs/self care training, transfer training, safety awareness, energy conservation, and pt education. Pt refused shower today but agreed to sink bathing. Pt completed sit<>stand transfers with CGA for impulsivity and poor safety awareness. therapist instructed pt on safe walker use throughout session. Pt completed UB/LB washing and grooming standing at sink with CGA with rest breaks in between. Pt tolerated up to 5 minutes of standing with 1 UE supported with F+ balance throughout tasks. Pt completed toilet transfer with CGA with RW and v/c for safety. Therapist instructed pt and son on safe tub transfers and performed with CGA. Recommended grab bars and shower bench for home. Pt noted resistant to recommendations but son agrees on support. Pt returned to room via w/c with son present and needs met.   Therapy Documentation Precautions:  Precautions Precautions: Fall Restrictions Weight Bearing Restrictions: No Vital Signs: Therapy Vitals Temp: 98.5 F (36.9 C) Temp Source: Oral Pulse Rate: 80 Resp: 18 BP: 96/68 Patient Position (if appropriate): Lying Oxygen Therapy SpO2: 95 % O2 Device: Room Air   ADL: ADL ADL Comments: Please see functional navigator   See Function Navigator for Current Functional Status.   Therapy/Group: Individual Therapy  Delon Sacramento 09/08/2017, 4:20 PM

## 2017-09-09 ENCOUNTER — Inpatient Hospital Stay (HOSPITAL_COMMUNITY): Payer: Medicare Other | Admitting: Occupational Therapy

## 2017-09-09 ENCOUNTER — Inpatient Hospital Stay (HOSPITAL_COMMUNITY): Payer: Medicare Other | Admitting: Speech Pathology

## 2017-09-09 ENCOUNTER — Inpatient Hospital Stay (HOSPITAL_COMMUNITY): Payer: Medicare Other | Admitting: Physical Therapy

## 2017-09-09 LAB — GLUCOSE, CAPILLARY
GLUCOSE-CAPILLARY: 99 mg/dL (ref 65–99)
Glucose-Capillary: 97 mg/dL (ref 65–99)
Glucose-Capillary: 120 mg/dL — ABNORMAL HIGH (ref 65–99)

## 2017-09-09 NOTE — Progress Notes (Signed)
Speech Language Pathology Daily Session Notes  Patient Details  Name: Eugene [name] MRN: [id number] Date of Birth: 06/11/1940  Today's Date: 09/09/2017  Session 1: SLP Individual Time: 1030-1100 SLP Individual Time Calculation (min): 30 min   Session 1: SLP Individual Time: 1345-1430 SLP Individual Time Calculation (min): 45 min  Short Term Goals: Week 1: SLP Short Term Goal 1 (Week 1): Pt will follow 2-step directions during functional tasks with Min A verbal cues.  SLP Short Term Goal 2 (Week 1): Pt will demonstrate word-finding at the phrase level with Min A multimodal cues.  SLP Short Term Goal 3 (Week 1): Pt will self-monitor and correct verbal errors in 50% opportunities with Min A verbal cues.   Skilled Therapeutic Interventions:  Session 1: Skilled treatment session focused on patient and family education. SLP facilitated session by providing education the patient and his daughter in regards to current verbal expression deficits and strategies to utilize at home to maximize word-finding. Patient and daughter verbalized understanding. Patient named functional items with Min A semantic cues and compared/contrasted two items at the phrase and sentence level with Min A question cues. Patient left supine in bed with daughter present. Continue with current plan of care.   Session 2: Skilled treatment session focused on commutation goals and completion of family education. Patient's daughter present and educated in regards to patient's current receptive language deficits and how to maximize auditory comprehension. She verbalized understanding and handouts were also given to reinforce information. Patient followed 1 and 2 step mildly abstract auditory directions with Mod-Max A verbal cues with repetition and extra time. Patient left upright in recliner with all needs within reach and daughter present. Continue with current plan of care.   Function:   Cognition Comprehension  Comprehension assist level: Understands basic 75 - 89% of the time/ requires cueing 10 - 24% of the time  Expression   Expression assist level: Expresses basic 90% of the time/requires cueing < 10% of the time.  Social Interaction Social Interaction assist level: Interacts appropriately 90% of the time - Needs monitoring or encouragement for participation or interaction.  Problem Solving Problem solving assist level: Solves basic 75 - 89% of the time/requires cueing 10 - 24% of the time  Memory Memory assist level: Recognizes or recalls 75 - 89% of the time/requires cueing 10 - 24% of the time    Pain No/Denies Pain   Therapy/Group: Individual Therapy  [name] 09/09/2017, 3:28 PM

## 2017-09-09 NOTE — Progress Notes (Signed)
Subjective/Complaints: Pt seen lying in bed this AM.  He states he slept well overnight.  No reported issues overnight.   ROS: Unreliable due to cognition  Objective: Vital Signs: Blood pressure 124/69, pulse (!) 53, temperature 97.9 F (36.6 C), temperature source Oral, resp. rate 18, height 6' (1.829 m), weight 84.6 kg (186 lb 8.2 oz), SpO2 98 %. No results found. Results for orders placed or performed during the hospital encounter of 08/31/17 (from the past 72 hour(s))  Glucose, capillary     Status: None   Collection Time: 09/06/17 12:00 PM  Result Value Ref Range   Glucose-Capillary 97 65 - 99 mg/dL  Glucose, capillary     Status: Abnormal   Collection Time: 09/06/17  4:38 PM  Result Value Ref Range   Glucose-Capillary 112 (H) 65 - 99 mg/dL  Glucose, capillary     Status: Abnormal   Collection Time: 09/07/17  6:30 AM  Result Value Ref Range   Glucose-Capillary 125 (H) 65 - 99 mg/dL  Glucose, capillary     Status: Abnormal   Collection Time: 09/07/17 11:47 AM  Result Value Ref Range   Glucose-Capillary 104 (H) 65 - 99 mg/dL  Glucose, capillary     Status: None   Collection Time: 09/07/17  4:42 PM  Result Value Ref Range   Glucose-Capillary 84 65 - 99 mg/dL  Glucose, capillary     Status: Abnormal   Collection Time: 09/08/17  6:15 AM  Result Value Ref Range   Glucose-Capillary 121 (H) 65 - 99 mg/dL  Glucose, capillary     Status: None   Collection Time: 09/08/17 11:20 AM  Result Value Ref Range   Glucose-Capillary 93 65 - 99 mg/dL  Glucose, capillary     Status: Abnormal   Collection Time: 09/08/17  5:19 PM  Result Value Ref Range   Glucose-Capillary 103 (H) 65 - 99 mg/dL  Glucose, capillary     Status: None   Collection Time: 09/09/17  6:36 AM  Result Value Ref Range   Glucose-Capillary 97 65 - 99 mg/dL    Gen NAD. Vital signs reviewed. HENT: poor dentition. Normocephalic, atraumatic.  Eyes: EOMI. No discharge. Cardio: Irregularly irregular Resp: CTA B/L and  unla unlabored GI: BS positive and ND Musc/Skel:  No edema, no tenderness Neuro: Alert and oriented x1. Motor: 4+/5 R delt , bi,tri, grip, HF, KE ,ADF (stable) 5/5 LUE/LLE Aphasia Skin:   Intact. Warm and Dry.  Assessment/Plan: 1. 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 Physiatrist is providing close team supervision and 24 hour management of active medical problems listed below. Physiatrist and rehab team continue to assess barriers to discharge/monitor patient progress toward functional and medical goals. FIM: Function - Bathing Position: Standing at sink Body parts bathed by patient: Right arm, Left arm, Chest, Abdomen, Front perineal area, Right upper leg, Left upper leg, Buttocks Body parts bathed by helper: Left lower leg, Right lower leg Assist Level: Touching or steadying assistance(Pt > 75%)  Function- Upper Body Dressing/Undressing What is the patient wearing?: Pull over shirt/dress Pull over shirt/dress - Perfomed by patient: Thread/unthread right sleeve, Thread/unthread left sleeve, Put head through opening, Pull shirt over trunk Pull over shirt/dress - Perfomed by helper: Pull shirt over trunk Assist Level: Supervision or verbal cues Function - Lower Body Dressing/Undressing Lower body dressing/undressing activity did not occur: Refused What is the patient wearing?: Pants, Non-skid slipper socks Position: Other (  comment)(commode) Pants- Performed by patient: Pull pants up/down, Thread/unthread left pants leg, Thread/unthread right pants leg Pants- Performed by helper: Pull pants up/down, Fasten/unfasten pants Non-skid slipper socks- Performed by patient: Don/doff left sock, Don/doff right sock Assist for footwear: Supervision/touching assist Assist for lower body dressing: Touching or steadying assistance (Pt > 75%)  Function - Toileting Toileting steps  completed by patient: Adjust clothing prior to toileting, Performs perineal hygiene, Adjust clothing after toileting Toileting steps completed by helper: Performs perineal hygiene Toileting Assistive Devices: Grab bar or rail Assist level: Touching or steadying assistance (Pt.75%)  Function - Air cabin crew transfer assistive device: Grab bar Assist level to toilet: Touching or steadying assistance (Pt > 75%) Assist level from toilet: Touching or steadying assistance (Pt > 75%)  Function - Chair/bed transfer Chair/bed transfer method: Ambulatory, Stand pivot Chair/bed transfer assist level: Supervision or verbal cues Chair/bed transfer assistive device: Armrests, Walker Chair/bed transfer details: Verbal cues for technique, Verbal cues for precautions/safety, Verbal cues for safe use of DME/AE  Function - Locomotion: Wheelchair Type: Manual Max wheelchair distance: 187ft  Assist Level: Supervision or verbal cues Assist Level: Supervision or verbal cues Wheel 150 feet activity did not occur: Safety/medical concerns Turns around,maneuvers to table,bed, and toilet,negotiates 3% grade,maneuvers on rugs and over doorsills: No Function - Locomotion: Ambulation Assistive device: Walker-rolling Max distance: 150' Assist level: Supervision or verbal cues Assist level: Supervision or verbal cues Assist level: Supervision or verbal cues Walk 150 feet activity did not occur: Safety/medical concerns Assist level: Supervision or verbal cues Walk 10 feet on uneven surfaces activity did not occur: Safety/medical concerns  Function - Comprehension Comprehension: Auditory Comprehension assist level: Understands basic 50 - 74% of the time/ requires cueing 25 - 49% of the time  Function - Expression Expression: Verbal Expression assist level: Expresses basic 50 - 74% of the time/requires cueing 25 - 49% of the time. Needs to repeat parts of sentences.  Function - Social  Interaction Social Interaction assist level: Interacts appropriately 75 - 89% of the time - Needs redirection for appropriate language or to initiate interaction.  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   Plan follow-up CT 2-3 weeks if ICH resolves add Eliquis to aspirin secondary to history of atrial fibrillation  Cont CIR  2. DVT Prophylaxis/Anticoagulation: Subcutaneous heparin initiated 08/28/2017 3. Pain Management:Tylenol as needed 4. Mood:Paxil 20 mg daily, Seroquel 25 mg nightly Xanax 0.5 mg 3 times daily as needed (home meds) 5. Neuropsych: This patientisnot fully capable of making decisions on hisown behalf. 6. Skin/Wound Care:Routine skin checks 7. Fluids/Electrolytes/Nutrition:Routine I&O's   BMP within acceptable range on 4/17   Labs ordered for tomorrow 8.Atrial fibrillation. Aspirin to be initiated 81 mg 09/01/2017. Cardiac rate controlled 9.Hypertension. Norvasc 10 mg daily, lisinopril 20 mg daily, Lopressor 12.5 mg twice daily  HCTZ started on 4/21 Vitals:   09/08/17 1459 09/09/17 0127  BP:  124/69  Pulse: 80 (!) 53  Resp:  18  Temp:  97.9 F (36.6 C)  SpO2:  98%  Relatively controlled on 4/25 10.Prediabetic. SSI.   CBGs relatively controlled on 4/25 11.BPH. Proscar 5 mg daily.    PVRs with mild retention with urinary frequency as  Enablex started on 4/24 12.Hyperlipidemia. Crestor   LOS (Days) 9 A FACE TO FACE EVALUATION WAS PERFORMED  Eugene [name] 09/09/2017, 9:58 AM

## 2017-09-09 NOTE — Progress Notes (Signed)
Social Work Patient ID: Eugene [name], male   DOB: Oct 24, 1940, 77 y.o.   MRN: [id number]  Both son and daughter here to go through education for pt today and feel ready for discharge on Sat. Have ordered equipment and follow up therapies. Children aware pt will need 24 hr supervision at discharge.

## 2017-09-09 NOTE — Progress Notes (Signed)
Physical Therapy Session Note  Patient Details  Name: Eugene [name] MRN: [id number] Date of Birth: [birth year]  Today's Date: 09/09/2017 PT Individual Time: 0800-0855 PT Individual Time Calculation (min): 55 min   Short Term Goals: Week 2:  PT Short Term Goal 1 (Week 2): STG=LTG due to ELOS  Skilled Therapeutic Interventions/Progress Updates:   Pt in supine and agreeable to therapy, denies pain. Provided set-up assist to don shirt and pants, pt was in gown. Verbal cues for technique and sequencing, pt first attempted to put shirt on over gown. Worked on functional mobility and functional balance this session w/ both son and daughter present. Ambulated around unit w/ supervision using RW, discussed cues pt often needs for safety w/ RW management. Both verbalized understanding and witnessed multiple episodes where pt did need cues. Practiced negotiating 4 steps w/ R handrail and close supervision. Educated son and daughter on providing close supervision for all in-home mobility at first as pt re-learns the home environment, both verbalized understanding. Performed Berg Balance Scale as detailed below, scored 37/56 and explained significance of results to both pt and children. Performed dynamic gait w/o RW, requiring pt to scan environment and find bean bags that were either on floor or up high. Ambulated back to room w/o AD and asked pt to use environmental cues to find room if given his room number, min cues for way-finding. Min assist overall for balance when ambulating w/o AD. Returned to room and ended session in recliner and in care of children, all needs met.   Therapy Documentation Precautions:  Precautions Precautions: Fall Restrictions Weight Bearing Restrictions: No Balance: Standardized Balance Assessment Standardized Balance Assessment: Berg Balance Test Berg Balance Test Sit to Stand: Able to stand without using hands and stabilize independently Standing Unsupported: Able to  stand 2 minutes with supervision Sitting with Back Unsupported but Feet Supported on Floor or Stool: Able to sit safely and securely 2 minutes Stand to Sit: Sits safely with minimal use of hands Transfers: Able to transfer safely, minor use of hands Standing Unsupported with Eyes Closed: Able to stand 10 seconds with supervision Standing Ubsupported with Feet Together: Needs help to attain position but able to stand for 30 seconds with feet together From Standing, Reach Forward with Outstretched Arm: Can reach forward >12 cm safely (5") From Standing Position, Pick up Object from Floor: Able to pick up shoe, needs supervision From Standing Position, Turn to Look Behind Over each Shoulder: Needs supervision when turning Turn 360 Degrees: Able to turn 360 degrees safely but slowly Standing Unsupported, Alternately Place Feet on Step/Stool: Able to stand independently and complete 8 steps >20 seconds Standing Unsupported, One Foot in Front: Able to take small step independently and hold 30 seconds Standing on One Leg: Unable to try or needs assist to prevent fall Total Score: 37  See Function Navigator for Current Functional Status.   Therapy/Group: Individual Therapy  Keileigh Vahey K Arnette 09/09/2017, 8:59 AM

## 2017-09-09 NOTE — Progress Notes (Signed)
Occupational Therapy Session Note  Patient Details  Name: Eugene [name] MRN: [id number] Date of Birth: 1940-07-09  Today's Date: 09/09/2017 OT Individual Time: 1505-1530 OT Individual Time Calculation (min): 25 min   Short Term Goals: Week 1:  OT Short Term Goal 1 (Week 1): Pt will complete LB dressing with supervision OT Short Term Goal 1 - Progress (Week 1): Met OT Short Term Goal 2 (Week 1): Pt will tolerate standing at the sink in preparation for BADL tasks for 3 mins OT Short Term Goal 2 - Progress (Week 1): Met OT Short Term Goal 3 (Week 1): Pt will integrate R UE within bathing tasks with min cues 75% of the time OT Short Term Goal 3 - Progress (Week 1): Met  Skilled Therapeutic Interventions/Progress Updates:    Pt greeted handoff from first OT session with son and daughter present. Reviewed home bathroom set-up with family and set-up bathroom to simulate home environment. Reviewed how to provide pt with 1-step cues at he is unable to folllow 2 step commands. Family able to appropriately cue pt on getting in and out of tub shower using tub bench. Addressed dynamic balance with horse shoe toss with pt needing min A for balance without AD. Ambulated short distance without AD to pick up horse shoes with min A and verbal cues for reaching not too far out of base of support. Pt ambulated back to room in similar fashion and left seated In recliner with safety belt, chair alarm, and family present.   Therapy Documentation Precautions:  Precautions Precautions: Fall Restrictions Weight Bearing Restrictions: No Pain:  none/denies pain ADL: ADL ADL Comments: Please see functional navigator  See Function Navigator for Current Functional Status.   Therapy/Group: Individual Therapy  Valma Cava 09/09/2017, 3:27 PM

## 2017-09-09 NOTE — Progress Notes (Signed)
Occupational Therapy Session Note  Patient Details  Name: DEZMON CONOVER MRN: [id number] Date of Birth: 1940/07/10  Today's Date: 09/09/2017 OT Individual Time: 1430-1500 OT Individual Time Calculation (min): 30 min    Skilled Therapeutic Interventions/Progress Updates:    1:1 Focus on functional ambulation with min guard with RW; with A to keep RW close to her. Pt ambulated from room to [facility] gym and performed activity on [employer] to challenge balance, visual scanning, activity tolerance and sustained attention to task. Pt with slower reaction time and require increase time with scanning to right. Ambulated to apartment and transferred onto couch. Hand off to next therapist.  Therapy Documentation Precautions:  Precautions Precautions: Fall Restrictions Weight Bearing Restrictions: No Pain:  no c/o pain  ADL: ADL ADL Comments: Please see functional navigator  See Function Navigator for Current Functional Status.   Therapy/Group: Individual Therapy  Willeen Cass [facility] 09/09/2017, 3:40 PM

## 2017-09-10 ENCOUNTER — Inpatient Hospital Stay (HOSPITAL_COMMUNITY): Payer: Medicare Other | Admitting: Physical Therapy

## 2017-09-10 ENCOUNTER — Other Ambulatory Visit: Payer: Self-pay | Admitting: Physical Medicine and Rehabilitation

## 2017-09-10 ENCOUNTER — Inpatient Hospital Stay (HOSPITAL_COMMUNITY): Payer: Medicare Other

## 2017-09-10 ENCOUNTER — Inpatient Hospital Stay (HOSPITAL_COMMUNITY): Payer: Medicare Other | Admitting: Speech Pathology

## 2017-09-10 ENCOUNTER — Inpatient Hospital Stay (HOSPITAL_COMMUNITY): Payer: Medicare Other | Admitting: Occupational Therapy

## 2017-09-10 DIAGNOSIS — N182 Chronic kidney disease, stage 2 (mild): Secondary | ICD-10-CM

## 2017-09-10 DIAGNOSIS — N179 Acute kidney failure, unspecified: Secondary | ICD-10-CM

## 2017-09-10 LAB — BASIC METABOLIC PANEL
ANION GAP: 12 (ref 5–15)
BUN: 28 mg/dL — ABNORMAL HIGH (ref 6–20)
CALCIUM: 9.4 mg/dL (ref 8.9–10.3)
CO2: 25 mmol/L (ref 22–32)
Chloride: 99 mmol/L — ABNORMAL LOW (ref 101–111)
Creatinine, Ser: 1.58 mg/dL — ABNORMAL HIGH (ref 0.61–1.24)
GFR, EST AFRICAN AMERICAN: 47 mL/min — AB (ref >60)
GFR, EST NON AFRICAN AMERICAN: 40 mL/min — AB (ref >60)
GLUCOSE: 117 mg/dL — AB (ref 65–99)
POTASSIUM: 4.4 mmol/L (ref 3.5–5.1)
Sodium: 136 mmol/L (ref 135–145)

## 2017-09-10 LAB — GLUCOSE, CAPILLARY
Glucose-Capillary: 116 mg/dL — ABNORMAL HIGH (ref 65–99)
Glucose-Capillary: 101 mg/dL — ABNORMAL HIGH (ref 65–99)
Glucose-Capillary: 116 mg/dL — ABNORMAL HIGH (ref 65–99)

## 2017-09-10 MED ORDER — APIXABAN 5 MG PO TABS: 5.0000 mg | ORAL_TABLET | Freq: Two times a day (BID) | ORAL | 0 refills | Status: DC

## 2017-09-10 MED ORDER — LISINOPRIL 20 MG PO TABS: 20.0000 mg | ORAL_TABLET | Freq: Every day | ORAL | 0 refills | Status: DC

## 2017-09-10 MED ORDER — PAROXETINE HCL 20 MG PO TABS: 20.0000 mg | ORAL_TABLET | Freq: Every day | ORAL | 0 refills | Status: DC

## 2017-09-10 MED ORDER — AMLODIPINE BESYLATE 10 MG PO TABS: 10.0000 mg | ORAL_TABLET | Freq: Every day | ORAL | 0 refills | Status: AC

## 2017-09-10 MED ORDER — PANTOPRAZOLE SODIUM 40 MG PO TBEC: 40.0000 mg | DELAYED_RELEASE_TABLET | Freq: Every day | ORAL | 0 refills | Status: DC

## 2017-09-10 MED ORDER — APIXABAN 5 MG PO TABS
5.0000 mg | ORAL_TABLET | Freq: Two times a day (BID) | ORAL | Status: DC
  Administered 2017-09-10 – 2017-09-11 (×2): 5 mg via ORAL
  Filled 2017-09-10 (×2): qty 1

## 2017-09-10 MED ORDER — DARIFENACIN HYDROBROMIDE ER 7.5 MG PO TB24: 7.5000 mg | ORAL_TABLET | Freq: Every day | ORAL | 0 refills | Status: DC

## 2017-09-10 MED ORDER — HYDROCHLOROTHIAZIDE 12.5 MG PO CAPS: 12.5000 mg | ORAL_CAPSULE | Freq: Every day | ORAL | 0 refills | Status: DC

## 2017-09-10 MED ORDER — ROSUVASTATIN CALCIUM 20 MG PO TABS: 20.0000 mg | ORAL_TABLET | Freq: Every day | ORAL | 0 refills | Status: DC

## 2017-09-10 MED ORDER — SENNOSIDES-DOCUSATE SODIUM 8.6-50 MG PO TABS: 1.0000 | ORAL_TABLET | Freq: Two times a day (BID) | ORAL | 0 refills | Status: DC

## 2017-09-10 MED ORDER — QUETIAPINE FUMARATE 25 MG PO TABS: 25.0000 mg | ORAL_TABLET | Freq: Every day | ORAL | 0 refills | Status: DC

## 2017-09-10 MED ORDER — FINASTERIDE 5 MG PO TABS: ORAL_TABLET | ORAL | 0 refills | Status: DC

## 2017-09-10 NOTE — Discharge Summary (Signed)
Discharge summary job # [id number]

## 2017-09-10 NOTE — Progress Notes (Signed)
Subjective/Complaints: Patient seen lying in bed this morning. He states he slept well overnight. No reported issues overnight.  ROS: unreliable due to cognition  Objective: Vital Signs: Blood pressure 124/84, pulse (!) 56, temperature 98.5 F (36.9 C), temperature source Oral, resp. rate 18, height 6' (1.829 m), weight 84.6 kg (186 lb 8.2 oz), SpO2 97 %. No results found. Results for orders placed or performed during the hospital encounter of 08/31/17 (from the past 72 hour(s))  Glucose, capillary     Status: Abnormal   Collection Time: 09/07/17 11:47 AM  Result Value Ref Range   Glucose-Capillary 104 (H) 65 - 99 mg/dL  Glucose, capillary     Status: None   Collection Time: 09/07/17  4:42 PM  Result Value Ref Range   Glucose-Capillary 84 65 - 99 mg/dL  Glucose, capillary     Status: Abnormal   Collection Time: 09/08/17  6:15 AM  Result Value Ref Range   Glucose-Capillary 121 (H) 65 - 99 mg/dL  Glucose, capillary     Status: None   Collection Time: 09/08/17 11:20 AM  Result Value Ref Range   Glucose-Capillary 93 65 - 99 mg/dL  Glucose, capillary     Status: Abnormal   Collection Time: 09/08/17  5:19 PM  Result Value Ref Range   Glucose-Capillary 103 (H) 65 - 99 mg/dL  Glucose, capillary     Status: None   Collection Time: 09/09/17  6:36 AM  Result Value Ref Range   Glucose-Capillary 97 65 - 99 mg/dL  Glucose, capillary     Status: None   Collection Time: 09/09/17 12:07 PM  Result Value Ref Range   Glucose-Capillary 99 65 - 99 mg/dL  Glucose, capillary     Status: Abnormal   Collection Time: 09/09/17  4:35 PM  Result Value Ref Range   Glucose-Capillary 120 (H) 65 - 99 mg/dL  Basic metabolic panel     Status: Abnormal   Collection Time: 09/10/17  4:39 AM  Result Value Ref Range   Sodium 136 135 - 145 mmol/L   Potassium 4.4 3.5 - 5.1 mmol/L   Chloride 99 (L) 101 - 111 mmol/L   CO2 25 22 - 32 mmol/L   Glucose, Bld 117 (H) 65 - 99 mg/dL   BUN 28 (H) 6 - 20 mg/dL   Creatinine, Ser 1.58 (H) 0.61 - 1.24 mg/dL   Calcium 9.4 8.9 - 10.3 mg/dL   GFR calc non Af Amer 40 (L) >60 mL/min   GFR calc Af Amer 47 (L) >60 mL/min    Comment: (NOTE) The eGFR has been calculated using the CKD EPI equation. This calculation has not been validated in all clinical situations. eGFR's persistently <60 mL/min signify possible Chronic Kidney Disease.    Anion gap 12 5 - 15    Comment: Performed at [facility] [address], [place], [place] [address]  Glucose, capillary     Status: Abnormal   Collection Time: 09/10/17  6:53 AM  Result Value Ref Range   Glucose-Capillary 116 (H) 65 - 99 mg/dL   Comment 1 Notify RN     Gen NAD. Vital signs reviewed. HENT: poor dentition. Normocephalic, atraumatic.  Eyes: EOMI. No discharge. Cardio: Irregularly irregular Resp: CTA B/L and unlabored GI: BS positive and ND Musc/Skel:  No edema, no tenderness Neuro: Alert and oriented x1. Motor: 4+/5 R delt , bi,tri, grip, HF, KE ,ADF (unchanged) Aphasia Skin:   Intact. Warm and Dry.  Assessment/Plan: 1. Functional deficits secondary to  Left Thalamic ICH which require 3+ hours per day of interdisciplinary therapy in a comprehensive inpatient rehab setting. Physiatrist is providing close team supervision and 24 hour management of active medical problems listed below. Physiatrist and rehab team continue to assess barriers to discharge/monitor patient progress toward functional and medical goals. FIM: Function - Bathing Position: Standing at sink Body parts bathed by patient: Right arm, Left arm, Chest, Abdomen, Front perineal area, Right upper leg, Left upper leg, Buttocks Body parts bathed by helper: Left lower leg, Right lower leg Assist Level: Touching or steadying assistance(Pt > 75%)  Function- Upper Body Dressing/Undressing What is the patient wearing?: Pull over shirt/dress Pull over shirt/dress - Perfomed by patient: Thread/unthread right sleeve, Thread/unthread left  sleeve, Put head through opening, Pull shirt over trunk Pull over shirt/dress - Perfomed by helper: Pull shirt over trunk Assist Level: Supervision or verbal cues Function - Lower Body Dressing/Undressing Lower body dressing/undressing activity did not occur: Refused What is the patient wearing?: Pants, Non-skid slipper socks Position: Other (comment)(commode) Pants- Performed by patient: Pull pants up/down, Thread/unthread left pants leg, Thread/unthread right pants leg Pants- Performed by helper: Pull pants up/down, Fasten/unfasten pants Non-skid slipper socks- Performed by patient: Don/doff left sock, Don/doff right sock Assist for footwear: Supervision/touching assist Assist for lower body dressing: Touching or steadying assistance (Pt > 75%)  Function - Toileting Toileting steps completed by patient: Adjust clothing prior to toileting, Performs perineal hygiene, Adjust clothing after toileting Toileting steps completed by helper: Performs perineal hygiene Toileting Assistive Devices: Grab bar or rail Assist level: Touching or steadying assistance (Pt.75%)  Function - Air cabin crew transfer assistive device: Grab bar Assist level to toilet: Touching or steadying assistance (Pt > 75%) Assist level from toilet: Touching or steadying assistance (Pt > 75%)  Function - Chair/bed transfer Chair/bed transfer method: Ambulatory, Stand pivot Chair/bed transfer assist level: Supervision or verbal cues Chair/bed transfer assistive device: Armrests, Walker Chair/bed transfer details: Verbal cues for technique, Verbal cues for precautions/safety, Verbal cues for safe use of DME/AE  Function - Locomotion: Wheelchair Will patient use wheelchair at discharge?: No(pt is primary ambulator) Type: Manual Max wheelchair distance: 146f  Assist Level: Supervision or verbal cues Assist Level: Supervision or verbal cues Wheel 150 feet activity did not occur: Safety/medical concerns Turns  around,maneuvers to table,bed, and toilet,negotiates 3% grade,maneuvers on rugs and over doorsills: No Function - Locomotion: Ambulation Assistive device: Walker-rolling Max distance: 150' Assist level: Supervision or verbal cues Assist level: Supervision or verbal cues Assist level: Supervision or verbal cues Walk 150 feet activity did not occur: Safety/medical concerns Assist level: Supervision or verbal cues Walk 10 feet on uneven surfaces activity did not occur: Safety/medical concerns Assist level: Supervision or verbal cues  Function - Comprehension Comprehension: Auditory Comprehension assist level: Understands basic 75 - 89% of the time/ requires cueing 10 - 24% of the time  Function - Expression Expression: Verbal Expression assist level: Expresses basic 90% of the time/requires cueing < 10% of the time.  Function - Social Interaction Social Interaction assist level: Interacts appropriately 90% of the time - Needs monitoring or encouragement for participation or interaction.  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   Will order follow-up CT and if ICH resolved, will add Elavil this to aspirin secondary to history of atrial fibrillation  Cont CIR  2. DVT Prophylaxis/Anticoagulation: Subcutaneous heparin initiated 08/28/2017 3. Pain Management:Tylenol as needed 4. Mood:Paxil 20 mg daily, Seroquel 25 mg nightly Xanax 0.5 mg 3 times daily as needed (home meds) 5. Neuropsych: This patientisnot fully capable of making decisions on  hisown behalf. 6. Skin/Wound Care:Routine skin checks 7. Fluids/Electrolytes/Nutrition:Routine I&O's  8.Atrial fibrillation. Aspirin to be initiated 81 mg 09/01/2017. Cardiac rate controlled 9.Hypertension. Norvasc 10 mg daily, lisinopril 20 mg daily, Lopressor 12.5 mg twice daily  HCTZ started on 4/21 Vitals:   09/09/17 1548 09/10/17 0452  BP: 123/86 124/84  Pulse: 78 (!) 56  Resp: 18 18  Temp: 98.5 F (36.9 C) 98.5 F (36.9 C)  SpO2: 98% 97%   Controlled on 4/26 10.Prediabetic. SSI.   CBGs relatively controlled on 4/26 11.BPH. Proscar 5 mg daily.  Enablex started on 4/24  PVRs improving 12.Hyperlipidemia. Crestor 13. AKI on CKD  Creatinine 1.5 on 4/26  Encourage fluids  Labs ordered for Monday   LOS (Days) 10 A FACE TO FACE EVALUATION WAS PERFORMED   Lorie Phenix 09/10/2017, 9:34 AM

## 2017-09-10 NOTE — Discharge Instructions (Signed)
Inpatient Rehab Discharge Instructions  Eugene [name] Discharge date and time: No discharge date for patient encounter.   Activities/Precautions/ Functional Status: Activity: activity as tolerated Diet: regular diet Wound Care: none needed Functional status:  ___ No restrictions     ___ Walk up steps independently ___ 24/7 supervision/assistance   ___ Walk up steps with assistance ___ Intermittent supervision/assistance  ___ Bathe/dress independently ___ Walk with walker     _x__ Bathe/dress with assistance ___ Walk Independently    ___ Shower independently ___ Walk with assistance    ___ Shower with assistance ___ No alcohol     ___ Return to work/school ________  Special Instructions:  Follow-up Dr. Leonie Man of neurology services [phone] for follow-up cranial CT scan 2 weeks.  Plan was for Eliquis of intracerebral hemorrhage resolved in addition to aspirin   COMMUNITY REFERRALS UPON DISCHARGE:    OUTPATIENT REHAB: [facility]-PT,OT,SP APPOINTMENT: April 29 8:30-9:30-PT, 9:30-10:15-SP AND 11:00-11:45-OT  Medical Equipment/Items Ordered:ROLLING WALKER, [place]  [phone]  GENERAL COMMUNITY RESOURCES FOR PATIENT/FAMILY: Support Groups:[facility] SUPPORT GROUP EVERY SECOND Thursday @ 3:00-4:00 PM ( SEPT-MAY) ON THE REHAB UNIT QUESTIONS CONTACT [facility] [phone]  My questions have been answered and I understand these instructions. I will adhere to these goals and the provided educational materials after my discharge from the hospital.  Patient/Caregiver Signature _______________________________ Date __________  Clinician Signature _______________________________________ Date __________  Please bring this form and your medication list with you to all your follow-up doctor's appointments.

## 2017-09-10 NOTE — Progress Notes (Addendum)
Discussed CT results with Dr. Erlinda Hong who felt that CT head shows great improvement with minimal residual blood. He felt that patient could be cleared to start Eliquis as per Dr. Clydene Fake recommendations.  Discussed with patient and daughter --Rx given for discharge.

## 2017-09-10 NOTE — Progress Notes (Signed)
Occupational Therapy Session Note  Patient Details  Name: Eugene [name] MRN: [id number] Date of Birth: January 11, 1941  Today's Date: 09/10/2017 OT Individual Time: 1025-8527 OT Individual Time Calculation (min): 24 min     Skilled Therapeutic Interventions/Progress Updates:    1;1. Pt agreeable to OT tx session with no c/o pain. Pt ambulates to/from all tx destinations A only to hold up jeans as belt was not tight enough and pants were slipping off buttocks. Pt requires mod VC for keeping feet behind walker, reaching back for hand placement during transitional movement and RW positioning during functional transfers. Pt completes couch, recliner and TTB transfer with supervision and VC for sequencing/problemsolving as pt walks up to surface and walks walker straight on into chair/bench etc. When cued for good safety habits pt continueally states, "Ok, but I wont remember." Stressed importance of good habits to decrease risk of falling. Exited session with pt seated in bed, call light in reach, daughter and direct handoff to SLP.  Therapy Documentation Precautions:  Precautions Precautions: Fall Restrictions Weight Bearing Restrictions: No  See Function Navigator for Current Functional Status.   Therapy/Group: Individual Therapy  Tonny Branch 09/10/2017, 3:24 PM

## 2017-09-10 NOTE — Progress Notes (Signed)
Physical Therapy Discharge Summary  Patient Details  Name: Eugene [name] MRN: [id number] Date of Birth: [birth year]  Today's Date: 09/10/2017 PT Individual Time: 0800-0907 PT Individual Time Calculation (min): 67 min   Pt in recliner and agreeable to therapy, no c/o pain. Performed functional mobility as detailed below including bed mobility, household and community gait, full fight of stairs as well as stairs mimicking home set-up, car transfer, floor transfer, furniture transfer, and bed mobility. Focused on gait in home environment requiring pt to negotiate unfamiliar obstacles w/ RW. Supervision overall and pt was able to safely adjust walker when bumping into objects w/o prompting from therapist. Spent time educating pt and son on typical progression of stroke impairments w/ focus on functional balance. Returned to room and ended session in recliner and in care of son, all needs met.   Patient has met 8 of 8 long term goals due to improved activity tolerance, improved balance, improved postural control, increased strength, improved attention, improved awareness and improved coordination.  Patient to discharge at an ambulatory level Supervision.   Patient's care partner is independent to provide the necessary physical and cognitive assistance at discharge. Son and daughter plan to arrange for 24/7 supervision at d/c and have been educated on providing supervision level assist for transfers, car transfers, household and community level gait, stairs, floor transfers, and furniture transfers.   Reasons goals not met: n/a  Recommendation:  Patient will benefit from ongoing skilled PT services in home health setting to continue to advance safe functional mobility, address ongoing impairments in functional balance, gait, endurance, LE strength, environmental and safety awareness, and cognition, and minimize fall risk.  Equipment: RW  Reasons for discharge: treatment goals met and discharge  from hospital  Patient/family agrees with progress made and goals achieved: Yes  PT Discharge Precautions/Restrictions Precautions Precautions: Fall Restrictions Weight Bearing Restrictions: No Pain Pain Assessment Pain Scale: 0-10 Pain Score: 0-No pain Vision/Perception  Vision - Assessment Alignment/Gaze Preference: Within Defined Limits Tracking/Visual Pursuits: Decreased smoothness of eye movement to RIGHT inferior field;Decreased smoothness of eye movement to RIGHT superior field Saccades: Undershoots Perception Perception: Impaired Inattention/Neglect: Does not attend to right side of body;Does not attend to right visual field Praxis Praxis: Intact  Cognition Overall Cognitive Status: Impaired/Different from baseline Arousal/Alertness: Awake/alert Orientation Level: Oriented to person;Oriented to place;Oriented to situation;Oriented to time Memory: Impaired Awareness: Impaired Problem Solving: Impaired Safety/Judgment: Impaired Comments: decreased awareness of deficits and decreased safety/environmental awareness Sensation Sensation Light Touch: Appears Intact Proprioception: Appears Intact Coordination Gross Motor Movements are Fluid and Coordinated: Yes Fine Motor Movements are Fluid and Coordinated: No Heel Shin Test: [facility] Motor  Motor Motor: Hemiplegia;Abnormal tone Motor - Discharge Observations: Very mild R hemi, some increase in hamstring tone, generalized weakness  Mobility Bed Mobility Bed Mobility: Rolling Right;Rolling Left;Supine to Sit;Sit to Supine Rolling Right: 6: Modified independent (Device/Increase time) Rolling Left: 6: Modified independent (Device/Increase time) Supine to Sit: 6: Modified independent (Device/Increase time) Sit to Supine: 6: Modified independent (Device/Increase time) Transfers Transfers: Yes Sit to Stand: 5: Supervision Stand Pivot Transfers: 5: Supervision Locomotion  Ambulation Ambulation: Yes Ambulation/Gait  Assistance: 5: Supervision Ambulation Distance (Feet): 150 Feet Assistive device: Rolling walker Ambulation/Gait Assistance Details: Verbal cues for safe use of DME/AE;Verbal cues for precautions/safety Gait Gait: Yes Gait Pattern: Impaired Gait Pattern: Ataxic;Wide base of support;Right flexed knee in stance;Left flexed knee in stance Stairs / Additional Locomotion Stairs: Yes Stairs Assistance: 4: Min guard Stairs Assistance Details: Verbal cues for precautions/safety;Verbal cues for  technique;Verbal cues for gait pattern;Verbal cues for safe use of DME/AE Stair Management Technique: One rail Right Number of Stairs: 12 Height of Stairs: 6 Ramp: 5: Supervision Curb: 5: Supervision Wheelchair Mobility Wheelchair Mobility: No  Trunk/Postural Assessment  Cervical Assessment Cervical Assessment: Exceptions to WFL(forward head) Thoracic Assessment Thoracic Assessment: Exceptions to WFL(increased kyphosis) Lumbar Assessment Lumbar Assessment: Exceptions to WFL(posterior pelvic tilt) Postural Control Postural Control: Deficits on evaluation(delayed)  Balance Balance Balance Assessed: Yes Dynamic Sitting Balance Dynamic Sitting - Balance Support: During functional activity;Feet supported;No upper extremity supported Dynamic Sitting - Level of Assistance: 6: Modified independent (Device/Increase time) Static Standing Balance Static Standing - Balance Support: During functional activity;No upper extremity supported Static Standing - Level of Assistance: 5: Stand by assistance Dynamic Standing Balance Dynamic Standing - Balance Support: During functional activity;No upper extremity supported Dynamic Standing - Level of Assistance: 5: Stand by assistance Extremity Assessment  RLE Assessment RLE Assessment: Within Functional Limits(delayed inhibition compared to LLE, otherwise strength WFL) LLE Assessment LLE Assessment: Within Functional Limits   See Function Navigator for Current  Functional Status.  Eugene [name] K Arnette 09/10/2017, 9:12 AM

## 2017-09-10 NOTE — Progress Notes (Signed)
Speech Language Pathology Discharge Summary  Patient Details  Name: Eugene [name] MRN: [id number] Date of Birth: 07/30/1940  Today's Date: 09/10/2017 SLP Individual Time: 1530-1600 SLP Individual Time Calculation (min): 30 min   Skilled Therapeutic Interventions:  Skilled treatment session focused on communication goals and completion of education with pt and daughter. 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 SLP provided Min A semantic cues to increase word finding. Education provided on decreased safety awareness and impact that decreased communication can have on requesting help such as calling 911. Daughter and pt both voiced understanding.      Patient has met 3 of 3 long term goals.  Patient to discharge at overall Min level.    Clinical Impression/Discharge Summary:   Pt has made good progress in skilled ST sessions and as a result has met 3 of 3 STGs and is able to communicate with Min A. Semantic cues are helpful in increasing communicative abilities.   Care Partner:  Caregiver Able to Provide Assistance: Yes  Type of Caregiver Assistance: Cognitive  Recommendation:  24 hour supervision/assistance;Home Health SLP  Rationale for SLP Follow Up: Maximize functional communication;Reduce caregiver burden   Equipment:     Reasons for discharge: Discharged from hospital;Treatment goals met   Patient/Family Agrees with Progress Made and Goals Achieved: Yes   Function:  Eating Eating                 Cognition Comprehension Comprehension assist level: Understands basic 50 - 74% of the time/ requires cueing 25 - 49% of the time  Expression   Expression assist level: Expresses basic 90% of the time/requires cueing < 10% of the time.  Social Interaction Social Interaction assist level: Interacts appropriately 75 - 89% of the time - Needs redirection for appropriate language or to  initiate interaction.  Problem Solving Problem solving assist level: Solves basic 90% of the time/requires cueing < 10% of the time  Memory Memory assist level: Recognizes or recalls 50 - 74% of the time/requires cueing 25 - 49% of the time;Recognizes or recalls 75 - 89% of the time/requires cueing 10 - 24% of the time     09/10/2017, 5:08 PM    

## 2017-09-10 NOTE — Progress Notes (Signed)
Occupational Therapy Discharge Summary  Patient Details  Name: Eugene [name] MRN: [id number] Date of Birth: 04-Feb-1941  Today's Date: 09/10/2017 OT Individual Time: 0940-1040 OT Individual Time Calculation (min): 60 min  OT treatment session focused on pt/family education and increased independence with BADL tasks. Reviewed safety precautions with pt and family and discussed falls prevention. Reviewed how to cue pt with son and had son ambulate to bathroom and transfer into shower with pt. Son did a good job of providing appropriate cues for safety, RW position, and sequencing within BADLs. Dressing completed seated in recliner with min cues for sequencing. Dynamic balance and standing endurance with toothbrushing and shaving task at the sink. Pt needed verbal cues to add toothpaste to toothbrush and sequence grooming task. Pt then ambulated in hallway and completed dynavision activity with much improved attention and visual scanning to the R. Pt returned to room at end of session and left seated in recliner with safety belt on, chair alarm, and family present.    Patient has met 10 of 10 long term goals due to improved activity tolerance, improved balance, postural control, ability to compensate for deficits, functional use of  RIGHT upper and RIGHT lower extremity, improved attention, improved awareness and improved coordination.  Patient to discharge at overall Supervision level.  Patient's care partner is independent to provide the necessary physical and cognitive assistance at discharge.    Reasons goals not met: n/a  Recommendation:  Patient will benefit from ongoing skilled OT services in home health setting to continue to advance functional skills in the area of BADL.  Equipment: tub transfer bench, 3-in-1 BSC  Reasons for discharge: treatment goals met and discharge from hospital  Patient/family agrees with progress made and goals achieved: Yes  OT  Discharge Precautions/Restrictions  Precautions Precautions: Fall Restrictions Weight Bearing Restrictions: No Pain Pain Assessment Pain Scale: 0-10 Pain Score: 0-No pain ADL ADL ADL Comments: Please see functional navigator Vision Baseline Vision/History: Wears glasses Wears Glasses: At all times Patient Visual Report: No change from baseline Perception  Perception: Impaired Inattention/Neglect: Does not attend to right side of body;Does not attend to right visual field(improved since eval) Praxis Praxis: Intact Cognition Overall Cognitive Status: Impaired/Different from baseline Arousal/Alertness: Awake/alert Orientation Level: Oriented to person;Oriented to place;Oriented to situation Memory: Impaired Awareness: Impaired Awareness Impairment: Emergent impairment Problem Solving: Impaired Safety/Judgment: Impaired Comments: decreased awareness of deficits and decreased safety/environmental awareness Sensation Sensation Light Touch: Appears Intact Proprioception: Appears Intact Coordination Gross Motor Movements are Fluid and Coordinated: Yes Fine Motor Movements are Fluid and Coordinated: No Coordination and Movement Description: continues to have mild dysmetria, but improved since eval Heel Shin Test: [name] Motor  Motor Motor: Hemiplegia Motor - Discharge Observations: Mild R hemiplegia Mobility  Transfers Sit to Stand: 5: Supervision  Trunk/Postural Assessment  Cervical Assessment Cervical Assessment: Exceptions to WFL(forward head) Thoracic Assessment Thoracic Assessment: Exceptions to WFL(increased kyphosis) Lumbar Assessment Lumbar Assessment: (0) Postural Control Postural Control: Deficits on evaluation(delayed)  Balance Balance Balance Assessed: Yes Dynamic Sitting Balance Dynamic Sitting - Balance Support: During functional activity Dynamic Sitting - Level of Assistance: 6: Modified independent (Device/Increase time) Static Standing  Balance Static Standing - Balance Support: During functional activity Static Standing - Level of Assistance: 5: Stand by assistance Dynamic Standing Balance Dynamic Standing - Balance Support: During functional activity Dynamic Standing - Level of Assistance: 5: Stand by assistance Extremity/Trunk Assessment RUE Assessment RUE Assessment: Within Functional Limits RUE Strength Right Shoulder Flexion: 4+/5 LUE Assessment LUE Assessment: Within Functional  Limits   See Function Navigator for Current Functional Status.  [name] 09/10/2017, 3:48 PM

## 2017-09-10 NOTE — Progress Notes (Signed)
Social Work  Discharge Note  The overall goal for the admission was met for: DC SAT 4/27  Discharge location: [place] WILL NEED 24 HR SUPERVISION  Length of Stay: Yes-11 DAYS  Discharge activity level: Yes-SUPERVISION LEVEL  Home/community participation: Yes  Services provided included: MD, RD, PT, OT, SLP, RN, CM, TR, Pharmacy, Neuropsych and SW  Financial Services: Private Insurance: BLUE MEDICARE  Follow-up services arranged: [facility];PT,OT,SP APPOINTMENT 4/29 8:30-11:45 ALL THREE THERAPIES. [facility] PROVIDED ROLLING WALKER, 3IN1 AND TUB BENCH  Comments (or additional information):SON AND DAUGHTER HERE FOR EDUCATION WILL PROVIDE 24 HR SUPERVISION ARE HOPEFUL IT WILL BE SHORT TERM. PT WILL PROBABLY NEED 24 HR SUPERVISION FOR AN EXTENDED PERIOD OF TIME  Patient/Family verbalized understanding of follow-up arrangements: Yes  Individual responsible for coordination of the follow-up plan: CINDY-DAUGHTER AND RICK-SON  Confirmed correct DME delivered: Elease Hashimoto 09/10/2017    Elease Hashimoto

## 2017-09-11 LAB — GLUCOSE, CAPILLARY: Glucose-Capillary: 130 mg/dL — ABNORMAL HIGH (ref 65–99)

## 2017-09-11 NOTE — Progress Notes (Signed)
Patient and family received discharge instructions from Algis Liming, PA-C on Friday 09/10/17 with verbal understanding. Patient discharged to home with family 09/11/17 with patient belongings and equipment.

## 2017-09-11 NOTE — Progress Notes (Signed)
Subjective/Complaints: No new issues.  Ready to go home  ROS: Limited due to cognitive/behavioral   Objective: Vital Signs: Blood pressure 139/88, pulse 61, temperature 98.3 F (36.8 C), temperature source Oral, resp. rate 16, height 6' (1.829 m), weight 84.6 kg (186 lb 8.2 oz), SpO2 96 %. Ct Head Wo Contrast  Result Date: 09/10/2017 CLINICAL DATA:  Intracranial hemorrhage follow-up EXAM: CT HEAD WITHOUT CONTRAST TECHNIQUE: Contiguous axial images were obtained from the base of the skull through the vertex without intravenous contrast. COMPARISON:  08/27/2017 FINDINGS: Brain: Decreasing density of the left thalamic hematoma. High-density only encompasses a 9 mm area today. There is associated swelling, which is expected, with mild encroachment on the third ventricle. No evidence of acute or interval hemorrhage. No hydrocephalus or collection. Advanced chronic small vessel ischemic type change in the cerebral white matter. 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 Vascular: Atherosclerotic calcification. Skull: No acute or aggressive finding Sinuses/Orbits: Patchy mucosal thickening in the paranasal sinuses. Bilateral cataract resection. IMPRESSION: 1. Expected evolution of subacute left thalamic hematoma. No new abnormality. 2. Advanced chronic small vessel ischemia. Electronically Signed   By: Monte Fantasia M.D.   On: 09/10/2017 16:41   Results for orders placed or performed during the hospital encounter of 08/31/17 (from the past 72 hour(s))  Glucose, capillary     Status: None   Collection Time: 09/08/17 11:20 AM  Result Value Ref Range   Glucose-Capillary 93 65 - 99 mg/dL  Glucose, capillary     Status: Abnormal   Collection Time: 09/08/17  5:19 PM  Result Value Ref Range   Glucose-Capillary 103 (H) 65 - 99 mg/dL  Glucose, capillary     Status: None   Collection Time: 09/09/17  6:36 AM  Result Value Ref Range   Glucose-Capillary 97 65 - 99 mg/dL  Glucose, capillary      Status: None   Collection Time: 09/09/17 12:07 PM  Result Value Ref Range   Glucose-Capillary 99 65 - 99 mg/dL  Glucose, capillary     Status: Abnormal   Collection Time: 09/09/17  4:35 PM  Result Value Ref Range   Glucose-Capillary 120 (H) 65 - 99 mg/dL  Basic metabolic panel     Status: Abnormal   Collection Time: 09/10/17  4:39 AM  Result Value Ref Range   Sodium 136 135 - 145 mmol/L   Potassium 4.4 3.5 - 5.1 mmol/L   Chloride 99 (L) 101 - 111 mmol/L   CO2 25 22 - 32 mmol/L   Glucose, Bld 117 (H) 65 - 99 mg/dL   BUN 28 (H) 6 - 20 mg/dL   Creatinine, Ser 1.58 (H) 0.61 - 1.24 mg/dL   Calcium 9.4 8.9 - 10.3 mg/dL   GFR calc non Af Amer 40 (L) >60 mL/min   GFR calc Af Amer 47 (L) >60 mL/min    Comment: (NOTE) The eGFR has been calculated using the CKD EPI equation. This calculation has not been validated in all clinical situations. eGFR's persistently <60 mL/min signify possible Chronic Kidney Disease.    Anion gap 12 5 - 15    Comment: Performed at [facility] [address], [place], [place] [address]  Glucose, capillary     Status: Abnormal   Collection Time: 09/10/17  6:53 AM  Result Value Ref Range   Glucose-Capillary 116 (H) 65 - 99 mg/dL   Comment 1 Notify RN   Glucose, capillary     Status: Abnormal   Collection Time:  09/10/17 11:54 AM  Result Value Ref Range   Glucose-Capillary 101 (H) 65 - 99 mg/dL  Glucose, capillary     Status: Abnormal   Collection Time: 09/10/17  4:53 PM  Result Value Ref Range   Glucose-Capillary 116 (H) 65 - 99 mg/dL  Glucose, capillary     Status: Abnormal   Collection Time: 09/11/17  6:36 AM  Result Value Ref Range   Glucose-Capillary 130 (H) 65 - 99 mg/dL   Comment 1 Notify RN     Constitutional: No distress . Vital signs reviewed. HEENT: EOMI, oral membranes moist Neck: supple Cardiovascular: IRR without murmur. No JVD    Respiratory: CTA Bilaterally without wheezes or rales. Normal effort    GI: BS +, non-tender,  non-distended   Musc/Skel:  No edema, no tenderness Neuro: Alert and oriented x1. Motor: 4+/5 R delt , bi,tri, grip, HF, KE ,ADF (unchanged) Aphasia Skin:   Intact. Warm and Dry.  Assessment/Plan: 1. 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 Physiatrist is providing close team supervision and 24 hour management of active medical problems listed below. Physiatrist and rehab team continue to assess barriers to discharge/monitor patient progress toward functional and medical goals. FIM: Function - Bathing Position: Shower Body parts bathed by patient: Right arm, Left arm, Chest, Abdomen, Front perineal area, Buttocks, Left upper leg, Right lower leg, Right upper leg, Left lower leg Body parts bathed by helper: Left lower leg, Right lower leg Assist Level: Supervision or verbal cues  Function- Upper Body Dressing/Undressing What is the patient wearing?: Pull over shirt/dress Pull over shirt/dress - Perfomed by patient: Thread/unthread right sleeve, Thread/unthread left sleeve, Pull shirt over trunk, Put head through opening Pull over shirt/dress - Perfomed by helper: Pull shirt over trunk Assist Level: Supervision or verbal cues Function - Lower Body Dressing/Undressing Lower body dressing/undressing activity did not occur: Refused What is the patient wearing?: Pants, Non-skid slipper socks Position: Wheelchair/chair at sink Pants- Performed by patient: Thread/unthread right pants leg, Thread/unthread left pants leg, Pull pants up/down, Fasten/unfasten pants Pants- Performed by helper: Pull pants up/down, Fasten/unfasten pants Non-skid slipper socks- Performed by patient: Don/doff left sock, Don/doff right sock Assist for footwear: Setup Assist for lower body dressing: Supervision or verbal cues  Function - Toileting Toileting steps completed by patient: Adjust clothing prior  to toileting, Performs perineal hygiene, Adjust clothing after toileting Toileting steps completed by helper: Performs perineal hygiene Toileting Assistive Devices: Grab bar or rail Assist level: Supervision or verbal cues  Function - Air cabin crew transfer assistive device: Walker, Grab bar Assist level to toilet: Supervision or verbal cues Assist level from toilet: Supervision or verbal cues  Function - Chair/bed transfer Chair/bed transfer method: Ambulatory, Stand pivot Chair/bed transfer assist level: Supervision or verbal cues Chair/bed transfer assistive device: Armrests, Walker Chair/bed transfer details: Verbal cues for technique, Verbal cues for precautions/safety, Verbal cues for safe use of DME/AE  Function - Locomotion: Wheelchair Will patient use wheelchair at discharge?: No(pt is primary ambulator) Type: Manual Max wheelchair distance: 115f  Assist Level: Supervision or verbal cues Assist Level: Supervision or verbal cues Wheel 150 feet activity did not occur: Safety/medical concerns Turns around,maneuvers to table,bed, and toilet,negotiates 3% grade,maneuvers on rugs and over doorsills: No Function - Locomotion: Ambulation Assistive device: Walker-rolling Max distance: 150' Assist level: Supervision or verbal cues Assist level: Supervision or verbal cues Assist level: Supervision or verbal cues Walk 150 feet  activity did not occur: Safety/medical concerns Assist level: Supervision or verbal cues Walk 10 feet on uneven surfaces activity did not occur: Safety/medical concerns Assist level: Supervision or verbal cues  Function - Comprehension Comprehension: Auditory Comprehension assist level: Understands basic 90% of the time/cues < 10% of the time  Function - Expression Expression: Verbal Expression assist level: Expresses basic 90% of the time/requires cueing < 10% of the time.  Function - Social Interaction Social Interaction assist level:  Interacts appropriately 75 - 89% of the time - Needs redirection for appropriate language or to initiate interaction.  Function - Problem Solving Problem solving assist level: Solves basic 90% of the time/requires cueing < 10% of the time  Function - Memory Memory assist level: Recognizes or recalls 90% of the time/requires cueing < 10% of the time Patient normally able to recall (first 3 days only): Staff names and faces, That he or she is in a hospital, Current season  Medical Problem List and Plan: 1.Right sided weakness, apraxia and aphasiasecondary to left thalamic ICH on 4/10.   Follow-up CT with improvement of ICH. eliquis added   -Discharge home today  -Patient to see Rehab MD/provider in the office for transitional care encounter in 1-2 weeks.   2. DVT Prophylaxis/Anticoagulation: Subcutaneous heparin initiated 08/28/2017 3. Pain Management:Tylenol as needed 4. Mood:Paxil 20 mg daily, Seroquel 25 mg nightly Xanax 0.5 mg 3 times daily as needed (home meds) 5. Neuropsych: This patientisnot fully capable of making decisions on hisown behalf. 6. Skin/Wound Care:Routine skin checks 7. Fluids/Electrolytes/Nutrition:Routine I&O's  8.Atrial fibrillation. Aspirin to be initiated 81 mg 09/01/2017. Cardiac rate controlled 9.Hypertension. Norvasc 10 mg daily, lisinopril 20 mg daily, Lopressor 12.5 mg twice daily  HCTZ started on 4/21 Vitals:   09/10/17 1500 09/11/17 0345  BP: 123/76 139/88  Pulse: (!) 55 61  Resp: 18 16  Temp: 98.6 F (37 C) 98.3 F (36.8 C)  SpO2: 99% 96%   Controlled on 4/27 10.Prediabetic. SSI.   CBGs relatively controlled on 4/27 11.BPH. Proscar 5 mg daily.  Enablex started on 4/24  PVRs improving 12.Hyperlipidemia. Crestor 13. AKI on CKD  Creatinine 1.5 on 4/26  Encourage fluids  Labs ordered for Monday   LOS (Days) 11 A FACE TO FACE EVALUATION WAS PERFORMED  Meredith Staggers 09/11/2017, 8:43 AM

## 2017-09-11 NOTE — Discharge Summary (Signed)
NAMEJAICE, DIGIOIA           ACCOUNT NO.:  [id number]  MEDICAL RECORD NO.:  [id number]  LOCATION:                                 FACILITY:  PHYSICIAN:  Charlett Blake, M.D.DATE OF BIRTH:  [birth year]  DATE OF ADMISSION:  08/31/2017 DATE OF DISCHARGE:  09/11/2017                              DISCHARGE SUMMARY   DISCHARGE DIAGNOSES: 1. Left thalamic ICH. 2. Subcutaneous heparin for deep venous thrombosis prophylaxis     initiated on August 28, 2017. 3. Pain management. 4. Mood. 5. Atrial fibrillation. 6. Hypertension. 7. Prediabetic. 8. Benign prostatic hypertrophy. 9. Hyperlipidemia. 10.Acute kidney injury on chronic kidney disease.  HISTORY OF PRESENT ILLNESS:  This is a 77 year old right-handed male with history of hypertension; hyperlipidemia; tobacco abuse; atrial fibrillation, maintained on Xarelto with questionable medical compliance.  Per chart review, he lives alone and independent prior to admission and driving.  Presented on August 25, 2017, with right-sided weakness and aphasia.  Blood pressure 157/98.  CT of the head showed a left thalamic hemorrhage with estimated blood volume of 8 mL.  Mild surrounding edema and regional mass effect.  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  Neurology consulted.  Initially on aspirin therapy.  After ICH with plan to repeat CT of the head, 2-3 weeks.  If ICH resolved, planned Eliquis to addition of aspirin.  Subcutaneous heparin for DVT prophylaxis initiated on August 28, 2017.  Echocardiogram with ejection fraction of 60%.  No wall motion abnormalities.  The patient was admitted for comprehensive rehab program.  PAST MEDICAL HISTORY:  See discharge diagnoses.  SOCIAL HISTORY:  Lives alone independent prior to  admission.  FUNCTIONAL STATUS:  Upon admission to [facility] was moderate assist, 100 feet rolling walker, minimal assist sit-to-stand, mod-to-max assist activities of daily living.  PHYSICAL EXAMINATION:  VITAL SIGNS:  Blood pressure 142/99, pulse 67, temperature 98, and respirations 17. GENERAL:  Alert male, in no acute distress.  Oriented to person, place, and time. HEENT:  EOM is intact. NECK:  Supple.  Nontender.  No JVD. CARDIAC:  Rate controlled. ABDOMEN:  Soft, nontender.  Good bowel sounds. LUNGS:  Clear to auscultation without wheeze.  REHABILITATION HOSPITAL COURSE:  The patient was admitted to Inpatient Rehab Services.  Therapies initiated on a 3-hour daily basis, consisting of physical therapy, occupational therapy, and rehabilitation nursing. The following issues were addressed during the patient's rehabilitation stay.  Pertaining to Mr. Preziosi left thalamic ICH remained stable. Low-dose aspirin had been initiated.  Subcutaneous heparin on August 28, 2017.  A followup CT of the head, completed with results 09/10/2017 showing expected evolution of subacute left thalamic hematoma.  CT scan reviewed by neurology services advised to begin Eliquis as well as continue low-dose aspirin.  Blood pressures remained controlled on Norvasc, hydrochlorothiazide, lisinopril.  Mood stable, continued on Seroquel.  Heart rate, controlled.  No chest pain or shortness of breath.  Crestor for hyperlipidemia.  He did have a history of BPH, maintained on Proscar, voiding without difficulty.  The  patient received weekly collaborative interdisciplinary team conferences to discuss estimated length of stay, family teaching, any barriers to discharge.  Performed functional mobility including bed mobility, household and community ambulation, full flight of stairs, car transfers, floor transfers, furniture transfers, and bed mobility, focusing on home environment supervision level using a  rolling walker. The patient can gather his belongings for activities of daily living and homemaking and was advised no driving.  He was discharged to home.  DISCHARGE MEDICATIONS:  Included: 1. Norvasc 10 mg p.o. daily. 2. Aspirin 81 mg p.o. daily. 3. Enablex 7.5 mg p.o. daily. 4. Proscar 5 mg p.o. daily. 5. Hydrochlorothiazide 12.5 mg p.o. daily. 6. Lisinopril 20 mg p.o. daily. 7. Protonix 40 mg p.o. daily. 8. Paxil 20 mg p.o. daily. 9. Seroquel 25 mg p.o. at bedtime. 10.Crestor 20 mg p.o. daily. 11.Tylenol as needed.  DIET:  His diet was regular.  SPECIAL INSTRUCTIONS:  A followup cranial CT scan was completed 09/10/2017 reviewed by neurology.  Noted expected evolution of subacute left thalamic hematoma.  It was advised to begin Eliquis in addition to present aspirin.  The patient would follow up Dr. Alysia Penna at the [facility] as directed; Dr. Antony Contras, call for appointment; Dr. Jenna Luo, medical management.     Lauraine Rinne, P.A.   ______________________________ Charlett Blake, M.D.    DA/MEDQ  D:  09/10/2017  T:  09/11/2017  Job:  [id number]  cc:   Charlett Blake, M.D. Pramod P. Leonie Man, MD Marrianne Mood, MD

## 2017-09-13 ENCOUNTER — Encounter: Payer: Self-pay | Admitting: Physical Therapy

## 2017-09-13 ENCOUNTER — Ambulatory Visit: Payer: Medicare Other | Admitting: Occupational Therapy

## 2017-09-13 ENCOUNTER — Ambulatory Visit: Payer: Medicare Other | Attending: Physical Medicine & Rehabilitation | Admitting: Physical Therapy

## 2017-09-13 ENCOUNTER — Ambulatory Visit: Payer: Medicare Other

## 2017-09-13 ENCOUNTER — Telehealth: Payer: Self-pay | Admitting: Registered Nurse

## 2017-09-13 DIAGNOSIS — R2689 Other abnormalities of gait and mobility: Secondary | ICD-10-CM | POA: Diagnosis present

## 2017-09-13 DIAGNOSIS — R4701 Aphasia: Secondary | ICD-10-CM | POA: Insufficient documentation

## 2017-09-13 DIAGNOSIS — R2681 Unsteadiness on feet: Secondary | ICD-10-CM | POA: Insufficient documentation

## 2017-09-13 NOTE — Therapy (Addendum)
[facility] [address] [facility] [place], [place], [address] Phone: [phone]   Fax:  [phone]  Patient Details  Name: Eugene [name] MRN: [id number] Date of Birth: [birth year] Referring Provider:  Charlett Blake, MD  Encounter Date: 09/13/2017     ST Arrived- Cancel (Refusal of ST services)  SLP began evaluation and as SLP began conversing with pt, pt stated he would not return for ST treatment sessions. SLP talked with pt and daughter (who was in room with pt) about research stating that outpatient therapies were key in having pts return to PLOF. Pt stated politely, "In no way -would I come- back- - for more." SLP asked patient about PT as that eval was completed just prior to ST eval, and pt stated that he was not going to schedule any PT either.  SLP inquired re: upcoming OT eval (scheduled today after ST eval). SLP explained basics of OT and pt told SLP he was not interested in returning for any OT either.  Pt is his own medical POA (according to him and to daughter) at this time. Daughter stated, "It's your decision" looking at pt, after SLP inquired of pt's medical POA status.  ST eval and OT evals were cancelled. SLP welcomed pt to return at any time, if desired, and was told all he needed was a prescription from PCP or from physiatrist if he desired outpatient therapies in the future.   [facility] [place] ,[place], [place]  09/13/2017, 9:50 AM  [facility] [address] [place], [place], [address] Phone: [phone]   Fax:  [phone]

## 2017-09-13 NOTE — Telephone Encounter (Signed)
Transitional Care call Transitional Care Call Completed, Appointment Confirmed, Address Confirmed, New Patient Packet Mailed Transitional Care Call Questions answered by daughter: [name]  Patient name: Eugene [name]  DOB: [birth year] 1. Are you/is patient experiencing any problems since coming home? No a. Are there any questions regarding any aspect of care? No 2. Are there any questions regarding medications administration/dosing? No a. Are meds being taken as prescribed? Yes/ except Seroquel and Enalbrex not approved. Our office will perform a PA, she verbalizes understanding.  b. "Patient should review meds with caller to confirm" Medication List Reviewed 3. Have there been any falls? No 4. Has Home Health been to the house and/or have they contacted you? [facility]'s going to [facility]  a. If not, have you tried to contact them? NA b. Can we help you contact them? NA 5. Are bowels and bladder emptying properly? Yes a. Are there any unexpected incontinence issues? No b. If applicable, is patient following bowel/bladder programs? NA 6. Any fevers, problems with breathing, unexpected pain? No 7. Are there any skin problems or new areas of breakdown? No 8. Has the patient/family member arranged specialty MD follow up (ie cardiology/neurology/renal/surgical/etc.)? [name] was instructed to call Dr. Leonie Man office for follow up appointment, she states she has the number.  a. Can we help arrange? No 9. Does the patient need any other services or support that we can help arrange? No 10. Are caregivers following through as expected in assisting the patient? Yes 11. Has the patient quit smoking, drinking alcohol, or using drugs as recommended? [name] reports her father hasn't smoke since hospitalization, also reports her father [name] doesn't drink alcohol or use illicit drugs.  Appointment date/time 09/23/2017, arrival at 1:20 for 1:40 appointment, with Danella Sensing  ANP-C. Herricks

## 2017-09-14 NOTE — Therapy (Signed)
[facility] [address] [place], [place], [address] Phone: [phone]   Fax:  [phone]  Physical Therapy Evaluation  Patient Details  Name: Eugene [name] MRN: [id number] Date of Birth: Apr 28, 1941 Referring Provider: Letta Pate   Encounter Date: 09/13/2017  PT End of Session - 09/14/17 1432    Visit Number  1    Number of Visits  1 per pt's request-he does not want to complete therapy    PT Start Time  0848    PT Stop Time  0928    PT Time Calculation (min)  40 min    Equipment Utilized During Treatment  Gait belt    Activity Tolerance  Patient tolerated treatment well    Behavior During Therapy  [facility] for tasks assessed/performed       Past Medical History:  Diagnosis Date  . Anxiety   . Atrial fibrillation ([place])   . BPH (benign prostatic hypertrophy)   . Cataracts, both eyes   . Coronary artery disease   . Depressed   . Hyperlipidemia   . Hypertension   . ICH (intracerebral hemorrhage) ([place]) 08/2017  . MI (myocardial infarction) ([place])   . OSA (obstructive sleep apnea)   . Pre-diabetes     Past Surgical History:  Procedure Laterality Date  . CARDIAC CATHETERIZATION N/A 06/26/2015   Procedure: Left Heart Cath and Coronary Angiography;  Surgeon: Burnell Blanks, MD;  Location: [facility];  Service: Cardiovascular;  Laterality: N/A;  . CARDIAC CATHETERIZATION  06/26/2015   Procedure: Coronary Stent Intervention;  Surgeon: Burnell Blanks, MD;  Location: [facility];  Service: Cardiovascular;;  . CARDIAC CATHETERIZATION  06/26/2015   Procedure: Coronary Balloon Angioplasty;  Surgeon: Burnell Blanks, MD;  Location: [facility];  Service: Cardiovascular;;  . CORONARY STENT PLACEMENT  10/2004    There were no vitals filed for this visit.   Subjective Assessment - 09/13/17 0853    Subjective  Pt reports he doesn't remember much from the hospital.  He reports  stroke-like symptoms around 08/25/17, with R sided weakness, which has improved a little bit.  Feels his balance is beginning to improve.  Gilford Rile was recommended upon d/c from hospital, but he is not using.  No falls.    Patient is accompained by:  Family member Daughter, Jenny Reichmann    Patient Stated Goals  "To live through today."  Per daughter, to help with balance.    Currently in Pain?  No/denies         [facility] PT Assessment - 09/13/17 0856      Assessment   Medical Diagnosis  CVA    Referring Provider  Kirsteins    Onset Date/Surgical Date  08/25/17      Precautions   Precautions  Fall    Precaution Comments  Avoid driving      Balance Screen   Has the patient fallen in the past 6 months  No    Has the patient had a decrease in activity level because of a fear of falling?   No    Is the patient reluctant to leave their home because of a fear of falling?   No      Home Environment   Living Environment  Private residence    Living Arrangements  Alone    Available Help at Discharge  Family;Available 24 hours/day Son and daughter alternate staying with patient    Type of Home  House  Home Access  Stairs to enter    Entrance Stairs-Number of Steps  3    Entrance Stairs-Rails  Right    Home Layout  One level;Laundry or work area in Production assistant, radio - 2 wheels;Bedside commode;Tub bench      Prior Function   Level of Independence  Independent with basic ADLs;Independent with household mobility without device;Independent with community mobility without device;Independent with homemaking with wheelchair    Leisure  Enjoys working on things      Observation/Other Assessments   Focus on Therapeutic Outcomes (FOTO)   NA-FOTO initiated, but pt declines therapy      Posture/Postural Control   Posture/Postural Control  Postural limitations    Postural Limitations  Rounded Shoulders;Forward head;Increased thoracic kyphosis;Posterior pelvic tilt      ROM / Strength    AROM / PROM / Strength  Strength      Strength   Overall Strength  Within functional limits for tasks performed      Transfers   Transfers  Sit to Stand;Stand to Sit    Sit to Stand  6: Modified independent (Device/Increase time)    Stand to Sit  6: Modified independent (Device/Increase time)      Ambulation/Gait   Ambulation/Gait  Yes    Ambulation/Gait Assistance  5: Supervision    Ambulation Distance (Feet)  80 Feet    Assistive device  None    Gait Pattern  Step-through pattern;Decreased step length - right;Shuffle;Trunk flexed;Narrow base of support;Poor foot clearance - right    Ambulation Surface  Level;Indoor    Gait velocity  16.72 sec = 1.96 ft/sec      Standardized Balance Assessment   Standardized Balance Assessment  Timed Up and Go Test;Berg Balance Test      Berg Balance Test   Sit to Stand  Able to stand  independently using hands    Standing Unsupported  Able to stand safely 2 minutes    Sitting with Back Unsupported but Feet Supported on Floor or Stool  Able to sit safely and securely 2 minutes    Stand to Sit  Controls descent by using hands    Transfers  Able to transfer safely, definite need of hands    Standing Unsupported with Eyes Closed  Able to stand 10 seconds with supervision    Standing Ubsupported with Feet Together  Needs help to attain position but able to stand for 30 seconds with feet together    From Standing, Reach Forward with Outstretched Arm  Can reach forward >12 cm safely (5")    From Standing Position, Pick up Object from Floor  Able to pick up shoe, needs supervision    From Standing Position, Turn to Look Behind Over each Shoulder  Turn sideways only but maintains balance    Turn 360 Degrees  Able to turn 360 degrees safely but slowly    Standing Unsupported, Alternately Place Feet on Step/Stool  Needs assistance to keep from falling or unable to try    Standing Unsupported, One Foot in Front  Able to plae foot ahead of the other  independently and hold 30 seconds    Standing on One Leg  Unable to try or needs assist to prevent fall    Total Score  34    Berg comment:  Scores <45/56 indicate increased fall risk.      Timed Up and Go Test   Normal TUG (seconds)  17.79    TUG Comments  Scores >13.5 sec indicates increased fall risk.                Objective measurements completed on examination: See above findings.                           Plan - 09/14/17 1434    Clinical Impression Statement  Pt is a 77 year old male who presents to OP PT with history of CVA on 08/25/17, with R sided weakness.  Pt presents to OPPT eval with decreased balance, decreased safety and independence with gait, decreased timing/coordination of gait.  He is at fall risk per Berg score of 34/56 and TUG score of 17.79 sec.  He has slowed gait velocity of 1.96 ft/sec.  Pt would benefit from skilled PT to address the above stated deficits; however, after PT eval and discussion with pt/daughter agreeing to PT recommendations, upon going to speech therapy eval, pt requested no further therapies and no additional scheduling for PT.    History and Personal Factors relevant to plan of care:  hx of CVA    Clinical Presentation  Stable    Clinical Presentation due to:  fall risk per Merrilee Jansky and TUG    Clinical Decision Making  Low    PT Frequency  One time visit    PT Next Visit Plan  PT requested to not schedule any further appointments for PT, even though PT was recommended.    Consulted and Agree with Plan of Care  Patient;Family member/caregiver    Family Member Consulted  daughter       Patient will benefit from skilled therapeutic intervention in order to improve the following deficits and impairments:  Abnormal gait, Decreased balance, Difficulty walking, Decreased mobility  Visit Diagnosis: Other abnormalities of gait and mobility  Unsteadiness on feet     Problem List Patient Active Problem List    Diagnosis Date Noted  . AKI (acute kidney injury) ([place])   . Stage 2 chronic kidney disease   . Neurogenic bladder   . 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 Prediabetes   . 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 Hypertensive emergency 08/31/2017  . Sundowning 08/31/2017  . Hx of completed stroke 08/31/2017  . Thalamic hemorrhage ([place])   . Right hemiparesis ([place])   . Chronic anticoagulation   . Dysmetria   . Benign essential HTN   . Hyperlipidemia   . OSA (obstructive sleep apnea)   . 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 ICH (intracerebral hemorrhage) ([facility]) - L thalamic d/t HTN in setting of Xarelto and plavix use 08/25/2017  . NSTEMI (non-ST elevated myocardial infarction) ([place])   . Diabetes mellitus type 2, noninsulin dependent ([place])   . Chest pain 06/24/2015  . Hyperglycemia 06/24/2015  . Pain in the chest   . Atrial fibrillation ([place]) 08/23/2009  . PLANTAR FASCIITIS, LEFT 12/31/2008  . TOBACCO ABUSE 04/10/2008  . REDNESS OR DISCHARGE OF EYE 03/09/2008  . RUQ PAIN 08/22/2007  . NEOPLASM, SKIN, UNCERTAIN BEHAVIOR 16/05/930  . WEIGHT GAIN 03/17/2007  . SLEEP APNEA, OBSTRUCTIVE 09/22/2006  . MYOCARDIAL INFARCTION, HX OF 09/01/2006  . FATIGUE 09/01/2006  . HYPERLIPIDEMIA 06/22/2006  . ANXIETY 06/22/2006  . Depression 06/22/2006  . Hypertension 06/22/2006  . CAD (coronary artery disease) 06/22/2006  . BPH (benign prostatic hyperplasia) 06/22/2006    Halsey Persaud W. 09/14/2017, 2:39 PM  [facility] [address] [address]  [facility] [address] [place], [place], [address] Phone: [phone]   Fax:  [phone]  Name: Eugene [name] MRN: [id number] Date of Birth: [birth year]

## 2017-09-20 ENCOUNTER — Other Ambulatory Visit: Payer: Self-pay

## 2017-09-20 ENCOUNTER — Encounter: Payer: Self-pay | Admitting: Family Medicine

## 2017-09-20 ENCOUNTER — Ambulatory Visit: Payer: Medicare Other | Admitting: Family Medicine

## 2017-09-20 VITALS — BP 110/64 | HR 84 | Temp 98.4°F | Resp 16 | Ht 72.0 in | Wt 190.0 lb

## 2017-09-20 DIAGNOSIS — Z09 Encounter for follow-up examination after completed treatment for conditions other than malignant neoplasm: Secondary | ICD-10-CM | POA: Diagnosis not present

## 2017-09-20 DIAGNOSIS — I610 Nontraumatic intracerebral hemorrhage in hemisphere, subcortical: Secondary | ICD-10-CM | POA: Diagnosis not present

## 2017-09-20 DIAGNOSIS — I651 Occlusion and stenosis of basilar artery: Secondary | ICD-10-CM | POA: Diagnosis not present

## 2017-09-20 DIAGNOSIS — I482 Chronic atrial fibrillation, unspecified: Secondary | ICD-10-CM

## 2017-09-20 DIAGNOSIS — E119 Type 2 diabetes mellitus without complications: Secondary | ICD-10-CM | POA: Diagnosis not present

## 2017-09-20 DIAGNOSIS — I2510 Atherosclerotic heart disease of native coronary artery without angina pectoris: Secondary | ICD-10-CM

## 2017-09-20 DIAGNOSIS — Z8673 Personal history of transient ischemic attack (TIA), and cerebral infarction without residual deficits: Secondary | ICD-10-CM

## 2017-09-20 DIAGNOSIS — I10 Essential (primary) hypertension: Secondary | ICD-10-CM

## 2017-09-20 NOTE — Patient Outreach (Signed)
[facility]) Care Management  09/20/2017  DREXLER MALAND 01-31-1941 [id number]   EMMI- Stroke RED ON EMMI ALERT Day # 6 Date: 09/19/17 Red Alert Reason:  Feeling worse overalll? yes  Outreach attempt # 1 Telephone call to patient for EMMI follow up.  No answer.  HIPAA compliant voice message left.   Plan: RN CM will attempt patient again within 4 business days.    [name], RN, MSN [facility] Care Management Care Management Coordinator Direct Line [phone] Toll Free: [phone]  Fax: [phone]

## 2017-09-20 NOTE — Progress Notes (Signed)
Subjective:    Patient ID: Eugene [name], male    DOB: 09-Jul-1940, 77 y.o.   MRN: [id number]  HPI  Unfortunately, patient was recently admitted to the hospital with symptoms of a right-sided stroke.  CT scan revealed an intracranial hemorrhage in the left thalamic tract.  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 d/t HTN in setting of Xarelto and plavix use Active Problems:   Hypertension   CAD (coronary artery disease)   Atrial fibrillation (HCC)   NSTEMI (non-ST elevated myocardial infarction) ([place])   Chronic anticoagulation   Dysmetria   Benign essential HTN   Hyperlipidemia   OSA (obstructive sleep apnea)   Diabetes mellitus type 2 in nonobese ([place])   Occlusion and stenosis of basilar artery   Hypertensive emergency   Sundowning   Hx of completed stroke      HISTORY OF PRESENT ILLNESS Eugene L Stricklandis an 77 y.o.malewith history of hypertension, hyperlipidemiaandatrial fibrillation forwhich he is on Xarelto. At baseline thepatient is quite independent, drivesa car, was able to take care of all of his activities of daily living. However, per family he may not have been keeping up with his medications in terms of exact doses and schedules, with the patient confirming this. Modified Rankin:Rankin Score=0. Hewent to sleep last night around 10 PM (LKW 08/24/2017 at 2000) and woke around 930 this a.m., notingthat he had decreased sensation along hisright side along with significant dysarthria. For that reason [facility]was [facility], where aCT of hisheadrevealeda left thalamic bleed. Blood pressure was 157/98. Patient is not a good historian and it is unclear what his blood pressures usually are. CT shows a L thalamic ICH. Intracerebral  Hemorrhage (ICH) Score = 0. He was transferred to [facility] for further evaluation and treatment.    HOSPITAL COURSE Mr.Eugene L Stricklandis a 77 y.o.malewith history of afib on Xarelto, CAD/MI on plavix, HLD, HTN, OSA admitted for right sided numbness and slurry speech.   ICH: left thalamic small ICH likely due to HTN in the setting of Xarelto and plavix use s/p reversal with Kcentra  Resultant partial expressive aphasia, right pronator drift  CT head left thalamic ICH with old left cerebellar infarct with calcification  MRILeft thalamic ICH   MRASevere BA stenosis vs. Occlusion with distal constitution  CTA head and neck - stable hematoma, BA occlusion with distal recon, b/l VA stenosis L>R.   Repeat CT hematoma stable  2D EchoEF 55-60%  LDL101  HgbA1c6.4  clopidogrel 75 mg daily and Xarelto (rivaroxaban) dailyprior to admission, now on No antithromboticdue to [place]. Add ASA 81mg  7 days post ICH as neuro stable (09/01/17), and repeat CT in 2-3 weeks, if ICH resolved, add eliquis to ASA 81mg .   Patient counseled to be compliant withhisantithrombotic medications  Ongoing aggressive stroke risk factor management  Therapy recommendations: CIR  Disposition: CIRplanned, however, family decided on SNF when copays discussed and desire for longer rehab time, then they changed their mind back to CIR. Will d/c to CIR today.  Wheezing with SOB, resolved  Mild wheezing and SOB  CXR no pulmonary edema  EKG afib, no ST-T changes  duoneb PRN  Lasix 40mg  once  Leukocytosis - WBC 7.4->10.3->11.7->8.9  Afib on Xarelto  Tele showed persistent AFib  Rate controlled, no RVR  Hold off AC for now.   Will repeat CT in 2-3 weeks, if ICH resolves, start eliquis  CAD/MI  On plavix PTA  Hold off for now due to [place]  ASA 81mg  7 days post ICH if neuro stable (09/01/17)  BA occlusion  Confirmed on CTA and MRA  With distal constitution from  collaterals  Consistent with chronic occlusion  Start ASA on 09/01/17 if neuro stable  BP goal 120-140, avoid low BP  Hypertensive Emergency  BP as high as 171/118  Treated with Cleviprex in hospital  On home norvasc, lisinopril and metoprolol, resumed in hopsital  BP goal < 140 in hospital   Long term BP goal 120-140. Avoid hypotension due to BA occlusion, avoid hypertension due to anticoagulation use.  Need BP check closely at home  Hyperlipidemia  Home meds: crestor 20   LDL 101, goal < 70  Now on crestor 20  Continue statin at discharge  Diabetes type II  On metformin at home  HGBA1c 6.4  SSI in hosp  Glucoses stable, WNL not on metformin here  Resume metformin per rehab  Sundowning  May have underlying cognitive impairment  on Seroquel 25 nightly  Improved since on seroquel  Other Stroke Risk Factors  Hx stroke/TIA- imaging showed old left cerebellar infarct with calcification  Obstructive sleep apnea    DISCHARGE PLAN  Disposition:  Inpatient rehab for ongoing PT, OT and ST  Repeat CT head in 2-3 weeks, if hemorrhage resolved, add Eliquis to aspirin 81 for secondary stroke prevention  Ongoing risk factor control by Primary Care Physician at time of discharge  Follow-up Susy Frizzle, MD in 2 weeks.  Follow-up in [facility] in 4 weeks, office to schedule an appointment   09/20/17 A head CT was performed on April 26 which revealed the expected evolution of his left thalamic intracranial hemorrhage.  At that time neurology cleared the patient to resume Eliquis in addition to his aspirin.  He was discharged from inpatient rehab on April 27.  He is here today for follow-up.  Prior to today, I have not seen the patient since 2017.  At that time he had suffered a non-ST elevation myocardial infarction.  This occurred while the patient was taking aspirin in addition to Pradaxa.  At that time  cardiology recommended to continue the patient on dual antiplatelet therapy in addition to his Pradaxa for 1 month then discontinue his aspirin with the plan of continuing his Plavix and Pradaxa indefinitely.  Ultimately that is how the patient was started on Plavix in addition to antithrombotic therapy although prior to his hospitalization he had been switched to Xarelto.  Patient is here today with his son who he is living with.  He is interested in resuming driving.  I walked with the patient around our clinic today.  He is dragging his right foot slightly.  He also demonstrates some right-sided neglect as he walks.  Several times he almost tripped over boxes or other items throughout the clinic near his right leg.  He also seems to have slowed reaction time.  Given these findings, I have recommended against resuming driving at the present time.  Otherwise he is doing very well.  He is not been taking Seroquel which they were giving to him in the hospital for sundowning.  His son states that he does not feel that his father needs that now that he is back at home.  He is much less confused.  He  is not demonstrating any evidence of delirium.  Furthermore he has not been taking the Enablex.  I explained to the patient that this is an anticholinergic medication.  This increases his risk for delirium and confusion as it does cross the blood-brain barrier potentially.  He states without the medication, he experiences frequency about every 3-4 hours at night.  Patient states that he can live with that at the present time.  Unfortunately he is smoking.  He has no desire to quit at the present time.  His blood pressure today is extremely low for this patient given the complexity of his medical problems.  He denies any dizziness or lightheadedness or syncope.  He is off his metformin which he was taking prior to his hospital admission.  His hemoglobin A1c on admission was 6.4 indicating excellent control.  However he is  no longer on any hypoglycemic agent.  He is also now taking Prozac 20 mg a day for depression.  However neither the son nor the father state that they feel that he needs this medication.  He is not certain why he is taking it.  This is something that was started during the hospitalization Past Medical History:  Diagnosis Date  . Anxiety   . Atrial fibrillation ([place])   . BPH (benign prostatic hypertrophy)   . Cataracts, both eyes   . Coronary artery disease   . Depressed   . Hyperlipidemia   . Hypertension   . ICH (intracerebral hemorrhage) ([place]) 08/2017  . MI (myocardial infarction) ([place])   . OSA (obstructive sleep apnea)   . Pre-diabetes    Past Surgical History:  Procedure Laterality Date  . CARDIAC CATHETERIZATION N/A 06/26/2015   Procedure: Left Heart Cath and Coronary Angiography;  Surgeon: Burnell Blanks, MD;  Location: [place] [facility];  Service: Cardiovascular;  Laterality: N/A;  . CARDIAC CATHETERIZATION  06/26/2015   Procedure: Coronary Stent Intervention;  Surgeon: Burnell Blanks, MD;  Location: [place] [facility];  Service: Cardiovascular;;  . CARDIAC CATHETERIZATION  06/26/2015   Procedure: Coronary Balloon Angioplasty;  Surgeon: Burnell Blanks, MD;  Location: [place] [facility];  Service: Cardiovascular;;  . CORONARY STENT PLACEMENT  10/2004   Current Outpatient Medications on File Prior to Visit  Medication Sig Dispense Refill  . amLODipine (NORVASC) 10 MG tablet Take 1 tablet (10 mg total) by mouth daily. 30 tablet 0  . apixaban (ELIQUIS) 5 MG TABS tablet Take 1 tablet (5 mg total) by mouth 2 (two) times daily. 60 tablet 0  . aspirin EC 81 MG EC tablet Take 1 tablet (81 mg total) by mouth daily.    . finasteride (PROSCAR) 5 MG tablet TAKE 1 BY MOUTH DAILY 30 tablet 0  . hydrochlorothiazide (MICROZIDE) 12.5 MG capsule Take 1 capsule (12.5 mg total) by mouth daily. 30 capsule 0  . lisinopril (PRINIVIL,ZESTRIL) 20 MG tablet Take 1 tablet (20 mg total)  by mouth daily. 30 tablet 0  . pantoprazole (PROTONIX) 40 MG tablet Take 1 tablet (40 mg total) by mouth daily. 30 tablet 0  . PARoxetine (PAXIL) 20 MG tablet Take 1 tablet (20 mg total) by mouth daily. 30 tablet 0  . rosuvastatin (CRESTOR) 20 MG tablet Take 1 tablet (20 mg total) by mouth daily. 30 tablet 0  . QUEtiapine (SEROQUEL) 25 MG tablet Take 1 tablet (25 mg total) by mouth at bedtime. (Patient not taking: Reported on 09/20/2017) 30 tablet 0   No current facility-administered medications on file prior  to visit.    No Known Allergies Social History   Socioeconomic History  . Marital status: Divorced    Spouse name: Not on file  . Number of children: Not on file  . Years of education: Not on file  . Highest education level: Not on file  Occupational History  . Occupation: Retired Civil engineer, contracting for [employer]  . Financial resource strain: Not on file  . Food insecurity:    Worry: Not on file    Inability: Not on file  . Transportation needs:    Medical: Not on file    Non-medical: Not on file  Tobacco Use  . Smoking status: Current Every Day Smoker    Packs/day: 1.00    Years: 50.00    Pack years: 50.00  . Smokeless tobacco: Never Used  Substance and Sexual Activity  . Alcohol use: No  . Drug use: No  . Sexual activity: Not Currently  Lifestyle  . Physical activity:    Days per week: Patient refused    Minutes per session: Patient refused  . Stress: Not on file  Relationships  . Social connections:    Talks on phone: Patient refused    Gets together: Patient refused    Attends religious service: Patient refused    Active member of club or organization: Patient refused    Attends meetings of clubs or organizations: Patient refused    Relationship status: Patient refused  . Intimate partner violence:    Fear of current or ex partner: Not on file    Emotionally abused: Not on file    Physically abused: Not on file    Forced sexual activity: Not on  file  Other Topics Concern  . Not on file  Social History Narrative  . Not on file       Review of Systems  All other systems reviewed and are negative.      Objective:   Physical Exam  Constitutional: He is oriented to person, place, and time. He appears well-developed and well-nourished. No distress.  Neck: No JVD present.  Cardiovascular: Normal rate and normal heart sounds. An irregularly irregular rhythm present.  Pulmonary/Chest: Effort normal. No stridor. No respiratory distress. He has no wheezes.  Abdominal: Soft. Bowel sounds are normal.  Musculoskeletal: He exhibits no edema.  Neurological: He is alert and oriented to person, place, and time. He displays normal reflexes. No cranial nerve deficit or sensory deficit. He exhibits abnormal muscle tone. Coordination abnormal.  Skin: He is not diaphoretic.  Vitals reviewed.   Patient has mild right-sided neglect.  He has a mild shuffling gait.  He is slightly dragging his right foot when he walks.  Otherwise he is doing remarkably well given his recent history      Assessment & Plan:  Hospital discharge follow-up - Plan: CBC with Differential/Platelet, COMPLETE METABOLIC PANEL WITH GFR  Thalamic hemorrhage ([place]) - Plan: CBC with Differential/Platelet, COMPLETE METABOLIC PANEL WITH GFR  Hx of completed stroke - Plan: CBC with Differential/Platelet, COMPLETE METABOLIC PANEL WITH GFR  Occlusion and stenosis of basilar artery - Plan: CBC with Differential/Platelet, COMPLETE METABOLIC PANEL WITH GFR  Chronic atrial fibrillation ([place]) - Plan: CBC with Differential/Platelet, COMPLETE METABOLIC PANEL WITH GFR  ASCVD (arteriosclerotic cardiovascular disease) - Plan: CBC with Differential/Platelet, COMPLETE METABOLIC PANEL WITH GFR  Diabetes mellitus type II, non insulin dependent ([place]) - Plan: CBC with Differential/Platelet, COMPLETE METABOLIC PANEL WITH GFR  Benign essential HTN - Plan: CBC with Differential/Platelet,  COMPLETE METABOLIC PANEL WITH GFR  I have recommended avoiding Enablex due to polypharmacy and its anticholinergic effects.  Patient will discontinue Seroquel as he has not been taking it for more than a week and does not seem to use the medication or require it.  I have recommended decreasing Prozac to 10 mg a day and reassessing in 2 to 3 weeks.  If no significant changes seen, I would discontinue the medicine altogether.  His blood pressure today is low.  Ideally I like to keep the patient between 938 and 182 systolic over 99-37 diastolic.  Patient will buy a blood pressure monitor and check his blood pressure at home.  If consistently low, I will decrease his antihypertensive medication and then recheck his blood pressure in 2 to 3 weeks.  I explained to the patient why he needs to stay on aspirin due to his history of stroke as well as his history of non-ST elevation myocardial infarction in addition to his antithrombotic therapy and the version of Eliquis given his atrial fibrillation.  However I also explained that we need to maintain strict control of his blood pressure to avoid hypertensive urgency.  Also explained the need we need to avoid hypotension given his basilar artery stenosis.  Reassess the patient in 3 weeks

## 2017-09-21 ENCOUNTER — Encounter: Payer: Medicare Other | Admitting: Registered Nurse

## 2017-09-21 LAB — CBC WITH DIFFERENTIAL/PLATELET
BASOS ABS: 20 {cells}/uL (ref 0–200)
Basophils Relative: 0.2 %
EOS PCT: 2.6 %
Eosinophils Absolute: 265 cells/uL (ref 15–500)
HEMATOCRIT: 44.7 % (ref 38.5–50.0)
Hemoglobin: 15.6 g/dL (ref 13.2–17.1)
LYMPHS ABS: 2734 {cells}/uL (ref 850–3900)
MCH: 30.3 pg (ref 27.0–33.0)
MCHC: 34.9 g/dL (ref 32.0–36.0)
MCV: 86.8 fL (ref 80.0–100.0)
MONOS PCT: 10.6 %
MPV: 10.8 fL (ref 7.5–12.5)
NEUTROS ABS: 6100 {cells}/uL (ref 1500–7800)
NEUTROS PCT: 59.8 %
PLATELETS: 224 10*3/uL (ref 140–400)
RBC: 5.15 10*6/uL (ref 4.20–5.80)
RDW: 13.8 % (ref 11.0–15.0)
Total Lymphocyte: 26.8 %
WBC mixed population: 1081 cells/uL — ABNORMAL HIGH (ref 200–950)
WBC: 10.2 10*3/uL (ref 3.8–10.8)

## 2017-09-21 LAB — COMPLETE METABOLIC PANEL WITH GFR
AG Ratio: 1.7 (calc) (ref 1.0–2.5)
ALKALINE PHOSPHATASE (APISO): 60 U/L (ref 40–115)
ALT: 14 U/L (ref 9–46)
AST: 12 U/L (ref 10–35)
Albumin: 4 g/dL (ref 3.6–5.1)
BUN/Creatinine Ratio: 18 (calc) (ref 6–22)
BUN: 25 mg/dL (ref 7–25)
CALCIUM: 9.2 mg/dL (ref 8.6–10.3)
CO2: 28 mmol/L (ref 20–32)
CREATININE: 1.39 mg/dL — AB (ref 0.70–1.18)
Chloride: 106 mmol/L (ref 98–110)
GFR, EST NON AFRICAN AMERICAN: 48 mL/min/{1.73_m2} — AB (ref >=60)
GFR, Est African American: 56 mL/min/{1.73_m2} — ABNORMAL LOW (ref >=60)
GLOBULIN: 2.4 g/dL (ref 1.9–3.7)
GLUCOSE: 117 mg/dL — AB (ref 65–99)
Potassium: 4.9 mmol/L (ref 3.5–5.3)
SODIUM: 142 mmol/L (ref 135–146)
Total Bilirubin: 0.4 mg/dL (ref 0.2–1.2)
Total Protein: 6.4 g/dL (ref 6.1–8.1)

## 2017-09-22 ENCOUNTER — Other Ambulatory Visit: Payer: Self-pay | Admitting: Family Medicine

## 2017-09-23 ENCOUNTER — Encounter: Payer: Medicare Other | Admitting: Registered Nurse

## 2017-09-23 ENCOUNTER — Other Ambulatory Visit: Payer: Self-pay

## 2017-09-23 ENCOUNTER — Telehealth: Payer: Self-pay | Admitting: Family Medicine

## 2017-09-23 NOTE — Telephone Encounter (Signed)
Pt's son called with BP readings : 138/76,144/92,117/81,148/77,125/83,126/83

## 2017-09-23 NOTE — Patient Outreach (Signed)
[facility] [facility]) Care Management  09/23/2017  ROOSEVELT EIMERS [birth year] [id number]   EMMI-Stroke RED ON EMMI ALERT Day #6 Date:09/19/17 Red Alert Reason:Feeling worse overall? Yes   Incoming call from patient.  He is able to verify HIPAA.  Patient reports he is doing good.  Addressed red alert.  Patient reports that he cannot remember not feeling good.  Advised patient that sometimes the system does not record the correct response.  He verbalized understanding and adds that he gets lot of therapy and is doing well. Patient voices no questions or concerns.    Plan: RN CM will close case at this time.   [name], RN, MSN [facility] Care Management Care Management Coordinator Direct Line [phone] Toll Free: [phone]  Fax: [phone]

## 2017-09-23 NOTE — Patient Outreach (Signed)
[place] [facility]) Care Management  09/23/2017  Eugene [name] 06/20/1940 [id number]   EMMI- Stroke RED ON EMMI ALERT Day # 6 Date:09/19/17 Red Alert Reason: Feeling worse overalll? yes  Outreach attempt # 2 Telephone call to patient for EMMI follow up.  No answer.  HIPAA compliant voice message left.   Plan: RN CM will attempt patient again within 4 business days.    [name], RN, MSN [facility] Care Management Care Management Coordinator Direct Line [phone] Toll Free: [phone]  Fax: [phone]

## 2017-09-24 ENCOUNTER — Ambulatory Visit: Payer: Medicare Other | Admitting: Family Medicine

## 2017-09-24 VITALS — BP 124/80

## 2017-09-24 DIAGNOSIS — I10 Essential (primary) hypertension: Secondary | ICD-10-CM

## 2017-09-24 NOTE — Progress Notes (Signed)
Pt came in to check his BP machine because he was getting some higher then normal readings compared to what we were getting in OV.  BP's are as follows:  Left arm manuel: 124/80 Machine: 163/81 Right arm manuel: 134/70 Machine: 132/79  Checked bp in left arm again with pt holding his arm parallel and it was 144/76 which is better then 1st reading.  Informed pt to hold arm parallel when doing BP and to check no more then twice in a row - if still getting high readings at home to call us.

## 2017-09-24 NOTE — Telephone Encounter (Signed)
Those look good for now.

## 2017-10-02 ENCOUNTER — Other Ambulatory Visit: Payer: Self-pay | Admitting: Physical Medicine and Rehabilitation

## 2017-10-04 ENCOUNTER — Inpatient Hospital Stay (HOSPITAL_COMMUNITY)
Admission: EM | Admit: 2017-10-04 | Discharge: 2017-10-10 | DRG: 065 | Disposition: A | Discharge status: Home / Self Care | Payer: Medicare Other | Attending: Internal Medicine | Admitting: Internal Medicine

## 2017-10-04 ENCOUNTER — Ambulatory Visit: Payer: Medicare Other | Admitting: Family Medicine

## 2017-10-04 ENCOUNTER — Other Ambulatory Visit: Payer: Self-pay

## 2017-10-04 ENCOUNTER — Emergency Department (HOSPITAL_COMMUNITY): Payer: Medicare Other

## 2017-10-04 ENCOUNTER — Encounter (HOSPITAL_COMMUNITY): Payer: Self-pay | Admitting: *Deleted

## 2017-10-04 DIAGNOSIS — R270 Ataxia, unspecified: Secondary | ICD-10-CM

## 2017-10-04 DIAGNOSIS — I2510 Atherosclerotic heart disease of native coronary artery without angina pectoris: Secondary | ICD-10-CM | POA: Diagnosis present

## 2017-10-04 DIAGNOSIS — F329 Major depressive disorder, single episode, unspecified: Secondary | ICD-10-CM | POA: Diagnosis present

## 2017-10-04 DIAGNOSIS — Z66 Do not resuscitate: Secondary | ICD-10-CM | POA: Diagnosis not present

## 2017-10-04 DIAGNOSIS — R45851 Suicidal ideations: Secondary | ICD-10-CM | POA: Diagnosis not present

## 2017-10-04 DIAGNOSIS — I610 Nontraumatic intracerebral hemorrhage in hemisphere, subcortical: Secondary | ICD-10-CM

## 2017-10-04 DIAGNOSIS — I482 Chronic atrial fibrillation: Secondary | ICD-10-CM | POA: Diagnosis not present

## 2017-10-04 DIAGNOSIS — I129 Hypertensive chronic kidney disease with stage 1 through stage 4 chronic kidney disease, or unspecified chronic kidney disease: Secondary | ICD-10-CM | POA: Diagnosis present

## 2017-10-04 DIAGNOSIS — G450 Vertebro-basilar artery syndrome: Secondary | ICD-10-CM | POA: Diagnosis present

## 2017-10-04 DIAGNOSIS — E785 Hyperlipidemia, unspecified: Secondary | ICD-10-CM

## 2017-10-04 DIAGNOSIS — E1122 Type 2 diabetes mellitus with diabetic chronic kidney disease: Secondary | ICD-10-CM | POA: Diagnosis present

## 2017-10-04 DIAGNOSIS — N182 Chronic kidney disease, stage 2 (mild): Secondary | ICD-10-CM | POA: Diagnosis present

## 2017-10-04 DIAGNOSIS — N400 Enlarged prostate without lower urinary tract symptoms: Secondary | ICD-10-CM | POA: Diagnosis not present

## 2017-10-04 DIAGNOSIS — N179 Acute kidney failure, unspecified: Secondary | ICD-10-CM | POA: Diagnosis present

## 2017-10-04 DIAGNOSIS — F322 Major depressive disorder, single episode, severe without psychotic features: Secondary | ICD-10-CM

## 2017-10-04 DIAGNOSIS — G8191 Hemiplegia, unspecified affecting right dominant side: Secondary | ICD-10-CM | POA: Diagnosis present

## 2017-10-04 DIAGNOSIS — I63522 Cerebral infarction due to unspecified occlusion or stenosis of left anterior cerebral artery: Secondary | ICD-10-CM | POA: Diagnosis not present

## 2017-10-04 DIAGNOSIS — Z7189 Other specified counseling: Secondary | ICD-10-CM | POA: Diagnosis not present

## 2017-10-04 DIAGNOSIS — R29701 NIHSS score 1: Secondary | ICD-10-CM | POA: Diagnosis not present

## 2017-10-04 DIAGNOSIS — Z955 Presence of coronary angioplasty implant and graft: Secondary | ICD-10-CM

## 2017-10-04 DIAGNOSIS — I651 Occlusion and stenosis of basilar artery: Secondary | ICD-10-CM | POA: Diagnosis present

## 2017-10-04 DIAGNOSIS — G4733 Obstructive sleep apnea (adult) (pediatric): Secondary | ICD-10-CM | POA: Diagnosis present

## 2017-10-04 DIAGNOSIS — F17210 Nicotine dependence, cigarettes, uncomplicated: Secondary | ICD-10-CM | POA: Diagnosis present

## 2017-10-04 DIAGNOSIS — H9192 Unspecified hearing loss, left ear: Secondary | ICD-10-CM | POA: Diagnosis present

## 2017-10-04 DIAGNOSIS — F419 Anxiety disorder, unspecified: Secondary | ICD-10-CM | POA: Diagnosis present

## 2017-10-04 DIAGNOSIS — Z515 Encounter for palliative care: Secondary | ICD-10-CM | POA: Diagnosis present

## 2017-10-04 DIAGNOSIS — E119 Type 2 diabetes mellitus without complications: Secondary | ICD-10-CM

## 2017-10-04 DIAGNOSIS — H6123 Impacted cerumen, bilateral: Secondary | ICD-10-CM | POA: Diagnosis not present

## 2017-10-04 DIAGNOSIS — R471 Dysarthria and anarthria: Secondary | ICD-10-CM | POA: Diagnosis present

## 2017-10-04 DIAGNOSIS — I252 Old myocardial infarction: Secondary | ICD-10-CM

## 2017-10-04 DIAGNOSIS — Z79899 Other long term (current) drug therapy: Secondary | ICD-10-CM

## 2017-10-04 DIAGNOSIS — R2689 Other abnormalities of gait and mobility: Secondary | ICD-10-CM | POA: Diagnosis not present

## 2017-10-04 DIAGNOSIS — Z8673 Personal history of transient ischemic attack (TIA), and cerebral infarction without residual deficits: Secondary | ICD-10-CM | POA: Diagnosis not present

## 2017-10-04 DIAGNOSIS — T1491XA Suicide attempt, initial encounter: Secondary | ICD-10-CM | POA: Diagnosis present

## 2017-10-04 DIAGNOSIS — I4891 Unspecified atrial fibrillation: Secondary | ICD-10-CM | POA: Diagnosis present

## 2017-10-04 DIAGNOSIS — I480 Paroxysmal atrial fibrillation: Secondary | ICD-10-CM | POA: Diagnosis not present

## 2017-10-04 DIAGNOSIS — R42 Dizziness and giddiness: Secondary | ICD-10-CM | POA: Diagnosis not present

## 2017-10-04 DIAGNOSIS — E1151 Type 2 diabetes mellitus with diabetic peripheral angiopathy without gangrene: Secondary | ICD-10-CM | POA: Diagnosis not present

## 2017-10-04 DIAGNOSIS — I639 Cerebral infarction, unspecified: Secondary | ICD-10-CM | POA: Diagnosis not present

## 2017-10-04 DIAGNOSIS — F32A Depression, unspecified: Secondary | ICD-10-CM | POA: Diagnosis present

## 2017-10-04 DIAGNOSIS — Z7982 Long term (current) use of aspirin: Secondary | ICD-10-CM

## 2017-10-04 DIAGNOSIS — I63529 Cerebral infarction due to unspecified occlusion or stenosis of unspecified anterior cerebral artery: Secondary | ICD-10-CM | POA: Diagnosis not present

## 2017-10-04 DIAGNOSIS — R4182 Altered mental status, unspecified: Secondary | ICD-10-CM | POA: Diagnosis not present

## 2017-10-04 DIAGNOSIS — Z7901 Long term (current) use of anticoagulants: Secondary | ICD-10-CM

## 2017-10-04 LAB — DIFFERENTIAL
BASOS ABS: 0 10*3/uL (ref 0.0–0.1)
BASOS PCT: 0 %
Eosinophils Absolute: 0 10*3/uL (ref 0.0–0.7)
Eosinophils Relative: 0 %
LYMPHS PCT: 7 %
Lymphs Abs: 1.2 10*3/uL (ref 0.7–4.0)
MONOS PCT: 7 %
Monocytes Absolute: 1.2 10*3/uL (ref 0.1–1.0)
NEUTROS ABS: 15.8 10*3/uL (ref 1.7–7.7)
Neutrophils Relative %: 86 %

## 2017-10-04 LAB — URINALYSIS, ROUTINE W REFLEX MICROSCOPIC
Bacteria, UA: NONE SEEN
Bilirubin Urine: NEGATIVE
GLUCOSE, UA: NEGATIVE mg/dL
Hgb urine dipstick: NEGATIVE
KETONES UR: 20 mg/dL — AB
LEUKOCYTES UA: NEGATIVE
NITRITE: NEGATIVE
PH: 6 (ref 5.0–8.0)
Protein, ur: 30 mg/dL — AB
Specific Gravity, Urine: 1.02 (ref 1.005–1.030)

## 2017-10-04 LAB — PHOSPHORUS: Phosphorus: 3.2 mg/dL (ref 2.5–4.6)

## 2017-10-04 LAB — COMPREHENSIVE METABOLIC PANEL
ALT: 17 U/L (ref 17–63)
AST: 16 U/L (ref 15–41)
Albumin: 3.9 g/dL (ref 3.5–5.0)
Alkaline Phosphatase: 63 U/L (ref 38–126)
Anion gap: 10 (ref 5–15)
BUN: 24 mg/dL — ABNORMAL HIGH (ref 6–20)
CHLORIDE: 101 mmol/L (ref 101–111)
CO2: 28 mmol/L (ref 22–32)
Calcium: 9.6 mg/dL (ref 8.9–10.3)
Creatinine, Ser: 1.17 mg/dL (ref 0.61–1.24)
GFR, EST NON AFRICAN AMERICAN: 58 mL/min — AB (ref >60)
Glucose, Bld: 139 mg/dL — ABNORMAL HIGH (ref 65–99)
POTASSIUM: 4.1 mmol/L (ref 3.5–5.1)
SODIUM: 139 mmol/L (ref 135–145)
Total Bilirubin: 0.8 mg/dL (ref 0.3–1.2)
Total Protein: 7.1 g/dL (ref 6.5–8.1)

## 2017-10-04 LAB — RAPID URINE DRUG SCREEN, HOSP PERFORMED
AMPHETAMINES: NOT DETECTED
BENZODIAZEPINES: NOT DETECTED
Barbiturates: NOT DETECTED
COCAINE: NOT DETECTED
OPIATES: NOT DETECTED
Tetrahydrocannabinol: NOT DETECTED

## 2017-10-04 LAB — CBC
HCT: 46.7 % (ref 39.0–52.0)
Hemoglobin: 15.2 g/dL (ref 13.0–17.0)
MCH: 29.9 pg (ref 26.0–34.0)
MCHC: 32.5 g/dL (ref 30.0–36.0)
MCV: 91.9 fL (ref 78.0–100.0)
Platelets: 192 10*3/uL (ref 150–400)
RBC: 5.08 MIL/uL (ref 4.22–5.81)
RDW: 14.4 % (ref 11.5–15.5)
WBC: 17.9 10*3/uL — AB (ref 4.0–10.5)

## 2017-10-04 LAB — APTT: aPTT: 32 seconds (ref 24–36)

## 2017-10-04 LAB — ETHANOL: Alcohol, Ethyl (B): <10 mg/dL (ref <10)

## 2017-10-04 LAB — MAGNESIUM: Magnesium: 1.9 mg/dL (ref 1.7–2.4)

## 2017-10-04 LAB — I-STAT TROPONIN, ED: Troponin i, poc: 0.07 ng/mL (ref 0.00–0.08)

## 2017-10-04 LAB — PROTIME-INR
INR: 1.2
Prothrombin Time: 15.1 seconds (ref 11.4–15.2)

## 2017-10-04 LAB — ACETAMINOPHEN LEVEL

## 2017-10-04 LAB — TROPONIN I
Troponin I: 0.12 ng/mL (ref <0.03)
Troponin I: 0.18 ng/mL (ref <0.03)

## 2017-10-04 LAB — SALICYLATE LEVEL

## 2017-10-04 MED ORDER — APIXABAN 5 MG PO TABS
5.0000 mg | ORAL_TABLET | Freq: Two times a day (BID) | ORAL | Status: DC
  Administered 2017-10-04: 5 mg via ORAL
  Filled 2017-10-04 (×5): qty 1

## 2017-10-04 MED ORDER — FINASTERIDE 5 MG PO TABS
5.0000 mg | ORAL_TABLET | Freq: Every day | ORAL | Status: DC
  Administered 2017-10-04 – 2017-10-09 (×6): 5 mg via ORAL
  Filled 2017-10-04 (×8): qty 1

## 2017-10-04 MED ORDER — SODIUM CHLORIDE 0.9 % IV SOLN
INTRAVENOUS | Status: AC
  Administered 2017-10-04: 19:00:00 via INTRAVENOUS

## 2017-10-04 MED ORDER — ACETAMINOPHEN 650 MG RE SUPP: 650.0000 mg | Freq: Four times a day (QID) | RECTAL | Status: DC | PRN

## 2017-10-04 MED ORDER — LISINOPRIL 10 MG PO TABS
20.0000 mg | ORAL_TABLET | Freq: Every day | ORAL | Status: DC
  Administered 2017-10-04 – 2017-10-09 (×6): 20 mg via ORAL
  Filled 2017-10-04 (×6): qty 2

## 2017-10-04 MED ORDER — MECLIZINE HCL 12.5 MG PO TABS
12.5000 mg | ORAL_TABLET | Freq: Three times a day (TID) | ORAL | Status: DC | PRN
  Administered 2017-10-05: 12.5 mg via ORAL
  Filled 2017-10-04: qty 1

## 2017-10-04 MED ORDER — ACETAMINOPHEN 325 MG PO TABS
650.0000 mg | ORAL_TABLET | Freq: Four times a day (QID) | ORAL | Status: DC | PRN
  Administered 2017-10-06 – 2017-10-07 (×3): 650 mg via ORAL
  Filled 2017-10-04 (×4): qty 2

## 2017-10-04 MED ORDER — PANTOPRAZOLE SODIUM 40 MG PO TBEC
40.0000 mg | DELAYED_RELEASE_TABLET | Freq: Every day | ORAL | Status: DC
  Administered 2017-10-04 – 2017-10-09 (×6): 40 mg via ORAL
  Filled 2017-10-04 (×6): qty 1

## 2017-10-04 MED ORDER — SODIUM CHLORIDE 0.9 % IR SOLN: Freq: Once | Status: DC

## 2017-10-04 MED ORDER — HYDROGEN PEROXIDE 3 % EX SOLN
CUTANEOUS | Status: AC
  Administered 2017-10-04: 1
  Filled 2017-10-04: qty 473

## 2017-10-04 MED ORDER — ONDANSETRON HCL 4 MG/2ML IJ SOLN: 4.0000 mg | Freq: Four times a day (QID) | INTRAMUSCULAR | Status: DC | PRN

## 2017-10-04 MED ORDER — HYDROCHLOROTHIAZIDE 12.5 MG PO CAPS
12.5000 mg | ORAL_CAPSULE | Freq: Every day | ORAL | Status: DC
  Administered 2017-10-04 – 2017-10-09 (×6): 12.5 mg via ORAL
  Filled 2017-10-04 (×8): qty 1

## 2017-10-04 MED ORDER — ADULT MULTIVITAMIN W/MINERALS CH
1.0000 | ORAL_TABLET | Freq: Every day | ORAL | Status: DC
  Administered 2017-10-04 – 2017-10-10 (×7): 1 via ORAL
  Filled 2017-10-04 (×7): qty 1

## 2017-10-04 MED ORDER — ASPIRIN EC 81 MG PO TBEC
81.0000 mg | DELAYED_RELEASE_TABLET | Freq: Every day | ORAL | Status: DC
  Administered 2017-10-04 – 2017-10-05 (×2): 81 mg via ORAL
  Filled 2017-10-04 (×2): qty 1

## 2017-10-04 MED ORDER — AMLODIPINE BESYLATE 5 MG PO TABS
10.0000 mg | ORAL_TABLET | Freq: Every day | ORAL | Status: DC
  Administered 2017-10-04 – 2017-10-10 (×7): 10 mg via ORAL
  Filled 2017-10-04 (×7): qty 2

## 2017-10-04 MED ORDER — PAROXETINE HCL 20 MG PO TABS
20.0000 mg | ORAL_TABLET | Freq: Every day | ORAL | Status: DC
  Administered 2017-10-04: 20 mg via ORAL
  Filled 2017-10-04 (×3): qty 1

## 2017-10-04 MED ORDER — ONDANSETRON HCL 4 MG PO TABS: 4.0000 mg | ORAL_TABLET | Freq: Four times a day (QID) | ORAL | Status: DC | PRN

## 2017-10-04 MED ORDER — ROSUVASTATIN CALCIUM 20 MG PO TABS
20.0000 mg | ORAL_TABLET | Freq: Every day | ORAL | Status: DC
  Administered 2017-10-04 – 2017-10-10 (×7): 20 mg via ORAL
  Filled 2017-10-04 (×9): qty 1

## 2017-10-04 MED ORDER — NICOTINE 14 MG/24HR TD PT24
14.0000 mg | MEDICATED_PATCH | Freq: Every day | TRANSDERMAL | Status: DC
  Administered 2017-10-04 – 2017-10-10 (×6): 14 mg via TRANSDERMAL
  Filled 2017-10-04 (×6): qty 1

## 2017-10-04 MED ORDER — FLUOXETINE HCL 20 MG PO CAPS
20.0000 mg | ORAL_CAPSULE | Freq: Every day | ORAL | Status: DC
  Administered 2017-10-05 – 2017-10-10 (×6): 20 mg via ORAL
  Filled 2017-10-04 (×6): qty 1

## 2017-10-04 NOTE — ED Provider Notes (Signed)
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  Not apparent on MRI, but small brainstem strokes can be missed on MRI.  No neurointervention is possible in this case. 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  Recommendations:  This is a very difficult situation.  He needs permissive hypertension to augment flow given basilar insufficiency, but just had a probably hypertensive hemorrhage last month.  I would be reluctant to resume anticoagulation at this time given recent hemorrhage, although given CT appearance, I think safe at this time to resume ASA, although dual antiplatelet therapy may be risky.  Very high risk for recurrent stroke.  =>  Allow permissive HTN range systolic 989-211.   =>  Resume ASA 81 mg. =>  High-dose statin. =>  PT.  ---------------------------------------------------------------------  History: 77 yo man with h/o HTN, A Fib previously on anticoagulation, L thalamic ICH (presumed hypertensive) last month with good recovery.  Have been holding antiplatelet/anticoagulant therapy.  Evaluatioon at that time showed a L thalamic ICH, advanced chronic small vessel disease, and vascular imaging showed chronic basilar artery occlusion with distal reconstitution.  Now patient reports that since yesterday he has had hearing loss left ear, mild tinnitus, vertigo/dizziness, significant gait imbalance, and subjective leg weakness.  Repeat MRI today showed no ischemia, MRA again shows basilar artery critical stenosis/occlusion.     Patient assumed from Dr. Wyvonnia Dusky at shift change.  MRI shows evolution of previous right thalamic hemorrhage.  No new changes noted.  Patient has markedly ataxic wide-based gait and reports left ear hearing loss.  Initiate  neurological consultation from Dr. Graylon Good.  Patient has difficult situation with hypertensive hemorrhage that would normally require tight blood pressure control.  However has basilar insufficiency that would require permissive hypertension.  Ischemic disease that would require dual antiplatelet therapy.  But also A. fib requiring Eliquis, and recent bleed.  Recommendations as above.  Patient is too ataxic to go home or to acute psychiatric facility.  Will need at minimum PT and OT evaluations to describe level of care patient would be safe at.   Tanna Furry, MD 10/04/17 1149

## 2017-10-04 NOTE — ED Notes (Signed)
Contact Pts Emergency Contact Rick (Pts Son) if any medical changes occur for the Pt.

## 2017-10-04 NOTE — ED Notes (Signed)
Pt belongings placed in locker and secured. Pt belongings include: jeans, with suspenders, white t-shirt, shoes, lighter, and wooden walking cane.

## 2017-10-04 NOTE — ED Notes (Signed)
Pt returned from MRI covered in vomit.  States he threw up during procedure.

## 2017-10-04 NOTE — ED Notes (Signed)
ED Provider at bedside. 

## 2017-10-04 NOTE — ED Notes (Signed)
Samantha from TTS called and states to notify [facility] when pt is medically clear for TTS evaluation.

## 2017-10-04 NOTE — ED Notes (Signed)
Tele neuro consult paged out.

## 2017-10-04 NOTE — ED Notes (Signed)
After further speaking with pt, determined he began having dizziness with loss of hearing in left ear suddenly on Sunday.  This was accompanied by a loss of balance and inability to walk due to room spinning and associated nausea/vomiting.  Pt thought this may be another stroke and did not want to live confined to a bed miserably dizzy.  States this prompted a depression with a perceived loss of function making him want to kill himself.  States he did not think about asking for help as there was nothing done regarding his previous stroke a month ago.

## 2017-10-04 NOTE — Consult Note (Signed)
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  Not apparent on MRI, but small brainstem strokes can be missed on MRI.  No neurointervention is possible in this case. 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  Recommendations:  This is a very difficult situation.  He needs permissive hypertension to augment flow given basilar insufficiency, but just had a probably hypertensive hemorrhage last month.  I would be reluctant to resume anticoagulation at this time given recent hemorrhage, although given CT appearance, I think safe at this time to resume ASA, although dual antiplatelet therapy may be risky.  Very high risk for recurrent stroke.  =>  Allow permissive HTN range systolic [id number].   =>  Resume ASA 81 mg. =>  High-dose statin. =>  PT.  Verdis Prime, MD TeleNeurology  ---------------------------------------------------------------------  History: 77 yo man with h/o HTN, A Fib previously on anticoagulation, L thalamic ICH (presumed hypertensive) last month with good recovery.  Have been holding antiplatelet/anticoagulant therapy.  Evaluatioon at that time showed a L thalamic ICH, advanced chronic small vessel disease, and vascular imaging showed chronic basilar artery occlusion with distal reconstitution.  Now patient reports that since yesterday he has had hearing loss left ear, mild tinnitus, vertigo/dizziness, significant gait imbalance, and subjective leg weakness.  Repeat MRI today showed no ischemia, MRA again shows basilar artery critical stenosis/occlusion.    Diagnostic Testing: Labs:  Reviewed. Trop elevation noted. Imaging: Brain MRI/MRA (10/04/17):  IMPRESSION: Brain MRI:  1. No acute finding. 2. Expected evolution of subacute left thalamic  hematoma. 3. Advanced chronic small vessel disease. Intracranial MRA: 1. 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 2. New poor flow related signal beyond the left P2 segment which could be artifactual or interval stenosis. Head CT (10/04/17):  IMPRESSION: 1. No acute intracranial abnormality. 2. Normal interval evolution of recently identified left thalamic hematoma, now largely chronic in appearance. 3. Atrophy with chronic small vessel ischemic disease with additional scatter remote infarcts as above. CTA (08/25/17):  IMPRESSION: 1. 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 2. Reconstitution of the basilar tip to the level of the superior cerebellar arteries via a right posterior communicating artery. 3. 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 4. High-grade stenosis of the vertebral arteries at the dural margin and beyond, left greater than right. 5. 70% stenosis of the right carotid bifurcation. 6. Dense calcifications in the distal left common carotid artery and carotid bifurcation without significant stenosis relative to the more distal vessel. 7. Mild distal right M1 segment stenosis. 8. Moderate proximal left A2 segment stenosis. Head CT (08/25/17):    IMPRESSION: 1. Oval left thalamic hemorrhage with an estimated blood volume of 8 mL. Mild surrounding edema and regional mass effect. No intraventricular or extra-axial extension of blood. 2. Underlying advanced chronic small vessel disease, indicating likely small vessel etiology of #1. 3. 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 4. Extensive intracranial calcified atherosclerosis.  Vital Signs:   BP 145/92.  Exam:  Mental Status:  Alert, oriented. Fluent speech, intact naming.  Cranial Nerves:  Pupils: Equal round and reactive to light Extraocular movements: Intact in all cardinal  gaze No visual field cut Facial sensation: Intact to light touch Facial movements: Intact and symmetric   Tongue midline No dysarthria  Motor Exam:  No drift x 4.  Fine finger movements normal and equal.  Tremor/Abnormal Movements:  No tremors, myoclonus.  Sensory Exam:   Intact to touch in all extremities.  Coordination:   No ataxia to finger/nose  Gait:  Ataxic cannot stand without assistance  Medical Decision Making:  - Extensive number of diagnosis or management options are considered above.   - Extensive amount of complex data reviewed.   - High risk of complication and/or morbidity or mortality are associated with differential diagnostic considerations above.  - There may be uncertain outcome and increased probability of prolonged functional impairment or high probability of severe prolonged functional impairment associated with some of these differential diagnosis.   Medical Data Reviewed:  1.Data reviewed include clinical labs, radiology,  Medical Tests;   2.Tests results discussed w/performing or interpreting physician;   3.Obtaining/reviewing old medical records;  4.Obtaining case history from another source;  5.Independent review of image, tracing or specimen.   Patient was informed the Neurology Consult would happen via telehealth (remote video) and consented to receiving care in this manner.
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
## 2017-10-04 NOTE — ED Triage Notes (Signed)
Pt brought in by [facility] for emergency committment; pt has made comments to his son and to the officers that he wants to end it all; pt states if he could get to the bullets for his gun he would kill himself;  Pt states he is depressed after having a stroke last month; pt is alert and cooperative at this time; pt states he was doing good until yesterday, he lost the hearing in his left ear and has been having trouble with his balance

## 2017-10-04 NOTE — ED Notes (Signed)
Patient transported to MRI 

## 2017-10-04 NOTE — ED Provider Notes (Signed)
[facility] EMERGENCY DEPARTMENT Provider Note   CSN: [id number] Arrival date & time: 10/04/17  0435     History   Chief Complaint Chief Complaint  Patient presents with  . V70.1    HPI Eugene [name] is a 77 y.o. male.  Patient brought in by the police under IVC after making suicidal statements at home.  Patient states he was doing well after a stroke last month until yesterday when he "lost hearing in my left ear" and having trouble walking and trouble with balance.  His last seen normal was 36 hours ago.  Patient was admitted to the hospital last month for thalamic hemorrhage with residual right-sided weakness.  He apparently told his son that he would have killed himself he if he could get the bullets to his gun.  He has been depressed ever since he had a stroke last month and feels like he might of had another stroke again with his new dizziness and difficulty walking.  He denies any hallucinations or homicidal thoughts. He denies any new focal weakness in his arms or legs.  No difficulty speaking or difficulty swallowing.  No headache or visual change.  No chest pain or shortness of breath.  No head trauma. Patient has a history of atrial fibrillation, now on Eliquis, CAD, hypertension, hyperlipidemia and previous MI.  The history is provided by the patient and the police.    Past Medical History:  Diagnosis Date  . Anxiety   . Atrial fibrillation ([place])   . BPH (benign prostatic hypertrophy)   . Cataracts, both eyes   . Coronary artery disease   . Depressed   . Hyperlipidemia   . Hypertension   . ICH (intracerebral hemorrhage) ([place]) 08/2017  . MI (myocardial infarction) ([place])   . OSA (obstructive sleep apnea)   . Pre-diabetes     Patient Active Problem List   Diagnosis Date Noted  . AKI (acute kidney injury) ([place])   . Stage 2 chronic kidney disease   . Neurogenic bladder   . 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  Prediabetes   . 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 Hypertensive emergency 08/31/2017  . Sundowning 08/31/2017  . Hx of completed stroke 08/31/2017  . Thalamic hemorrhage ([place])   . Right hemiparesis ([place])   . Chronic anticoagulation   . Dysmetria   . Benign essential HTN   . Hyperlipidemia   . OSA (obstructive sleep apnea)   . 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 ICH (intracerebral hemorrhage) ([facility]) - L thalamic d/t HTN in setting of Xarelto and plavix use 08/25/2017  . NSTEMI (non-ST elevated myocardial infarction) ([place])   . Diabetes mellitus type 2, noninsulin dependent ([place])   . Chest pain 06/24/2015  . Hyperglycemia 06/24/2015  . Pain in the chest   . Atrial fibrillation ([place]) 08/23/2009  . PLANTAR FASCIITIS, LEFT 12/31/2008  . TOBACCO ABUSE 04/10/2008  . REDNESS OR DISCHARGE OF EYE 03/09/2008  . RUQ PAIN 08/22/2007  . NEOPLASM, SKIN, UNCERTAIN BEHAVIOR 94/85/4627  . WEIGHT GAIN 03/17/2007  . SLEEP APNEA, OBSTRUCTIVE 09/22/2006  . MYOCARDIAL INFARCTION, HX OF 09/01/2006  . FATIGUE 09/01/2006  . HYPERLIPIDEMIA 06/22/2006  . ANXIETY 06/22/2006  . Depression 06/22/2006  . Hypertension 06/22/2006  . CAD (coronary artery disease) 06/22/2006  . BPH (benign prostatic hyperplasia) 06/22/2006    Past Surgical History:  Procedure Laterality Date  . CARDIAC CATHETERIZATION N/A 06/26/2015  Procedure: Left Heart Cath and Coronary Angiography;  Surgeon: Burnell Blanks, MD;  Location: [place] [facility];  Service: Cardiovascular;  Laterality: N/A;  . CARDIAC CATHETERIZATION  06/26/2015   Procedure: Coronary Stent Intervention;  Surgeon: Burnell Blanks, MD;  Location: [place] [facility];  Service: Cardiovascular;;  . CARDIAC CATHETERIZATION  06/26/2015   Procedure: Coronary Balloon Angioplasty;  Surgeon: Burnell Blanks, MD;  Location: [place] [facility];  Service: Cardiovascular;;  . CORONARY STENT PLACEMENT  10/2004        Home  Medications    Prior to Admission medications   Medication Sig Start Date End Date Taking? Authorizing Provider  amLODipine (NORVASC) 10 MG tablet Take 1 tablet (10 mg total) by mouth daily. 09/10/17  Yes Love, Ivan Anchors, PA-C  apixaban (ELIQUIS) 5 MG TABS tablet Take 1 tablet (5 mg total) by mouth 2 (two) times daily. 09/10/17  Yes Love, Ivan Anchors, PA-C  aspirin EC 81 MG EC tablet Take 1 tablet (81 mg total) by mouth daily. 09/01/17  Yes Donzetta Starch, NP  finasteride (PROSCAR) 5 MG tablet TAKE 1 BY MOUTH DAILY 09/10/17  Yes Love, Ivan Anchors, PA-C  hydrochlorothiazide (MICROZIDE) 12.5 MG capsule Take 1 capsule (12.5 mg total) by mouth daily. 09/11/17  Yes Love, Ivan Anchors, PA-C  lisinopril (PRINIVIL,ZESTRIL) 20 MG tablet Take 1 tablet (20 mg total) by mouth daily. 09/11/17  Yes Love, Ivan Anchors, PA-C  pantoprazole (PROTONIX) 40 MG tablet Take 1 tablet (40 mg total) by mouth daily. 09/10/17  Yes Love, Ivan Anchors, PA-C  PARoxetine (PAXIL) 20 MG tablet Take 1 tablet (20 mg total) by mouth daily. 09/10/17  Yes Love, Ivan Anchors, PA-C  rosuvastatin (CRESTOR) 20 MG tablet Take 1 tablet (20 mg total) by mouth daily. 09/11/17  Yes Love, Ivan Anchors, PA-C    Family History Family History  Problem Relation Age of Onset  . Sleep apnea Son     Social History Social History   Tobacco Use  . Smoking status: Current Every Day Smoker    Packs/day: 1.00    Years: 50.00    Pack years: 50.00  . Smokeless tobacco: Never Used  Substance Use Topics  . Alcohol use: No  . Drug use: No     Allergies   Patient has no known allergies.   Review of Systems Review of Systems  Constitutional: Negative for activity change, appetite change and fever.  HENT: Positive for hearing loss. Negative for congestion.   Respiratory: Negative for cough, chest tightness and shortness of breath.   Cardiovascular: Negative for chest pain.  Gastrointestinal: Negative for abdominal pain, nausea and vomiting.  Genitourinary: Negative for  dysuria and hematuria.  Musculoskeletal: Positive for gait problem. Negative for arthralgias and myalgias.  Skin: Negative for rash.  Neurological: Positive for dizziness. Negative for facial asymmetry, speech difficulty, weakness, light-headedness and headaches.  Psychiatric/Behavioral: Positive for decreased concentration, dysphoric mood, self-injury and suicidal ideas. The patient is nervous/anxious.    all other systems are negative except as noted in the HPI and PMH.     Physical Exam Updated Vital Signs BP (!) 145/92 (BP Location: Right Arm)   Pulse 84   Temp 98.1 F (36.7 C) (Oral)   Resp 20   Ht 6' (1.829 m)   Wt 86.2 kg (190 lb)   SpO2 97%   BMI 25.77 kg/m   Physical Exam  Constitutional: He is oriented to person, place, and time. He appears well-developed and well-nourished. No distress.  Flat affect  HENT:  Head: Normocephalic and atraumatic.  Mouth/Throat: Oropharynx is clear and moist. No oropharyngeal exudate.  Cerumen impaction bilaterally  Eyes: Pupils are equal, round, and reactive to light. Conjunctivae and EOM are normal.  Neck: Normal range of motion. Neck supple.  No meningismus.  Cardiovascular: Normal rate, normal heart sounds and intact distal pulses.  No murmur heard. Irregular rhythm   Pulmonary/Chest: Effort normal and breath sounds normal. No respiratory distress.  Abdominal: Soft. There is no tenderness. There is no rebound and no guarding.  Musculoskeletal: Normal range of motion. He exhibits no edema or tenderness.  Neurological: He is alert and oriented to person, place, and time. No cranial nerve deficit. He exhibits normal muscle tone. Coordination normal.  No ataxia on finger to nose bilaterally. No pronator drift. 4/5 strength on R side compared to L. Baseline per patient. CN 2-12 intact.Equal grip strength. Sensation intact.   Patient with wide-based stance with standing.  States he cannot put his legs together without feeling like is  going to fall down. No ataxia in finger-to-nose.  No visual field cuts.  Skin: Skin is warm.  Psychiatric: He has a normal mood and affect. His behavior is normal.  Nursing note and vitals reviewed.    ED Treatments / Results  Labs (all labs ordered are listed, but only abnormal results are displayed) Labs Reviewed  COMPREHENSIVE METABOLIC PANEL - Abnormal; Notable for the following components:      Result Value   Glucose, Bld 139 (*)    BUN 24 (*)    GFR calc non Af Amer 58 (*)    All other components within normal limits  ACETAMINOPHEN LEVEL - Abnormal; Notable for the following components:   Acetaminophen (Tylenol), Serum <10 (*)    All other components within normal limits  CBC - Abnormal; Notable for the following components:   WBC 17.9 (*)    All other components within normal limits  URINALYSIS, ROUTINE W REFLEX MICROSCOPIC - Abnormal; Notable for the following components:   Ketones, ur 20 (*)    Protein, ur 30 (*)    All other components within normal limits  TROPONIN I - Abnormal; Notable for the following components:   Troponin I 0.12 (*)    All other components within normal limits  ETHANOL  SALICYLATE LEVEL  RAPID URINE DRUG SCREEN, HOSP PERFORMED  PROTIME-INR  APTT  DIFFERENTIAL  TROPONIN I  I-STAT TROPONIN, ED    EKG EKG Interpretation  Date/Time:  Monday Oct 04 2017 05:26:18 EDT Ventricular Rate:  106 PR Interval:    QRS Duration: 94 QT Interval:  315 QTC Calculation: 419 R Axis:   41 Text Interpretation:  Atrial fibrillation Anterior infarct, old Minimal ST depression, inferior leads Rate faster Confirmed by Ezequiel Essex [phone]) on 10/04/2017 5:43:49 AM   Radiology Ct Head Wo Contrast  Result Date: 10/04/2017 CLINICAL DATA:  Initial evaluation for persistent vertigo, recent stroke. EXAM: CT HEAD WITHOUT CONTRAST TECHNIQUE: Contiguous axial images were obtained from the base of the skull through the vertex without intravenous contrast.  COMPARISON:  Prior CT from 09/10/2017. FINDINGS: Brain: Continued normal expected interval evolution of previously seen left thalamic hematoma, now largely chronic in appearance. Atrophy with chronic small vessel ischemic change, similar to previous. Remote left cerebellar infarct with dystrophic calcification. Remote right basal ganglia lacunar infarct noted as well. No acute large vessel territory infarct. No acute intracranial hemorrhage. No mass lesion, midline shift or mass effect. No hydrocephalus. No extra-axial fluid  collection. Vascular: Intracranial atherosclerotic change. No hyperdense vessel. Skull: Scalp soft tissues and calvarium demonstrate no acute abnormality. Sinuses/Orbits: Globes and orbital soft tissues demonstrate no acute finding. Scattered mucosal thickening throughout the ethmoidal air cells. Paranasal sinuses are otherwise largely clear. No mastoid effusion. Other: None. IMPRESSION: 1. No acute intracranial abnormality. 2. Normal interval evolution of recently identified left thalamic hematoma, now largely chronic in appearance. 3. Atrophy with chronic small vessel ischemic disease with additional scatter remote infarcts as above. Electronically Signed   By: Jeannine Boga M.D.   On: 10/04/2017 06:59    Procedures .Ear Cerumen Removal Date/Time: 10/04/2017 6:40 AM Performed by: Ezequiel Essex, MD Authorized by: Ezequiel Essex, MD   Consent:    Consent obtained:  Verbal   Consent given by:  Patient   Risks discussed:  Dizziness, incomplete removal, bleeding, pain and TM perforation Procedure details:    Location:  L ear and R ear   Procedure type: irrigation   Post-procedure details:    Inspection:  TM intact   Hearing quality:  Improved   Patient tolerance of procedure:  Tolerated well, no immediate complications   (including critical care time)  Medications Ordered in ED Medications - No data to display   Initial Impression / Assessment and Plan / ED  Course  I have reviewed the triage vital signs and the nursing notes.  Pertinent labs & imaging results that were available during my care of the patient were reviewed by me and considered in my medical decision making (see chart for details).    Patient brought in by [facility] department under IVC after expressing suicidal thoughts at home.  Patient states he believes he had a new stroke yesterday because he has had difficulty with ambulation, dizziness and decreased hearing for the past 36 hours.  Code stroke not activated due to delay in presentation.  Patient presents with new ataxia and dizziness, last seen normal 36 hours ago.  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  Neurology plan was to place him on Eliquis after his hemorrhage had resolved which did occur.  CT head today does not show any acute pathology. Troponin elevation is likely secondary to recent stroke.  Patient with no chest pain or shortness of breath.  Will obtain second troponin.  Concern that patient will not be medically clear for quite some time as he would need a neurology consult and MRI to rule out new stroke.  Discussed with Dr. Dyann Kief of the hospitalist service.  He requests neurology evaluation MRI prior to admission as there may not be any acute need for hospitalization.  Tele-neurology consult will be obtained.  MRI will be obtained. Next steps for medical clearance will be dictated by neurology. Dr. Jeneen Rinks to assume care at shift change.  Final Clinical Impressions(s) / ED Diagnoses   Final diagnoses:  None    ED Discharge Orders    None       Lasondra Hodgkins, Annie Main, MD 10/04/17 [phone]

## 2017-10-04 NOTE — ED Notes (Signed)
Date and time results received: 10/04/17 0657   Test: Troponin Critical Value: 0.12  Name of Provider Notified: Rancour, MD

## 2017-10-05 ENCOUNTER — Encounter (HOSPITAL_COMMUNITY): Payer: Self-pay | Admitting: Primary Care

## 2017-10-05 DIAGNOSIS — I482 Chronic atrial fibrillation: Secondary | ICD-10-CM

## 2017-10-05 DIAGNOSIS — Z7189 Other specified counseling: Secondary | ICD-10-CM

## 2017-10-05 DIAGNOSIS — Z515 Encounter for palliative care: Secondary | ICD-10-CM

## 2017-10-05 DIAGNOSIS — N182 Chronic kidney disease, stage 2 (mild): Secondary | ICD-10-CM

## 2017-10-05 DIAGNOSIS — T1491XA Suicide attempt, initial encounter: Secondary | ICD-10-CM

## 2017-10-05 DIAGNOSIS — G450 Vertebro-basilar artery syndrome: Secondary | ICD-10-CM

## 2017-10-05 LAB — BASIC METABOLIC PANEL
Anion gap: 8 (ref 5–15)
BUN: 25 mg/dL — ABNORMAL HIGH (ref 6–20)
CHLORIDE: 103 mmol/L (ref 101–111)
CO2: 27 mmol/L (ref 22–32)
Calcium: 9 mg/dL (ref 8.9–10.3)
Creatinine, Ser: 1.16 mg/dL (ref 0.61–1.24)
GFR calc non Af Amer: 58 mL/min — ABNORMAL LOW (ref >60)
Glucose, Bld: 97 mg/dL (ref 65–99)
POTASSIUM: 3.5 mmol/L (ref 3.5–5.1)
SODIUM: 138 mmol/L (ref 135–145)

## 2017-10-05 LAB — CBC
HEMATOCRIT: 43.7 % (ref 39.0–52.0)
HEMOGLOBIN: 14 g/dL (ref 13.0–17.0)
MCH: 29.5 pg (ref 26.0–34.0)
MCHC: 32 g/dL (ref 30.0–36.0)
MCV: 92 fL (ref 78.0–100.0)
Platelets: 185 10*3/uL (ref 150–400)
RBC: 4.75 MIL/uL (ref 4.22–5.81)
RDW: 14.6 % (ref 11.5–15.5)
WBC: 8.2 10*3/uL (ref 4.0–10.5)

## 2017-10-05 LAB — GLUCOSE, CAPILLARY: Glucose-Capillary: 146 mg/dL — ABNORMAL HIGH (ref 65–99)

## 2017-10-05 MED ORDER — ASPIRIN EC 325 MG PO TBEC
325.0000 mg | DELAYED_RELEASE_TABLET | Freq: Every day | ORAL | Status: DC
  Filled 2017-10-05: qty 1

## 2017-10-05 MED ORDER — MECLIZINE HCL 12.5 MG PO TABS
12.5000 mg | ORAL_TABLET | Freq: Three times a day (TID) | ORAL | Status: DC
  Administered 2017-10-05 – 2017-10-10 (×14): 12.5 mg via ORAL
  Filled 2017-10-05 (×15): qty 1

## 2017-10-05 MED ORDER — ASPIRIN EC 325 MG PO TBEC
325.0000 mg | DELAYED_RELEASE_TABLET | Freq: Every day | ORAL | Status: DC
  Administered 2017-10-05 – 2017-10-10 (×6): 325 mg via ORAL
  Filled 2017-10-05 (×5): qty 1

## 2017-10-05 MED ORDER — INSULIN ASPART 100 UNIT/ML ~~LOC~~ SOLN
0.0000 [IU] | Freq: Three times a day (TID) | SUBCUTANEOUS | Status: DC
  Administered 2017-10-08: 1 [IU] via SUBCUTANEOUS

## 2017-10-05 NOTE — Progress Notes (Signed)
[facility] PROGRESS NOTE  JW COVIN GBT:[id number] DOB: Mar 07, 1941 DOA: 10/04/2017 PCP: Susy Frizzle, MD  Interim summary and HPI 77 y/o male with PMH significant for A. Fib, HTN, HLD, BPH, pre-diabetes, anxiety/depression, tobacco abuse and recent left thalamic ICH; who presented to ED with complaints of poor balance, vertigo, left side hearing impairment and worsening right side weakness. Patient had mild residual right upper extremity weakness after left thalamic ICH; but was pretty functional and living alone. Patient symptoms presented approx 36 hours ago and failed to improve; patient felt frustrated with the situation and attempted to kill himself by blowing his head off with his pistol. Patient's son noticed that his father was done and making reference as the end was close; he removed patient's ammunition and truncated patient's plans (as he had the chance to pulled the trigger). Son then took the gun away and took patient to ED for further evaluation and treatment. There has not been any complaints of CP, SOB, palpitations, nausea, vomiting, abd pain, dysuria, hematuria, melena, hematochezia or any other complaints.   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  Of note, this type of stroke will not be able to be seen on MRI (especially acutely). -For now we will provide full dose aspirin for 1 week and subsequently patient will be placed back on baby aspirin along with Eliquis. -Continue physical therapy and Occupational Therapy -Continue blood pressure control (with permissive hypertension) -Follow any further neurology recommendations.  2-depression: With acute suicide attempt -Patient continue IVC and with bed  sitter -Still with positive suicidal thoughts and expressing feeling worthless -Continue Prozac -psych eval pending -but in my opinion will need inpatient psych  3-atrial fibrillation -rate controlled -continue amlodipine  -CHADSVASC score 5 -eventually will resume eliquis (see above)  4-diabetes mellitus type 2, non-insulin-dependent: with nephropathy  -Stable and well-controlled -At home he was use using diet management -Continue sliding scale insulin while inpatient.  5-hyperlipidemia -Continue statins  6-occlusion and stenosis of basilar artery -No further intubations can be offered -As mentioned above continue full dose aspirin for 1 week and then resumption of baby aspirin with Eliquis for secondary prevention -Continue to allow permissive hypertension to better flow through collaterals.  7-history of left thalamic hemorrhage -With overall good recovery -Close monitoring of blood pressure to prevent re-bleeding; especially with this permissive hypertension.  8-right hemiparesis -Has a residual deficit from left ICH -Continue PT/OT -Patient with positive right-sided neglection  9-BPH -continue proscar -no complaints of urinary retention   10-chronic stage 2 renal failure -Has remained at baseline -Continue to monitor renal function trend intermittently -Maintain adequate hydration.  Code Status: DNR Family Communication: Son at bedside. Disposition Plan: Patient will need in my opinion psychiatry inpatient services to further stabilize his mood, adjust/build antidepressant regimen and receive active psychotherapy.  Per neurology recommendation will treat with 1 week of full dose aspirin and after that we will resume baby aspirin with Eliquis.   Consultants:  Neurology  Psychiatry  Palliative care  Procedures:  See below for x-ray reports.  Antibiotics:  None  HPI/Subjective: Denies chest pain, no nausea, no vomiting, no shortness of breath.   Patient is still with suicidal thoughts and depressed mood.  Objective: Vitals:   10/05/17 0314 10/05/17 1513  BP: (!) 143/77 Marland Kitchen)  141/72  Pulse: 69 63  Resp: 15 19  Temp: 97.8 F (36.6 C) 98 F (36.7 C)  SpO2: 97% 100%    Intake/Output Summary (Last 24 hours) at 10/05/2017 1714 Last data filed at 10/05/2017 1300 Gross per 24 hour  Intake 1370.83 ml  Output 700 ml  Net 670.83 ml   Filed Weights   10/04/17 0447 10/04/17 1603  Weight: 86.2 kg (190 lb) 86.2 kg (190 lb)    Exam:   General: Still with a suicidal thoughts, depressed and feeling worthless.  Denies chest pain, shortness of breath, no nausea, no vomiting.  Still with intermittent episode of feeling dizzy and complaining of left side hearing.  Patient also with right side neglection.  Cardiovascular: Irregular, no rubs, no gallops, no murmurs, no JVD.  Respiratory: Good air movement bilaterally, no wheezing, no crackles, good oxygen saturation on room air.  Abdomen: Soft, nontender, nondistended, positive bowel sounds.  Musculoskeletal: No edema, no cyanosis, no clubbing.  Neurologic exam: Alert, awake and oriented x3, patient with left-sided hearing impairment, no pronator drift, 4/5 strength on his left upper extremity and reporting right side neglection.   Data Reviewed: Basic Metabolic Panel: Recent Labs  Lab 10/04/17 0547 10/04/17 2155 10/05/17 0430  NA 139  --  138  K 4.1  --  3.5  CL 101  --  103  CO2 28  --  27  GLUCOSE 139*  --  97  BUN 24*  --  25*  CREATININE 1.17  --  1.16  CALCIUM 9.6  --  9.0  MG  --  1.9  --   PHOS  --  3.2  --    Liver Function Tests: Recent Labs  Lab 10/04/17 0547  AST 16  ALT 17  ALKPHOS 63  BILITOT 0.8  PROT 7.1  ALBUMIN 3.9   CBC: Recent Labs  Lab 10/04/17 0547 10/05/17 0430  WBC 17.9* 8.2  NEUTROABS 15.8  --   HGB 15.2 14.0  HCT 46.7 43.7  MCV 91.9 92.0  PLT 192 185   Cardiac Enzymes: Recent Labs  Lab 10/04/17 0547 10/04/17 0904  TROPONINI  0.12* 0.18*    Studies: Ct Head Wo Contrast  Result Date: 10/04/2017 CLINICAL DATA:  Initial evaluation for persistent vertigo, recent stroke. EXAM: CT HEAD WITHOUT CONTRAST TECHNIQUE: Contiguous axial images were obtained from the base of the skull through the vertex without intravenous contrast. COMPARISON:  Prior CT from 09/10/2017. FINDINGS: Brain: Continued normal expected interval evolution of previously seen left thalamic hematoma, now largely chronic in appearance. Atrophy with chronic small vessel ischemic change, similar to previous. Remote left cerebellar infarct with dystrophic calcification. Remote right basal ganglia lacunar infarct noted as well. No acute large vessel territory infarct. No acute intracranial hemorrhage. No mass lesion, midline shift or mass effect. No hydrocephalus. No extra-axial fluid collection. Vascular: Intracranial atherosclerotic change. No hyperdense vessel. Skull: Scalp soft tissues and calvarium demonstrate no acute abnormality. Sinuses/Orbits: Globes and orbital soft tissues demonstrate no acute finding. Scattered mucosal thickening throughout the ethmoidal air cells. Paranasal sinuses are otherwise largely clear. No mastoid effusion. Other: None. IMPRESSION: 1. No acute intracranial abnormality. 2. Normal interval evolution of recently identified left thalamic hematoma, now largely chronic in appearance. 3. Atrophy with chronic small vessel ischemic disease with additional scatter remote infarcts as above. Electronically Signed   By: Jeannine Boga M.D.   On: 10/04/2017 06:59   Mr Jodene Nam Head Wo Contrast  Result Date: 10/04/2017 CLINICAL DATA:  Vertigo, persistent,  central EXAM: MRI HEAD WITHOUT CONTRAST MRA HEAD WITHOUT CONTRAST TECHNIQUE: Multiplanar, multiecho pulse sequences of the brain and surrounding structures were obtained without intravenous contrast. Angiographic images of the head were obtained using MRA technique without contrast. COMPARISON:   08/25/2017 FINDINGS: MRI HEAD FINDINGS Brain: Expected evolution of subacute hematoma in the left thalamus. No mass effect seen today. There is extensive chronic small vessel ischemic gliosis in the cerebral white matter. Remote small vessel infarct in the left cerebellum in bilateral basal ganglia. Generalized atrophy. No acute infarct, hemorrhage, hydrocephalus, or masslike finding. Vascular: Arterial findings below. Normal dural venous sinus flow voids. Skull and upper cervical spine: Negative for marrow lesion Sinuses/Orbits: Bilateral cataract resection. 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 There is poor flow seen beyond the left P2 segment, which could be related to direction of flow or new stenosis. No associated occipital infarct. There is poor signal in the left anterior temporal branch which is likely due to direction of flow, also seen previously. Mild to moderate narrowing of the right MCA. Mild narrowing of the left A1 segment. IMPRESSION: Brain MRI: 1. No acute finding. 2. Expected evolution of subacute left thalamic hematoma. 3. Advanced chronic small vessel disease. Intracranial MRA: 1. 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 2. New poor flow related signal beyond the left P2 segment which could be artifactual or interval stenosis. Electronically Signed   By: Monte Fantasia M.D.   On: 10/04/2017 08:32   Mr Brain Wo Contrast  Result Date: 10/04/2017 CLINICAL DATA:  Vertigo, persistent, central EXAM: MRI HEAD WITHOUT CONTRAST MRA HEAD WITHOUT CONTRAST TECHNIQUE: Multiplanar, multiecho pulse sequences of the brain and surrounding structures were obtained without intravenous contrast. Angiographic images of the head were obtained using MRA technique without contrast. COMPARISON:  08/25/2017 FINDINGS: MRI HEAD FINDINGS Brain: Expected evolution of subacute  hematoma in the left thalamus. No mass effect seen today. There is extensive chronic small vessel ischemic gliosis in the cerebral white matter. Remote small vessel infarct in the left cerebellum in bilateral basal ganglia. Generalized atrophy. No acute infarct, hemorrhage, hydrocephalus, or masslike finding. Vascular: Arterial findings below. Normal dural venous sinus flow voids. Skull and upper cervical spine: Negative for marrow lesion Sinuses/Orbits: Bilateral cataract resection. 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 There is poor flow seen beyond the left P2 segment, which could be related to direction of flow or new stenosis. No associated occipital infarct. There is poor signal in the left anterior temporal branch which is likely due to direction of flow, also seen previously. Mild to moderate narrowing of the right MCA. Mild narrowing of the left A1 segment. IMPRESSION: Brain MRI: 1. No acute finding. 2. Expected evolution of subacute left thalamic hematoma. 3. Advanced chronic small vessel disease. Intracranial MRA: 1. 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 2. New poor flow related signal beyond the left P2 segment which could be artifactual or interval stenosis. Electronically Signed   By: Monte Fantasia M.D.   On: 10/04/2017 08:32    Scheduled Meds: . amLODipine  10 mg Oral Daily  . aspirin EC  325 mg Oral Daily  . finasteride  5 mg Oral Daily  . FLUoxetine  20 mg Oral Daily  . hydrochlorothiazide  12.5 mg Oral Daily  . lisinopril  20 mg Oral Daily  .  multivitamin with minerals  1 tablet Oral Daily  . nicotine  14 mg Transdermal Daily  . pantoprazole  40 mg Oral Daily  . rosuvastatin  20 mg Oral Daily   Continuous Infusions:  Principal Problem:   TIA involving basilar artery Active Problems:   Depression   Atrial fibrillation (HCC)    Diabetes mellitus type 2, noninsulin dependent (HCC)   Hyperlipidemia   Occlusion and stenosis of basilar artery   Thalamic hemorrhage (HCC)   Right hemiparesis ([facility])   Stage 2 chronic kidney disease   Suicidal behavior with attempted self-injury [facility])   Palliative care by specialist   Goals of care, counseling/discussion   DNR (do not resuscitate) discussion    Time spent: 24 minutes    [facility] Pager [phone]. If 7PM-7AM, please contact night-coverage at [email or web address], password [facility] 10/05/2017, 5:14 PM  LOS: 1 day

## 2017-10-05 NOTE — Evaluation (Signed)
Physical Therapy Evaluation Patient Details Name: Eugene [name] MRN: [id number] DOB: Dec 22, 1940 Today's Date: 10/05/2017   History of Present Illness  Eugene [name] is a 77 y/o male with PMH significant for A. Fib, HTN, HLD, BPH, pre-diabetes, anxiety/depression, tobacco abuse and recent left thalamic ICH; who presented to ED with complaints of poor balance, vertigo, left side hearing impairment and worsening right side weakness. Patient had mild residual right upper extremity weakness after left thalamic ICH; but was pretty functional and living alone. Patient symptoms presented approx 36 hours ago and failed to improve; patient felt frustrated with the situation and attempted to kill himself by blowing his head off with his pistol. Patient's son noticed that his father was done and making reference as the end was close; he removed patient's ammunition and truncated patient's plans (as he had the chance to pulled the trigger). Son then took the gun away and took patient to ED for further evaluation and treatment.    Clinical Impression  Patient demonstrates unsteady ataxic like gait, presently at risk for falls and requires use of RW to ambulate in hallway, limited mostly due to c/o dizziness (room spinning like sensation) and fatigue.  Patient tolerated sitting up at bedside after therapy with sitter in room.  Patient will benefit from continued physical therapy in hospital and recommended venue below to increase strength, balance, endurance for safe ADLs and gait.    Follow Up Recommendations SNF;Supervision/Assistance - 24 hour    Equipment Recommendations  None recommended by PT    Recommendations for Other Services       Precautions / Restrictions Precautions Precautions: Fall Restrictions Weight Bearing Restrictions: No      Mobility  Bed Mobility Overal bed mobility: Needs Assistance Bed Mobility: Supine to Sit;Sit to Supine     Supine to sit: Min guard Sit to  supine: Supervision   General bed mobility comments: c/o dizziness (room spinning when sitting up)  Transfers Overall transfer level: Needs assistance Equipment used: Rolling walker (2 wheeled) Transfers: Sit to/from [facility] Sit to Stand: Min assist Stand pivot transfers: Min assist          Ambulation/Gait Ambulation/Gait assistance: Min assist Ambulation Distance (Feet): 45 Feet Assistive device: Rolling walker (2 wheeled) Gait Pattern/deviations: Decreased step length - right;Decreased step length - left;Ataxic;Decreased stride length Gait velocity: slow   General Gait Details: very unsteady on feet when attempting gait without use of AD, required use of RW for safety demonstrating slow sligthtly labored cadence with wide base of support, limited secondary to c/o fatigue  Stairs            Wheelchair Mobility    Modified Rankin (Stroke Patients Only)       Balance Overall balance assessment: Needs assistance Sitting-balance support: Feet supported;No upper extremity supported Sitting balance-Leahy Scale: Good     Standing balance support: No upper extremity supported;During functional activity Standing balance-Leahy Scale: Poor Standing balance comment: fair/poor without AD, fair using RW                             Pertinent Vitals/Pain Pain Assessment: No/denies pain    Home Living Family/patient expects to be discharged to:: Private residence Living Arrangements: Alone Available Help at Discharge: Family Type of Home: House Home Access: Stairs to enter Entrance Stairs-Rails: Right Entrance Stairs-Number of Steps: 3 Home Layout: Laundry or work area in [place]: Environmental consultant - 2 wheels;Cane - single  point;Shower seat      Prior Function Level of Independence: Independent with assistive device(s)         Comments: short distanced community ambulator with SPC PRN     Hand Dominance   Dominant Hand:  Right    Extremity/Trunk Assessment   Upper Extremity Assessment Upper Extremity Assessment: Defer to OT evaluation    Lower Extremity Assessment Lower Extremity Assessment: Generalized weakness    Cervical / Trunk Assessment Cervical / Trunk Assessment: Normal  Communication   Communication: No difficulties  Cognition Arousal/Alertness: Awake/alert Behavior During Therapy: WFL for tasks assessed/performed Overall Cognitive Status: Within Functional Limits for tasks assessed                                        General Comments      Exercises     Assessment/Plan    PT Assessment Patient needs continued PT services  PT Problem List Decreased strength;Decreased activity tolerance;Decreased balance;Decreased mobility       PT Treatment Interventions Gait training;Functional mobility training;Therapeutic activities;Therapeutic exercise;Stair training;Patient/family education;Neuromuscular re-education    PT Goals (Current goals can be found in the Care Plan section)  Acute Rehab PT Goals Patient Stated Goal: return home  Time For Goal Achievement: 10/19/17 Potential to Achieve Goals: Good    Frequency Min 3X/week   Barriers to discharge        Co-evaluation               AM-PAC PT "6 Clicks" Daily Activity  Outcome Measure Difficulty turning over in bed (including adjusting bedclothes, sheets and blankets)?: None Difficulty moving from lying on back to sitting on the side of the bed? : A Little Difficulty sitting down on and standing up from a chair with arms (e.g., wheelchair, bedside commode, etc,.)?: A Little Help needed moving to and from a bed to chair (including a wheelchair)?: A Little Help needed walking in hospital room?: A Little Help needed climbing 3-5 steps with a railing? : A Lot 6 Click Score: 18    End of Session Equipment Utilized During Treatment: Gait belt Activity Tolerance: Patient tolerated treatment well;Patient  limited by fatigue Patient left: in bed;with call bell/phone within reach;with nursing/sitter in room(left seated at bedside) Nurse Communication: Mobility status PT Visit Diagnosis: Unsteadiness on feet (R26.81);Other abnormalities of gait and mobility (R26.89);Muscle weakness (generalized) (M62.81)    Time: 5035-4656 PT Time Calculation (min) (ACUTE ONLY): 27 min   Charges:   PT Evaluation $PT Eval Moderate Complexity: 1 Mod PT Treatments $Therapeutic Activity: 23-37 mins   PT G Codes:        2:13 PM, 2017-10-11 Lonell Grandchild, MPT Physical Therapist with [facility] 336 [phone] office [phone] mobile phone

## 2017-10-05 NOTE — Plan of Care (Signed)
progressing 

## 2017-10-05 NOTE — Plan of Care (Signed)
  Problem: Acute Rehab PT Goals(only PT should resolve) Goal: Pt Will Go Supine/Side To Sit Outcome: Progressing Flowsheets (Taken 10/05/2017 1415) Pt will go Supine/Side to Sit: with modified independence Goal: Patient Will Transfer Sit To/From Stand Outcome: Progressing Flowsheets (Taken 10/05/2017 1415) Patient will transfer sit to/from stand: with supervision Goal: Pt Will Transfer Bed To Chair/Chair To Bed Outcome: Progressing Flowsheets (Taken 10/05/2017 1415) Pt will Transfer Bed to Chair/Chair to Bed: with supervision Goal: Pt Will Ambulate Outcome: Progressing Flowsheets (Taken 10/05/2017 1415) Pt will Ambulate: 100 feet;with supervision;with rolling walker  2:16 PM, 10/05/17 Lonell Grandchild, MPT Physical Therapist with [facility] 336 [phone] office [phone] mobile phone

## 2017-10-05 NOTE — Consult Note (Signed)
[place] A. Eugene Laughter, MD     [email or web address]          Eugene [name] is an 77 y.o. male.   ASSESSMENT/PLAN: Acute unilateral hearing loss and vertigo: This is classsic for acute infarction of the ipsilateral AICA - (anterior inferior cerebellar artery) especially in this gentleman with severely diseased basilar artery.  I think this is mostly a platelet phenomena although he does have atrial fibrillation.  I recommend full-dose aspirin for the next week.  Subsequently, he can revert back to anticoagulation and aspirin 81 mg. Agree with statin and BP control.   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  It appears that the going thought was that he was okay to restart anticoagulation after 1 month.  Additionally, his anticoagulation was switched from Xarelto to Eliquis which is thought to have less risk of intracranial hemorrhage.  It seems reasonable therefore to restart the Eliquis as outlined above 1 week from now and reducing the aspirin to 81 mg.     The patient is a 77 year old white male who presents with the acute onset of left hearing loss and vertiginous symptoms.  He was previously hospitalized in [place] for left thalamic hemorrhage while he was on Xarelto and Plavix.  There was some question as to the patient not taking his medications as prescribed and possibly taking more.  The patient was sent to rehab and it was thought that he could be restarted on anticoagulation after about for 3-4 weeks if hemorrhage has not increased in size.  The goal was to switch the Xarelto due to Eliquis as this has less risk of intracranial hemorrhage.  It appears this was done after repeat CT scan showed no increase hemorrhage.  Unfortunately, the patient presented while on Eliquis and the low-dose aspirin with the acute hearing loss and vertiginous symptoms.  The patient tells me that he did have  right-sided weakness after his event 6 weeks ago.  Tells me that the right leg improved significantly but the right arm seemed not to improve as much.  He does not report having increasing right-sided symptoms with his current event.  The patient does not report having diplopia or dysarthria with his current events.  He denies any loss of consciousness.  No chest pain or shortness of breath.  The review of systems otherwise negative.  GENERAL:  This is a pleasant male who is in no acute distress.  HEENT:  No evidence of trauma neck is supple.  ABDOMEN: Soft  EXTREMITIES: No edema   BACK: Normal alignment.  SKIN: Normal by inspection.    MENTAL STATUS: Alert and oriented - including his age and the month. Speech - shows subtle dysarthria, language and cognition are generally intact. Judgment and insight normal.   CRANIAL NERVES: Pupils are equal, round and reactive to light and accommodation; extraocular movements are full, there is no significant nystagmus; upper and lower facial muscles are normal in strength and symmetric, there is no flattening of the nasolabial folds; tongue is midline; uvula is midline; shoulder elevation is normal.  MOTOR: Normal tone, bulk and strength; no pronator drift.  No drift of the upper lower extremities.  COORDINATION: Left finger to nose is normal, right finger to nose is normal, No rest tremor; no intention tremor; no postural tremor; no bradykinesia.  REFLEXES: Deep tendon reflexes are symmetrical and normal.   SENSATION: Normal to light touch and temperature.  There is no extinction to double simultaneous stimulation.   NIH stroke scale 1.       Blood pressure (!) 143/77, pulse 69, temperature 97.8 F (36.6 C), temperature source Oral, resp. rate 15, height 6' (1.829 m), weight 190 lb (86.2 kg), SpO2 97 %.  Past Medical History:  Diagnosis Date  . Anxiety   . Atrial fibrillation ([place])   . BPH (benign prostatic hypertrophy)   . Cataracts,  both eyes   . Coronary artery disease   . Depressed   . Hyperlipidemia   . Hypertension   . ICH (intracerebral hemorrhage) ([place]) 08/2017  . MI (myocardial infarction) ([place])   . OSA (obstructive sleep apnea)   . Pre-diabetes     Past Surgical History:  Procedure Laterality Date  . CARDIAC CATHETERIZATION N/A 06/26/2015   Procedure: Left Heart Cath and Coronary Angiography;  Surgeon: Burnell Blanks, MD;  Location: [facility];  Service: Cardiovascular;  Laterality: N/A;  . CARDIAC CATHETERIZATION  06/26/2015   Procedure: Coronary Stent Intervention;  Surgeon: Burnell Blanks, MD;  Location: [facility];  Service: Cardiovascular;;  . CARDIAC CATHETERIZATION  06/26/2015   Procedure: Coronary Balloon Angioplasty;  Surgeon: Burnell Blanks, MD;  Location: [facility];  Service: Cardiovascular;;  . CORONARY STENT PLACEMENT  10/2004    Family History  Problem Relation Age of Onset  . Sleep apnea Son     Social History:  reports that he has been smoking.  He has a 50.00 pack-year smoking history. He has never used smokeless tobacco. He reports that he does not drink alcohol or use drugs.  Allergies: No Known Allergies  Medications: Prior to Admission medications   Medication Sig Start Date End Date Taking? Authorizing Provider  amLODipine (NORVASC) 10 MG tablet Take 1 tablet (10 mg total) by mouth daily. 09/10/17  Yes Love, Ivan Anchors, PA-C  apixaban (ELIQUIS) 5 MG TABS tablet Take 1 tablet (5 mg total) by mouth 2 (two) times daily. 09/10/17  Yes Love, Ivan Anchors, PA-C  aspirin EC 81 MG EC tablet Take 1 tablet (81 mg total) by mouth daily. 09/01/17  Yes Donzetta Starch, NP  finasteride (PROSCAR) 5 MG tablet TAKE 1 BY MOUTH DAILY 09/10/17  Yes Love, Ivan Anchors, PA-C  hydrochlorothiazide (MICROZIDE) 12.5 MG capsule Take 1 capsule (12.5 mg total) by mouth daily. 09/11/17  Yes Love, Ivan Anchors, PA-C  lisinopril (PRINIVIL,ZESTRIL) 20 MG tablet Take 1 tablet (20 mg  total) by mouth daily. 09/11/17  Yes Love, Ivan Anchors, PA-C  pantoprazole (PROTONIX) 40 MG tablet Take 1 tablet (40 mg total) by mouth daily. 09/10/17  Yes Love, Ivan Anchors, PA-C  PARoxetine (PAXIL) 20 MG tablet Take 1 tablet (20 mg total) by mouth daily. 09/10/17  Yes Love, Ivan Anchors, PA-C  rosuvastatin (CRESTOR) 20 MG tablet Take 1 tablet (20 mg total) by mouth daily. 09/11/17  Yes Love, Ivan Anchors, PA-C    Scheduled Meds: . amLODipine  10 mg Oral Daily  . aspirin EC  81 mg Oral Daily  . finasteride  5 mg Oral Daily  . FLUoxetine  20 mg Oral Daily  . hydrochlorothiazide  12.5 mg Oral Daily  . lisinopril  20 mg Oral Daily  . multivitamin with minerals  1 tablet Oral Daily  . nicotine  14 mg Transdermal Daily  . pantoprazole  40 mg Oral Daily  . rosuvastatin  20 mg Oral Daily   Continuous Infusions: . sodium chloride 50 mL/hr at  10/04/17 1847   PRN Meds:.acetaminophen **OR** acetaminophen, meclizine, ondansetron **OR** ondansetron (ZOFRAN) IV     Results for orders placed or performed during the hospital encounter of 10/04/17 (from the past 48 hour(s))  Rapid urine drug screen (hospital performed)     Status: None   Collection Time: 10/04/17  4:52 AM  Result Value Ref Range   Opiates NONE DETECTED NONE DETECTED   Cocaine NONE DETECTED NONE DETECTED   Benzodiazepines NONE DETECTED NONE DETECTED   Amphetamines NONE DETECTED NONE DETECTED   Tetrahydrocannabinol NONE DETECTED NONE DETECTED   Barbiturates NONE DETECTED NONE DETECTED    Comment: (NOTE) DRUG SCREEN FOR MEDICAL PURPOSES ONLY.  IF CONFIRMATION IS NEEDED FOR ANY PURPOSE, NOTIFY LAB WITHIN 5 DAYS. LOWEST DETECTABLE LIMITS FOR URINE DRUG SCREEN Drug Class                     Cutoff (ng/mL) Amphetamine and metabolites    1000 Barbiturate and metabolites    200 Benzodiazepine                 185 Tricyclics and metabolites     300 Opiates and metabolites        300 Cocaine and metabolites        300 THC                             50 Performed at [facility], [address] [place], [place] [address]   Urinalysis, Routine w reflex microscopic     Status: Abnormal   Collection Time: 10/04/17  5:10 AM  Result Value Ref Range   Color, Urine YELLOW YELLOW   APPearance CLEAR CLEAR   Specific Gravity, Urine 1.020 1.005 - 1.030   pH 6.0 5.0 - 8.0   Glucose, UA NEGATIVE NEGATIVE mg/dL   Hgb urine dipstick NEGATIVE NEGATIVE   Bilirubin Urine NEGATIVE NEGATIVE   Ketones, ur 20 (A) NEGATIVE mg/dL   Protein, ur 30 (A) NEGATIVE mg/dL   Nitrite NEGATIVE NEGATIVE   Leukocytes, UA NEGATIVE NEGATIVE   RBC / HPF 0-5 0 - 5 RBC/hpf   WBC, UA 0-5 0 - 5 WBC/hpf   Bacteria, UA NONE SEEN NONE SEEN   Squamous Epithelial / LPF 0-5 0 - 5   Mucus PRESENT    Hyaline Casts, UA PRESENT     Comment: Performed at [facility], [address], [place], [place] [address]  Comprehensive metabolic panel     Status: Abnormal   Collection Time: 10/04/17  5:47 AM  Result Value Ref Range   Sodium 139 135 - 145 mmol/L   Potassium 4.1 3.5 - 5.1 mmol/L   Chloride 101 101 - 111 mmol/L   CO2 28 22 - 32 mmol/L   Glucose, Bld 139 (H) 65 - 99 mg/dL   BUN 24 (H) 6 - 20 mg/dL   Creatinine, Ser 1.17 0.61 - 1.24 mg/dL   Calcium 9.6 8.9 - 10.3 mg/dL   Total Protein 7.1 6.5 - 8.1 g/dL   Albumin 3.9 3.5 - 5.0 g/dL   AST 16 15 - 41 U/L   ALT 17 17 - 63 U/L   Alkaline Phosphatase 63 38 - 126 U/L   Total Bilirubin 0.8 0.3 - 1.2 mg/dL   GFR calc non Af Amer 58 (L) >60 mL/min   GFR calc Af Amer >60 >60 mL/min    Comment: (NOTE) The eGFR has been calculated using the CKD  EPI equation. This calculation has not been validated in all clinical situations. eGFR's persistently <60 mL/min signify possible Chronic Kidney Disease.    Anion gap 10 5 - 15    Comment: Performed at [facility], [address], [place], [place] [address]  Ethanol     Status: None   Collection Time: 10/04/17  5:47 AM  Result Value Ref Range   Alcohol, Ethyl (B) <10 <10 mg/dL     Comment: (NOTE) Lowest detectable limit for serum alcohol is 10 mg/dL. For medical purposes only. Performed at [facility], [address], [place], [place] [address]   Salicylate level     Status: None   Collection Time: 10/04/17  5:47 AM  Result Value Ref Range   Salicylate Lvl <5.4 2.8 - 30.0 mg/dL    Comment: Performed at [facility], [address] [place], [place] [address]  Acetaminophen level     Status: Abnormal   Collection Time: 10/04/17  5:47 AM  Result Value Ref Range   Acetaminophen (Tylenol), Serum <10 (L) 10 - 30 ug/mL    Comment: (NOTE) Therapeutic concentrations vary significantly. A range of 10-30 ug/mL  may be an effective concentration for many patients. However, some  are best treated at concentrations outside of this range. Acetaminophen concentrations >150 ug/mL at 4 hours after ingestion  and >50 ug/mL at 12 hours after ingestion are often associated with  toxic reactions. Performed at [facility], [address], [place], [place] [address]   cbc     Status: Abnormal   Collection Time: 10/04/17  5:47 AM  Result Value Ref Range   WBC 17.9 (H) 4.0 - 10.5 K/uL   RBC 5.08 4.22 - 5.81 MIL/uL   Hemoglobin 15.2 13.0 - 17.0 g/dL   HCT 46.7 39.0 - 52.0 %   MCV 91.9 78.0 - 100.0 fL   MCH 29.9 26.0 - 34.0 pg   MCHC 32.5 30.0 - 36.0 g/dL   RDW 14.4 11.5 - 15.5 %   Platelets 192 150 - 400 K/uL    Comment: Performed at [facility], [address], [place], [place] [address]  Protime-INR     Status: None   Collection Time: 10/04/17  5:47 AM  Result Value Ref Range   Prothrombin Time 15.1 11.4 - 15.2 seconds   INR 1.20     Comment: Performed at [facility], [address] [place], [place] [address]  APTT     Status: None   Collection Time: 10/04/17  5:47 AM  Result Value Ref Range   aPTT 32 24 - 36 seconds    Comment: Performed at [facility], [address], [place], [place] [address]  Differential     Status: None   Collection Time: 10/04/17  5:47 AM    Result Value Ref Range   Neutrophils Relative % 86 %   Neutro Abs 15.8 1.7 - 7.7 K/uL   Lymphocytes Relative 7 %   Lymphs Abs 1.2 0.7 - 4.0 K/uL   Monocytes Relative 7 %   Monocytes Absolute 1.2 0.1 - 1.0 K/uL   Eosinophils Relative 0 %   Eosinophils Absolute 0.0 0.0 - 0.7 K/uL   Basophils Relative 0 %   Basophils Absolute 0.0 0.0 - 0.1 K/uL    Comment: Performed at [facility], [address] [place], [place] [address]  Troponin I     Status: Abnormal   Collection Time: 10/04/17  5:47 AM  Result Value Ref Range   Troponin I 0.12 (  HH) <0.03 ng/mL    Comment: CRITICAL RESULT CALLED TO, READ BACK BY AND VERIFIED WITH: SAPPELT,J AT 6:55AM ON 10/04/17 BY [facility] Performed at [facility], [address] [place], [place] [address]   I-stat troponin, ED     Status: None   Collection Time: 10/04/17  5:59 AM  Result Value Ref Range   Troponin i, poc 0.07 0.00 - 0.08 ng/mL   Comment 3            Comment: Due to the release kinetics of cTnI, a negative result within the first hours of the onset of symptoms does not rule out myocardial infarction with certainty. If myocardial infarction is still suspected, repeat the test at appropriate intervals.   Troponin I     Status: Abnormal   Collection Time: 10/04/17  9:04 AM  Result Value Ref Range   Troponin I 0.18 (HH) <0.03 ng/mL    Comment: CRITICAL VALUE NOTED.  VALUE IS CONSISTENT WITH PREVIOUSLY REPORTED AND CALLED VALUE. Performed at [facility], [address], [place], [place] [address]   Magnesium     Status: None   Collection Time: 10/04/17  9:55 PM  Result Value Ref Range   Magnesium 1.9 1.7 - 2.4 mg/dL    Comment: Performed at [facility], [address], [place], [place] [address]  Phosphorus     Status: None   Collection Time: 10/04/17  9:55 PM  Result Value Ref Range   Phosphorus 3.2 2.5 - 4.6 mg/dL    Comment: Performed at [facility], [address], [place], [place] [address]  Basic metabolic panel     Status:  Abnormal   Collection Time: 10/05/17  4:30 AM  Result Value Ref Range   Sodium 138 135 - 145 mmol/L   Potassium 3.5 3.5 - 5.1 mmol/L   Chloride 103 101 - 111 mmol/L   CO2 27 22 - 32 mmol/L   Glucose, Bld 97 65 - 99 mg/dL   BUN 25 (H) 6 - 20 mg/dL   Creatinine, Ser 1.16 0.61 - 1.24 mg/dL   Calcium 9.0 8.9 - 10.3 mg/dL   GFR calc non Af Amer 58 (L) >60 mL/min   GFR calc Af Amer >60 >60 mL/min    Comment: (NOTE) The eGFR has been calculated using the CKD EPI equation. This calculation has not been validated in all clinical situations. eGFR's persistently <60 mL/min signify possible Chronic Kidney Disease.    Anion gap 8 5 - 15    Comment: Performed at [facility], [address], [place], [place] [address]  CBC     Status: None   Collection Time: 10/05/17  4:30 AM  Result Value Ref Range   WBC 8.2 4.0 - 10.5 K/uL   RBC 4.75 4.22 - 5.81 MIL/uL   Hemoglobin 14.0 13.0 - 17.0 g/dL   HCT 43.7 39.0 - 52.0 %   MCV 92.0 78.0 - 100.0 fL   MCH 29.5 26.0 - 34.0 pg   MCHC 32.0 30.0 - 36.0 g/dL   RDW 14.6 11.5 - 15.5 %   Platelets 185 150 - 400 K/uL    Comment: Performed at [facility], [address] [place], [place] [address]    Studies/Results:   BRAIN MRI MRA FINDINGS: MRI HEAD FINDINGS  Brain: Expected evolution of subacute hematoma in the left thalamus. No mass effect seen today. There is extensive chronic small vessel ischemic gliosis in the cerebral white matter. Remote small vessel infarct in the left cerebellum in  bilateral basal ganglia. Generalized atrophy. No acute infarct, hemorrhage, hydrocephalus, or masslike finding.  Vascular: Arterial findings below. Normal dural venous sinus flow voids.  Skull and upper cervical spine: Negative for marrow lesion  Sinuses/Orbits: Bilateral cataract resection.  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 There is poor flow  seen beyond the left P2 segment, which could be related to direction of flow or new stenosis. No associated occipital infarct. There is poor signal in the left anterior temporal branch which is likely due to direction of flow, also seen previously. Mild to moderate narrowing of the right MCA. Mild narrowing of the left A1 segment.  IMPRESSION: Brain MRI:  1. No acute finding. 2. Expected evolution of subacute left thalamic hematoma. 3. Advanced chronic small vessel disease.  Intracranial MRA:  1. 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 2. New poor flow related signal beyond the left P2 segment which could be artifactual or interval stenosis.     The current MRA and MRI are reviewed in person and compared to the previous scan. The scans are done on different scanners with the previous scan there is seen beginning to have better quality. There is again increased signal seen on DWI involving the left thalamic region with surrounding reduced signal. This pattern is preserved in the SWI sequences. There is also increased signal involving the same region on T1 and T2. FLAIR imaging shows severe periventricular and deep white matter increased signal consistent with chronic microvascular changes. There is encephalomalacia involving the left inferior cerebellar region and a couple areas involving the right putaminal area. These are consistent with prior infarcts. No clear acute ischemic infarcts or hemorrhages are appreciated.    Amberlin Utke A. Eugene [name], M.D.  Diplomate, Tax adviser of Psychiatry and Neurology ( Neurology). 10/05/2017, 8:59 AM

## 2017-10-05 NOTE — Progress Notes (Signed)
OT Cancellation Note  Patient Details Name: Eugene [name] MRN: [id number] DOB: 1941/02/18   Cancelled Treatment:    Reason Eval/Treat Not Completed: Patient declined, no reason specified. Attempted to see pt with PT this am. Pt marginally agreeable to evaluation, upon sitting at EOB pt reports severe dizziness and returns to supine. Pt with multiple attempts, finally refusing stating "I can't do it." Will attempt to see pt again at a later time. See PT note for bed mobility.   Guadelupe Sabin, OTR/L  [phone] 10/05/2017, 8:49 AM

## 2017-10-05 NOTE — Consult Note (Signed)
Consultation Note Date: 10/05/2017   Patient Name: Eugene [name]  DOB: [birth year]  MRN: [id number]  Age / Sex: 77 y.o., male  PCP: Susy Frizzle, MD Referring Physician: Barton Dubois, MD  Reason for Consultation: Establishing goals of care  HPI/Patient Profile: 77 y.o. male  with past medical history of A. fib, high blood pressure and cholesterol, BPH, anxiety depression, tobacco use abuse continues to smoke 1 pack/day, recent left thalamic ICH admitted on 10/04/2017 with TIA involving basilar artery, depression with acute suicide attempt.   Clinical Assessment and Goals of Care: Mr. Swiss is resting quietly in bed.  Sitter is at bedside.  Sitter leaves while I am having conversation with Mr. Leiner, but returns as I am leaving.  There is no family at bedside at this time. Mr. Colligan and I talk about healthcare power of attorney.  He states in the past he had named his daughter, Aida Puffer as his healthcare surrogate decision-maker.  He states that he believes his paperwork is in a cardboard box and he is DN with other paperwork.  He states that he believes now that his son Liliane Channel should be his healthcare surrogate as he lives in [place], outside [place], and is closer than Chandler who lives in [place].  I shared that we can help him complete this paperwork during his hospitalization once he is cleared by behavioral health. We talked about advanced directives.  Mr. Funari endorses DNR, I ask if his son Liliane Channel could abide this choice.  Mr. Mendibles states that he believes his son could.  We talked about what he office and may be's.  Mr. Luppino states that he would not want a tube to feed him if he could not eat, he states that he would be agreeable to not treat pneumonia if he were to get sick.  He states that he would like in-home hospice services if he qualified. Mr. Ferrara  agrees for me to call his son Liliane Channel to update them.  I share that we will follow-up tomorrow.  Health care power of attorney NEXT OF KIN   SUMMARY OF RECOMMENDATIONS   Once cleared by behavioral health, complete advanced directives/HC POA. In-home hospice services if qualified.  Code Status/Advance Care Planning:  DNR  Symptom Management:   Per hospitalist, no additional needs.   Palliative Prophylaxis:   No special needs at this time.  Additional Recommendations (Limitations, Scope, Preferences):  Continue to treat the treatable but no extraordinary measures.  Requesting in-home hospice care if qualified.  Psycho-social/Spiritual:   Desire for further Chaplaincy support:no  Additional Recommendations: Caregiving  Support/Resources and Education on Hospice  Prognosis:   Unable to determine, 6 months or less would not be surprising based on history of left thalamic ICH, A. fib, tobacco abuse with COPD.   Discharge Planning: To be determined, based on outcomes.      Primary Diagnoses: Present on Admission: . TIA involving basilar artery . Thalamic hemorrhage ([place]) . Stage 2 chronic kidney disease . Occlusion and stenosis of  basilar artery . Hyperlipidemia . Depression . Atrial fibrillation ([place]) . Suicidal behavior with attempted self-injury [facility])   I have reviewed the medical record, interviewed the patient and family, and examined the patient. The following aspects are pertinent.  Past Medical History:  Diagnosis Date  . Anxiety   . Atrial fibrillation ([place])   . BPH (benign prostatic hypertrophy)   . Cataracts, both eyes   . Coronary artery disease   . Depressed   . Hyperlipidemia   . Hypertension   . ICH (intracerebral hemorrhage) ([place]) 08/2017  . MI (myocardial infarction) ([place])   . OSA (obstructive sleep apnea)   . Pre-diabetes    Social History   Socioeconomic History  . Marital status: Divorced    Spouse name: Not on file  . Number of  children: Not on file  . Years of education: Not on file  . Highest education level: Not on file  Occupational History  . Occupation: Retired Civil engineer, contracting for [employer]  . Financial resource strain: Not on file  . Food insecurity:    Worry: Not on file    Inability: Not on file  . Transportation needs:    Medical: Not on file    Non-medical: Not on file  Tobacco Use  . Smoking status: Current Every Day Smoker    Packs/day: 1.00    Years: 50.00    Pack years: 50.00  . Smokeless tobacco: Never Used  Substance and Sexual Activity  . Alcohol use: No  . Drug use: No  . Sexual activity: Not Currently  Lifestyle  . Physical activity:    Days per week: Patient refused    Minutes per session: Patient refused  . Stress: Not on file  Relationships  . Social connections:    Talks on phone: Patient refused    Gets together: Patient refused    Attends religious service: Patient refused    Active member of club or organization: Patient refused    Attends meetings of clubs or organizations: Patient refused    Relationship status: Patient refused  Other Topics Concern  . Not on file  Social History Narrative  . Not on file   Family History  Problem Relation Age of Onset  . Sleep apnea Son    Scheduled Meds: . amLODipine  10 mg Oral Daily  . aspirin EC  325 mg Oral Daily  . finasteride  5 mg Oral Daily  . FLUoxetine  20 mg Oral Daily  . hydrochlorothiazide  12.5 mg Oral Daily  . lisinopril  20 mg Oral Daily  . multivitamin with minerals  1 tablet Oral Daily  . nicotine  14 mg Transdermal Daily  . pantoprazole  40 mg Oral Daily  . rosuvastatin  20 mg Oral Daily   Continuous Infusions: PRN Meds:.acetaminophen **OR** acetaminophen, meclizine, ondansetron **OR** ondansetron (ZOFRAN) IV Medications Prior to Admission:  Prior to Admission medications   Medication Sig Start Date End Date Taking? Authorizing Provider  amLODipine (NORVASC) 10 MG tablet Take 1 tablet  (10 mg total) by mouth daily. 09/10/17  Yes Love, Ivan Anchors, PA-C  apixaban (ELIQUIS) 5 MG TABS tablet Take 1 tablet (5 mg total) by mouth 2 (two) times daily. 09/10/17  Yes Love, Ivan Anchors, PA-C  aspirin EC 81 MG EC tablet Take 1 tablet (81 mg total) by mouth daily. 09/01/17  Yes Donzetta Starch, NP  finasteride (PROSCAR) 5 MG tablet TAKE 1 BY MOUTH DAILY 09/10/17  Yes Love, Ivan Anchors, PA-C  hydrochlorothiazide (MICROZIDE) 12.5 MG capsule Take 1 capsule (12.5 mg total) by mouth daily. 09/11/17  Yes Love, Ivan Anchors, PA-C  lisinopril (PRINIVIL,ZESTRIL) 20 MG tablet Take 1 tablet (20 mg total) by mouth daily. 09/11/17  Yes Love, Ivan Anchors, PA-C  pantoprazole (PROTONIX) 40 MG tablet Take 1 tablet (40 mg total) by mouth daily. 09/10/17  Yes Love, Ivan Anchors, PA-C  PARoxetine (PAXIL) 20 MG tablet Take 1 tablet (20 mg total) by mouth daily. 09/10/17  Yes Love, Ivan Anchors, PA-C  rosuvastatin (CRESTOR) 20 MG tablet Take 1 tablet (20 mg total) by mouth daily. 09/11/17  Yes Love, Ivan Anchors, PA-C   No Known Allergies Review of Systems  Unable to perform ROS: Mental status change    Physical Exam  Constitutional: No distress.  HENT:  Head: Atraumatic.  Cardiovascular: Normal rate.  Pulmonary/Chest: Effort normal. No respiratory distress.  Abdominal: Soft. He exhibits no distension.  Musculoskeletal: He exhibits no edema.  Neurological: He is alert.  Skin: Skin is warm and dry.  Nursing note and vitals reviewed.   Vital Signs: BP (!) 141/72 (BP Location: Right Arm)   Pulse 63   Temp 98 F (36.7 C) (Oral)   Resp 19   Ht 6' (1.829 m)   Wt 86.2 kg (190 lb)   SpO2 100%   BMI 25.77 kg/m  Pain Scale: 0-10   Pain Score: 0-No pain   SpO2: SpO2: 100 % O2 Device:SpO2: 100 % O2 Flow Rate: .   IO: Intake/output summary:   Intake/Output Summary (Last 24 hours) at 10/05/2017 1631 Last data filed at 10/05/2017 1300 Gross per 24 hour  Intake 1370.83 ml  Output 700 ml  Net 670.83 ml    LBM: Last BM Date:  10/03/17 Baseline Weight: Weight: 86.2 kg (190 lb) Most recent weight: Weight: 86.2 kg (190 lb)     Palliative Assessment/Data:   Flowsheet Rows     Most Recent Value  Intake Tab  Referral Department  Hospitalist  Unit at Time of Referral  Cardiac/Telemetry Unit  Palliative Care Primary Diagnosis  Other (Comment)  Date Notified  10/04/17  Palliative Care Type  New Palliative care  Reason for referral  Clarify Goals of Care, Psychosocial or Spiritual support  Date of Admission  10/04/17  Date first seen by Palliative Care  10/05/17  # of days Palliative referral response time  1 Day(s)  # of days IP prior to Palliative referral  0  Clinical Assessment  Palliative Performance Scale Score  60%  Pain Max last 24 hours  Not able to report  Pain Min Last 24 hours  Not able to report  Dyspnea Max Last 24 Hours  Not able to report  Dyspnea Min Last 24 hours  Not able to report  Psychosocial & Spiritual Assessment  Palliative Care Outcomes  Patient/Family meeting held?  Yes  Who was at the meeting?  patient at bedside,agress to have PMT call his son  Palliative Care Outcomes  Clarified goals of care, Provided end of life care assistance, Provided psychosocial or spiritual support, Provided advance care planning  Patient/Family wishes: Interventions discontinued/not started   Mechanical Ventilation, PEG, Tube feedings/TPN      Time In: 1540 Time Out: 1610 Time Total: 30 minutes Greater than 50%  of this time was spent counseling and coordinating care related to the above assessment and plan.  Signed by: Drue Novel, NP   Please contact Palliative Medicine Team phone at [phone] for questions and concerns.  For  individual provider: See Shea Evans

## 2017-10-06 ENCOUNTER — Encounter (HOSPITAL_COMMUNITY): Payer: Self-pay | Admitting: Registered Nurse

## 2017-10-06 DIAGNOSIS — R45851 Suicidal ideations: Secondary | ICD-10-CM

## 2017-10-06 LAB — GLUCOSE, CAPILLARY
GLUCOSE-CAPILLARY: 114 mg/dL — AB (ref 65–99)
GLUCOSE-CAPILLARY: 118 mg/dL — AB (ref 65–99)
Glucose-Capillary: 117 mg/dL — ABNORMAL HIGH (ref 65–99)
Glucose-Capillary: 150 mg/dL — ABNORMAL HIGH (ref 65–99)

## 2017-10-06 MED ORDER — ENOXAPARIN SODIUM 40 MG/0.4ML ~~LOC~~ SOLN
40.0000 mg | SUBCUTANEOUS | Status: DC
  Administered 2017-10-06 – 2017-10-10 (×5): 40 mg via SUBCUTANEOUS
  Filled 2017-10-06 (×5): qty 0.4

## 2017-10-06 NOTE — BH Assessment (Signed)
Assessment Note  Eugene [name] is an 77 y.o. male. Pt reports SI. Per Pt he had plans to shoot himself but his son had removed the bullets from his gun. Per Pt he wanted to kill himself because of the stroke he suffered last month and recent hearing loss.The Pt states he is suffering from vertigo and nausea. The Pt denies previous outpatient treatment. Pt denies current outpatient treatment. Pt denies SA. Pt currently has multiple physical health concerns that are being treated.   Shuvon, NP recommends am psych evaluation to determine safety and stabilization.   Diagnosis:  F32.2 MDD  Past Medical History:  Past Medical History:  Diagnosis Date  . Anxiety   . Atrial fibrillation ([place])   . BPH (benign prostatic hypertrophy)   . Cataracts, both eyes   . Coronary artery disease   . Depressed   . Hyperlipidemia   . Hypertension   . ICH (intracerebral hemorrhage) ([place]) 08/2017  . MI (myocardial infarction) ([place])   . OSA (obstructive sleep apnea)   . Pre-diabetes     Past Surgical History:  Procedure Laterality Date  . CARDIAC CATHETERIZATION N/A 06/26/2015   Procedure: Left Heart Cath and Coronary Angiography;  Surgeon: Burnell Blanks, MD;  Location: [facility];  Service: Cardiovascular;  Laterality: N/A;  . CARDIAC CATHETERIZATION  06/26/2015   Procedure: Coronary Stent Intervention;  Surgeon: Burnell Blanks, MD;  Location: [facility];  Service: Cardiovascular;;  . CARDIAC CATHETERIZATION  06/26/2015   Procedure: Coronary Balloon Angioplasty;  Surgeon: Burnell Blanks, MD;  Location: [facility];  Service: Cardiovascular;;  . CORONARY STENT PLACEMENT  10/2004    Family History:  Family History  Problem Relation Age of Onset  . Sleep apnea Son     Social History:  reports that he has been smoking.  He has a 50.00 pack-year smoking history. He has never used smokeless tobacco. He reports that he does not drink alcohol or use  drugs.  Additional Social History:  Alcohol / Drug Use Pain Medications: please see mar Prescriptions: please see mar Over the Counter: please see mar History of alcohol / drug use?: No history of alcohol / drug abuse Longest period of sobriety (when/how long): NA  CIWA: CIWA-Ar BP: (!) 143/86 Pulse Rate: 70 COWS:    Allergies: No Known Allergies  Home Medications:  Medications Prior to Admission  Medication Sig Dispense Refill  . amLODipine (NORVASC) 10 MG tablet Take 1 tablet (10 mg total) by mouth daily. 30 tablet 0  . apixaban (ELIQUIS) 5 MG TABS tablet Take 1 tablet (5 mg total) by mouth 2 (two) times daily. 60 tablet 0  . aspirin EC 81 MG EC tablet Take 1 tablet (81 mg total) by mouth daily.    . finasteride (PROSCAR) 5 MG tablet TAKE 1 BY MOUTH DAILY 30 tablet 0  . hydrochlorothiazide (MICROZIDE) 12.5 MG capsule Take 1 capsule (12.5 mg total) by mouth daily. 30 capsule 0  . lisinopril (PRINIVIL,ZESTRIL) 20 MG tablet Take 1 tablet (20 mg total) by mouth daily. 30 tablet 0  . pantoprazole (PROTONIX) 40 MG tablet Take 1 tablet (40 mg total) by mouth daily. 30 tablet 0  . PARoxetine (PAXIL) 20 MG tablet Take 1 tablet (20 mg total) by mouth daily. 30 tablet 0  . rosuvastatin (CRESTOR) 20 MG tablet Take 1 tablet (20 mg total) by mouth daily. 30 tablet 0    OB/GYN Status:  No LMP for male patient.  General Assessment Data Location of Assessment: [facility] Assessment Services(AP Med floor) TTS Assessment: In system Is this a Tele or Face-to-Face Assessment?: Tele Assessment Is this an Initial Assessment or a Re-assessment for this encounter?: Initial Assessment Marital status: Single Maiden name: NA Is patient pregnant?: No Pregnancy Status: No Living Arrangements: Alone Can pt return to current living arrangement?: Yes Admission Status: Involuntary Is patient capable of signing voluntary admission?: No Referral Source: Self/Family/Friend Insurance type: [place] Living Arrangements: Alone Legal Guardian: Other:(self) Name of Psychiatrist: NA Name of Therapist: NA  Education Status Is patient currently in school?: No  Risk to self with the past 6 months Suicidal Ideation: Yes-Currently Present Has patient been a risk to self within the past 6 months prior to admission? : No Suicidal Intent: Yes-Currently Present Has patient had any suicidal intent within the past 6 months prior to admission? : No Is patient at risk for suicide?: Yes Suicidal Plan?: Yes-Currently Present Has patient had any suicidal plan within the past 6 months prior to admission? : No Specify Current Suicidal Plan: to shoot himself Access to Means: Yes Specify Access to Suicidal Means: had a access to a gun What has been your use of drugs/alcohol within the last 12 months?: NA Previous Attempts/Gestures: No How many times?: 0 Other Self Harm Risks: NA Triggers for Past Attempts: None known Intentional Self Injurious Behavior: None Family Suicide History: No Recent stressful life event(s): Other (Comment)(stroke) Persecutory voices/beliefs?: No Depression: Yes Depression Symptoms: Tearfulness, Isolating, Loss of interest in usual pleasures, Feeling angry/irritable, Feeling worthless/self pity Substance abuse history and/or treatment for substance abuse?: No Suicide prevention information given to non-admitted patients: Not applicable  Risk to Others within the past 6 months Homicidal Ideation: No Does patient have any lifetime risk of violence toward others beyond the six months prior to admission? : No Thoughts of Harm to Others: No Current Homicidal Intent: No Current Homicidal Plan: No Access to Homicidal Means: No Identified Victim: NA History of harm to others?: No Assessment of Violence: None Noted Violent Behavior Description: NA Does patient have access to weapons?: No Criminal Charges Pending?: No Does patient have a court date: No Is patient  on probation?: No  Psychosis Hallucinations: None noted Delusions: None noted  Mental Status Report Appearance/Hygiene: Unremarkable Eye Contact: Fair Motor Activity: Freedom of movement Speech: Logical/coherent Level of Consciousness: Alert Mood: Depressed Affect: Depressed Anxiety Level: None Thought Processes: Coherent, Relevant Judgement: Unimpaired Orientation: Person, Place, Time, Situation Obsessive Compulsive Thoughts/Behaviors: None  Cognitive Functioning Concentration: Normal Memory: Recent Intact, Remote Intact Is patient IDD: No Is patient DD?: No Insight: Fair Impulse Control: Fair Appetite: Fair Have you had any weight changes? : No Change Sleep: No Change Total Hours of Sleep: 8 Vegetative Symptoms: None  ADLScreening [facility] Assessment Services) Patient's cognitive ability adequate to safely complete daily activities?: Yes Patient able to express need for assistance with ADLs?: Yes Independently performs ADLs?: Yes (appropriate for developmental age)  Prior Inpatient Therapy Prior Inpatient Therapy: No  Prior Outpatient Therapy Prior Outpatient Therapy: No Does patient have an ACCT team?: No Does patient have Intensive In-House Services?  : No Does patient have [facility] services? : No Does patient have P4CC services?: No  ADL Screening (condition at time of admission) Patient's cognitive ability adequate to safely complete daily activities?: Yes Is the patient deaf or have difficulty hearing?: Yes Does the patient have difficulty seeing, even when wearing glasses/contacts?: Yes Does the patient have difficulty  concentrating, remembering, or making decisions?: Yes Patient able to express need for assistance with ADLs?: Yes Does the patient have difficulty dressing or bathing?: No Independently performs ADLs?: Yes (appropriate for developmental age) Communication: Independent Dressing (OT): Needs assistance Grooming: Needs assistance Feeding:  Independent Bathing: Needs assistance Toileting: Needs assistance In/Out Bed: Needs assistance Does the patient have difficulty walking or climbing stairs?: Yes Weakness of Legs: None Weakness of Arms/Hands: None  Home Assistive Devices/Equipment Home Assistive Devices/Equipment: Cane (specify quad or straight)  Therapy Consults (therapy consults require a physician order) PT Evaluation Needed: No OT Evalulation Needed: No SLP Evaluation Needed: No Abuse/Neglect Assessment (Assessment to be complete while patient is alone) Abuse/Neglect Assessment Can Be Completed: Yes Physical Abuse: Denies Verbal Abuse: Denies Sexual Abuse: Denies Exploitation of patient/patient's resources: Denies Self-Neglect: Denies Values / Beliefs Cultural Requests During Hospitalization: None Spiritual Requests During Hospitalization: None Consults Spiritual Care Consult Needed: No Social Work Consult Needed: No Regulatory affairs officer (For Healthcare) Does Patient Have a Medical Advance Directive?: No Does patient want to make changes to medical advance directive?: No - Patient declined Type of Advance Directive: Living will Copy of Living Will in Chart?: No - copy requested Nutrition Screen- [place] Adult/WL/AP Patient's home diet: Regular Has the patient recently lost weight without trying?: No Has the patient been eating poorly because of a decreased appetite?: No Malnutrition Screening Tool Score: 0  Additional Information 1:1 In Past 12 Months?: No CIRT Risk: No Elopement Risk: No Does patient have medical clearance?: No     Disposition:  Disposition Initial Assessment Completed for this Encounter: Yes Disposition of Patient: (am psych evalution)  On Site Evaluation by:   Reviewed with Physician:    Cyndia Bent 10/06/2017 3:15 PM

## 2017-10-06 NOTE — Progress Notes (Addendum)
PROGRESS NOTE    Eugene [name]  NLZ:[id number] DOB: 28-Jul-1940 DOA: 10/04/2017 PCP: Susy Frizzle, MD    Brief Narrative:  77 year old with past medical history relevant for paroxysmal atrial fibrillation, hypertension, hyperlipidemia, BPH, anxiety/depression, recent CVA who presented with vertigo and right upper extremity weakness and was found to have clinical stroke with negative CT and MRI and found to have fairly severe suicide attempt. Currently patient is medically stable from a stroke standpoint and pending psychiatry evaluation for possible inpatient psych.   Assessment & Plan:   Principal Problem:   Suicidal ideation Active Problems:   Depression   Atrial fibrillation (HCC)   Diabetes mellitus type 2, noninsulin dependent ([place])   Hyperlipidemia   Occlusion and stenosis of basilar artery   Thalamic hemorrhage (HCC)   Right hemiparesis (HCC)   Stage 2 chronic kidney disease   TIA involving basilar artery   Suicidal behavior with attempted self-injury [facility])   Palliative care by specialist   Goals of care, counseling/discussion   DNR (do not resuscitate) discussion   #) TIA/recurrent stroke: Clinically neurology expressed concern that patient could have experienced a stroke that would not appear on MRI or CT possibly involving the cochlear artery which would explain his vertiginous symptoms.  Per their note the plan is to do full dose aspirin for 1 week until 10/12/2017 and then restarted on apixaban.  His prior stroke was hemorrhagic and he was transitioned from rivaroxaban to apixaban due to the possible risk of less intracranial hemorrhage with this drug. -Per PT recommendation is skilled nursing facility with 24-hour assistance however this is complicated by his active suicidal ideation (see below) -Continue aspirin 325 mg until 10/12/2017 then transition to aspirin 81 mg and apixaban 5 mg twice daily -Medically stable for psychiatry evaluation (see below)  #)  Suicidal ideation: Patient continues to endorse suicidal ideation and feelings of hopelessness secondary to the continued debilitation from his stroke.  He reports that he would use his guns however these were removed from his house.  The other concern of course is that he is on multiple medications will be quite dangerous if misused or overdosed on including a fairly powerful novel oral anticoagulant and multiple antihypertensives.  Interestingly enough his depressive symptoms began after his first intracranial hemorrhage/stroke.  Query whether he might benefit from increasing his paroxetine dose or possibly transitioning to another medication. -Sitter -Psychiatry consult  #) Prior hemorrhagic stroke: Patient was noted to have  intracranial hemorrhage of the thalamus.  This was attributed to his rivaroxaban and he was transitioned to apixaban. - Hold apixaban 5 mg twice daily -Continue full dose aspirin  #) Paroxysmal atrial fibrillation: -Per above continue full dose aspirin -Hold apixaban until 10/12/2017  #)hypertension/hyperlipidemia: -Continue amlodipine 10 mg daily -Continue HCTZ 12.5 mg daily -Continue lisinopril 20 mg daily -Continue rosuvastatin 20 mg daily  #) BPH: -Continue finasteride 5 mg daily  #) Psych: -Continue fluoxetine 20 mg daily  #) Coronary artery disease status post stenting: Patient's last stent was put in mid LAD with drug-eluting stent in 06/26/2015.  He was noted to have a patent PDA stent with minimal restenosis.  He also had a balloon angioplasty of a stenosed diagonal branch. -Continue aspirin -Patient cannot tolerate beta-blocker due to heart rate -Continue statin  Fluids: Tolerating p.o. Electrolyte: Monitor and supplement Nutrition: Heart healthy diet  Prophylaxis: Enoxaparin  DO NOT RESUSCITATE  Consultants:   Palliative care  Neurology  Procedures:   None  Antimicrobials:   None  Subjective: Patient reports he is feeling better  but endorses significant suicidal ideation.  He reports he is tired of living.  He endorses feeling cold and ugly.  He denies any hallucinations, or delusions.  Objective: Vitals:   10/05/17 0314 10/05/17 1513 10/05/17 2036 10/06/17 0619  BP: (!) 143/77 (!) 141/72 129/80 129/85  Pulse: 69 63 (!) 59 (!) 51  Resp: 15 19 17    Temp: 97.8 F (36.6 C) 98 F (36.7 C) 97.8 F (36.6 C) 97.9 F (36.6 C)  TempSrc: Oral Oral Oral   SpO2: 97% 100% 97% 98%  Weight:      Height:        Intake/Output Summary (Last 24 hours) at 10/06/2017 0923 Last data filed at 10/06/2017 0815 Gross per 24 hour  Intake 1560 ml  Output 525 ml  Net 1035 ml   Filed Weights   10/04/17 0447 10/04/17 1603  Weight: 86.2 kg (190 lb) 86.2 kg (190 lb)    Examination:  General exam: Appears calm and comfortable  Respiratory system: Clear to auscultation. Respiratory effort normal. Cardiovascular system: Irregularly irregular, no murmur Gastrointestinal system: Abdomen is nondistended, soft and nontender. No organomegaly or masses felt. Normal bowel sounds heard. Central nervous system: Alert and oriented. No focal neurological deficits. 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, rhinophyma Psychiatry: Poor judgment and insight, mood appears depressed, continues to endorse suicidal ideation but no homicidal ideation,    Data Reviewed: I have personally reviewed following labs and imaging studies  CBC: Recent Labs  Lab 10/04/17 0547 10/05/17 0430  WBC 17.9* 8.2  NEUTROABS 15.8  --   HGB 15.2 14.0  HCT 46.7 43.7  MCV 91.9 92.0  PLT 192 191   Basic Metabolic Panel: Recent Labs  Lab 10/04/17 0547 10/04/17 2155 10/05/17 0430  NA 139  --  138  K 4.1  --  3.5  CL 101  --  103  CO2 28  --  27  GLUCOSE 139*  --  97  BUN 24*  --  25*  CREATININE 1.17  --  1.16  CALCIUM 9.6  --  9.0  MG  --  1.9  --     PHOS  --  3.2  --    GFR: Estimated Creatinine Clearance: 57.6 mL/min (by C-G formula based on SCr of 1.16 mg/dL). Liver Function Tests: Recent Labs  Lab 10/04/17 0547  AST 16  ALT 17  ALKPHOS 63  BILITOT 0.8  PROT 7.1  ALBUMIN 3.9   No results for input(s): LIPASE, AMYLASE in the last 168 hours. No results for input(s): AMMONIA in the last 168 hours. Coagulation Profile: Recent Labs  Lab 10/04/17 0547  INR 1.20   Cardiac Enzymes: Recent Labs  Lab 10/04/17 0547 10/04/17 0904  TROPONINI 0.12* 0.18*   BNP (last 3 results) No results for input(s): PROBNP in the last 8760 hours. HbA1C: No results for input(s): HGBA1C in the last 72 hours. CBG: Recent Labs  Lab 10/05/17 2030 10/06/17 0833  GLUCAP 146* 117*   Lipid Profile: No results for input(s): CHOL, HDL, LDLCALC, TRIG, CHOLHDL, LDLDIRECT in the last 72 hours. Thyroid Function Tests: No results for input(s): TSH, T4TOTAL, FREET4, T3FREE, THYROIDAB in the last 72 hours. Anemia Panel: No results for input(s): VITAMINB12, FOLATE, FERRITIN, TIBC, IRON, RETICCTPCT in the last 72 hours. Sepsis Labs: No results for input(s): PROCALCITON, LATICACIDVEN in the last 168 hours.  No results found for this or any previous visit (from the past 240 hour(s)).       Radiology Studies: No results found.      Scheduled Meds: . amLODipine  10 mg Oral Daily  . aspirin EC  325 mg Oral Daily  . finasteride  5 mg Oral Daily  . FLUoxetine  20 mg Oral Daily  . hydrochlorothiazide  12.5 mg Oral Daily  . insulin aspart  0-9 Units Subcutaneous TID WC  . lisinopril  20 mg Oral Daily  . meclizine  12.5 mg Oral TID  . multivitamin with minerals  1 tablet Oral Daily  . nicotine  14 mg Transdermal Daily  . pantoprazole  40 mg Oral Daily  . rosuvastatin  20 mg Oral Daily   Continuous Infusions:   LOS: 2 days    Time spent: Captain Cook, MD [facility]  If 7PM-7AM, please contact  night-coverage [email or web address] Password [facility] 10/06/2017, 9:23 AM

## 2017-10-06 NOTE — Care Management Important Message (Signed)
Important Message  Patient Details  Name: Eugene [name] MRN: [id number] Date of Birth: 09-19-1940   Medicare Important Message Given:  Yes    Shelda Altes 10/06/2017, 3:00 PM

## 2017-10-06 NOTE — Progress Notes (Signed)
Palliative: Mr. Kem is lying quietly in bed.  Minimal interaction today as he is still not cleared by psychiatry. Call to son Liliane Channel.  We talked about healthcare power of attorney.  Liliane Channel states that he found his father's paperwork naming daughter Aida Puffer first and Liliane Channel second as Garment/textile technologist.   We also talked about CODE STATUS.  We talked about the concept of treat the treatable but no extraordinary measures such as CPR or intubation.  Liliane Channel states that he could accept DNR for his father.  We talked about the concept of let nature take its course.  I shared that at this point, Mr. Gendreau has no chronic disease or illness other than risk for stroke that is life limiting, but this is a concept that they may face in the future.  We also talked about the future possibilities of hospice services.  All questions answered. 25 minutes Quinn Axe, NP Palliative Medicine Team Team Phone # [phone]

## 2017-10-06 NOTE — Progress Notes (Signed)
Physical Therapy Treatment Patient Details Name: Eugene [name] MRN: [id number] DOB: [birth year] Today's Date: 10/06/2017    History of Present Illness Eugene [name] is a 77 y/o male with PMH significant for A. Fib, HTN, HLD, BPH, pre-diabetes, anxiety/depression, tobacco abuse and recent left thalamic ICH; who presented to ED with complaints of poor balance, vertigo, left side hearing impairment and worsening right side weakness. Patient had mild residual right upper extremity weakness after left thalamic ICH; but was pretty functional and living alone. Patient symptoms presented approx 36 hours ago and failed to improve; patient felt frustrated with the situation and attempted to kill himself by blowing his head off with his pistol. Patient's son noticed that his father was done and making reference as the end was close; he removed patient's ammunition and truncated patient's plans (as he had the chance to pulled the trigger). Son then took the gun away and took patient to ED for further evaluation and treatment.    PT Comments    Pt supine in bed and willing to participate.  Gait training with RW increased distance, slight unsteadiness though no LOB with use of AD.  Upon entrance to room pt requested to return to bed with sitter in room.  Pt left supine with call bell within reach.  Therapist asked pt if he needed anything his only request was for a shotgun.  RN made aware of statement.     Follow Up Recommendations  SNF;Supervision/Assistance - 24 hour     Equipment Recommendations  None recommended by PT    Recommendations for Other Services       Precautions / Restrictions Precautions Precautions: Fall Restrictions Weight Bearing Restrictions: No    Mobility  Bed Mobility Overal bed mobility: Modified Independent Bed Mobility: Supine to Sit     Supine to sit: Min guard     General bed mobility comments: increased time and motivation required to complete task,  able to complete with mod I with cueing for hand placement  Transfers Overall transfer level: Modified independent Equipment used: Rolling walker (2 wheeled) Transfers: Sit to/from Stand Sit to Stand: Min guard         General transfer comment: cueing for hand placement to assist with STS  Ambulation/Gait Ambulation/Gait assistance: Min assist Ambulation Distance (Feet): 80 Feet Assistive device: Rolling walker (2 wheeled) Gait Pattern/deviations: Decreased step length - right;Decreased step length - left;Ataxic;Decreased stride length Gait velocity: slow   General Gait Details: slight unsteady with initial standing, used RW with no LOB episodes; slight labored due to fatigue at EOS;    Marine scientist Rankin (Stroke Patients Only)       Balance                                            Cognition Arousal/Alertness: Awake/alert Behavior During Therapy: WFL for tasks assessed/performed Overall Cognitive Status: Within Functional Limits for tasks assessed                                        Exercises      General Comments        Pertinent Vitals/Pain Pain Assessment: No/denies pain    Home  Living                      Prior Function            PT Goals (current goals can now be found in the care plan section) Progress towards PT goals: Progressing toward goals    Frequency    7X/week      PT Plan      Co-evaluation              AM-PAC PT "6 Clicks" Daily Activity  Outcome Measure  Difficulty turning over in bed (including adjusting bedclothes, sheets and blankets)?: None Difficulty moving from lying on back to sitting on the side of the bed? : None Difficulty sitting down on and standing up from a chair with arms (e.g., wheelchair, bedside commode, etc,.)?: A Little Help needed moving to and from a bed to chair (including a wheelchair)?: A  Little Help needed walking in hospital room?: A Little Help needed climbing 3-5 steps with a railing? : A Lot 6 Click Score: 19    End of Session Equipment Utilized During Treatment: Gait belt Activity Tolerance: Patient tolerated treatment well;Patient limited by fatigue Patient left: with chair alarm set;with call bell/phone within reach;in bed Nurse Communication: Mobility status PT Visit Diagnosis: Unsteadiness on feet (R26.81);Other abnormalities of gait and mobility (R26.89);Muscle weakness (generalized) (M62.81)     Time: 5465-6812 PT Time Calculation (min) (ACUTE ONLY): 12 min  Charges:  $Therapeutic Activity: 8-22 mins                    G Codes:       Ihor Austin, LPTA; CBIS [phone]  Aldona Lento 10/06/2017, 1:03 PM

## 2017-10-06 NOTE — Clinical Social Work Note (Signed)
Message left for TTS advising that attending indicated that patient was medically stable for assessment.     Farooq Petrovich, Clydene Pugh, LCSW

## 2017-10-07 LAB — BASIC METABOLIC PANEL
Anion gap: 7 (ref 5–15)
Calcium: 9.1 mg/dL (ref 8.9–10.3)
Creatinine, Ser: 1.43 mg/dL — ABNORMAL HIGH (ref 0.61–1.24)
GFR calc Af Amer: 53 mL/min — ABNORMAL LOW (ref >60)
GFR calc non Af Amer: 45 mL/min — ABNORMAL LOW (ref >60)
Glucose, Bld: 117 mg/dL — ABNORMAL HIGH (ref 65–99)
Potassium: 3.6 mmol/L (ref 3.5–5.1)

## 2017-10-07 LAB — BASIC METABOLIC PANEL WITH GFR
BUN: 27 mg/dL — ABNORMAL HIGH (ref 6–20)
CO2: 31 mmol/L (ref 22–32)
Chloride: 102 mmol/L (ref 101–111)
Sodium: 140 mmol/L (ref 135–145)

## 2017-10-07 LAB — GLUCOSE, CAPILLARY
GLUCOSE-CAPILLARY: 84 mg/dL (ref 65–99)
GLUCOSE-CAPILLARY: 107 mg/dL — AB (ref 65–99)
Glucose-Capillary: 79 mg/dL (ref 65–99)
Glucose-Capillary: 110 mg/dL — ABNORMAL HIGH (ref 65–99)

## 2017-10-07 LAB — MAGNESIUM: Magnesium: 2.1 mg/dL (ref 1.7–2.4)

## 2017-10-07 MED ORDER — ACETAMINOPHEN 325 MG PO TABS: 325.0000 mg | ORAL_TABLET | ORAL | Status: DC | PRN

## 2017-10-07 MED ORDER — ACETAMINOPHEN 650 MG RE SUPP: 650.0000 mg | Freq: Four times a day (QID) | RECTAL | Status: DC | PRN

## 2017-10-07 NOTE — Progress Notes (Signed)
Physical Therapy Treatment Patient Details Name: Eugene [name] MRN: [id number] DOB: Sep 03, 1940 Today's Date: 10/07/2017    History of Present Illness Eugene [name] is a 77 y/o male with PMH significant for A. Fib, HTN, HLD, BPH, pre-diabetes, anxiety/depression, tobacco abuse and recent left thalamic ICH; who presented to ED with complaints of poor balance, vertigo, left side hearing impairment and worsening right side weakness. Patient had mild residual right upper extremity weakness after left thalamic ICH; but was pretty functional and living alone. Patient symptoms presented approx 36 hours ago and failed to improve; patient felt frustrated with the situation and attempted to kill himself by blowing his head off with his pistol. Patient's son noticed that his father was done and making reference as the end was close; he removed patient's ammunition and truncated patient's plans (as he had the chance to pulled the trigger). Son then took the gun away and took patient to ED for further evaluation and treatment.    PT Comments    Patient requires less assistance for sit to stands/transfers, able to ambulate over 100 feet without AD, but tends to drift to the left, also leans to the left while seated when performing BLE exercises.  Patient demonstrates improvement for avoiding drifting when using quad-cane, has to slow cadence and no loss of balance.  Patient tolerated sitting up at bedside with sitter in room after therapy.  Patient will benefit from continued physical therapy in hospital and recommended venue below to increase strength, balance, endurance for safe ADLs and gait.   Follow Up Recommendations  Outpatient PT;Supervision for mobility/OOB     Equipment Recommendations  Rolling walker with 5" wheels    Recommendations for Other Services       Precautions / Restrictions Precautions Precautions: Fall Restrictions Weight Bearing Restrictions: No    Mobility  Bed  Mobility Overal bed mobility: Modified Independent                Transfers Overall transfer level: Needs assistance   Transfers: Sit to/from Stand;Stand Pivot Transfers Sit to Stand: Supervision Stand pivot transfers: Supervision       General transfer comment: demonstrates improvement for sit to stands/transfers without use of an assistive device  Ambulation/Gait Ambulation/Gait assistance: Min guard Ambulation Distance (Feet): 150 Feet Assistive device: None;Quad cane Gait Pattern/deviations: Decreased step length - right;Decreased step length - left;Decreased stride length;Drifts right/left Gait velocity: decreased   General Gait Details: tends to drift to the left during ambulation without using an AD, improvement noted using quad cance, overall demonstrates improvement in balance for ambulation with less assistance   Stairs             Wheelchair Mobility    Modified Rankin (Stroke Patients Only)       Balance   Sitting-balance support: Feet supported;No upper extremity supported Sitting balance-Leahy Scale: Good     Standing balance support: No upper extremity supported;During functional activity Standing balance-Leahy Scale: Fair Standing balance comment: fair without AD, fair/good with quad-cane/RW                            Cognition Arousal/Alertness: Awake/alert Behavior During Therapy: WFL for tasks assessed/performed Overall Cognitive Status: Within Functional Limits for tasks assessed  Exercises General Exercises - Lower Extremity Long Arc Quad: Seated;Strengthening;AROM;Both;10 reps Hip Flexion/Marching: Seated;AROM;Strengthening;Both;10 reps Toe Raises: Seated;AROM;Strengthening;Both;10 reps Heel Raises: Seated;AROM;Strengthening;10 reps;Both    General Comments        Pertinent Vitals/Pain Pain Assessment: No/denies pain    Home Living                       Prior Function            PT Goals (current goals can now be found in the care plan section) Acute Rehab PT Goals Patient Stated Goal: return home  Time For Goal Achievement: 10/19/17 Potential to Achieve Goals: Good Progress towards PT goals: Progressing toward goals    Frequency    7X/week      PT Plan Current plan remains appropriate    Co-evaluation              AM-PAC PT "6 Clicks" Daily Activity  Outcome Measure  Difficulty turning over in bed (including adjusting bedclothes, sheets and blankets)?: None Difficulty moving from lying on back to sitting on the side of the bed? : None Difficulty sitting down on and standing up from a chair with arms (e.g., wheelchair, bedside commode, etc,.)?: None Help needed moving to and from a bed to chair (including a wheelchair)?: A Little Help needed walking in hospital room?: A Little Help needed climbing 3-5 steps with a railing? : A Little 6 Click Score: 21    End of Session Equipment Utilized During Treatment: Gait belt Activity Tolerance: Patient tolerated treatment well;Patient limited by fatigue Patient left: in bed;with call bell/phone within reach;with nursing/sitter in room(seated at bedside) Nurse Communication: Mobility status PT Visit Diagnosis: Unsteadiness on feet (R26.81);Other abnormalities of gait and mobility (R26.89);Muscle weakness (generalized) (M62.81)     Time: 7124-5809 PT Time Calculation (min) (ACUTE ONLY): 27 min  Charges:  $Gait Training: 8-22 mins $Therapeutic Exercise: 8-22 mins                    G Codes:       2:38 PM, 28-Oct-2017 Lonell Grandchild, MPT Physical Therapist with [facility] [phone] office [phone] mobile phone

## 2017-10-07 NOTE — Progress Notes (Signed)
PROGRESS NOTE    Eugene [name]  ION:[id number] DOB: 1940/06/12 DOA: 10/04/2017 PCP: Susy Frizzle, MD    Brief Narrative:  77 year old with past medical history relevant for paroxysmal atrial fibrillation, hypertension, hyperlipidemia, BPH, anxiety/depression, recent CVA who presented with vertigo and right upper extremity weakness and was found to have clinical stroke with negative CT and MRI and found to have fairly severe suicide attempt. Currently patient is medically stable from a stroke standpoint and pending cement per psychiatry due to his continued suicidal ideation.   Assessment & Plan:   Principal Problem:   Suicidal ideation Active Problems:   Depression   Atrial fibrillation (HCC)   Diabetes mellitus type 2, noninsulin dependent ([place])   Hyperlipidemia   Occlusion and stenosis of basilar artery   Thalamic hemorrhage (HCC)   Right hemiparesis (HCC)   Stage 2 chronic kidney disease   TIA involving basilar artery   Suicidal behavior with attempted self-injury [facility])   Palliative care by specialist   Goals of care, counseling/discussion   DNR (do not resuscitate) discussion   #) TIA/recurrent stroke: Patient's vertiginous symptoms are likely due to subclinical stroke that is not picked up on MRI.  Per neurology will continue full dose aspirin and then transition to aspirin 81 and apixaban which patient was on for A. fib. -Continue aspirin 325 mg until 10/12/2017 then transition to aspirin 81 mg and apixaban 5 mg twice daily -Medically stable for psychiatry evaluation (see below)  #) Suicidal ideation: Patient continues to endorse suicidal ideation or reportedly "jokes about it". Kennon Holter -Psychiatry consult  #) Prior hemorrhagic stroke: Patient was noted to have  intracranial hemorrhage of the thalamus.  This was attributed to his rivaroxaban and he was transitioned to apixaban. - Hold apixaban 5 mg twice daily until 10/12/2017 -Continue full dose aspirin  #)  Paroxysmal atrial fibrillation: -Per above continue full dose aspirin -Hold apixaban until 10/12/2017  #)hypertension/hyperlipidemia: -Continue amlodipine 10 mg daily -Continue HCTZ 12.5 mg daily -Continue lisinopril 20 mg daily -Continue rosuvastatin 20 mg daily  #) BPH: -Continue finasteride 5 mg daily  #) Psych: -Continue fluoxetine 20 mg daily  #) Coronary artery disease status post stenting: Patient's last stent was put in mid LAD with drug-eluting stent in 06/26/2015.  He was noted to have a patent PDA stent with minimal restenosis.  He also had a balloon angioplasty of a stenosed diagonal branch. -Continue aspirin -Patient cannot tolerate beta-blocker due to heart rate -Continue statin  Fluids: Tolerating p.o. Electrolyte: Monitor and supplement Nutrition: Heart healthy diet  Prophylaxis: Enoxaparin  DO NOT RESUSCITATE  Consultants:   Palliative care  Neurology  Psychiatry  Procedures:   None  Antimicrobials:   None   Subjective: Patient reports he is feeling better.  He denies any suicidal ideation to this Probation officer however he is being surreptitious about it as he reported that he would want a gun when requested if he want anything else.  He otherwise denies any chest pain, nausea, vomiting, syncope/presyncope.  Objective: Vitals:   10/06/17 0619 10/06/17 1328 10/06/17 2113 10/07/17 0528  BP: 129/85 (!) 143/86 133/75 (!) 107/49  Pulse: (!) 51 70 64 (!) 56  Resp:  16 16 20   Temp: 97.9 F (36.6 C) 98 F (36.7 C) 98 F (36.7 C) 97.7 F (36.5 C)  TempSrc:  Other (Comment) Oral Oral  SpO2: 98% 99% 97% 100%  Weight:      Height:        Intake/Output Summary (Last 24 hours)  at 10/07/2017 1029 Last data filed at 10/06/2017 2020 Gross per 24 hour  Intake 480 ml  Output -  Net 480 ml   Filed Weights   10/04/17 0447 10/04/17 1603  Weight: 86.2 kg (190 lb) 86.2 kg (190 lb)    Examination:  General exam: Appears calm and comfortable  Respiratory  system: Clear to auscultation. Respiratory effort normal. Cardiovascular system: Irregularly irregular, no murmur Gastrointestinal system: Abdomen is nondistended, soft and nontender. No organomegaly or masses felt. Normal bowel sounds heard.  Central nervous system: Alert and oriented. No focal neurological deficits.  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, rhinophyma Psychiatry: Poor judgment and insight, mood appears depressed, currently reports some suicidal ideation but denies any homicidal ideation    Data Reviewed: I have personally reviewed following labs and imaging studies  CBC: Recent Labs  Lab 10/04/17 0547 10/05/17 0430  WBC 17.9* 8.2  NEUTROABS 15.8  --   HGB 15.2 14.0  HCT 46.7 43.7  MCV 91.9 92.0  PLT 192 865   Basic Metabolic Panel: Recent Labs  Lab 10/04/17 0547 10/04/17 2155 10/05/17 0430 10/07/17 0547  NA 139  --  138 140  K 4.1  --  3.5 3.6  CL 101  --  103 102  CO2 28  --  27 31  GLUCOSE 139*  --  97 117*  BUN 24*  --  25* 27*  CREATININE 1.17  --  1.16 1.43*  CALCIUM 9.6  --  9.0 9.1  MG  --  1.9  --  2.1  PHOS  --  3.2  --   --    GFR: Estimated Creatinine Clearance: 46.7 mL/min (A) (by C-G formula based on SCr of 1.43 mg/dL (H)). Liver Function Tests: Recent Labs  Lab 10/04/17 0547  AST 16  ALT 17  ALKPHOS 63  BILITOT 0.8  PROT 7.1  ALBUMIN 3.9   No results for input(s): LIPASE, AMYLASE in the last 168 hours. No results for input(s): AMMONIA in the last 168 hours. Coagulation Profile: Recent Labs  Lab 10/04/17 0547  INR 1.20   Cardiac Enzymes: Recent Labs  Lab 10/04/17 0547 10/04/17 0904  TROPONINI 0.12* 0.18*   BNP (last 3 results) No results for input(s): PROBNP in the last 8760 hours. HbA1C: No results for input(s): HGBA1C in the last 72 hours. CBG: Recent Labs  Lab 10/06/17 0833  10/06/17 1206 10/06/17 1619 10/06/17 2106 10/07/17 0743  GLUCAP 117* 114* 118* 150* 79   Lipid Profile: No results for input(s): CHOL, HDL, LDLCALC, TRIG, CHOLHDL, LDLDIRECT in the last 72 hours. Thyroid Function Tests: No results for input(s): TSH, T4TOTAL, FREET4, T3FREE, THYROIDAB in the last 72 hours. Anemia Panel: No results for input(s): VITAMINB12, FOLATE, FERRITIN, TIBC, IRON, RETICCTPCT in the last 72 hours. Sepsis Labs: No results for input(s): PROCALCITON, LATICACIDVEN in the last 168 hours.  No results found for this or any previous visit (from the past 240 hour(s)).       Radiology Studies: No results found.      Scheduled Meds: . amLODipine  10 mg Oral Daily  . aspirin EC  325 mg Oral Daily  . enoxaparin (LOVENOX) injection  40 mg Subcutaneous Q24H  . finasteride  5 mg Oral Daily  . FLUoxetine  20 mg Oral Daily  . hydrochlorothiazide  12.5 mg Oral Daily  . insulin aspart  0-9 Units  Subcutaneous TID WC  . lisinopril  20 mg Oral Daily  . meclizine  12.5 mg Oral TID  . multivitamin with minerals  1 tablet Oral Daily  . nicotine  14 mg Transdermal Daily  . pantoprazole  40 mg Oral Daily  . rosuvastatin  20 mg Oral Daily   Continuous Infusions:   LOS: 3 days    Time spent: [place], MD [facility]  If 7PM-7AM, please contact night-coverage [email or web address] Password [facility] 10/07/2017, 10:29 AM

## 2017-10-07 NOTE — Progress Notes (Signed)
Rounded on patient. Sitter A. Hairston CPD and friend at bedside. Patient requesting information on next steps and verbalizing that he wants to end his life as he can no longer care for himself. Friend/neighbor states patient is very independent and has told him he does not want to burden anyone with caring for him. Patient expresses hopelessness about his condition as he said the physicians have told him he has had a stroke and is likely to have another one due to his blood pressure issues and blockages in his brain. He said he wants to end it all.

## 2017-10-07 NOTE — Progress Notes (Signed)
Pt. meets criteria for inpatient treatment per Marvia Pickles, NP.  Referred out to the following hospitals:  [facility]  [facility]       Disposition CSW will continue to follow for placement.  Areatha Keas. Judi Cong, MSW, [place] Disposition Clinical Social Work [phone] (cell) [phone] (office)

## 2017-10-07 NOTE — Progress Notes (Signed)
OT Cancellation Note  Patient Details Name: JAMION CARTER MRN: [id number] DOB: 1940-08-01   Cancelled Treatment:    Reason Eval/Treat Not Completed: OT screened, no needs identified, will sign off. Pt continues to be hesitant in participation, reporting it won't do any good. Pt screened for OT needs, BUE strength and coordination appears [facility] during mobility tasks. Primary limiting factor is dizziness affecting balance during standing tasks and functional mobility. Pt is able to complete ADLs when he so chooses. At this time no further acute OT services required, however pt is not safe to return home alone due to current SI with plan. Per chart review pt is undergoing psych evaluations. Defer placement recommendations to PT.    Guadelupe Sabin, OTR/L  [phone] 10/07/2017, 7:54 AM

## 2017-10-08 LAB — GLUCOSE, CAPILLARY
GLUCOSE-CAPILLARY: 130 mg/dL — AB (ref 65–99)
GLUCOSE-CAPILLARY: 121 mg/dL — AB (ref 65–99)
Glucose-Capillary: 90 mg/dL (ref 65–99)

## 2017-10-08 LAB — BASIC METABOLIC PANEL
BUN: 27 mg/dL — ABNORMAL HIGH (ref 6–20)
CO2: 29 mmol/L (ref 22–32)
Calcium: 9.2 mg/dL (ref 8.9–10.3)
Creatinine, Ser: 1.23 mg/dL (ref 0.61–1.24)
Glucose, Bld: 129 mg/dL — ABNORMAL HIGH (ref 65–99)
Potassium: 3.8 mmol/L (ref 3.5–5.1)

## 2017-10-08 LAB — BASIC METABOLIC PANEL WITH GFR
Anion gap: 8 (ref 5–15)
Chloride: 100 mmol/L — ABNORMAL LOW (ref 101–111)
GFR calc Af Amer: >60 mL/min (ref >60)
GFR calc non Af Amer: 54 mL/min — ABNORMAL LOW (ref >60)
Sodium: 137 mmol/L (ref 135–145)

## 2017-10-08 NOTE — Progress Notes (Signed)
PROGRESS NOTE    Eugene [name]  PZW:[id number] DOB: November 13, 1940 DOA: 10/04/2017 PCP: Susy Frizzle, MD    Brief Narrative:  77 year old with past medical history relevant for paroxysmal atrial fibrillation, hypertension, hyperlipidemia, BPH, anxiety/depression, recent CVA who presented with vertigo and right upper extremity weakness and was found to have clinical stroke with negative CT and MRI and found to have fairly severe suicide attempt. Currently patient is medically stable from a stroke standpoint and pending cement per psychiatry due to his continued suicidal ideation.   Assessment & Plan:   Principal Problem:   Suicidal ideation Active Problems:   Depression   Atrial fibrillation (HCC)   Diabetes mellitus type 2, noninsulin dependent ([place])   Hyperlipidemia   Occlusion and stenosis of basilar artery   Thalamic hemorrhage (HCC)   Right hemiparesis (HCC)   Stage 2 chronic kidney disease   TIA involving basilar artery   Suicidal behavior with attempted self-injury [facility])   Palliative care by specialist   Goals of care, counseling/discussion   DNR (do not resuscitate) discussion   #) TIA/recurrent stroke: Patient's vertiginous symptoms are likely due to subclinical stroke that is not picked up on MRI.  Per neurology will continue full dose aspirin and then transition to aspirin 81 and apixaban which patient was on for A. fib. -Continue aspirin 325 mg until 10/12/2017 then transition to aspirin 81 mg and apixaban 5 mg twice daily -Medically stable for psychiatry evaluation (see below)  #) Suicidal ideation: Patient continues to endorse suicidal ideation or reportedly "jokes about it".   Kennon Holter -Psychiatry consult, recommends inpatient psychiatry treatment, pending placement  #) Prior hemorrhagic stroke: Patient was noted to have  intracranial hemorrhage of the thalamus.  This was attributed to his rivaroxaban and he was transitioned to apixaban. - Hold apixaban 5  mg twice daily until 10/12/2017 -Continue full dose aspirin  #) Paroxysmal atrial fibrillation: -Per above continue full dose aspirin -Hold apixaban until 10/12/2017  #)hypertension/hyperlipidemia: -Continue amlodipine 10 mg daily -Continue HCTZ 12.5 mg daily -Continue lisinopril 20 mg daily -Continue rosuvastatin 20 mg daily  #) BPH: -Continue finasteride 5 mg daily  #) Psych: -Continue fluoxetine 20 mg daily  #) Coronary artery disease status post stenting: Patient's last stent was put in mid LAD with drug-eluting stent in 06/26/2015.  He was noted to have a patent PDA stent with minimal restenosis.  He also had a balloon angioplasty of a stenosed diagonal branch. -Continue aspirin -Patient cannot tolerate beta-blocker due to heart rate -Continue statin  Fluids: Tolerating p.o. Electrolyte: Monitor and supplement Nutrition: Heart healthy diet  Prophylaxis: Enoxaparin  DO NOT RESUSCITATE  Consultants:   Palliative care  Neurology  Psychiatry  Procedures:   None  Antimicrobials:   None   Subjective: Patient reports that he is quite upset about the situation.  He reports he is ready to die that he will stop taking medications that are prescribed to him.  Informed the patient that this would not be acceptable at this time due to his active suicidal ideation.  He otherwise denies any hallucinations or delusions, chest pain, orthopnea, lower extremity edema, shortness of breath.  Objective: Vitals:   10/06/17 2113 10/07/17 0528 10/07/17 1451 10/07/17 2148  BP:  (!) 107/49 126/79 113/70  Pulse: 64 (!) 56 (!) 58 (!) 57  Resp: 16 20 16 16   Temp: 98 F (36.7 C) 97.7 F (36.5 C) 97.8 F (36.6 C) 98.1 F (36.7 C)  TempSrc: Oral Oral Oral Oral  SpO2:  97% 100% 100% 98%  Weight:      Height:        Intake/Output Summary (Last 24 hours) at 10/08/2017 0915 Last data filed at 10/08/2017 0845 Gross per 24 hour  Intake 720 ml  Output -  Net 720 ml   Filed Weights    10/04/17 0447 10/04/17 1603  Weight: 86.2 kg (190 lb) 86.2 kg (190 lb)    Examination:  General exam: Appears calm and comfortable  Respiratory system: Clear to auscultation. Respiratory effort normal. Cardiovascular system: Irregularly irregular, no murmur Gastrointestinal system: Abdomen is nondistended, soft and nontender. No organomegaly or masses felt. Normal bowel sounds heard.  Central nervous system: Alert and oriented. No focal neurological deficits.  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, rhinophyma Psychiatry: Poor judgment and insight, mood appears depressed, continues to report suicidal ideation    Data Reviewed: I have personally reviewed following labs and imaging studies  CBC: Recent Labs  Lab 10/04/17 0547 10/05/17 0430  WBC 17.9* 8.2  NEUTROABS 15.8  --   HGB 15.2 14.0  HCT 46.7 43.7  MCV 91.9 92.0  PLT 192 622   Basic Metabolic Panel: Recent Labs  Lab 10/04/17 0547 10/04/17 2155 10/05/17 0430 10/07/17 0547  NA 139  --  138 140  K 4.1  --  3.5 3.6  CL 101  --  103 102  CO2 28  --  27 31  GLUCOSE 139*  --  97 117*  BUN 24*  --  25* 27*  CREATININE 1.17  --  1.16 1.43*  CALCIUM 9.6  --  9.0 9.1  MG  --  1.9  --  2.1  PHOS  --  3.2  --   --    GFR: Estimated Creatinine Clearance: 46.7 mL/min (A) (by C-G formula based on SCr of 1.43 mg/dL (H)). Liver Function Tests: Recent Labs  Lab 10/04/17 0547  AST 16  ALT 17  ALKPHOS 63  BILITOT 0.8  PROT 7.1  ALBUMIN 3.9   No results for input(s): LIPASE, AMYLASE in the last 168 hours. No results for input(s): AMMONIA in the last 168 hours. Coagulation Profile: Recent Labs  Lab 10/04/17 0547  INR 1.20   Cardiac Enzymes: Recent Labs  Lab 10/04/17 0547 10/04/17 0904  TROPONINI 0.12* 0.18*   BNP (last 3 results) No results for input(s): PROBNP in the last 8760  hours. HbA1C: No results for input(s): HGBA1C in the last 72 hours. CBG: Recent Labs  Lab 10/06/17 2106 10/07/17 0743 10/07/17 1111 10/07/17 1657 10/07/17 2146  GLUCAP 150* 79 110* 84 107*   Lipid Profile: No results for input(s): CHOL, HDL, LDLCALC, TRIG, CHOLHDL, LDLDIRECT in the last 72 hours. Thyroid Function Tests: No results for input(s): TSH, T4TOTAL, FREET4, T3FREE, THYROIDAB in the last 72 hours. Anemia Panel: No results for input(s): VITAMINB12, FOLATE, FERRITIN, TIBC, IRON, RETICCTPCT in the last 72 hours. Sepsis Labs: No results for input(s): PROCALCITON, LATICACIDVEN in the last 168 hours.  No results found for this or any previous visit (from the past 240 hour(s)).       Radiology Studies: No results found.      Scheduled Meds: . amLODipine  10 mg Oral Daily  . aspirin EC  325 mg Oral Daily  . enoxaparin (LOVENOX) injection  40 mg Subcutaneous Q24H  . finasteride  5 mg Oral Daily  . FLUoxetine  20 mg Oral Daily  . hydrochlorothiazide  12.5 mg Oral Daily  . insulin aspart  0-9 Units Subcutaneous TID WC  . lisinopril  20 mg Oral Daily  . meclizine  12.5 mg Oral TID  . multivitamin with minerals  1 tablet Oral Daily  . nicotine  14 mg Transdermal Daily  . pantoprazole  40 mg Oral Daily  . rosuvastatin  20 mg Oral Daily   Continuous Infusions:   LOS: 4 days    Time spent: [place], MD [facility]  If 7PM-7AM, please contact night-coverage [email or web address] Password [email or web address] 10/08/2017, 9:15 AM

## 2017-10-08 NOTE — Care Management Important Message (Signed)
Important Message  Patient Details  Name: Eugene [name] MRN: [id number] Date of Birth: [birth year]   Medicare Important Message Given:  Yes    Shelda Altes 10/08/2017, 11:45 AM

## 2017-10-08 NOTE — Progress Notes (Signed)
Patient had refused blood sugar check this am. Stated "I'm not taking medications" earlier this am. Discussed ordered medications with patient. Stated he was agreeable to taking medications as ordered, sitter and his son at bedside as well. Patient's son, Chi Garlow at bedside, pt states he can receive updates and care can be discussed with him. Both he and patient verbalize understanding of suicide precautions, sitter at bedside. Security notified of son's arrival so wanding could be completed for safety precautions. Sitter remains at bedside. Pt has been calm and cooperative with nursing staff this am. When asked about suicidal ideations during medication administration, pt stated "it varies". Pt and son aware behavioral health is awaiting bed for him. Donavan Foil, RN

## 2017-10-08 NOTE — Progress Notes (Signed)
Sitter at bedside this am received report from RN, verbalized understanding of sitter documentation and completing sitter documentation form every 30 minutes as well as environmental monitoring for safety. Form kept with sitter at bedside. Nursing also documenting on flowsheets for times nursing has been in room with patient. Sitter changed at 1300 also received report from RN, verbalized and demonstrates understanding of sitter documentation and environmental monitoring for safety. Donavan Foil, RN

## 2017-10-08 NOTE — Progress Notes (Addendum)
Patient continues to meet critieria for inpatient treatment per TTS previous reassessment on 5/24.  Patient is on the waitlist at [facility], per [place], on 5/24.  Patient's referral has been followed up at the following IP facilities: [facility] - per intake, fax it for review. [facility] - at capacity, but accepting referrals today. Referral was faxed. [facility] - at [facility] - no answer and no option to leave [facility] - no beds today [facility] - at capacity  Patient was also referred to [facility] (per Prudence Davidson), [facility], and [facility]  CSW in disposition will continue to seek placement for patient.  Verlon Setting, MSW, [place] Clinical social worker in [facility], [place] Office [phone] and [phone] 10/08/2017 10:32 AM

## 2017-10-08 NOTE — Progress Notes (Signed)
Physical Therapy Treatment Patient Details Name: Eugene [name] MRN: [id number] DOB: [birth year] Today's Date: 10/08/2017    History of Present Illness Eugene [name] is a 77 y/o male with PMH significant for A. Fib, HTN, HLD, BPH, pre-diabetes, anxiety/depression, tobacco abuse and recent left thalamic ICH; who presented to ED with complaints of poor balance, vertigo, left side hearing impairment and worsening right side weakness. Patient had mild residual right upper extremity weakness after left thalamic ICH; but was pretty functional and living alone. Patient symptoms presented approx 36 hours ago and failed to improve; patient felt frustrated with the situation and attempted to kill himself by blowing his head off with his pistol. Patient's son noticed that his father was done and making reference as the end was close; he removed patient's ammunition and truncated patient's plans (as he had the chance to pulled the trigger). Son then took the gun away and took patient to ED for further evaluation and treatment.    PT Comments    Pt supine in bed and presents in better mood today, willing to participate with therapy today.  Pt presents with improve steadiness during gait and improved activity tolerance with increased distance today.  Minimal unsteadiness and no LOB during session.  Pt did report slight dizziness with headturns during gait.  EOS pt left sitting on EOB with sitter/son in room and ready to eat lunch.      Follow Up Recommendations  Outpatient PT;Supervision for mobility/OOB     Equipment Recommendations  Rolling walker with 5" wheels    Recommendations for Other Services       Precautions / Restrictions Precautions Precautions: Fall Restrictions Weight Bearing Restrictions: No    Mobility  Bed Mobility Overal bed mobility: Independent                Transfers Overall transfer level: Modified independent Equipment used: Rolling walker (2  wheeled) Transfers: Sit to/from Stand Sit to Stand: Supervision         General transfer comment: demonstrates improvement for sit to stands/transfers without use of an assistive device  Ambulation/Gait Ambulation/Gait assistance: Min guard Ambulation Distance (Feet): 250 Feet Assistive device: Rolling walker (2 wheeled) Gait Pattern/deviations: Decreased step length - right;Decreased step length - left;Decreased stride length;Drifts right/left     General Gait Details: improved balance ambulating wiht RW, minimal unsteadiness.  Improved activity tolerance with increased distance and less fatigue at EOS.     Stairs             Wheelchair Mobility    Modified Rankin (Stroke Patients Only)       Balance                                            Cognition Arousal/Alertness: Awake/alert Behavior During Therapy: WFL for tasks assessed/performed Overall Cognitive Status: Within Functional Limits for tasks assessed                                        Exercises      General Comments        Pertinent Vitals/Pain Pain Assessment: No/denies pain    Home Living                      Prior Function  PT Goals (current goals can now be found in the care plan section) Progress towards PT goals: Progressing toward goals    Frequency    7X/week      PT Plan Current plan remains appropriate    Co-evaluation              AM-PAC PT "6 Clicks" Daily Activity  Outcome Measure  Difficulty turning over in bed (including adjusting bedclothes, sheets and blankets)?: None Difficulty moving from lying on back to sitting on the side of the bed? : None Difficulty sitting down on and standing up from a chair with arms (e.g., wheelchair, bedside commode, etc,.)?: None Help needed moving to and from a bed to chair (including a wheelchair)?: A Little Help needed walking in hospital room?: A Little Help  needed climbing 3-5 steps with a railing? : A Little 6 Click Score: 21    End of Session Equipment Utilized During Treatment: Gait belt Activity Tolerance: Patient tolerated treatment well;Patient limited by fatigue Patient left: in bed;with call bell/phone within reach;with nursing/sitter in room;with family/visitor present(son/sitter in room.  Left sitting on EOB for lunch) Nurse Communication: Mobility status PT Visit Diagnosis: Unsteadiness on feet (R26.81);Other abnormalities of gait and mobility (R26.89);Muscle weakness (generalized) (M62.81)     Time: 7543-6067 PT Time Calculation (min) (ACUTE ONLY): 18 min  Charges:  $Gait Training: 8-22 mins                    G Codes:      Ihor Austin, [place]; CBIS [phone]  Aldona Lento 10/08/2017, 12:08 PM

## 2017-10-08 NOTE — BH Assessment (Signed)
Patient is very hard of hearing. His RN has to repeat writer's questions so he can hear. Patient continues to endorse suicidal ideation. He denies homicidal ideation and denies any type of hallucination. TTS will continue to seek geropsych placement.  Arnold Long,  Therapeutic Triage Specialist

## 2017-10-09 DIAGNOSIS — I63522 Cerebral infarction due to unspecified occlusion or stenosis of left anterior cerebral artery: Secondary | ICD-10-CM

## 2017-10-09 LAB — BASIC METABOLIC PANEL WITH GFR
CO2: 28 mmol/L (ref 22–32)
Calcium: 8.9 mg/dL (ref 8.9–10.3)
Creatinine, Ser: 1.33 mg/dL — ABNORMAL HIGH (ref 0.61–1.24)
GFR calc Af Amer: 57 mL/min — ABNORMAL LOW (ref >60)

## 2017-10-09 LAB — BASIC METABOLIC PANEL
Anion gap: 8 (ref 5–15)
BUN: 30 mg/dL — ABNORMAL HIGH (ref 6–20)
Chloride: 103 mmol/L (ref 101–111)
GFR calc non Af Amer: 50 mL/min — ABNORMAL LOW (ref >60)
Glucose, Bld: 111 mg/dL — ABNORMAL HIGH (ref 65–99)
Potassium: 3.8 mmol/L (ref 3.5–5.1)
Sodium: 139 mmol/L (ref 135–145)

## 2017-10-09 LAB — GLUCOSE, CAPILLARY
GLUCOSE-CAPILLARY: 103 mg/dL — AB (ref 65–99)
Glucose-Capillary: 106 mg/dL — ABNORMAL HIGH (ref 65–99)
Glucose-Capillary: 125 mg/dL — ABNORMAL HIGH (ref 65–99)
Glucose-Capillary: 103 mg/dL — ABNORMAL HIGH (ref 65–99)

## 2017-10-09 NOTE — Progress Notes (Addendum)
Physical Therapy Treatment Patient Details Name: Eugene [name] MRN: [id number] DOB: 08/06/1940 Today's Date: 10/09/2017    History of Present Illness Eugene [name] is a 77 y/o male with PMH significant for A. Fib, HTN, HLD, BPH, pre-diabetes, anxiety/depression, tobacco abuse and recent left thalamic ICH; who presented to ED with complaints of poor balance, vertigo, left side hearing impairment and worsening right side weakness. Patient had mild residual right upper extremity weakness after left thalamic ICH; but was pretty functional and living alone. Patient symptoms presented approx 36 hours ago and failed to improve; patient felt frustrated with the situation and attempted to kill himself by blowing his head off with his pistol. Patient's son noticed that his father was done and making reference as the end was close; he removed patient's ammunition and truncated patient's plans (as he had the chance to pulled the trigger). Son then took the gun away and took patient to ED for further evaluation and treatment.    PT Comments    Patient was found awake and alert sitting up next to sitter in room upon entering. Patient performed sit to stand with rolling walker. Patient ambulated 300 feet with intermittent use of rolling walker and without rolling walker. During one bout without rolling walker patient lost balance requiring moderate assistance of therapist at gait belt to maintain balance. With rolling walker patient demonstrated a drift to the left with ambulation. Patient was returned to room into chair beside sitter. Patient's nurse was notified of patient's performance this session and told that patient requested to walk more throughout the day. Patient would benefit from continued skilled physical therapy in order to continue progression with gait training, balance, and overall functional mobility.    Follow Up Recommendations  Outpatient PT;Supervision for mobility/OOB      Equipment Recommendations  Rolling walker with 5" wheels    Recommendations for Other Services       Precautions / Restrictions Precautions Precautions: Fall Restrictions Weight Bearing Restrictions: No    Mobility  Bed Mobility Overal bed mobility: Independent                Transfers Overall transfer level: Modified independent Equipment used: Rolling walker (2 wheeled) Transfers: Sit to/from Stand Sit to Stand: Supervision         General transfer comment: Patient used rolling walker for sit to stand transfer, but not for stand to sit transfer.   Ambulation/Gait Ambulation/Gait assistance: Min guard Ambulation Distance (Feet): 300 Feet Assistive device: Rolling walker (2 wheeled) Gait Pattern/deviations: Decreased step length - right;Decreased step length - left;Decreased stride length;Drifts right/left     General Gait Details: improved balance ambulating wiht RW, minimal unsteadiness. Patient demonstrated a drift toward the left side when not using the walker and 1 LOB.    Stairs             Wheelchair Mobility    Modified Rankin (Stroke Patients Only)       Balance Overall balance assessment: Independent Sitting-balance support: Feet supported;No upper extremity supported Sitting balance-Leahy Scale: Good     Standing balance support: No upper extremity supported;During functional activity Standing balance-Leahy Scale: Fair Standing balance comment: fair without AD, fair/good with RW                            Cognition Arousal/Alertness: Awake/alert Behavior During Therapy: WFL for tasks assessed/performed Overall Cognitive Status: Within Functional Limits for tasks assessed  Exercises      General Comments        Pertinent Vitals/Pain Pain Assessment: No/denies pain    Home Living                      Prior Function            PT Goals  (current goals can now be found in the care plan section) Progress towards PT goals: Progressing toward goals    Frequency    7X/week      PT Plan Current plan remains appropriate    Co-evaluation              AM-PAC PT "6 Clicks" Daily Activity  Outcome Measure  Difficulty turning over in bed (including adjusting bedclothes, sheets and blankets)?: None Difficulty moving from lying on back to sitting on the side of the bed? : None Difficulty sitting down on and standing up from a chair with arms (e.g., wheelchair, bedside commode, etc,.)?: None Help needed moving to and from a bed to chair (including a wheelchair)?: A Little Help needed walking in hospital room?: A Little Help needed climbing 3-5 steps with a railing? : A Little 6 Click Score: 21    End of Session Equipment Utilized During Treatment: Gait belt Activity Tolerance: Patient tolerated treatment well Patient left: with call bell/phone within reach;with nursing/sitter in room;in chair(sitter in room.  Left sitting in chair) Nurse Communication: Mobility status PT Visit Diagnosis: Unsteadiness on feet (R26.81);Other abnormalities of gait and mobility (R26.89);Muscle weakness (generalized) (M62.81)     Time: 7096-2836 PT Time Calculation (min) (ACUTE ONLY): 18 min  Charges:  $Gait Training: 8-22 mins                    G Codes:      Clarene Critchley PT, DPT 11:47 AM, 10/09/17 [phone]

## 2017-10-09 NOTE — BH Assessment (Signed)
Contacted pt to determine if he continues to meet criteria for inpatient hospitalization. Pt shares he is feeling "great." He shares he slept well last night and that he has been eating "too much." He states he was somewhat depressed yesterday because he has lost his hearing, which makes it difficult to hear anything. He states that he believes that he is still somewhat depressed because he is in the hospital, though he thinks that he is "handling it pretty well." Pt denies he is feeling suicidal and or that he wants to hurt himself. He denies any desire to harm anyone else.  Due to pt's current dramatic change in thoughts of suicide over the last 24 hours and concerns that he has learned how to answer the questions posed instead of actually feeling better, Marvia Pickles NP determined pt should be held for a minimum of an additional 24 hours to ensure he continues to maintain he is no longer suicidal before being discharged; this is to ensure pt's safety and stability.

## 2017-10-09 NOTE — Progress Notes (Signed)
PROGRESS NOTE                                                                                                                                                                                                             Patient Demographics:    Eugene [name], is a 77 y.o. male, DOB - 04-03-1941, ZOX:[id number]  Admit date - 10/04/2017   Admitting Physician Barton Dubois, MD  Outpatient Primary MD for the patient is [name], [name], MD  LOS - 5  Outpatient Specialists: none  Chief Complaint  Patient presents with  . V70.1       Brief Narrative   77 year old male with A. fib, hypertension, BPH, anxiety/depression, tobacco abuse, recent thalamic ICH (6 weeks back) presented to the ED with vertigo, poor balance and left-sided hearing impairment with worsening right-sided weakness.  After his recent left thalamic ICH he had mild residual right upper extremity weakness.  Patient symptoms occurred about 36 hours prior to presentation and due to ongoing symptoms he became frustrated and attempted to kill himself by blowing his head off with a pistol.  Patient son noticed this and intervened and brought patient to the ED.  CT head and MRI brain negative for acute findings but given his clinical presentation neurology recommended this is likely acute infarction of the ipsilateral AICA causing acute unilateral hearing loss and vertigo.    Subjective:   Patient denied being suicidal today. C/o left sided hearing loss. No overnight events.   Assessment  & Plan :   Principal problem Acute infarction of AICA Symptoms of vertigo associated with unilateral hearing loss likely due to acute infarction of ipsilateral AICA per neurology which usually is not picked up on MRI. Had hemorrhagic ICH 6 weeks.  Neurology recommends full dose aspirin until 5/28 and then transition to 81 mg aspirin and Eliquis 5 mg twice daily.  Further stroke  work-up not needed.  Outpatient PT Continue statin.  Depression with suicidal ideation. Patient denies suicidal ideation today but still feels depressed.  Telemetry psych concerned that given his dramatic response of not being suicidal he needs to be monitored for another 24 hours to ensure that he continuously feels not to be suicidal.  Patient is waiting for [facility] psych facility. Continue fluoxetine.  Intracranial hemorrhage of the left thalamus on 4/10. Patient was on Xarelto prior  to this and was transitioned to apixaban.  Holding apixaban until 5/28 and currently on full dose aspirin.  Paroxysmal A. fib Rate controlled on the monitor.  On full dose aspirin, holding apixaban for 1 week.  After 5/28 he would be on baby aspirin and apixaban.   Essential hypertension Continue amlodipine, HCTZ and lisinopril.  Coronary artery disease with history of LAD stenting in 2017 Continue aspirin and statin.  Intolerant to beta-blocker due to bradycardia.  Code Status : DNR  Family Communication  : None at bedside  Disposition Plan awaiting [facility] psych bed.  TTS to reevaluate tomorrow  Barriers For Discharge : Awaiting disposition  Consults  : Neurology, palliative care, TTS  Procedures  : CT head, MRI brain  DVT Prophylaxis  : Lovenox  Lab Results  Component Value Date   PLT 185 10/05/2017    Antibiotics  :    Anti-infectives (From admission, onward)   None        Objective:   Vitals:   10/08/17 1113 10/08/17 1440 10/08/17 2150 10/09/17 0537  BP: 138/76 104/69 (!) 146/80 120/77  Pulse: (!) 41 74 62 65  Resp: 20 15 16 16   Temp: 98.3 F (36.8 C) 98.1 F (36.7 C) 98.4 F (36.9 C) 98.3 F (36.8 C)  TempSrc: Oral Oral Oral Oral  SpO2: 100% 96% 98% 97%  Weight:      Height:        Wt Readings from Last 3 Encounters:  10/04/17 86.2 kg (190 lb)  09/20/17 86.2 kg (190 lb)  09/09/17 84.6 kg (186 lb 8.2 oz)     Intake/Output Summary (Last 24 hours) at 10/09/2017  1357 Last data filed at 10/09/2017 1230 Gross per 24 hour  Intake 1080 ml  Output -  Net 1080 ml     Physical Exam  Gen: not in distress HEENT: , moist mucosa, supple neck Chest: clear b/l, no added sounds CVS: S1 and S2 irregular, no murmurs GI: soft, NT, ND,  Musculoskeletal: warm, no edema CNS: Alert and oriented, hard of hearing, mild rt upper weakness    Data Review:    CBC Recent Labs  Lab 10/04/17 0547 10/05/17 0430  WBC 17.9* 8.2  HGB 15.2 14.0  HCT 46.7 43.7  PLT 192 185  MCV 91.9 92.0  MCH 29.9 29.5  MCHC 32.5 32.0  RDW 14.4 14.6  LYMPHSABS 1.2  --   MONOABS 1.2  --   EOSABS 0.0  --   BASOSABS 0.0  --     Chemistries  Recent Labs  Lab 10/04/17 0547 10/04/17 2155 10/05/17 0430 10/07/17 0547 10/08/17 0853 10/09/17 0639  NA 139  --  138 140 137 139  K 4.1  --  3.5 3.6 3.8 3.8  CL 101  --  103 102 100* 103  CO2 28  --  27 31 29 28   GLUCOSE 139*  --  97 117* 129* 111*  BUN 24*  --  25* 27* 27* 30*  CREATININE 1.17  --  1.16 1.43* 1.23 1.33*  CALCIUM 9.6  --  9.0 9.1 9.2 8.9  MG  --  1.9  --  2.1  --   --   AST 16  --   --   --   --   --   ALT 17  --   --   --   --   --   ALKPHOS 63  --   --   --   --   --  BILITOT 0.8  --   --   --   --   --    ------------------------------------------------------------------------------------------------------------------ No results for input(s): CHOL, HDL, LDLCALC, TRIG, CHOLHDL, LDLDIRECT in the last 72 hours.  Lab Results  Component Value Date   HGBA1C 6.4 (H) 08/25/2017   ------------------------------------------------------------------------------------------------------------------ No results for input(s): TSH, T4TOTAL, T3FREE, THYROIDAB in the last 72 hours.  Invalid input(s): FREET3 ------------------------------------------------------------------------------------------------------------------ No results for input(s): VITAMINB12, FOLATE, FERRITIN, TIBC, IRON, RETICCTPCT in the last 72  hours.  Coagulation profile Recent Labs  Lab 10/04/17 0547  INR 1.20    No results for input(s): DDIMER in the last 72 hours.  Cardiac Enzymes Recent Labs  Lab 10/04/17 0547 10/04/17 0904  TROPONINI 0.12* 0.18*   ------------------------------------------------------------------------------------------------------------------ No results found for: BNP  Inpatient Medications  Scheduled Meds: . amLODipine  10 mg Oral Daily  . aspirin EC  325 mg Oral Daily  . enoxaparin (LOVENOX) injection  40 mg Subcutaneous Q24H  . finasteride  5 mg Oral Daily  . FLUoxetine  20 mg Oral Daily  . hydrochlorothiazide  12.5 mg Oral Daily  . insulin aspart  0-9 Units Subcutaneous TID WC  . lisinopril  20 mg Oral Daily  . meclizine  12.5 mg Oral TID  . multivitamin with minerals  1 tablet Oral Daily  . nicotine  14 mg Transdermal Daily  . pantoprazole  40 mg Oral Daily  . rosuvastatin  20 mg Oral Daily   Continuous Infusions: PRN Meds:.acetaminophen **OR** acetaminophen, ondansetron **OR** ondansetron (ZOFRAN) IV  Micro Results No results found for this or any previous visit (from the past 240 hour(s)).  Radiology Reports Ct Head Wo Contrast  Result Date: 10/04/2017 CLINICAL DATA:  Initial evaluation for persistent vertigo, recent stroke. EXAM: CT HEAD WITHOUT CONTRAST TECHNIQUE: Contiguous axial images were obtained from the base of the skull through the vertex without intravenous contrast. COMPARISON:  Prior CT from 09/10/2017. FINDINGS: Brain: Continued normal expected interval evolution of previously seen left thalamic hematoma, now largely chronic in appearance. Atrophy with chronic small vessel ischemic change, similar to previous. Remote left cerebellar infarct with dystrophic calcification. Remote right basal ganglia lacunar infarct noted as well. No acute large vessel territory infarct. No acute intracranial hemorrhage. No mass lesion, midline shift or mass effect. No hydrocephalus.  No extra-axial fluid collection. Vascular: Intracranial atherosclerotic change. No hyperdense vessel. Skull: Scalp soft tissues and calvarium demonstrate no acute abnormality. Sinuses/Orbits: Globes and orbital soft tissues demonstrate no acute finding. Scattered mucosal thickening throughout the ethmoidal air cells. Paranasal sinuses are otherwise largely clear. No mastoid effusion. Other: None. IMPRESSION: 1. No acute intracranial abnormality. 2. Normal interval evolution of recently identified left thalamic hematoma, now largely chronic in appearance. 3. Atrophy with chronic small vessel ischemic disease with additional scatter remote infarcts as above. Electronically Signed   By: Jeannine Boga M.D.   On: 10/04/2017 06:59   Ct Head Wo Contrast  Result Date: 09/10/2017 CLINICAL DATA:  Intracranial hemorrhage follow-up EXAM: CT HEAD WITHOUT CONTRAST TECHNIQUE: Contiguous axial images were obtained from the base of the skull through the vertex without intravenous contrast. COMPARISON:  08/27/2017 FINDINGS: Brain: Decreasing density of the left thalamic hematoma. High-density only encompasses a 9 mm area today. There is associated swelling, which is expected, with mild encroachment on the third ventricle. No evidence of acute or interval hemorrhage. No hydrocephalus or collection. Advanced chronic small vessel ischemic type change in the cerebral white matter. 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 Vascular: Atherosclerotic  calcification. Skull: No acute or aggressive finding Sinuses/Orbits: Patchy mucosal thickening in the paranasal sinuses. Bilateral cataract resection. IMPRESSION: 1. Expected evolution of subacute left thalamic hematoma. No new abnormality. 2. Advanced chronic small vessel ischemia. Electronically Signed   By: Monte Fantasia M.D.   On: 09/10/2017 16:41   Mr Jodene Nam Head Wo Contrast  Result Date: 10/04/2017 CLINICAL DATA:  Vertigo, persistent, central EXAM: MRI HEAD WITHOUT  CONTRAST MRA HEAD WITHOUT CONTRAST TECHNIQUE: Multiplanar, multiecho pulse sequences of the brain and surrounding structures were obtained without intravenous contrast. Angiographic images of the head were obtained using MRA technique without contrast. COMPARISON:  08/25/2017 FINDINGS: MRI HEAD FINDINGS Brain: Expected evolution of subacute hematoma in the left thalamus. No mass effect seen today. There is extensive chronic small vessel ischemic gliosis in the cerebral white matter. Remote small vessel infarct in the left cerebellum in bilateral basal ganglia. Generalized atrophy. No acute infarct, hemorrhage, hydrocephalus, or masslike finding. Vascular: Arterial findings below. Normal dural venous sinus flow voids. Skull and upper cervical spine: Negative for marrow lesion Sinuses/Orbits: Bilateral cataract resection. 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 There is poor flow seen beyond the left P2 segment, which could be related to direction of flow or new stenosis. No associated occipital infarct. There is poor signal in the left anterior temporal branch which is likely due to direction of flow, also seen previously. Mild to moderate narrowing of the right MCA. Mild narrowing of the left A1 segment. IMPRESSION: Brain MRI: 1. No acute finding. 2. Expected evolution of subacute left thalamic hematoma. 3. Advanced chronic small vessel disease. Intracranial MRA: 1. 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 2. New poor flow related signal beyond the left P2 segment which could be artifactual or interval stenosis. Electronically Signed   By: Monte Fantasia M.D.   On: 10/04/2017 08:32   Mr Brain Wo Contrast  Result Date: 10/04/2017 CLINICAL DATA:  Vertigo, persistent, central EXAM: MRI HEAD WITHOUT CONTRAST MRA HEAD WITHOUT CONTRAST TECHNIQUE: Multiplanar, multiecho pulse  sequences of the brain and surrounding structures were obtained without intravenous contrast. Angiographic images of the head were obtained using MRA technique without contrast. COMPARISON:  08/25/2017 FINDINGS: MRI HEAD FINDINGS Brain: Expected evolution of subacute hematoma in the left thalamus. No mass effect seen today. There is extensive chronic small vessel ischemic gliosis in the cerebral white matter. Remote small vessel infarct in the left cerebellum in bilateral basal ganglia. Generalized atrophy. No acute infarct, hemorrhage, hydrocephalus, or masslike finding. Vascular: Arterial findings below. Normal dural venous sinus flow voids. Skull and upper cervical spine: Negative for marrow lesion Sinuses/Orbits: Bilateral cataract resection. 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 There is poor flow seen beyond the left P2 segment, which could be related to direction of flow or new stenosis. No associated occipital infarct. There is poor signal in the left anterior temporal branch which is likely due to direction of flow, also seen previously. Mild to moderate narrowing of the right MCA. Mild narrowing of the left A1 segment. IMPRESSION: Brain MRI: 1. No acute finding. 2. Expected evolution of subacute left thalamic hematoma. 3. Advanced chronic small vessel disease. Intracranial MRA: 1. 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 2. New poor flow related signal beyond the left P2 segment which could be artifactual or interval stenosis. Electronically  Signed   By: Monte Fantasia M.D.   On: 10/04/2017 08:32    Time Spent in minutes  25   Alahni Varone M.D on 10/09/2017 at 1:57 PM  Between 7am to 7pm - Pager - [phone]  After 7pm go to [email or web address] - password [facility]  [facility] -  Office  [phone]

## 2017-10-09 NOTE — Progress Notes (Signed)
Patient meets criteria for inpatient treatment. There are currently no appropriate beds available at [facility] per [facility]. CSW faxed referrals to the following facilities for review.  [address] [facility], [facility], [place], [facility], [place], [facility], [place], [place], [facility], [facility], [place] and [facility].   TTS will continue to seek bed placement.     Herbert Moors, MSW, LCSW-A, LCAS-A 10/09/2017 7:58 AM

## 2017-10-10 DIAGNOSIS — R45851 Suicidal ideations: Secondary | ICD-10-CM

## 2017-10-10 DIAGNOSIS — I63529 Cerebral infarction due to unspecified occlusion or stenosis of unspecified anterior cerebral artery: Secondary | ICD-10-CM | POA: Diagnosis present

## 2017-10-10 DIAGNOSIS — I480 Paroxysmal atrial fibrillation: Secondary | ICD-10-CM

## 2017-10-10 DIAGNOSIS — N179 Acute kidney failure, unspecified: Secondary | ICD-10-CM

## 2017-10-10 LAB — BASIC METABOLIC PANEL
ANION GAP: 7 (ref 5–15)
BUN: 28 mg/dL — ABNORMAL HIGH (ref 6–20)
CALCIUM: 9.2 mg/dL (ref 8.9–10.3)
CO2: 28 mmol/L (ref 22–32)
CREATININE: 1.28 mg/dL — AB (ref 0.61–1.24)
Chloride: 102 mmol/L (ref 101–111)
GFR, EST NON AFRICAN AMERICAN: 52 mL/min — AB (ref >60)
Glucose, Bld: 148 mg/dL — ABNORMAL HIGH (ref 65–99)
Potassium: 4.2 mmol/L (ref 3.5–5.1)
Sodium: 137 mmol/L (ref 135–145)

## 2017-10-10 LAB — GLUCOSE, CAPILLARY
GLUCOSE-CAPILLARY: 143 mg/dL — AB (ref 65–99)
GLUCOSE-CAPILLARY: 77 mg/dL (ref 65–99)
GLUCOSE-CAPILLARY: 67 mg/dL (ref 65–99)

## 2017-10-10 MED ORDER — ASPIRIN 81 MG PO TBEC: 81.0000 mg | DELAYED_RELEASE_TABLET | Freq: Every day | ORAL | 12 refills | Status: AC

## 2017-10-10 MED ORDER — FLUOXETINE HCL 20 MG PO CAPS: 20.0000 mg | ORAL_CAPSULE | Freq: Every day | ORAL | 0 refills | Status: DC

## 2017-10-10 MED ORDER — NICOTINE 14 MG/24HR TD PT24: 14.0000 mg | MEDICATED_PATCH | Freq: Every day | TRANSDERMAL | 0 refills | Status: DC

## 2017-10-10 MED ORDER — APIXABAN 5 MG PO TABS: 5.0000 mg | ORAL_TABLET | Freq: Two times a day (BID) | ORAL | 0 refills | Status: DC

## 2017-10-10 NOTE — Progress Notes (Signed)
Patient son arrive around 4:30, discharge instructions been reviewed with son and patient. Patient Iv removed, tolerated well. Patient's belongings were brought back to patient by security. Patient is eating dinner before leaving.

## 2017-10-10 NOTE — Discharge Summary (Signed)
Physician Discharge Summary  ILAN KAHRS YNW:[id number] DOB: 1941/03/29 DOA: 10/04/2017  PCP: Susy Frizzle, MD  Admit date: 10/04/2017 Discharge date: 10/10/2017  Admitted From: Home Disposition: Home  Recommendations for Outpatient Follow-up:  1. Follow up with PCP in 1-2 weeks 2. Follow-up with neurologist in 4 weeks   Home health: None.  Outpatient PT Equipment/Devices: Rolling walker  Discharge Condition: Fair CODE STATUS: DNR Diet recommendation: Heart Healthy / Carb Modified     Discharge Diagnoses:  Principal Problem:   Acute ischemic cerebrovascular accident (CVA) involving anterior cerebral artery territory [facility])   Active Problems:   Suicidal behavior with attempted self-injury ([place])   Depression   Atrial fibrillation ([place])   Diabetes mellitus type 2, noninsulin dependent ([place])   Hyperlipidemia   Occlusion and stenosis of basilar artery   Thalamic hemorrhage ([facility])   Right hemiparesis ([facility])   Stage 2 chronic kidney disease   TIA involving basilar artery   Palliative care by specialist   Goals of care, counseling/discussion  Brief narrative/HPI Please refer to admission H&P for details, in brief,77 year old male with A. fib, hypertension, BPH, anxiety/depression, tobacco abuse, recent thalamic ICH (6 weeks back) presented to the ED with vertigo, poor balance and left-sided hearing impairment with worsening right-sided weakness.  After his recent left thalamic ICH he had mild residual right upper extremity weakness.  Patient symptoms occurred about 36 hours prior to presentation and due to ongoing symptoms he became frustrated and attempted to kill himself by blowing his head off with a pistol.  Patient son noticed this and intervened and brought patient to the ED.  CT head and MRI brain negative for acute findings but given his clinical presentation neurology recommended this is likely acute infarction of the ipsilateral AICA causing acute unilateral  hearing loss and vertigo.    Hospital course  Principal problem Acute infarction of AICA Symptoms of vertigo associated with unilateral hearing loss likely due to acute infarction of ipsilateral AICA per neurology which usually is not picked up on MRI. Had hemorrhagic ICH 6 weeks.  Neurology recommends full dose aspirin until 5/28 and then transition to 81 mg aspirin and Eliquis 5 mg twice daily.  (He was on this regimen at home prior to admission). Further stroke work-up not needed.  Continue statin. Has residual left-sided hearing loss.  Outpatient PT.  Depression with suicidal ideation. Patient denies suicidal ideation for past 2 days.  Also does not feel depressed today.  Telemetry psych initially recommended inpatient psych admission once medically cleared.  However given no further suicidal ideation and active depression they have cleared him to be discharged home with outpatient follow-up.  Placed on fluoxetine.  Intracranial hemorrhage of the left thalamus on 4/10. Patient was on Xarelto prior to this and was transitioned to apixaban.  Holding apixaban until 5/28 and currently on full dose aspirin.  Paroxysmal A. fib Rate controlled on the monitor.    Now on full dose aspirin until 5/28, thereafter he will be on his home dose of baby aspirin and apixaban.   Essential hypertension Continue amlodipine, HCTZ and lisinopril.  Acute kidney injury Mild, possibly prerenal.  HCTZ and lisinopril were held and can be resumed upon discharge.  Coronary artery disease with history of LAD stenting in 2017 Continue aspirin and statin.  Intolerant to beta-blocker due to bradycardia.  Diabetes mellitus type 2 CBG stable.  Not on medication.  A1c of 6.4.  Family Communication  : None at bedside  Disposition Plan  home  Consults  : Neurology, palliative care, TTS  Procedures  : CT head, MRI brain     Discharge Instructions   Allergies as of 10/10/2017   No  Known Allergies     Medication List    STOP taking these medications   PARoxetine 20 MG tablet Commonly known as:  PAXIL     TAKE these medications   amLODipine 10 MG tablet Commonly known as:  NORVASC Take 1 tablet (10 mg total) by mouth daily.   apixaban 5 MG Tabs tablet Commonly known as:  ELIQUIS Take 1 tablet (5 mg total) by mouth 2 (two) times daily. Start taking on:  10/12/2017 What changed:  These instructions start on 10/12/2017. If you are unsure what to do until then, ask your doctor or other care provider.   aspirin 81 MG EC tablet Take 1 tablet (81 mg total) by mouth daily. Take 4 tablets daily for next 2 days then 1 tablet daily Start taking on:  10/12/2017 What changed:    additional instructions  These instructions start on 10/12/2017. If you are unsure what to do until then, ask your doctor or other care provider.   finasteride 5 MG tablet Commonly known as:  PROSCAR TAKE 1 BY MOUTH DAILY   FLUoxetine 20 MG capsule Commonly known as:  PROZAC Take 1 capsule (20 mg total) by mouth daily. Start taking on:  10/11/2017   hydrochlorothiazide 12.5 MG capsule Commonly known as:  MICROZIDE Take 1 capsule (12.5 mg total) by mouth daily.   lisinopril 20 MG tablet Commonly known as:  PRINIVIL,ZESTRIL Take 1 tablet (20 mg total) by mouth daily.   nicotine 14 mg/24hr patch Commonly known as:  NICODERM CQ - dosed in mg/24 hours Place 1 patch (14 mg total) onto the skin daily. Start taking on:  10/11/2017   pantoprazole 40 MG tablet Commonly known as:  PROTONIX Take 1 tablet (40 mg total) by mouth daily.   rosuvastatin 20 MG tablet Commonly known as:  CRESTOR Take 1 tablet (20 mg total) by mouth daily.      Follow-up Information    Susy Frizzle, MD. Schedule an appointment as soon as possible for a visit in 1 week(s).   Specialty:  Family Medicine Contact information: [address] [place] [address] [phone]        [name] Odor, MD. Schedule an appointment as soon as possible for a visit in 4 week(s).   Specialty:  Neurology Contact information: [address] [place] [address] [phone]          No Known Allergies   Procedures/Studies: Ct Head Wo Contrast  Result Date: 10/04/2017 CLINICAL DATA:  Initial evaluation for persistent vertigo, recent stroke. EXAM: CT HEAD WITHOUT CONTRAST TECHNIQUE: Contiguous axial images were obtained from the base of the skull through the vertex without intravenous contrast. COMPARISON:  Prior CT from 09/10/2017. FINDINGS: Brain: Continued normal expected interval evolution of previously seen left thalamic hematoma, now largely chronic in appearance. Atrophy with chronic small vessel ischemic change, similar to previous. Remote left cerebellar infarct with dystrophic calcification. Remote right basal ganglia lacunar infarct noted as well. No acute large vessel territory infarct. No acute intracranial hemorrhage. No mass lesion, midline shift or mass effect. No hydrocephalus. No extra-axial fluid collection. Vascular: Intracranial atherosclerotic change. No hyperdense vessel. Skull: Scalp soft tissues and calvarium demonstrate no acute abnormality. Sinuses/Orbits: Globes and orbital soft tissues demonstrate no acute finding. Scattered mucosal thickening throughout the ethmoidal air  cells. Paranasal sinuses are otherwise largely clear. No mastoid effusion. Other: None. IMPRESSION: 1. No acute intracranial abnormality. 2. Normal interval evolution of recently identified left thalamic hematoma, now largely chronic in appearance. 3. Atrophy with chronic small vessel ischemic disease with additional scatter remote infarcts as above. Electronically Signed   By: Jeannine Boga M.D.   On: 10/04/2017 06:59   Ct Head Wo Contrast  Result Date: 09/10/2017 CLINICAL DATA:  Intracranial hemorrhage follow-up EXAM: CT HEAD WITHOUT CONTRAST TECHNIQUE: Contiguous axial images were  obtained from the base of the skull through the vertex without intravenous contrast. COMPARISON:  08/27/2017 FINDINGS: Brain: Decreasing density of the left thalamic hematoma. High-density only encompasses a 9 mm area today. There is associated swelling, which is expected, with mild encroachment on the third ventricle. No evidence of acute or interval hemorrhage. No hydrocephalus or collection. Advanced chronic small vessel ischemic type change in the cerebral white matter. 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 Vascular: Atherosclerotic calcification. Skull: No acute or aggressive finding Sinuses/Orbits: Patchy mucosal thickening in the paranasal sinuses. Bilateral cataract resection. IMPRESSION: 1. Expected evolution of subacute left thalamic hematoma. No new abnormality. 2. Advanced chronic small vessel ischemia. Electronically Signed   By: Monte Fantasia M.D.   On: 09/10/2017 16:41   Mr Jodene Nam Head Wo Contrast  Result Date: 10/04/2017 CLINICAL DATA:  Vertigo, persistent, central EXAM: MRI HEAD WITHOUT CONTRAST MRA HEAD WITHOUT CONTRAST TECHNIQUE: Multiplanar, multiecho pulse sequences of the brain and surrounding structures were obtained without intravenous contrast. Angiographic images of the head were obtained using MRA technique without contrast. COMPARISON:  08/25/2017 FINDINGS: MRI HEAD FINDINGS Brain: Expected evolution of subacute hematoma in the left thalamus. No mass effect seen today. There is extensive chronic small vessel ischemic gliosis in the cerebral white matter. Remote small vessel infarct in the left cerebellum in bilateral basal ganglia. Generalized atrophy. No acute infarct, hemorrhage, hydrocephalus, or masslike finding. Vascular: Arterial findings below. Normal dural venous sinus flow voids. Skull and upper cervical spine: Negative for marrow lesion Sinuses/Orbits: Bilateral cataract resection. MRA HEAD FINDINGS Chronic tandem severe stenosis/occlusion of the basilar with  large posterior communicating arteries which are patent. There is poor flow seen beyond the left P2 segment, which could be related to direction of flow or new stenosis. No associated occipital infarct. There is poor signal in the left anterior temporal branch which is likely due to direction of flow, also seen previously. Mild to moderate narrowing of the right MCA. Mild narrowing of the left A1 segment. IMPRESSION: Brain MRI: 1. No acute finding. 2. Expected evolution of subacute left thalamic hematoma. 3. Advanced chronic small vessel disease. Intracranial MRA: 1. 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 2. New poor flow related signal beyond the left P2 segment which could be artifactual or interval stenosis. Electronically Signed   By: Monte Fantasia M.D.   On: 10/04/2017 08:32   Mr Brain Wo Contrast  Result Date: 10/04/2017 CLINICAL DATA:  Vertigo, persistent, central EXAM: MRI HEAD WITHOUT CONTRAST MRA HEAD WITHOUT CONTRAST TECHNIQUE: Multiplanar, multiecho pulse sequences of the brain and surrounding structures were obtained without intravenous contrast. Angiographic images of the head were obtained using MRA technique without contrast. COMPARISON:  08/25/2017 FINDINGS: MRI HEAD FINDINGS Brain: Expected evolution of subacute hematoma in the left thalamus. No mass effect seen today. There is extensive chronic small vessel ischemic gliosis in the cerebral white matter. Remote small vessel infarct in the left cerebellum in bilateral basal  ganglia. Generalized atrophy. No acute infarct, hemorrhage, hydrocephalus, or masslike finding. Vascular: Arterial findings below. Normal dural venous sinus flow voids. Skull and upper cervical spine: Negative for marrow lesion Sinuses/Orbits: Bilateral cataract resection. 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 There is poor flow  seen beyond the left P2 segment, which could be related to direction of flow or new stenosis. No associated occipital infarct. There is poor signal in the left anterior temporal branch which is likely due to direction of flow, also seen previously. Mild to moderate narrowing of the right MCA. Mild narrowing of the left A1 segment. IMPRESSION: Brain MRI: 1. No acute finding. 2. Expected evolution of subacute left thalamic hematoma. 3. Advanced chronic small vessel disease. Intracranial MRA: 1. 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 2. New poor flow related signal beyond the left P2 segment which could be artifactual or interval stenosis. Electronically Signed   By: Monte Fantasia M.D.   On: 10/04/2017 08:32      Subjective: No overnight events.  Denies feeling depressed or having suicidal thoughts.  Discharge Exam: Vitals:   10/09/17 2120 10/10/17 0710  BP: (!) 95/44 (!) 143/79  Pulse: 100 63  Resp: 16 18  Temp: 98.4 F (36.9 C) 97.7 F (36.5 C)  SpO2: 98% 100%   Vitals:   10/09/17 0537 10/09/17 1452 10/09/17 2120 10/10/17 0710  BP: 120/77 (!) 97/56 (!) 95/44 (!) 143/79  Pulse: 65 (!) 39 100 63  Resp: 16 18 16 18   Temp: 98.3 F (36.8 C) 98.7 F (37.1 C) 98.4 F (36.9 C) 97.7 F (36.5 C)  TempSrc: Oral  Oral Oral  SpO2: 97% 98% 98% 100%  Weight:      Height:        General: Elderly male not in distress HEENT: Moist mucosa, supple neck Chest: Clear bilaterally CVS: S1 and S2 regular, no murmurs GI: Soft, nondistended, nontender, bowel sounds present Musculoskeletal: Warm, no edema CNS: Alert and oriented, complains of left-sided hearing loss, 4+/5 power in right upper extremity   The results of significant diagnostics from this hospitalization (including imaging, microbiology, ancillary and laboratory) are listed below for reference.     Microbiology: No results found for this or any previous visit (from the past 240  hour(s)).   Labs: BNP (last 3 results) No results for input(s): BNP in the last 8760 hours. Basic Metabolic Panel: Recent Labs  Lab 10/04/17 2155 10/05/17 0430 10/07/17 0547 10/08/17 0853 10/09/17 0639 10/10/17 0859  NA  --  138 140 137 139 137  K  --  3.5 3.6 3.8 3.8 4.2  CL  --  103 102 100* 103 102  CO2  --  27 31 29 28 28   GLUCOSE  --  97 117* 129* 111* 148*  BUN  --  25* 27* 27* 30* 28*  CREATININE  --  1.16 1.43* 1.23 1.33* 1.28*  CALCIUM  --  9.0 9.1 9.2 8.9 9.2  MG 1.9  --  2.1  --   --   --   PHOS 3.2  --   --   --   --   --    Liver Function Tests: Recent Labs  Lab 10/04/17 0547  AST 16  ALT 17  ALKPHOS 63  BILITOT 0.8  PROT 7.1  ALBUMIN 3.9   No results for input(s): LIPASE, AMYLASE in the last 168 hours. No results for  input(s): AMMONIA in the last 168 hours. CBC: Recent Labs  Lab 10/04/17 0547 10/05/17 0430  WBC 17.9* 8.2  NEUTROABS 15.8  --   HGB 15.2 14.0  HCT 46.7 43.7  MCV 91.9 92.0  PLT 192 185   Cardiac Enzymes: Recent Labs  Lab 10/04/17 0547 10/04/17 0904  TROPONINI 0.12* 0.18*   BNP: Invalid input(s): POCBNP CBG: Recent Labs  Lab 10/09/17 1058 10/09/17 1604 10/09/17 2118 10/10/17 0713 10/10/17 1146  GLUCAP 106* 125* 103* 143* 77   D-Dimer No results for input(s): DDIMER in the last 72 hours. Hgb A1c No results for input(s): HGBA1C in the last 72 hours. Lipid Profile No results for input(s): CHOL, HDL, LDLCALC, TRIG, CHOLHDL, LDLDIRECT in the last 72 hours. Thyroid function studies No results for input(s): TSH, T4TOTAL, T3FREE, THYROIDAB in the last 72 hours.  Invalid input(s): FREET3 Anemia work up No results for input(s): VITAMINB12, FOLATE, FERRITIN, TIBC, IRON, RETICCTPCT in the last 72 hours. Urinalysis    Component Value Date/Time   COLORURINE YELLOW 10/04/2017 0510   APPEARANCEUR CLEAR 10/04/2017 0510   LABSPEC 1.020 10/04/2017 0510   PHURINE 6.0 10/04/2017 0510   GLUCOSEU NEGATIVE 10/04/2017 0510    HGBUR NEGATIVE 10/04/2017 0510   HGBUR negative 08/22/2007 0822   BILIRUBINUR NEGATIVE 10/04/2017 0510   KETONESUR 20 (A) 10/04/2017 0510   PROTEINUR 30 (A) 10/04/2017 0510   UROBILINOGEN 0.2 08/22/2007 0822   NITRITE NEGATIVE 10/04/2017 0510   LEUKOCYTESUR NEGATIVE 10/04/2017 0510   Sepsis Labs Invalid input(s): PROCALCITONIN,  WBC,  LACTICIDVEN Microbiology No results found for this or any previous visit (from the past 240 hour(s)).   Time coordinating discharge: 35 minutes  SIGNED:   Louellen Molder, MD  [facility] 10/10/2017, 12:28 PM Pager   If 7PM-7AM, please contact night-coverage [email or web address] Password [email or web address]

## 2017-10-10 NOTE — Discharge Instructions (Signed)
Hospital Discharge After a Stroke  °Being discharged from the hospital after a stroke can feel overwhelming. Many things may be different, and it is normal to feel scared or anxious. Some stroke survivors may be able to return to their homes, and others may need more specialized care on a temporary or permanent basis. °Your stroke care team will work with you to develop a discharge plan that is best for you. Ask questions if you do not understand something. Invite a friend or family member to participate in discharge planning. Understanding and following your discharge plan can help to prevent another stroke or other problems. °Understanding your medicines °After a stroke, your health care provider may prescribe one or more types of medicine. It is important to take medicines exactly as told by your health care provider. Serious harm, such as another stroke, can happen if you are unable to take your medicine exactly as prescribed. Make sure you understand: °· What medicine to take. °· Why you are taking the medicine. °· How and when to take it. °· If it can be taken with your other medicines and herbal supplements. °· Possible side effects. °· When to call your health care provider if you have any side effects. °· How you will get and pay for your medicines. Medical assistance programs may be able to help you pay for prescription medicines if you cannot afford them. ° °If you are taking an anticoagulant, be sure to take it exactly as told by your health care provider. This type of medicine can increase the risk of bleeding because it works to prevent blood from clotting. You may need to take certain precautions to prevent bleeding. You should contact your health care provider if you have: °· Bleeding or bruising. °· A fall or other injury to your head. °· Blood in your urine or stool (feces). ° °Planning for home safety °Take steps to prevent falls, such as installing grab bars or using a shower chair. Ask a friend  or family member to get needed things in place before you go home if possible. A therapist can come to your home to make recommendations for safety equipment. Ask your health care provider if you would benefit from this service or from home care. °Getting needed equipment °Ask your health care provider for a list of any medical equipment and supplies you will need at home. These may include items such as: °· Walkers. °· Canes. °· Wheelchairs. °· Hand-strengthening devices. °· Special eating utensils. ° °Medical equipment can be rented or purchased, depending on your insurance coverage. Check with your insurance company about what is covered. °Keeping follow-up visits °After a stroke, you will need to follow up regularly with a health care provider. You may also need rehabilitation, which can include physical therapy, occupational therapy, or speech-language therapy. °Keeping these appointments is very important to your recovery after a stroke. Be sure to bring your medicine list and discharge papers with you to your appointments. If you need help to keep track of your schedule, use a calendar or appointment reminder. °Preventing another stroke °Having a stroke puts you at risk for another stroke in the future. Ask your health care provider what actions you can take to lower the risk. These may include: °· Increasing how much you exercise. °· Making a healthy eating plan. °· Quitting smoking. °· Managing other health conditions, such as high blood pressure, high cholesterol, or diabetes. °· Limiting alcohol use. ° °Knowing the warning signs of a stroke °  Make sure you understand the signs of a stroke. Before you leave the hospital, you will receive information outlining the stroke warning signs. Share these with your friends and family members. °"BE FAST" is an easy way to remember the main warning signs of a stroke: °· B - Balance. Signs are dizziness, sudden trouble walking, or loss of balance. °· E - Eyes. Signs  are trouble seeing or a sudden change in vision. °· F - Face. Signs are sudden weakness or numbness of the face, or the face or eyelid drooping on one side. °· A - Arms. Signs are weakness or numbness in an arm. This happens suddenly and usually on one side of the body. °· S - Speech. Signs are sudden trouble speaking, slurred speech, or trouble understanding what people say. °· T - Time. Time to call emergency services. Write down what time symptoms started. ° °Other signs of stroke may include: °· A sudden, severe headache with no known cause. °· Nausea or vomiting. °· Seizure. ° °These symptoms may represent a serious problem that is an emergency. Do not wait to see if the symptoms will go away. Get medical help right away. Call your local emergency services (911 in the [place] Do not drive yourself to the hospital. °Make note of the time that you had your first symptoms. Your emergency responders or emergency room staff will need to know this information. °Summary °· Being discharged from the hospital after a stroke can feel overwhelming. It is normal to feel scared or anxious. °· Make sure you take medicines exactly as told by your health care provider. °· Know the warning signs of a stroke, and get help right way if you have any of these symptoms. "BE FAST" is an easy way to remember the main warning signs of a stroke. °This information is not intended to replace advice given to you by your health care provider. Make sure you discuss any questions you have with your health care provider. °Document Released: 08/07/2016 Document Revised: 08/07/2016 Document Reviewed: 08/07/2016 °Elsevier Interactive Patient Education © 2018 [employer] ° °

## 2017-10-10 NOTE — Progress Notes (Signed)
Patient has been discharged, will wait until his son arrives to do discharge teaching and go over medications. Will wait till then to take out IV as well. Telemetry as been discontinued.

## 2017-10-10 NOTE — Progress Notes (Signed)
Physical Therapy Treatment Patient Details Name: Eugene [name] MRN: [id number] DOB: April 22, 1941 Today's Date: 10/10/2017    History of Present Illness Eugene [name] is a 77 y/o male with PMH significant for A. Fib, HTN, HLD, BPH, pre-diabetes, anxiety/depression, tobacco abuse and recent left thalamic ICH; who presented to ED with complaints of poor balance, vertigo, left side hearing impairment and worsening right side weakness. Patient had mild residual right upper extremity weakness after left thalamic ICH; but was pretty functional and living alone. Patient symptoms presented approx 36 hours ago and failed to improve; patient felt frustrated with the situation and attempted to kill himself by blowing his head off with his pistol. Patient's son noticed that his father was done and making reference as the end was close; he removed patient's ammunition and truncated patient's plans (as he had the chance to pulled the trigger). Son then took the gun away and took patient to ED for further evaluation and treatment.    PT Comments    This session patient was found awake and alert in bed. Patient performed bed mobility independently. Patient performed sit to stand without rolling walker. Patient required minimal guarding to ambulate 350 feet with rolling walker. Therapist provided verbal cues to stay close to the walker and to stand as if balancing books on head. Patient demonstrated improved safety following cues. Patient returned to bed with sitter in room. Patient would continue to benefit from skilled physical therapy in order to continue progressing toward goals and safety with ambulation, balance, and overall functional mobility.     Follow Up Recommendations  Outpatient PT;Supervision for mobility/OOB     Equipment Recommendations  Rolling walker with 5" wheels    Recommendations for Other Services       Precautions / Restrictions Precautions Precautions:  Fall Restrictions Weight Bearing Restrictions: No    Mobility  Bed Mobility Overal bed mobility: Independent Bed Mobility: Supine to Sit     Supine to sit: Independent        Transfers Overall transfer level: Modified independent Equipment used: Rolling walker (2 wheeled) Transfers: Sit to/from Stand Sit to Stand: Supervision         General transfer comment: Patient used rolling walker for stand to sit transfer, but not for sit to stand transfer.   Ambulation/Gait Ambulation/Gait assistance: Min guard(Verbal cues to stay with the rolling walker, cues for safety) Ambulation Distance (Feet): 350 Feet Assistive device: Rolling walker (2 wheeled) Gait Pattern/deviations: Decreased step length - right;Decreased step length - left;Decreased stride length;Drifts right/left Gait velocity: Below age-related norm   General Gait Details: improved balance ambulating wiht RW, minimal unsteadiness.   Stairs             Wheelchair Mobility    Modified Rankin (Stroke Patients Only)       Balance Overall balance assessment: Independent Sitting-balance support: Feet supported;No upper extremity supported Sitting balance-Leahy Scale: Good     Standing balance support: During functional activity;No upper extremity supported;Bilateral upper extremity supported Standing balance-Leahy Scale: Fair Standing balance comment: fair without AD, fair/good with RW                            Cognition Arousal/Alertness: Awake/alert Behavior During Therapy: WFL for tasks assessed/performed Overall Cognitive Status: Within Functional Limits for tasks assessed  Exercises      General Comments        Pertinent Vitals/Pain Pain Assessment: No/denies pain    Home Living                      Prior Function            PT Goals (current goals can now be found in the care plan section) Progress  towards PT goals: Progressing toward goals    Frequency    7X/week      PT Plan Current plan remains appropriate    Co-evaluation              AM-PAC PT "6 Clicks" Daily Activity  Outcome Measure  Difficulty turning over in bed (including adjusting bedclothes, sheets and blankets)?: None Difficulty moving from lying on back to sitting on the side of the bed? : None Difficulty sitting down on and standing up from a chair with arms (e.g., wheelchair, bedside commode, etc,.)?: None Help needed moving to and from a bed to chair (including a wheelchair)?: A Little Help needed walking in hospital room?: A Little Help needed climbing 3-5 steps with a railing? : A Little 6 Click Score: 21    End of Session Equipment Utilized During Treatment: Gait belt Activity Tolerance: Patient tolerated treatment well Patient left: with call bell/phone within reach;with nursing/sitter in room;in bed(Sitter in room. Patient sitting at EOB) Nurse Communication: Mobility status PT Visit Diagnosis: Unsteadiness on feet (R26.81);Other abnormalities of gait and mobility (R26.89);Muscle weakness (generalized) (M62.81)     Time: 2409-7353 PT Time Calculation (min) (ACUTE ONLY): 15 min  Charges:  $Gait Training: 8-22 mins                    G Codes:      Clarene Critchley PT, DPT 11:27 AM, 10/10/17 [phone]

## 2017-10-10 NOTE — Progress Notes (Signed)
Patient is seen by me via tele-psych and I have consulted with Dr. Mallie Darting.  Patient denies SI/HI/AVH and contracts for safety.  Safety plan has been established and his son will be staying with him after discharge from the hospital.  Son was contacted by CSW and his plan was confirmed.  Patient is cleared by psychiatry and does not need psychiatric inpatient treatment.

## 2017-10-10 NOTE — B[facility] Counselor (Signed)
Re-assessment:   Patient re-assessed this day. Patient denies suicidal ideations expressing it's a new day. Patient report his son and sister are supportive. Patient denies homicidal ideations also denies auditory / visual hallucinations.    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 Patient denies SI since coming into the hospital after losing his hearing. Patient report he lives alone however he gave verbal permission to call son for collateral information. Patient denies homicidal ideations, denies auditory / visual hallucinations.  Collateral - Eugene [name] [phone])   Safety Plan  TTS writer spoke with patient's son concerning a plan of action upon discharge. Son expressed he would be able to stay with his father upon discharge providing 24-hour care. The son asks that his father be discharged into his custody.

## 2017-10-10 NOTE — [facility] Counselor (Signed)
TTS Probation officer communicated with patient's Amy, RN, to notify her of patient disposition.

## 2017-10-12 ENCOUNTER — Telehealth: Payer: Self-pay | Admitting: Family Medicine

## 2017-10-12 ENCOUNTER — Other Ambulatory Visit: Payer: Self-pay | Admitting: Physical Medicine and Rehabilitation

## 2017-10-12 NOTE — Telephone Encounter (Signed)
Pt's son aware and will discuss at ov on 10/15/17

## 2017-10-12 NOTE — Telephone Encounter (Signed)
We stopped enablex due to anticholinergic effects at last ov.  Do they want to resume that now?  I would want to see him first as he was just admitted again with cva symptoms and suicide attempt in case the hospital changed other meds.

## 2017-10-12 NOTE — Telephone Encounter (Signed)
Pt's son called to get a refill on his OAB medication but it looks like you dc'd this at his lov?!? Should he be taking and medication for OAB? (didn't know if you gave samples of something different)

## 2017-10-15 ENCOUNTER — Other Ambulatory Visit: Payer: Self-pay | Admitting: Family Medicine

## 2017-10-15 ENCOUNTER — Ambulatory Visit: Payer: Medicare Other | Admitting: Family Medicine

## 2017-10-15 ENCOUNTER — Encounter: Payer: Self-pay | Admitting: Family Medicine

## 2017-10-15 VITALS — BP 126/80 | HR 78 | Temp 98.2°F | Resp 16 | Ht 72.0 in | Wt 186.0 lb

## 2017-10-15 DIAGNOSIS — Z09 Encounter for follow-up examination after completed treatment for conditions other than malignant neoplasm: Secondary | ICD-10-CM | POA: Diagnosis not present

## 2017-10-15 DIAGNOSIS — Z8673 Personal history of transient ischemic attack (TIA), and cerebral infarction without residual deficits: Secondary | ICD-10-CM | POA: Diagnosis not present

## 2017-10-15 DIAGNOSIS — T1491XA Suicide attempt, initial encounter: Secondary | ICD-10-CM | POA: Diagnosis not present

## 2017-10-15 DIAGNOSIS — R45851 Suicidal ideations: Secondary | ICD-10-CM

## 2017-10-15 MED ORDER — ARIPIPRAZOLE 5 MG PO TABS: 5.0000 mg | ORAL_TABLET | Freq: Every day | ORAL | 5 refills | Status: DC

## 2017-10-15 NOTE — Progress Notes (Signed)
Subjective:    Patient ID: Eugene [name], male    DOB: 09-Jul-1940, 77 y.o.   MRN: [id number]  HPI  Unfortunately, patient was recently admitted to the hospital with symptoms of a right-sided stroke.  CT scan revealed an intracranial hemorrhage in the left thalamic tract.  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 d/t HTN in setting of Xarelto and plavix use Active Problems:   Hypertension   CAD (coronary artery disease)   Atrial fibrillation (HCC)   NSTEMI (non-ST elevated myocardial infarction) ([place])   Chronic anticoagulation   Dysmetria   Benign essential HTN   Hyperlipidemia   OSA (obstructive sleep apnea)   Diabetes mellitus type 2 in nonobese ([place])   Occlusion and stenosis of basilar artery   Hypertensive emergency   Sundowning   Hx of completed stroke      HISTORY OF PRESENT ILLNESS Eugene L Stricklandis an 77 y.o.malewith history of hypertension, hyperlipidemiaandatrial fibrillation forwhich he is on Xarelto. At baseline thepatient is quite independent, drivesa car, was able to take care of all of his activities of daily living. However, per family he may not have been keeping up with his medications in terms of exact doses and schedules, with the patient confirming this. Modified Rankin:Rankin Score=0. Hewent to sleep last night around 10 PM (LKW 08/24/2017 at 2000) and woke around 930 this a.m., notingthat he had decreased sensation along hisright side along with significant dysarthria. For that reason [facility]was [facility], where aCT of hisheadrevealeda left thalamic bleed. Blood pressure was 157/98. Patient is not a good historian and it is unclear what his blood pressures usually are. CT shows a L thalamic ICH. Intracerebral  Hemorrhage (ICH) Score = 0. He was transferred to [facility] for further evaluation and treatment.    HOSPITAL COURSE Mr.Eugene L Stricklandis a 77 y.o.malewith history of afib on Xarelto, CAD/MI on plavix, HLD, HTN, OSA admitted for right sided numbness and slurry speech.   ICH: left thalamic small ICH likely due to HTN in the setting of Xarelto and plavix use s/p reversal with Kcentra  Resultant partial expressive aphasia, right pronator drift  CT head left thalamic ICH with old left cerebellar infarct with calcification  MRILeft thalamic ICH   MRASevere BA stenosis vs. Occlusion with distal constitution  CTA head and neck - stable hematoma, BA occlusion with distal recon, b/l VA stenosis L>R.   Repeat CT hematoma stable  2D EchoEF 55-60%  LDL101  HgbA1c6.4  clopidogrel 75 mg daily and Xarelto (rivaroxaban) dailyprior to admission, now on No antithromboticdue to [place]. Add ASA 81mg  7 days post ICH as neuro stable (09/01/17), and repeat CT in 2-3 weeks, if ICH resolved, add eliquis to ASA 81mg .   Patient counseled to be compliant withhisantithrombotic medications  Ongoing aggressive stroke risk factor management  Therapy recommendations: CIR  Disposition: CIRplanned, however, family decided on SNF when copays discussed and desire for longer rehab time, then they changed their mind back to CIR. Will d/c to CIR today.  Wheezing with SOB, resolved  Mild wheezing and SOB  CXR no pulmonary edema  EKG afib, no ST-T changes  duoneb PRN  Lasix 40mg  once  Leukocytosis - WBC 7.4->10.3->11.7->8.9  Afib on Xarelto  Tele showed persistent AFib  Rate controlled, no RVR  Hold off AC for now.   Will repeat CT in 2-3 weeks, if ICH resolves, start eliquis  CAD/MI  On plavix PTA  Hold off for now due to [place]  ASA 81mg  7 days post ICH if neuro stable (09/01/17)  BA occlusion  Confirmed on CTA and MRA  With distal constitution from  collaterals  Consistent with chronic occlusion  Start ASA on 09/01/17 if neuro stable  BP goal 120-140, avoid low BP  Hypertensive Emergency  BP as high as 171/118  Treated with Cleviprex in hospital  On home norvasc, lisinopril and metoprolol, resumed in hopsital  BP goal < 140 in hospital   Long term BP goal 120-140. Avoid hypotension due to BA occlusion, avoid hypertension due to anticoagulation use.  Need BP check closely at home  Hyperlipidemia  Home meds: crestor 20   LDL 101, goal < 70  Now on crestor 20  Continue statin at discharge  Diabetes type II  On metformin at home  HGBA1c 6.4  SSI in hosp  Glucoses stable, WNL not on metformin here  Resume metformin per rehab  Sundowning  May have underlying cognitive impairment  on Seroquel 25 nightly  Improved since on seroquel  Other Stroke Risk Factors  Hx stroke/TIA- imaging showed old left cerebellar infarct with calcification  Obstructive sleep apnea    DISCHARGE PLAN  Disposition:  Inpatient rehab for ongoing PT, OT and ST  Repeat CT head in 2-3 weeks, if hemorrhage resolved, add Eliquis to aspirin 81 for secondary stroke prevention  Ongoing risk factor control by Primary Care Physician at time of discharge  Follow-up Susy Frizzle, MD in 2 weeks.  Follow-up in [facility] in 4 weeks, office to schedule an appointment   09/20/17 A head CT was performed on April 26 which revealed the expected evolution of his left thalamic intracranial hemorrhage.  At that time neurology cleared the patient to resume Eliquis in addition to his aspirin.  He was discharged from inpatient rehab on April 27.  He is here today for follow-up.  Prior to today, I have not seen the patient since 2017.  At that time he had suffered a non-ST elevation myocardial infarction.  This occurred while the patient was taking aspirin in addition to Pradaxa.  At that time  cardiology recommended to continue the patient on dual antiplatelet therapy in addition to his Pradaxa for 1 month then discontinue his aspirin with the plan of continuing his Plavix and Pradaxa indefinitely.  Ultimately that is how the patient was started on Plavix in addition to antithrombotic therapy although prior to his hospitalization he had been switched to Xarelto.  Patient is here today with his son who he is living with.  He is interested in resuming driving.  I walked with the patient around our clinic today.  He is dragging his right foot slightly.  He also demonstrates some right-sided neglect as he walks.  Several times he almost tripped over boxes or other items throughout the clinic near his right leg.  He also seems to have slowed reaction time.  Given these findings, I have recommended against resuming driving at the present time.  Otherwise he is doing very well.  He is not been taking Seroquel which they were giving to him in the hospital for sundowning.  His son states that he does not feel that his father needs that now that he is back at home.  He is much less confused.  He  is not demonstrating any evidence of delirium.  Furthermore he has not been taking the Enablex.  I explained to the patient that this is an anticholinergic medication.  This increases his risk for delirium and confusion as it does cross the blood-brain barrier potentially.  He states without the medication, he experiences frequency about every 3-4 hours at night.  Patient states that he can live with that at the present time.  Unfortunately he is smoking.  He has no desire to quit at the present time.  His blood pressure today is extremely low for this patient given the complexity of his medical problems.  He denies any dizziness or lightheadedness or syncope.  He is off his metformin which he was taking prior to his hospital admission.  His hemoglobin A1c on admission was 6.4 indicating excellent control.  However he is  no longer on any hypoglycemic agent.  He is also now taking Prozac 20 mg a day for depression.  However neither the son nor the father state that they feel that he needs this medication.  He is not certain why he is taking it.  This is something that was started during the hospitalization.  At that time, my plan was: I have recommended avoiding Enablex due to polypharmacy and its anticholinergic effects.  Patient will discontinue Seroquel as he has not been taking it for more than a week and does not seem to use the medication or require it.  I have recommended decreasing Prozac to 10 mg a day and reassessing in 2 to 3 weeks.  If no significant changes seen, I would discontinue the medicine altogether.  His blood pressure today is low.  Ideally I like to keep the patient between 062 and 376 systolic over 28-31 diastolic.  Patient will buy a blood pressure monitor and check his blood pressure at home.  If consistently low, I will decrease his antihypertensive medication and then recheck his blood pressure in 2 to 3 weeks.  I explained to the patient why he needs to stay on aspirin due to his history of stroke as well as his history of non-ST elevation myocardial infarction in addition to his antithrombotic therapy and the version of Eliquis given his atrial fibrillation.  However I also explained that we need to maintain strict control of his blood pressure to avoid hypertensive urgency.  Also explained the need we need to avoid hypotension given his basilar artery stenosis.  Reassess the patient in 3 weeks  10/15/17 Unfortunately the patient was recently admitted to the hospital again.  I have copied relevant portions of his discharge summary and included them below for my reference: Admit date: 10/04/2017 Discharge date: 10/10/2017  Admitted From: Home Disposition: Home  Discharge Diagnoses:  Principal Problem:   Acute ischemic cerebrovascular accident (CVA) involving anterior cerebral artery territory  [facility])   Active Problems:   Suicidal behavior with attempted self-injury ([place])   Depression   Atrial fibrillation ([place])   Diabetes mellitus type 2, noninsulin dependent ([place])   Hyperlipidemia   Occlusion and stenosis of basilar artery   Thalamic hemorrhage (HCC)   Right hemiparesis (HCC)   Stage 2 chronic kidney disease   TIA involving basilar artery   Palliative care by specialist   Goals of care, counseling/discussion  Brief narrative/HPI Please refer to admission H&P for details, in brief,77 year old male with A. fib, hypertension, BPH, anxiety/depression, tobacco abuse, recent thalamic ICH(6 weeks back) presented to the ED with vertigo, poor balance and left-sided hearing impairment with  worsening right-sided weakness. After his recent left thalamic ICH he had mild residual right upper extremity weakness. Patient symptoms occurred about 36 hours prior to presentation and due to ongoing symptoms he became frustrated and attempted to kill himself by blowing his head off with a pistol. Patient son noticed this and intervened and brought patient to the ED. CT head and MRI brain negative for acute findings but given his clinical presentation neurology recommended this is likely acute infarction of the ipsilateral AICA causing acute unilateral hearing loss and vertigo.    Hospital course  Principal problem Acute infarction of AICA Symptoms of vertigo associated with unilateral hearing loss likely due to acute infarction of ipsilateral AICA per neurology which usually is not picked up on MRI. Had hemorrhagic ICH 6 weeks.Neurology recommends full dose aspirin until 5/28 and then transition to 81 mg aspirin and Eliquis 5 mg twice daily.  (He was on this regimen at home prior to admission).Further stroke work-up not needed. Continue statin. Has residual left-sided hearing loss.  Outpatient PT.  Depression with suicidal ideation. Patient denies suicidal ideation for past 2  days.  Also does not feel depressed today.  Telemetry psych initially recommended inpatient psych admission once medically cleared.  However given no further suicidal ideation and active depression they have cleared him to be discharged home with outpatient follow-up.  Placed on fluoxetine.  Intracranial hemorrhage of the left thalamus on 4/10. Patient was on Xarelto prior to this and was transitioned to apixaban. Holding apixaban until 5/28 and currently on full dose aspirin.  Paroxysmal A. fib Rate controlled on the monitor.   Now on full dose aspirin until 5/28, thereafter he will be on his home dose of baby aspirin and apixaban.   Essential hypertension Continue amlodipine, HCTZ and lisinopril.  Acute kidney injury Mild, possibly prerenal.  HCTZ and lisinopril were held and can be resumed upon discharge.  Coronary artery disease with history of LAD stenting in 2017 Continue aspirin and statin. Intolerant to beta-blocker due to bradycardia.  Diabetes mellitus type 2 CBG stable.  Not on medication.  A1c of 6.4.     10/15/17 Patient is here today with his son.  I directly questioned the patient about his suicide attempt.  Patient is calm and very matter-of-fact.  He states that he is lived a good life.  He does not want to be a burden to his family specifically his son.  He states that when he had this most recent stroke, he became extremely afraid that he would become a vegetable and therefore burden his family.  He was also afraid that he would then not be able to do anything to help with the situation.  Therefore, the patient decided to commit suicide.  He called the sheriff's department so that they would come and find the body, he put a gun to his head and pulled the trigger.  However his son had removed the ammunition and therefore the patient was unsuccessful.  This prompted his son to take him directly to the hospital where he was admitted.  Patient continues to  endorse the same sentiment.  He states that he is not "crazy".  However his primary concern is that his situation is only going to worsen and that he will only be a burden to his son and therefore he wants to die with dignity.  However he states that he has no plan to commit suicide because all available options have been removed from him.  His son is  controlling all of his medication, the weapons have been removed from the home, and the patient has no other plan of suicide.  He is currently on Prozac.  Past Medical History:  Diagnosis Date  . Anxiety   . Atrial fibrillation ([place])   . BPH (benign prostatic hypertrophy)   . Cataracts, both eyes   . Coronary artery disease   . Depressed   . Hyperlipidemia   . Hypertension   . ICH (intracerebral hemorrhage) ([place]) 08/2017  . MI (myocardial infarction) ([place])   . OSA (obstructive sleep apnea)   . Pre-diabetes    Past Surgical History:  Procedure Laterality Date  . CARDIAC CATHETERIZATION N/A 06/26/2015   Procedure: Left Heart Cath and Coronary Angiography;  Surgeon: Burnell Blanks, MD;  Location: [facility];  Service: Cardiovascular;  Laterality: N/A;  . CARDIAC CATHETERIZATION  06/26/2015   Procedure: Coronary Stent Intervention;  Surgeon: Burnell Blanks, MD;  Location: [facility];  Service: Cardiovascular;;  . CARDIAC CATHETERIZATION  06/26/2015   Procedure: Coronary Balloon Angioplasty;  Surgeon: Burnell Blanks, MD;  Location: [facility];  Service: Cardiovascular;;  . CORONARY STENT PLACEMENT  10/2004   Current Outpatient Medications on File Prior to Visit  Medication Sig Dispense Refill  . amLODipine (NORVASC) 10 MG tablet Take 1 tablet (10 mg total) by mouth daily. 30 tablet 0  . apixaban (ELIQUIS) 5 MG TABS tablet Take 1 tablet (5 mg total) by mouth 2 (two) times daily. 60 tablet 0  . aspirin 81 MG EC tablet Take 1 tablet (81 mg total) by mouth daily. Take 4 tablets daily for next 2 days then 1  tablet daily 30 tablet 12  . finasteride (PROSCAR) 5 MG tablet TAKE 1 BY MOUTH DAILY 30 tablet 0  . FLUoxetine (PROZAC) 20 MG capsule Take 1 capsule (20 mg total) by mouth daily. 30 capsule 0  . hydrochlorothiazide (MICROZIDE) 12.5 MG capsule Take 1 capsule (12.5 mg total) by mouth daily. 30 capsule 0  . lisinopril (PRINIVIL,ZESTRIL) 20 MG tablet Take 1 tablet (20 mg total) by mouth daily. 30 tablet 0  . nicotine (NICODERM CQ - DOSED IN MG/24 HOURS) 14 mg/24hr patch Place 1 patch (14 mg total) onto the skin daily. 28 patch 0  . pantoprazole (PROTONIX) 40 MG tablet Take 1 tablet (40 mg total) by mouth daily. 30 tablet 0  . rosuvastatin (CRESTOR) 20 MG tablet Take 1 tablet (20 mg total) by mouth daily. 30 tablet 0   No current facility-administered medications on file prior to visit.    No Known Allergies Social History   Socioeconomic History  . Marital status: Divorced    Spouse name: Not on file  . Number of children: Not on file  . Years of education: Not on file  . Highest education level: Not on file  Occupational History  . Occupation: Retired Civil engineer, contracting for [employer]  . Financial resource strain: Not on file  . Food insecurity:    Worry: Not on file    Inability: Not on file  . Transportation needs:    Medical: Not on file    Non-medical: Not on file  Tobacco Use  . Smoking status: Current Every Day Smoker    Packs/day: 1.00    Years: 50.00    Pack years: 50.00  . Smokeless tobacco: Never Used  Substance and Sexual Activity  . Alcohol use: No  . Drug use: No  . Sexual activity: Not  Currently  Lifestyle  . Physical activity:    Days per week: Patient refused    Minutes per session: Patient refused  . Stress: Not on file  Relationships  . Social connections:    Talks on phone: Patient refused    Gets together: Patient refused    Attends religious service: Patient refused    Active member of club or organization: Patient refused    Attends  meetings of clubs or organizations: Patient refused    Relationship status: Patient refused  . Intimate partner violence:    Fear of current or ex partner: Not on file    Emotionally abused: Not on file    Physically abused: Not on file    Forced sexual activity: Not on file  Other Topics Concern  . Not on file  Social History Narrative  . Not on file       Review of Systems  All other systems reviewed and are negative.      Objective:   Physical Exam  Constitutional: He is oriented to person, place, and time. He appears well-developed and well-nourished. No distress.  Neck: No JVD present.  Cardiovascular: Normal rate and normal heart sounds. An irregularly irregular rhythm present.  Pulmonary/Chest: Effort normal. No stridor. No respiratory distress. He has no wheezes.  Abdominal: Soft. Bowel sounds are normal.  Musculoskeletal: He exhibits no edema.  Neurological: He is alert and oriented to person, place, and time. He displays normal reflexes. No cranial nerve deficit or sensory deficit. He exhibits abnormal muscle tone. Coordination abnormal.  Skin: He is not diaphoretic.  Vitals reviewed.   Patient has mild right-sided neglect.  He has a mild shuffling gait.  He is slightly dragging his right foot when he walks.  Compared to his last visit, the patient has new left-sided hearing loss      Assessment & Plan:  Hospital discharge follow-up- More than 50 minutes were spent with the patient and his son discussing the situation.  I am concerned that the patient still wants to die despite telling the psychiatrist that he has no suicidal thoughts.  However this is not apparently due to mental illness but rather over his concern of being a burden and his right to die with dignity.  His fear is that another stroke will rob him of that right and leave him a burden.  I explained to the patient that although his situation is extremely guarded that I do believe he has hope for a good  recovery given time.  We also discussed not making a rash decision now as his situation may improve with time and he may regret that decision in the future.  I then discussed the situation with the patient and his son.  We discussed returning to the hospital and having him involuntarily committed due to risk for suicide.  Both the patient and the son do not want this to happen.  They would like me to maximize therapy for depression and therefore I will add Abilify 5 mg a day and see the patient back in 1 week.

## 2017-10-22 ENCOUNTER — Ambulatory Visit: Payer: Medicare Other | Admitting: Family Medicine

## 2017-10-22 ENCOUNTER — Encounter: Payer: Self-pay | Admitting: Family Medicine

## 2017-10-22 VITALS — BP 126/70 | HR 80 | Temp 98.4°F | Resp 14 | Ht 72.0 in | Wt 186.0 lb

## 2017-10-22 DIAGNOSIS — G450 Vertebro-basilar artery syndrome: Secondary | ICD-10-CM

## 2017-10-22 DIAGNOSIS — I482 Chronic atrial fibrillation, unspecified: Secondary | ICD-10-CM

## 2017-10-22 DIAGNOSIS — Z8673 Personal history of transient ischemic attack (TIA), and cerebral infarction without residual deficits: Secondary | ICD-10-CM

## 2017-10-22 DIAGNOSIS — T1491XA Suicide attempt, initial encounter: Secondary | ICD-10-CM

## 2017-10-22 DIAGNOSIS — I651 Occlusion and stenosis of basilar artery: Secondary | ICD-10-CM

## 2017-10-22 MED ORDER — ROSUVASTATIN CALCIUM 20 MG PO TABS: 20.0000 mg | ORAL_TABLET | Freq: Every day | ORAL | 5 refills | Status: DC

## 2017-10-22 MED ORDER — HYDROCHLOROTHIAZIDE 12.5 MG PO CAPS: 12.5000 mg | ORAL_CAPSULE | Freq: Every day | ORAL | 5 refills | Status: DC

## 2017-10-22 MED ORDER — FINASTERIDE 5 MG PO TABS: ORAL_TABLET | ORAL | 5 refills | Status: DC

## 2017-10-22 NOTE — Progress Notes (Signed)
Subjective:    Patient ID: Eugene [name], male    DOB: 09-Jul-1940, 77 y.o.   MRN: [id number]  HPI  Unfortunately, patient was recently admitted to the hospital with symptoms of a right-sided stroke.  CT scan revealed an intracranial hemorrhage in the left thalamic tract.  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 d/t HTN in setting of Xarelto and plavix use Active Problems:   Hypertension   CAD (coronary artery disease)   Atrial fibrillation (HCC)   NSTEMI (non-ST elevated myocardial infarction) ([place])   Chronic anticoagulation   Dysmetria   Benign essential HTN   Hyperlipidemia   OSA (obstructive sleep apnea)   Diabetes mellitus type 2 in nonobese ([place])   Occlusion and stenosis of basilar artery   Hypertensive emergency   Sundowning   Hx of completed stroke      HISTORY OF PRESENT ILLNESS Eugene L [name] an 77 y.o.malewith history of hypertension, hyperlipidemiaandatrial fibrillation forwhich he is on Xarelto. At baseline thepatient is quite independent, drivesa car, was able to take care of all of his activities of daily living. However, per family he may not have been keeping up with his medications in terms of exact doses and schedules, with the patient confirming this. Modified Rankin:Rankin Score=0. Hewent to sleep last night around 10 PM (LKW 08/24/2017 at 2000) and woke around 930 this a.m., notingthat he had decreased sensation along hisright side along with significant dysarthria. For that reason [facility]was [facility], where aCT of hisheadrevealeda left thalamic bleed. Blood pressure was 157/98. Patient is not a good historian and it is unclear what his blood pressures usually are. CT shows a L thalamic ICH. Intracerebral  Hemorrhage (ICH) Score = 0. He was transferred to [facility] for further evaluation and treatment.    HOSPITAL COURSE Eugene [name] a 77 y.o.malewith history of afib on Xarelto, CAD/MI on plavix, HLD, HTN, OSA admitted for right sided numbness and slurry speech.   ICH: left thalamic small ICH likely due to HTN in the setting of Xarelto and plavix use s/p reversal with Kcentra  Resultant partial expressive aphasia, right pronator drift  CT head left thalamic ICH with old left cerebellar infarct with calcification  MRILeft thalamic ICH   MRASevere BA stenosis vs. Occlusion with distal constitution  CTA head and neck - stable hematoma, BA occlusion with distal recon, b/l VA stenosis L>R.   Repeat CT hematoma stable  2D EchoEF 55-60%  LDL101  HgbA1c6.4  clopidogrel 75 mg daily and Xarelto (rivaroxaban) dailyprior to admission, now on No antithromboticdue to [place]. Add ASA 81mg  7 days post ICH as neuro stable (09/01/17), and repeat CT in 2-3 weeks, if ICH resolved, add eliquis to ASA 81mg .   Patient counseled to be compliant withhisantithrombotic medications  Ongoing aggressive stroke risk factor management  Therapy recommendations: CIR  Disposition: CIRplanned, however, family decided on SNF when copays discussed and desire for longer rehab time, then they changed their mind back to CIR. Will d/c to CIR today.  Wheezing with SOB, resolved  Mild wheezing and SOB  CXR no pulmonary edema  EKG afib, no ST-T changes  duoneb PRN  Lasix 40mg  once  Leukocytosis - WBC 7.4->10.3->11.7->8.9  Afib on Xarelto  Tele showed persistent AFib  Rate controlled, no RVR  Hold off AC for now.   Will repeat CT in 2-3 weeks, if ICH resolves, start eliquis  CAD/MI  On plavix PTA  Hold off for now due to [place]  ASA 81mg  7 days post ICH if neuro stable (09/01/17)  BA occlusion  Confirmed on CTA and MRA  With distal constitution from  collaterals  Consistent with chronic occlusion  Start ASA on 09/01/17 if neuro stable  BP goal 120-140, avoid low BP  Hypertensive Emergency  BP as high as 171/118  Treated with Cleviprex in hospital  On home norvasc, lisinopril and metoprolol, resumed in hopsital  BP goal < 140 in hospital   Long term BP goal 120-140. Avoid hypotension due to BA occlusion, avoid hypertension due to anticoagulation use.  Need BP check closely at home  Hyperlipidemia  Home meds: crestor 20   LDL 101, goal < 70  Now on crestor 20  Continue statin at discharge  Diabetes type II  On metformin at home  HGBA1c 6.4  SSI in hosp  Glucoses stable, WNL not on metformin here  Resume metformin per rehab  Sundowning  May have underlying cognitive impairment  on Seroquel 25 nightly  Improved since on seroquel  Other Stroke Risk Factors  Hx stroke/TIA- imaging showed old left cerebellar infarct with calcification  Obstructive sleep apnea    DISCHARGE PLAN  Disposition:  Inpatient rehab for ongoing PT, OT and ST  Repeat CT head in 2-3 weeks, if hemorrhage resolved, add Eliquis to aspirin 81 for secondary stroke prevention  Ongoing risk factor control by Primary Care Physician at time of discharge  Follow-up Eugene Frizzle, MD in 2 weeks.  Follow-up in [facility] in 4 weeks, office to schedule an appointment   09/20/17 A head CT was performed on April 26 which revealed the expected evolution of his left thalamic intracranial hemorrhage.  At that time neurology cleared the patient to resume Eliquis in addition to his aspirin.  He was discharged from inpatient rehab on April 27.  He is here today for follow-up.  Prior to today, I have not seen the patient since 2017.  At that time he had suffered a non-ST elevation myocardial infarction.  This occurred while the patient was taking aspirin in addition to Pradaxa.  At that time  cardiology recommended to continue the patient on dual antiplatelet therapy in addition to his Pradaxa for 1 month then discontinue his aspirin with the plan of continuing his Plavix and Pradaxa indefinitely.  Ultimately that is how the patient was started on Plavix in addition to antithrombotic therapy although prior to his hospitalization he had been switched to Xarelto.  Patient is here today with his son who he is living with.  He is interested in resuming driving.  I walked with the patient around our clinic today.  He is dragging his right foot slightly.  He also demonstrates some right-sided neglect as he walks.  Several times he almost tripped over boxes or other items throughout the clinic near his right leg.  He also seems to have slowed reaction time.  Given these findings, I have recommended against resuming driving at the present time.  Otherwise he is doing very well.  He is not been taking Seroquel which they were giving to him in the hospital for sundowning.  His son states that he does not feel that his father needs that now that he is back at home.  He is much less confused.  He  is not demonstrating any evidence of delirium.  Furthermore he has not been taking the Enablex.  I explained to the patient that this is an anticholinergic medication.  This increases his risk for delirium and confusion as it does cross the blood-brain barrier potentially.  He states without the medication, he experiences frequency about every 3-4 hours at night.  Patient states that he can live with that at the present time.  Unfortunately he is smoking.  He has no desire to quit at the present time.  His blood pressure today is extremely low for this patient given the complexity of his medical problems.  He denies any dizziness or lightheadedness or syncope.  He is off his metformin which he was taking prior to his hospital admission.  His hemoglobin A1c on admission was 6.4 indicating excellent control.  However he is  no longer on any hypoglycemic agent.  He is also now taking Prozac 20 mg a day for depression.  However neither the son nor the father state that they feel that he needs this medication.  He is not certain why he is taking it.  This is something that was started during the hospitalization.  At that time, my plan was: I have recommended avoiding Enablex due to polypharmacy and its anticholinergic effects.  Patient will discontinue Seroquel as he has not been taking it for more than a week and does not seem to use the medication or require it.  I have recommended decreasing Prozac to 10 mg a day and reassessing in 2 to 3 weeks.  If no significant changes seen, I would discontinue the medicine altogether.  His blood pressure today is low.  Ideally I like to keep the patient between 062 and 376 systolic over 28-31 diastolic.  Patient will buy a blood pressure monitor and check his blood pressure at home.  If consistently low, I will decrease his antihypertensive medication and then recheck his blood pressure in 2 to 3 weeks.  I explained to the patient why he needs to stay on aspirin due to his history of stroke as well as his history of non-ST elevation myocardial infarction in addition to his antithrombotic therapy and the version of Eliquis given his atrial fibrillation.  However I also explained that we need to maintain strict control of his blood pressure to avoid hypertensive urgency.  Also explained the need we need to avoid hypotension given his basilar artery stenosis.  Reassess the patient in 3 weeks  10/15/17 Unfortunately the patient was recently admitted to the hospital again.  I have copied relevant portions of his discharge summary and included them below for my reference: Admit date: 10/04/2017 Discharge date: 10/10/2017  Admitted From: Home Disposition: Home  Discharge Diagnoses:  Principal Problem:   Acute ischemic cerebrovascular accident (CVA) involving anterior cerebral artery territory  [facility])   Active Problems:   Suicidal behavior with attempted self-injury ([place])   Depression   Atrial fibrillation ([place])   Diabetes mellitus type 2, noninsulin dependent ([place])   Hyperlipidemia   Occlusion and stenosis of basilar artery   Thalamic hemorrhage (HCC)   Right hemiparesis (HCC)   Stage 2 chronic kidney disease   TIA involving basilar artery   Palliative care by specialist   Goals of care, counseling/discussion  Brief narrative/HPI Please refer to admission H&P for details, in brief,77 year old male with A. fib, hypertension, BPH, anxiety/depression, tobacco abuse, recent thalamic ICH(6 weeks back) presented to the ED with vertigo, poor balance and left-sided hearing impairment with  worsening right-sided weakness. After his recent left thalamic ICH he had mild residual right upper extremity weakness. Patient symptoms occurred about 36 hours prior to presentation and due to ongoing symptoms he became frustrated and attempted to kill himself by blowing his head off with a pistol. Patient son noticed this and intervened and brought patient to the ED. CT head and MRI brain negative for acute findings but given his clinical presentation neurology recommended this is likely acute infarction of the ipsilateral AICA causing acute unilateral hearing loss and vertigo.    Hospital course  Principal problem Acute infarction of AICA Symptoms of vertigo associated with unilateral hearing loss likely due to acute infarction of ipsilateral AICA per neurology which usually is not picked up on MRI. Had hemorrhagic ICH 6 weeks.Neurology recommends full dose aspirin until 5/28 and then transition to 81 mg aspirin and Eliquis 5 mg twice daily.  (He was on this regimen at home prior to admission).Further stroke work-up not needed. Continue statin. Has residual left-sided hearing loss.  Outpatient PT.  Depression with suicidal ideation. Patient denies suicidal ideation for past 2  days.  Also does not feel depressed today.  Telemetry psych initially recommended inpatient psych admission once medically cleared.  However given no further suicidal ideation and active depression they have cleared him to be discharged home with outpatient follow-up.  Placed on fluoxetine.  Intracranial hemorrhage of the left thalamus on 4/10. Patient was on Xarelto prior to this and was transitioned to apixaban. Holding apixaban until 5/28 and currently on full dose aspirin.  Paroxysmal A. fib Rate controlled on the monitor.   Now on full dose aspirin until 5/28, thereafter he will be on his home dose of baby aspirin and apixaban.   Essential hypertension Continue amlodipine, HCTZ and lisinopril.  Acute kidney injury Mild, possibly prerenal.  HCTZ and lisinopril were held and can be resumed upon discharge.  Coronary artery disease with history of LAD stenting in 2017 Continue aspirin and statin. Intolerant to beta-blocker due to bradycardia.  Diabetes mellitus type 2 CBG stable.  Not on medication.  A1c of 6.4.     10/15/17 Patient is here today with his son.  I directly questioned the patient about his suicide attempt.  Patient is calm and very matter-of-fact.  He states that he is lived a good life.  He does not want to be a burden to his family specifically his son.  He states that when he had this most recent stroke, he became extremely afraid that he would become a vegetable and therefore burden his family.  He was also afraid that he would then not be able to do anything to help with the situation.  Therefore, the patient decided to commit suicide.  He called the sheriff's department so that they would come and find the body, he put a gun to his head and pulled the trigger.  However his son had removed the ammunition and therefore the patient was unsuccessful.  This prompted his son to take him directly to the hospital where he was admitted.  Patient continues to  endorse the same sentiment.  He states that he is not "crazy".  However his primary concern is that his situation is only going to worsen and that he will only be a burden to his son and therefore he wants to die with dignity.  However he states that he has no plan to commit suicide because all available options have been removed from him.  His son is  controlling all of his medication, the weapons have been removed from the home, and the patient has no other plan of suicide.  He is currently on Prozac.  At that time, my plan was: More than 50 minutes were spent with the patient and his son discussing the situation.  I am concerned that the patient still wants to die despite telling the psychiatrist that he has no suicidal thoughts.  However this is not apparently due to mental illness but rather over his concern of being a burden and his right to die with dignity.  His fear is that another stroke will rob him of that right and leave him a burden.  I explained to the patient that although his situation is extremely guarded that I do believe he has hope for a good recovery given time.  We also discussed not making a rash decision now as his situation may improve with time and he may regret that decision in the future.  I then discussed the situation with the patient and his son.  We discussed returning to the hospital and having him involuntarily committed due to risk for suicide.  Both the patient and the son do not want this to happen.  They would like me to maximize therapy for depression and therefore I will add Abilify 5 mg a day and see the patient back in 1 week.  10/22/17 Patient is here today with his daughter.  His daughter states that since he started the Abilify, he seems less depressed.  He is not endorsing suicide.  He is still very negative and pessimistic about his future improvement however his spirits seem to have improved based on the family's impressions.  Patient denies suicidal thoughts or  suicidal plan.  He continues to be frustrated with his inability to hear from his left ear and the weakness in his right leg and his issues with balance.  He continues to be very negative.  He frequently states that this will never get better.  He states that I will be a burden.  He states that nothing will improve.  He wants to stop his medication.  He does not want to try physical therapy.  Past Medical History:  Diagnosis Date  . Anxiety   . Atrial fibrillation ([place])   . BPH (benign prostatic hypertrophy)   . Cataracts, both eyes   . Coronary artery disease   . Depressed   . Hyperlipidemia   . Hypertension   . ICH (intracerebral hemorrhage) ([place]) 08/2017  . MI (myocardial infarction) ([place])   . OSA (obstructive sleep apnea)   . Pre-diabetes    Past Surgical History:  Procedure Laterality Date  . CARDIAC CATHETERIZATION N/A 06/26/2015   Procedure: Left Heart Cath and Coronary Angiography;  Surgeon: Burnell Blanks, MD;  Location: [facility];  Service: Cardiovascular;  Laterality: N/A;  . CARDIAC CATHETERIZATION  06/26/2015   Procedure: Coronary Stent Intervention;  Surgeon: Burnell Blanks, MD;  Location: [facility];  Service: Cardiovascular;;  . CARDIAC CATHETERIZATION  06/26/2015   Procedure: Coronary Balloon Angioplasty;  Surgeon: Burnell Blanks, MD;  Location: [facility];  Service: Cardiovascular;;  . CORONARY STENT PLACEMENT  10/2004   Current Outpatient Medications on File Prior to Visit  Medication Sig Dispense Refill  . amLODipine (NORVASC) 10 MG tablet Take 1 tablet (10 mg total) by mouth daily. 30 tablet 0  . apixaban (ELIQUIS) 5 MG TABS tablet Take 1 tablet (5 mg total) by  mouth 2 (two) times daily. 60 tablet 0  . ARIPiprazole (ABILIFY) 5 MG tablet Take 1 tablet (5 mg total) by mouth daily. 30 tablet 5  . aspirin 81 MG EC tablet Take 1 tablet (81 mg total) by mouth daily. Take 4 tablets daily for next 2 days then 1 tablet daily 30  tablet 12  . FLUoxetine (PROZAC) 20 MG capsule Take 1 capsule (20 mg total) by mouth daily. 30 capsule 0  . lisinopril (PRINIVIL,ZESTRIL) 20 MG tablet Take 1 tablet (20 mg total) by mouth daily. 30 tablet 0  . pantoprazole (PROTONIX) 40 MG tablet Take 1 tablet (40 mg total) by mouth daily. 30 tablet 0   No current facility-administered medications on file prior to visit.    No Known Allergies Social History   Socioeconomic History  . Marital status: Divorced    Spouse name: Not on file  . Number of children: Not on file  . Years of education: Not on file  . Highest education level: Not on file  Occupational History  . Occupation: Retired Civil engineer, contracting for [employer]  . Financial resource strain: Not on file  . Food insecurity:    Worry: Not on file    Inability: Not on file  . Transportation needs:    Medical: Not on file    Non-medical: Not on file  Tobacco Use  . Smoking status: Current Every Day Smoker    Packs/day: 1.00    Years: 50.00    Pack years: 50.00  . Smokeless tobacco: Never Used  Substance and Sexual Activity  . Alcohol use: No  . Drug use: No  . Sexual activity: Not Currently  Lifestyle  . Physical activity:    Days per week: Patient refused    Minutes per session: Patient refused  . Stress: Not on file  Relationships  . Social connections:    Talks on phone: Patient refused    Gets together: Patient refused    Attends religious service: Patient refused    Active member of club or organization: Patient refused    Attends meetings of clubs or organizations: Patient refused    Relationship status: Patient refused  . Intimate partner violence:    Fear of current or ex partner: Not on file    Emotionally abused: Not on file    Physically abused: Not on file    Forced sexual activity: Not on file  Other Topics Concern  . Not on file  Social History Narrative  . Not on file       Review of Systems  All other systems reviewed and are  negative.      Objective:   Physical Exam  Constitutional: He is oriented to person, place, and time. He appears well-developed and well-nourished. No distress.  Neck: No JVD present.  Cardiovascular: Normal rate and normal heart sounds. An irregularly irregular rhythm present.  Pulmonary/Chest: Effort normal. No stridor. No respiratory distress. He has no wheezes.  Abdominal: Soft. Bowel sounds are normal.  Musculoskeletal: He exhibits no edema.  Neurological: He is alert and oriented to person, place, and time. He displays normal reflexes. No cranial nerve deficit or sensory deficit. He exhibits abnormal muscle tone. Coordination abnormal.  Skin: He is not diaphoretic.  Vitals reviewed.   Patient has mild right-sided neglect.  He has a mild shuffling gait.  He is slightly dragging his right foot when he walks.  Compared to his last visit, the patient has new left-sided hearing loss  Assessment & Plan:  Hx of completed stroke - Plan: Ambulatory referral to [place]  Suicidal behavior with attempted self-injury ([place])  Occlusion and stenosis of basilar artery - Plan: Ambulatory referral to [place]  Chronic atrial fibrillation [facility])  TIA involving basilar artery  Patient's depression seems to be better however he continues to demonstrate anger and negativity over his diagnosis and his condition.  I believe he is transitioning through the stages of grieving.  As I explained to the patient and his family, I believe that his anger and negativity will gradually improve with time and I believe they will also improve as he hopefully experiences some recovery from his stroke.  Therefore I have recommended physical therapy to try to improve the strength in his right leg and his balance.  Otherwise I will make no changes in his medication at this time.  I am happy to see that his suicidal thoughts have improved per the family's report.  I will recheck the patient in 4 weeks or sooner  if problems arise

## 2017-10-25 ENCOUNTER — Telehealth: Payer: Self-pay | Admitting: *Deleted

## 2017-10-25 MED ORDER — ARIPIPRAZOLE 5 MG PO TABS: 5.0000 mg | ORAL_TABLET | Freq: Every day | ORAL | 5 refills | Status: DC

## 2017-10-25 NOTE — Telephone Encounter (Signed)
Your information has been submitted to [employer] [place]. [employer] [place] will review the request and notify you of the determination decision directly, typically within 3 business days of your submission and once all necessary information is received.  You will also receive your request decision electronically. To check for an update later, open the request again from your dashboard.  If [employer] [place] has not responded within the specified timeframe or if you have any questions about your PA submission, contact [employer] [place] directly at (MAPD) [phone] or (PDP) [phone]. 

## 2017-10-25 NOTE — Telephone Encounter (Signed)
Received PA determination.   PA approved 10/25/2017 through 10/26/2018.  Pharmacy made aware.

## 2017-10-25 NOTE — Telephone Encounter (Signed)
Received request from pharmacy for PA on Abilify.  PA submitted.   Dx: [email or web address].[place]- depression

## 2017-11-02 ENCOUNTER — Other Ambulatory Visit: Payer: Self-pay | Admitting: Family Medicine

## 2017-11-02 MED ORDER — FLUOXETINE HCL 20 MG PO CAPS: 20.0000 mg | ORAL_CAPSULE | Freq: Every day | ORAL | 5 refills | Status: DC

## 2017-11-15 ENCOUNTER — Other Ambulatory Visit: Payer: Self-pay | Admitting: Family Medicine

## 2017-11-22 ENCOUNTER — Ambulatory Visit (INDEPENDENT_AMBULATORY_CARE_PROVIDER_SITE_OTHER): Payer: Medicare Other | Admitting: Family Medicine

## 2017-11-22 ENCOUNTER — Encounter: Payer: Self-pay | Admitting: Family Medicine

## 2017-11-22 VITALS — BP 92/60 | HR 70 | Temp 98.3°F | Resp 16 | Ht 72.0 in | Wt 179.0 lb

## 2017-11-22 DIAGNOSIS — R45851 Suicidal ideations: Secondary | ICD-10-CM

## 2017-11-22 DIAGNOSIS — I651 Occlusion and stenosis of basilar artery: Secondary | ICD-10-CM | POA: Diagnosis not present

## 2017-11-22 DIAGNOSIS — G450 Vertebro-basilar artery syndrome: Secondary | ICD-10-CM | POA: Diagnosis not present

## 2017-11-22 DIAGNOSIS — I482 Chronic atrial fibrillation, unspecified: Secondary | ICD-10-CM

## 2017-11-22 DIAGNOSIS — Z8673 Personal history of transient ischemic attack (TIA), and cerebral infarction without residual deficits: Secondary | ICD-10-CM | POA: Diagnosis not present

## 2017-11-22 NOTE — Progress Notes (Signed)
Subjective:    Patient ID: Eugene [name], male    DOB: 1940/05/20, 77 y.o.   MRN: [id number]  HPI  Unfortunately, patient was recently admitted to the hospital with symptoms of a right-sided stroke.  CT scan revealed an intracranial hemorrhage in the left thalamic tract.  Eugene have copied relevant portions of the discharge summary and included them below for my reference:  Date of Admission: 08/25/2017 Date of Discharge: 08/31/2017  DISCHARGE DIAGNOSIS:  Principal Problem:   [name] (intracerebral hemorrhage) ([place]) - L thalamic d/t HTN in setting of Xarelto and plavix use Active Problems:   Eugene [name]   CAD (coronary artery disease)   Atrial fibrillation (HCC)   NSTEMI (non-ST elevated myocardial infarction) ([place])   Chronic anticoagulation   Dysmetria   Benign essential HTN   Hyperlipidemia   OSA (obstructive sleep apnea)   Diabetes mellitus type 2 in nonobese ([place])   Occlusion and stenosis of basilar artery   Hypertensive emergency   Sundowning   Hx of completed stroke      HISTORY OF PRESENT ILLNESS Eugene [name] an 77 y.o.malewith history of Eugene [name], hyperlipidemiaandatrial fibrillation forwhich he is on Xarelto. At baseline thepatient is quite independent, drivesa car, was able to take care of all of his activities of daily living. However, per family he may not have been keeping up with his medications in terms of exact doses and schedules, with the patient confirming this. Modified Rankin:Rankin Score=0. Hewent to sleep last night around 10 PM (LKW 08/24/2017 at 2000) and woke around 930 this a.m., notingthat he had decreased sensation along hisright side along with significant dysarthria. For that reason [facility]was [facility], where aCT of hisheadrevealeda left thalamic bleed. Blood pressure was 157/98. Patient is not a good historian and it is unclear what his blood pressures usually are. CT shows a L thalamic [name]. Intracerebral  Hemorrhage ([name]) Score = 0. He was transferred to [facility] for further evaluation and treatment.    HOSPITAL COURSE Eugene L [name] a 77 y.o.malewith history of afib on Xarelto, CAD/MI on plavix, HLD, HTN, OSA admitted for right sided numbness and slurry speech.   [name]: left thalamic small [name] likely due to HTN in the setting of Xarelto and plavix use s/p reversal with Kcentra  Resultant partial expressive aphasia, right pronator drift  CT head left thalamic [name] with old left cerebellar infarct with calcification  MRILeft thalamic [name]   MRASevere BA stenosis vs. Occlusion with distal constitution  CTA head and neck - stable hematoma, BA occlusion with distal recon, b/l VA stenosis L>R.   Repeat CT hematoma stable  2D EchoEF 55-60%  LDL101  HgbA1c6.4  clopidogrel 75 mg daily and Xarelto (rivaroxaban) dailyprior to admission, now on No antithromboticdue to [place]. Add ASA 81mg  7 days post [name] as neuro stable (09/01/17), and repeat CT in 2-3 weeks, if [name] resolved, add eliquis to ASA 81mg .   Patient counseled to be compliant withhisantithrombotic medications  Ongoing aggressive stroke risk factor management  Therapy recommendations: CIR  Disposition: CIRplanned, however, family decided on SNF when copays discussed and desire for longer rehab time, then they changed their mind back to CIR. Will d/c to CIR today.  Wheezing with SOB, resolved  Mild wheezing and SOB  CXR no pulmonary edema  EKG afib, no ST-T changes  duoneb PRN  Lasix 40mg  once  Leukocytosis - WBC 7.4->10.3->11.7->8.9  Afib on Xarelto  Tele showed persistent AFib  Rate controlled, no RVR  Hold off AC for now.   Will repeat CT in 2-3 weeks, if [name] resolves, start eliquis  CAD/MI  On plavix PTA  Hold off for now due to [place]  ASA 81mg  7 days post [name] if neuro stable (09/01/17)  BA occlusion  Confirmed on CTA and MRA  With distal constitution from  collaterals  Consistent with chronic occlusion  Start ASA on 09/01/17 if neuro stable  BP goal 120-140, avoid low BP  Hypertensive Emergency  BP as high as 171/118  Treated with Cleviprex in hospital  On home norvasc, lisinopril and metoprolol, resumed in hopsital  BP goal < 140 in hospital   Long term BP goal 120-140. Avoid hypotension due to BA occlusion, avoid Eugene [name] due to anticoagulation use.  Need BP check closely at home  Hyperlipidemia  Home meds: crestor 20   LDL 101, goal < 70  Now on crestor 20  Continue statin at discharge  Diabetes type II  On metformin at home  HGBA1c 6.4  SSI in hosp  Glucoses stable, WNL not on metformin here  Resume metformin per rehab  Sundowning  May have underlying cognitive impairment  on Seroquel 25 nightly  Improved since on seroquel  Other Stroke Risk Factors  Hx stroke/TIA- imaging showed old left cerebellar infarct with calcification  Obstructive sleep apnea    DISCHARGE PLAN  Disposition:  Inpatient rehab for ongoing PT, OT and ST  Repeat CT head in 2-3 weeks, if hemorrhage resolved, add Eliquis to aspirin 81 for secondary stroke prevention  Ongoing risk factor control by Primary Care Physician at time of discharge  Follow-up Eugene Frizzle, MD in 2 weeks.  Follow-up in [facility] in 4 weeks, office to schedule an appointment   09/20/17 A head CT was performed on April 26 which revealed the expected evolution of his left thalamic intracranial hemorrhage.  At that time neurology cleared the patient to resume Eliquis in addition to his aspirin.  He was discharged from inpatient rehab on April 27.  He is here today for follow-up.  Prior to today, Eugene have not seen the patient since 2017.  At that time he had suffered a non-ST elevation myocardial infarction.  This occurred while the patient was taking aspirin in addition to Pradaxa.  At that time  cardiology recommended to continue the patient on dual antiplatelet therapy in addition to his Pradaxa for 1 month then discontinue his aspirin with the plan of continuing his Plavix and Pradaxa indefinitely.  Ultimately that is how the patient was started on Plavix in addition to antithrombotic therapy although prior to his hospitalization he had been switched to Xarelto.  Patient is here today with his son who he is living with.  He is interested in resuming driving.  Eugene walked with the patient around our clinic today.  He is dragging his right foot slightly.  He also demonstrates some right-sided neglect as he walks.  Several times he almost tripped over boxes or other items throughout the clinic near his right leg.  He also seems to have slowed reaction time.  Given these findings, Eugene have recommended against resuming driving at the present time.  Otherwise he is doing very well.  He is not been taking Seroquel which they were giving to him in the hospital for sundowning.  His son states that he does not feel that his father needs that now that he is back at home.  He is much less confused.  He  is not demonstrating any evidence of delirium.  Furthermore he has not been taking the Enablex.  Eugene explained to the patient that this is an anticholinergic medication.  This increases his risk for delirium and confusion as it does cross the blood-brain barrier potentially.  He states without the medication, he experiences frequency about every 3-4 hours at night.  Patient states that he can live with that at the present time.  Unfortunately he is smoking.  He has no desire to quit at the present time.  His blood pressure today is extremely low for this patient given the complexity of his medical problems.  He denies any dizziness or lightheadedness or syncope.  He is off his metformin which he was taking prior to his hospital admission.  His hemoglobin A1c on admission was 6.4 indicating excellent control.  However he is  no longer on any hypoglycemic agent.  He is also now taking Prozac 20 mg a day for depression.  However neither the son nor the father state that they feel that he needs this medication.  He is not certain why he is taking it.  This is something that was started during the hospitalization.  At that time, my plan was: Eugene have recommended avoiding Enablex due to polypharmacy and its anticholinergic effects.  Patient will discontinue Seroquel as he has not been taking it for more than a week and does not seem to use the medication or require it.  Eugene have recommended decreasing Prozac to 10 mg a day and reassessing in 2 to 3 weeks.  If no significant changes seen, Eugene would discontinue the medicine altogether.  His blood pressure today is low.  Ideally Eugene like to keep the patient between 062 and 376 systolic over 28-31 diastolic.  Patient will buy a blood pressure monitor and check his blood pressure at home.  If consistently low, Eugene will decrease his antihypertensive medication and then recheck his blood pressure in 2 to 3 weeks.  Eugene explained to the patient why he needs to stay on aspirin due to his history of stroke as well as his history of non-ST elevation myocardial infarction in addition to his antithrombotic therapy and the version of Eliquis given his atrial fibrillation.  However Eugene also explained that we need to maintain strict control of his blood pressure to avoid hypertensive urgency.  Also explained the need we need to avoid hypotension given his basilar artery stenosis.  Reassess the patient in 3 weeks  10/15/17 Unfortunately the patient was recently admitted to the hospital again.  Eugene have copied relevant portions of his discharge summary and included them below for my reference: Admit date: 10/04/2017 Discharge date: 10/10/2017  Admitted From: Home Disposition: Home  Discharge Diagnoses:  Principal Problem:   Acute ischemic cerebrovascular accident (CVA) involving anterior cerebral artery territory  [facility])   Active Problems:   Suicidal behavior with attempted self-injury ([place])   Depression   Atrial fibrillation ([place])   Diabetes mellitus type 2, noninsulin dependent ([place])   Hyperlipidemia   Occlusion and stenosis of basilar artery   Thalamic hemorrhage (HCC)   Right hemiparesis (HCC)   Stage 2 chronic kidney disease   TIA involving basilar artery   Palliative care by specialist   Goals of care, counseling/discussion  Brief narrative/HPI Please refer to admission H&P for details, in brief,77 year old male with A. fib, Eugene [name], Eugene [name], Eugene [name], Eugene [name], Eugene [name](6 weeks back) presented to the ED with vertigo, poor balance and left-sided hearing impairment with  worsening right-sided weakness. After his Eugene left thalamic [name] he had mild residual right upper extremity weakness. Patient symptoms occurred about 36 hours prior to presentation and due to ongoing symptoms he became frustrated and attempted to kill himself by blowing his head off with a pistol. Patient son noticed this and intervened and brought patient to the ED. CT head and MRI brain negative for acute findings but given his clinical presentation neurology recommended this is likely acute infarction of the ipsilateral AICA causing acute unilateral hearing loss and vertigo.    Hospital course  Principal problem Acute infarction of AICA Symptoms of vertigo associated with unilateral hearing loss likely due to acute infarction of ipsilateral AICA per neurology which usually is not picked up on MRI. Had hemorrhagic [name] 6 weeks.Neurology recommends full dose aspirin until 5/28 and then transition to 81 mg aspirin and Eliquis 5 mg twice daily.  (He was on this regimen at home prior to admission).Further stroke work-up not needed. Continue statin. Has residual left-sided hearing loss.  Outpatient PT.  Depression with suicidal ideation. Patient denies suicidal ideation for past 2  days.  Also does not feel depressed today.  Telemetry psych initially recommended inpatient psych admission once medically cleared.  However given no further suicidal ideation and active depression they have cleared him to be discharged home with outpatient follow-up.  Placed on fluoxetine.  Intracranial hemorrhage of the left thalamus on 4/10. Patient was on Xarelto prior to this and was transitioned to apixaban. Holding apixaban until 5/28 and currently on full dose aspirin.  Paroxysmal A. fib Rate controlled on the monitor.   Now on full dose aspirin until 5/28, thereafter he will be on his home dose of baby aspirin and apixaban.   Essential Eugene [name] Continue amlodipine, HCTZ and lisinopril.  Acute kidney injury Mild, possibly prerenal.  HCTZ and lisinopril were held and can be resumed upon discharge.  Coronary artery disease with history of LAD stenting in 2017 Continue aspirin and statin. Intolerant to beta-blocker due to bradycardia.  Diabetes mellitus type 2 CBG stable.  Not on medication.  A1c of 6.4.     10/15/17 Patient is here today with his son.  Eugene directly questioned the patient about his suicide attempt.  Patient is calm and very matter-of-fact.  He states that he is lived a good life.  He does not want to be a burden to his family specifically his son.  He states that when he had this most Eugene stroke, he became extremely afraid that he would become a vegetable and therefore burden his family.  He was also afraid that he would then not be able to do anything to help with the situation.  Therefore, the patient decided to commit suicide.  He called the sheriff's department so that they would come and find the body, he put a gun to his head and pulled the trigger.  However his son had removed the ammunition and therefore the patient was unsuccessful.  This prompted his son to take him directly to the hospital where he was admitted.  Patient continues to  endorse the same sentiment.  He states that he is not "crazy".  However his primary concern is that his situation is only going to worsen and that he will only be a burden to his son and therefore he wants to die with dignity.  However he states that he has no plan to commit suicide because all available options have been removed from him.  His son is  controlling all of his medication, the weapons have been removed from the home, and the patient has no other plan of suicide.  He is currently on Prozac.  At that time, my plan was: More than 50 minutes were spent with the patient and his son discussing the situation.  Eugene am concerned that the patient still wants to die despite telling the psychiatrist that he has no suicidal thoughts.  However this is not apparently due to mental illness but rather over his concern of being a burden and his right to die with dignity.  His fear is that another stroke will rob him of that right and leave him a burden.  Eugene explained to the patient that although his situation is extremely guarded that I do believe he has hope for a good recovery given time.  We also discussed not making a rash decision now as his situation may improve with time and he may regret that decision in the future.  Eugene then discussed the situation with the patient and his son.  We discussed returning to the hospital and having him involuntarily committed due to risk for suicide.  Both the patient and the son do not want this to happen.  They would like me to maximize therapy for depression and therefore Eugene will add Abilify 5 mg a day and see the patient back in 1 week.  10/22/17 Patient is here today with his daughter.  His daughter states that since he started the Abilify, he seems less depressed.  He is not endorsing suicide.  He is still very negative and pessimistic about his future improvement however his spirits seem to have improved based on the family's impressions.  Patient denies suicidal thoughts or  suicidal plan.  He continues to be frustrated with his inability to hear from his left ear and the weakness in his right leg and his issues with balance.  He continues to be very negative.  He frequently states that this will never get better.  He states that Eugene will be a burden.  He states that nothing will improve.  He wants to stop his medication.  He does not want to try physical therapy.  At that time, my plan was: Patient's depression seems to be better however he continues to demonstrate anger and negativity over his diagnosis and his condition.  Eugene believe he is transitioning through the stages of grieving.  As Eugene explained to the patient and his family, Eugene believe that his anger and negativity will gradually improve with time and Eugene believe they will also improve as he hopefully experiences some recovery from his stroke.  Therefore Eugene have recommended physical therapy to try to improve the strength in his right leg and his balance.  Otherwise Eugene will make no changes in his medication at this time.  Eugene am happy to see that his suicidal thoughts have improved per the family's report.  Eugene will recheck the patient in 4 weeks or sooner if problems arise  11/22/17 Patient is doing much better.  He is smiling today on his exam.  He states he feels better.  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  He states that he no longer feels as depressed.  He denies any suicidal ideation.  His son accompanies him today and states that his father is doing much better.  He no longer fears that his father is depressed or suicidal.  Patient  states that for more than a month now, he is feeling more optimistic and seeing his body gradually recover.  His only lingering neurologic deficit is some mild hearing loss in his left ear and some mild weakness with right hip flexion.  However Eugene walked with the patient today around the office and he is able to walk up and down the hallways with no  difficulty.  He can literally turn on a dime without losing his balance.  He has normal strength in his upper extremities.  He has no visible neurologic deficit.  Past Medical History:  Diagnosis Date  . Anxiety   . Atrial fibrillation ([place])   . Eugene [name] (benign prostatic hypertrophy)   . Cataracts, both eyes   . Coronary artery disease   . Depressed   . Hyperlipidemia   . Eugene [name]   . [name] (intracerebral hemorrhage) ([place]) 08/2017  . MI (myocardial infarction) ([place])   . OSA (obstructive sleep apnea)   . Pre-diabetes    Past Surgical History:  Procedure Laterality Date  . CARDIAC CATHETERIZATION N/A 06/26/2015   Procedure: Left Heart Cath and Coronary Angiography;  Surgeon: Burnell Blanks, MD;  Location: [place] [facility];  Service: Cardiovascular;  Laterality: N/A;  . CARDIAC CATHETERIZATION  06/26/2015   Procedure: Coronary Stent Intervention;  Surgeon: Burnell Blanks, MD;  Location: [place] [facility];  Service: Cardiovascular;;  . CARDIAC CATHETERIZATION  06/26/2015   Procedure: Coronary Balloon Angioplasty;  Surgeon: Burnell Blanks, MD;  Location: [place] [facility];  Service: Cardiovascular;;  . CORONARY STENT PLACEMENT  10/2004   Current Outpatient Medications on File Prior to Visit  Medication Sig Dispense Refill  . amLODipine (NORVASC) 10 MG tablet Take 1 tablet (10 mg total) by mouth daily. 30 tablet 0  . ARIPiprazole (ABILIFY) 5 MG tablet Take 1 tablet (5 mg total) by mouth daily. 30 tablet 5  . aspirin 81 MG EC tablet Take 1 tablet (81 mg total) by mouth daily. Take 4 tablets daily for next 2 days then 1 tablet daily 30 tablet 12  . ELIQUIS 5 MG TABS tablet TAKE 1 TABLET(5 MG) BY MOUTH TWICE DAILY 60 tablet 5  . finasteride (PROSCAR) 5 MG tablet TAKE 1 BY MOUTH DAILY 30 tablet 5  . FLUoxetine (PROZAC) 20 MG capsule Take 1 capsule (20 mg total) by mouth daily. 30 capsule 5  . hydrochlorothiazide (MICROZIDE) 12.5 MG capsule Take 1 capsule (12.5 mg total)  by mouth daily. 30 capsule 5  . lisinopril (PRINIVIL,ZESTRIL) 20 MG tablet TAKE 1 TABLET(20 MG) BY MOUTH DAILY 90 tablet 3  . pantoprazole (PROTONIX) 40 MG tablet Take 1 tablet (40 mg total) by mouth daily. 30 tablet 0  . rosuvastatin (CRESTOR) 20 MG tablet Take 1 tablet (20 mg total) by mouth daily. 30 tablet 5   No current facility-administered medications on file prior to visit.    No Known Allergies Social History   Socioeconomic History  . Marital status: Divorced    Spouse name: Not on file  . Number of children: Not on file  . Years of education: Not on file  . Highest education level: Not on file  Occupational History  . Occupation: Retired Civil engineer, contracting for [employer]  . Financial resource strain: Not on file  . Food insecurity:    Worry: Not on file    Inability: Not on file  . Transportation needs:    Medical: Not on file    Non-medical: Not on  file  Eugene Use  . Smoking status: Current Every Day Smoker    Packs/day: 1.00    Years: 50.00    Pack years: 50.00  . Smokeless Eugene: Never Used  Substance and Sexual Activity  . Alcohol use: No  . Drug use: No  . Sexual activity: Not Currently  Lifestyle  . Physical activity:    Days per week: Patient refused    Minutes per session: Patient refused  . Stress: Not on file  Relationships  . Social connections:    Talks on phone: Patient refused    Gets together: Patient refused    Attends religious service: Patient refused    Active member of club or organization: Patient refused    Attends meetings of clubs or organizations: Patient refused    Relationship status: Patient refused  . Intimate partner violence:    Fear of current or ex partner: Not on file    Emotionally abused: Not on file    Physically abused: Not on file    Forced sexual activity: Not on file  Other Topics Concern  . Not on file  Social History Narrative  . Not on file       Review of Systems  All other systems  reviewed and are negative.      Objective:   Physical Exam  Constitutional: He is oriented to person, place, and time. He appears well-developed and well-nourished. No distress.  Neck: No JVD present.  Cardiovascular: Normal rate and normal heart sounds. An irregularly irregular rhythm present.  Pulmonary/Chest: Effort normal. No stridor. No respiratory distress. He has no wheezes.  Abdominal: Soft. Bowel sounds are normal.  Musculoskeletal: He exhibits no edema.  Neurological: He is alert and oriented to person, place, and time. He displays normal reflexes. No cranial nerve deficit or sensory deficit. He exhibits normal muscle tone. Coordination is normal.  Skin: He is not diaphoretic.  Vitals reviewed.        Assessment & Plan:   Hx of completed stroke  Occlusion and stenosis of basilar artery  Chronic atrial fibrillation (HCC)  TIA involving basilar artery  Suicidal ideation  Eugene am extremely happy with the progress the patient is made.  The suicidal ideation has completely resolved now for more than a month.  The patient is smiling and optimistic about his future.  He is requesting to be able to drive again.  Medically there is no contraindication to the patient driving.  He has full control of his arms and his legs.  He is walking without a cane and his balance is normal.  His hearing is sufficient and his vision is sufficient to allow him to drive.  From the standpoint of his depression, he appears to be in complete remission.  He denies any suicidal ideation.  His son states that he has no concern and that he believes his father is recovering well and almost back to normal.  His son would like him to be able to drive as well.  He is still living with his father as he has been ever since his stroke.  We did discuss his father living independently.  Eugene would like to give it another month or so with continued improvement and stability prior to the son allowing his father to live  independently but at the present time Eugene am very optimistic about his future.  Reassess the patient in 2 to 3 months

## 2017-12-05 ENCOUNTER — Emergency Department (HOSPITAL_COMMUNITY)
Admission: EM | Admit: 2017-12-05 | Discharge: 2017-12-05 | Disposition: A | Discharge status: Home / Self Care | Payer: Medicare Other | Attending: Emergency Medicine | Admitting: Emergency Medicine

## 2017-12-05 ENCOUNTER — Encounter (HOSPITAL_COMMUNITY): Payer: Self-pay | Admitting: Emergency Medicine

## 2017-12-05 ENCOUNTER — Other Ambulatory Visit: Payer: Self-pay

## 2017-12-05 DIAGNOSIS — R4182 Altered mental status, unspecified: Secondary | ICD-10-CM | POA: Insufficient documentation

## 2017-12-05 DIAGNOSIS — Z5321 Procedure and treatment not carried out due to patient leaving prior to being seen by health care provider: Secondary | ICD-10-CM | POA: Diagnosis not present

## 2017-12-05 HISTORY — DX: Cerebral infarction, unspecified: I63.9

## 2017-12-05 LAB — CBG MONITORING, ED: Glucose-Capillary: 96 mg/dL (ref 70–99)

## 2017-12-05 NOTE — ED Triage Notes (Signed)
Pt's son reports to registration that he is wanting to leave because his father feels better. Triage RN and Nurse First RN spoke with pt and son and they both are reporting that pt is feeling better and his thoughts are "calming down" now that he has ate a cracker and drank some soda while waiting in the waiting room. Vital signs updated, WNL. Pt encouraged to stay to get checked out, but pt and son really want to leave. They were encouraged by RN to come back to ED if symptoms return or pt becomes disoriented.

## 2017-12-05 NOTE — ED Triage Notes (Signed)
Patient c/o racing thoughts and inability to concentrate. Per patient a "flood of thoughts." Patient alert and oriented. Patient denies any visual or auditory hallucinations. Denies any SI or HI. Denies any neurological deficits. Family member states patient's blood pressure was a little elevated today at 176/95. When asked if anything in his life has changed or been altered recently, patient states he quit smoking.

## 2017-12-07 LAB — HM DIABETES EYE EXAM

## 2017-12-14 ENCOUNTER — Ambulatory Visit: Payer: Medicare Other | Admitting: Family Medicine

## 2017-12-14 ENCOUNTER — Encounter: Payer: Self-pay | Admitting: Family Medicine

## 2017-12-14 VITALS — BP 120/70 | HR 83 | Temp 98.2°F | Resp 16 | Ht 72.0 in | Wt 176.0 lb

## 2017-12-14 DIAGNOSIS — F321 Major depressive disorder, single episode, moderate: Secondary | ICD-10-CM | POA: Diagnosis not present

## 2017-12-14 NOTE — Progress Notes (Signed)
Subjective:    Patient ID: Eugene [name], male    DOB: 27-Oct-1940, 77 y.o.   MRN: [id number]  HPI  Unfortunately, patient was recently admitted to the hospital with symptoms of a right-sided stroke.  CT scan revealed an intracranial hemorrhage in the left thalamic tract.  I have copied relevant portions of the discharge summary and included them below for my reference:  Date of Admission: 08/25/2017 Date of Discharge: 08/31/2017  DISCHARGE DIAGNOSIS:  Principal Problem:   ICH (intracerebral hemorrhage) ([place]) - L thalamic d/t HTN in setting of Xarelto and plavix use Active Problems:   Hypertension   CAD (coronary artery disease)   Atrial fibrillation (HCC)   NSTEMI (non-ST elevated myocardial infarction) ([place])   Chronic anticoagulation   Dysmetria   Benign essential HTN   Hyperlipidemia   OSA (obstructive sleep apnea)   Diabetes mellitus type 2 in nonobese ([place])   Occlusion and stenosis of basilar artery   Hypertensive emergency   Sundowning   Hx of completed stroke      HISTORY OF PRESENT ILLNESS Eugene [name] an 77 y.o.malewith history of hypertension, hyperlipidemiaandatrial fibrillation forwhich he is on Xarelto. At baseline thepatient is quite independent, drivesa car, was able to take care of all of his activities of daily living. However, per family he may not have been keeping up with his medications in terms of exact doses and schedules, with the patient confirming this. Modified Rankin:Rankin Score=0. Hewent to sleep last night around 10 PM (LKW 08/24/2017 at 2000) and woke around 930 this a.m., notingthat he had decreased sensation along hisright side along with significant dysarthria. For that reason hewas seen at the [facility] ED, where aCT of hisheadrevealeda left thalamic bleed. Blood pressure was 157/98. Patient is not a good historian and it is unclear what his blood pressures usually are. CT shows a L thalamic ICH. Intracerebral  Hemorrhage (ICH) Score = 0. He was transferred to [facility] for further evaluation and treatment.    HOSPITAL COURSE Eugene [name] a 77 y.o.malewith history of afib on Xarelto, CAD/MI on plavix, HLD, HTN, OSA admitted for right sided numbness and slurry speech.   ICH: left thalamic small ICH likely due to HTN in the setting of Xarelto and plavix use s/p reversal with Kcentra  Resultant partial expressive aphasia, right pronator drift  CT head left thalamic ICH with old left cerebellar infarct with calcification  MRILeft thalamic ICH   MRASevere BA stenosis vs. Occlusion with distal constitution  CTA head and neck - stable hematoma, BA occlusion with distal recon, b/l VA stenosis L>R.   Repeat CT hematoma stable  2D EchoEF 55-60%  LDL101  HgbA1c6.4  clopidogrel 75 mg daily and Xarelto (rivaroxaban) dailyprior to admission, now on No antithromboticdue to [place]. Add ASA 81mg  7 days post ICH as neuro stable (09/01/17), and repeat CT in 2-3 weeks, if ICH resolved, add eliquis to ASA 81mg .   Patient counseled to be compliant withhisantithrombotic medications  Ongoing aggressive stroke risk factor management  Therapy recommendations: CIR  Disposition: CIRplanned, however, family decided on SNF when copays discussed and desire for longer rehab time, then they changed their mind back to CIR. Will d/c to CIR today.  Wheezing with SOB, resolved  Mild wheezing and SOB  CXR no pulmonary edema  EKG afib, no ST-T changes  duoneb PRN  Lasix 40mg  once  Leukocytosis - WBC 7.4->10.3->11.7->8.9  Afib on Xarelto  Tele showed persistent AFib  Rate controlled, no RVR  Hold off AC for now.   Will repeat CT in 2-3 weeks, if ICH resolves, start eliquis  CAD/MI  On plavix PTA  Hold off for now due to [place]  ASA 81mg  7 days post ICH if neuro stable (09/01/17)  BA occlusion  Confirmed on CTA and MRA  With distal constitution from  collaterals  Consistent with chronic occlusion  Start ASA on 09/01/17 if neuro stable  BP goal 120-140, avoid low BP  Hypertensive Emergency  BP as high as 171/118  Treated with Cleviprex in hospital  On home norvasc, lisinopril and metoprolol, resumed in hopsital  BP goal < 140 in hospital   Long term BP goal 120-140. Avoid hypotension due to BA occlusion, avoid hypertension due to anticoagulation use.  Need BP check closely at home  Hyperlipidemia  Home meds: crestor 20   LDL 101, goal < 70  Now on crestor 20  Continue statin at discharge  Diabetes type II  On metformin at home  HGBA1c 6.4  SSI in hosp  Glucoses stable, WNL not on metformin here  Resume metformin per rehab  Sundowning  May have underlying cognitive impairment  on Seroquel 25 nightly  Improved since on seroquel  Other Stroke Risk Factors  Hx stroke/TIA- imaging showed old left cerebellar infarct with calcification  Obstructive sleep apnea    DISCHARGE PLAN  Disposition:  Inpatient rehab for ongoing PT, OT and ST  Repeat CT head in 2-3 weeks, if hemorrhage resolved, add Eliquis to aspirin 81 for secondary stroke prevention  Ongoing risk factor control by Primary Care Physician at time of discharge  Follow-up Susy Frizzle, MD in 2 weeks.  Follow-up in [facility] in 4 weeks, office to schedule an appointment   09/20/17 A head CT was performed on April 26 which revealed the expected evolution of his left thalamic intracranial hemorrhage.  At that time neurology cleared the patient to resume Eliquis in addition to his aspirin.  He was discharged from inpatient rehab on April 27.  He is here today for follow-up.  Prior to today, I have not seen the patient since 2017.  At that time he had suffered a non-ST elevation myocardial infarction.  This occurred while the patient was taking aspirin in addition to Pradaxa.  At that time  cardiology recommended to continue the patient on dual antiplatelet therapy in addition to his Pradaxa for 1 month then discontinue his aspirin with the plan of continuing his Plavix and Pradaxa indefinitely.  Ultimately that is how the patient was started on Plavix in addition to antithrombotic therapy although prior to his hospitalization he had been switched to Xarelto.  Patient is here today with his son who he is living with.  He is interested in resuming driving.  I walked with the patient around our clinic today.  He is dragging his right foot slightly.  He also demonstrates some right-sided neglect as he walks.  Several times he almost tripped over boxes or other items throughout the clinic near his right leg.  He also seems to have slowed reaction time.  Given these findings, I have recommended against resuming driving at the present time.  Otherwise he is doing very well.  He is not been taking Seroquel which they were giving to him in the hospital for sundowning.  His son states that he does not feel that his father needs that now that he is back at home.  He is much less confused.  He  is not demonstrating any evidence of delirium.  Furthermore he has not been taking the Enablex.  I explained to the patient that this is an anticholinergic medication.  This increases his risk for delirium and confusion as it does cross the blood-brain barrier potentially.  He states without the medication, he experiences frequency about every 3-4 hours at night.  Patient states that he can live with that at the present time.  Unfortunately he is smoking.  He has no desire to quit at the present time.  His blood pressure today is extremely low for this patient given the complexity of his medical problems.  He denies any dizziness or lightheadedness or syncope.  He is off his metformin which he was taking prior to his hospital admission.  His hemoglobin A1c on admission was 6.4 indicating excellent control.  However he is  no longer on any hypoglycemic agent.  He is also now taking Prozac 20 mg a day for depression.  However neither the son nor the father state that they feel that he needs this medication.  He is not certain why he is taking it.  This is something that was started during the hospitalization.  At that time, my plan was: I have recommended avoiding Enablex due to polypharmacy and its anticholinergic effects.  Patient will discontinue Seroquel as he has not been taking it for more than a week and does not seem to use the medication or require it.  I have recommended decreasing Prozac to 10 mg a day and reassessing in 2 to 3 weeks.  If no significant changes seen, I would discontinue the medicine altogether.  His blood pressure today is low.  Ideally I like to keep the patient between 062 and 376 systolic over 28-31 diastolic.  Patient will buy a blood pressure monitor and check his blood pressure at home.  If consistently low, I will decrease his antihypertensive medication and then recheck his blood pressure in 2 to 3 weeks.  I explained to the patient why he needs to stay on aspirin due to his history of stroke as well as his history of non-ST elevation myocardial infarction in addition to his antithrombotic therapy and the version of Eliquis given his atrial fibrillation.  However I also explained that we need to maintain strict control of his blood pressure to avoid hypertensive urgency.  Also explained the need we need to avoid hypotension given his basilar artery stenosis.  Reassess the patient in 3 weeks  10/15/17 Unfortunately the patient was recently admitted to the hospital again.  I have copied relevant portions of his discharge summary and included them below for my reference: Admit date: 10/04/2017 Discharge date: 10/10/2017  Admitted From: Home Disposition: Home  Discharge Diagnoses:  Principal Problem:   Acute ischemic cerebrovascular accident (CVA) involving anterior cerebral artery territory  [facility])   Active Problems:   Suicidal behavior with attempted self-injury ([place])   Depression   Atrial fibrillation ([place])   Diabetes mellitus type 2, noninsulin dependent ([place])   Hyperlipidemia   Occlusion and stenosis of basilar artery   Thalamic hemorrhage (HCC)   Right hemiparesis (HCC)   Stage 2 chronic kidney disease   TIA involving basilar artery   Palliative care by specialist   Goals of care, counseling/discussion  Brief narrative/HPI Please refer to admission H&P for details, in brief,77 year old male with A. fib, hypertension, BPH, anxiety/depression, tobacco abuse, recent thalamic ICH(6 weeks back) presented to the ED with vertigo, poor balance and left-sided hearing impairment with  worsening right-sided weakness. After his recent left thalamic ICH he had mild residual right upper extremity weakness. Patient symptoms occurred about 36 hours prior to presentation and due to ongoing symptoms he became frustrated and attempted to kill himself by blowing his head off with a pistol. Patient son noticed this and intervened and brought patient to the ED. CT head and MRI brain negative for acute findings but given his clinical presentation neurology recommended this is likely acute infarction of the ipsilateral AICA causing acute unilateral hearing loss and vertigo.    Hospital course  Principal problem Acute infarction of AICA Symptoms of vertigo associated with unilateral hearing loss likely due to acute infarction of ipsilateral AICA per neurology which usually is not picked up on MRI. Had hemorrhagic ICH 6 weeks.Neurology recommends full dose aspirin until 5/28 and then transition to 81 mg aspirin and Eliquis 5 mg twice daily.  (He was on this regimen at home prior to admission).Further stroke work-up not needed. Continue statin. Has residual left-sided hearing loss.  Outpatient PT.  Depression with suicidal ideation. Patient denies suicidal ideation for past 2  days.  Also does not feel depressed today.  Telemetry psych initially recommended inpatient psych admission once medically cleared.  However given no further suicidal ideation and active depression they have cleared him to be discharged home with outpatient follow-up.  Placed on fluoxetine.  Intracranial hemorrhage of the left thalamus on 4/10. Patient was on Xarelto prior to this and was transitioned to apixaban. Holding apixaban until 5/28 and currently on full dose aspirin.  Paroxysmal A. fib Rate controlled on the monitor.   Now on full dose aspirin until 5/28, thereafter he will be on his home dose of baby aspirin and apixaban.   Essential hypertension Continue amlodipine, HCTZ and lisinopril.  Acute kidney injury Mild, possibly prerenal.  HCTZ and lisinopril were held and can be resumed upon discharge.  Coronary artery disease with history of LAD stenting in 2017 Continue aspirin and statin. Intolerant to beta-blocker due to bradycardia.  Diabetes mellitus type 2 CBG stable.  Not on medication.  A1c of 6.4.     10/15/17 Patient is here today with his son.  I directly questioned the patient about his suicide attempt.  Patient is calm and very matter-of-fact.  He states that he is lived a good life.  He does not want to be a burden to his family specifically his son.  He states that when he had this most recent stroke, he became extremely afraid that he would become a vegetable and therefore burden his family.  He was also afraid that he would then not be able to do anything to help with the situation.  Therefore, the patient decided to commit suicide.  He called the sheriff's department so that they would come and find the body, he put a gun to his head and pulled the trigger.  However his son had removed the ammunition and therefore the patient was unsuccessful.  This prompted his son to take him directly to the hospital where he was admitted.  Patient continues to  endorse the same sentiment.  He states that he is not "crazy".  However his primary concern is that his situation is only going to worsen and that he will only be a burden to his son and therefore he wants to die with dignity.  However he states that he has no plan to commit suicide because all available options have been removed from him.  His son is  controlling all of his medication, the weapons have been removed from the home, and the patient has no other plan of suicide.  He is currently on Prozac.  At that time, my plan was: More than 50 minutes were spent with the patient and his son discussing the situation.  I am concerned that the patient still wants to die despite telling the psychiatrist that he has no suicidal thoughts.  However this is not apparently due to mental illness but rather over his concern of being a burden and his right to die with dignity.  His fear is that another stroke will rob him of that right and leave him a burden.  I explained to the patient that although his situation is extremely guarded that I do believe he has hope for a good recovery given time.  We also discussed not making a rash decision now as his situation may improve with time and he may regret that decision in the future.  I then discussed the situation with the patient and his son.  We discussed returning to the hospital and having him involuntarily committed due to risk for suicide.  Both the patient and the son do not want this to happen.  They would like me to maximize therapy for depression and therefore I will add Abilify 5 mg a day and see the patient back in 1 week.  10/22/17 Patient is here today with his daughter.  His daughter states that since he started the Abilify, he seems less depressed.  He is not endorsing suicide.  He is still very negative and pessimistic about his future improvement however his spirits seem to have improved based on the family's impressions.  Patient denies suicidal thoughts or  suicidal plan.  He continues to be frustrated with his inability to hear from his left ear and the weakness in his right leg and his issues with balance.  He continues to be very negative.  He frequently states that this will never get better.  He states that I will be a burden.  He states that nothing will improve.  He wants to stop his medication.  He does not want to try physical therapy.  At that time, my plan was: Patient's depression seems to be better however he continues to demonstrate anger and negativity over his diagnosis and his condition.  I believe he is transitioning through the stages of grieving.  As I explained to the patient and his family, I believe that his anger and negativity will gradually improve with time and I believe they will also improve as he hopefully experiences some recovery from his stroke.  Therefore I have recommended physical therapy to try to improve the strength in his right leg and his balance.  Otherwise I will make no changes in his medication at this time.  I am happy to see that his suicidal thoughts have improved per the family's report.  I will recheck the patient in 4 weeks or sooner if problems arise  11/22/17 Patient is doing much better.  He is smiling today on his exam.  He states he feels better.  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  He states that he no longer feels as depressed.  He denies any suicidal ideation.  His son accompanies him today and states that his father is doing much better.  He no longer fears that his father is depressed or suicidal.  Patient states that for more than a month now, he is feeling more optimistic and seeing his body gradually recover.  His only lingering neurologic deficit is some mild hearing loss in his left ear and some mild weakness with right hip flexion.  However I walked with the patient today around the office and he is able to walk up and down the hallways with no  difficulty.  He can literally turn on a dime without losing his balance.  He has normal strength in his upper extremities.  He has no visible neurologic deficit.  At that time, my plan was: I am extremely happy with the progress the patient is made.  The suicidal ideation has completely resolved now for more than a month.  The patient is smiling and optimistic about his future.  He is requesting to be able to drive again.  Medically there is no contraindication to the patient driving.  He has full control of his arms and his legs.  He is walking without a cane and his balance is normal.  His hearing is sufficient and his vision is sufficient to allow him to drive.  From the standpoint of his depression, he appears to be in complete remission.  He denies any suicidal ideation.  His son states that he has no concern and that he believes his father is recovering well and almost back to normal.  His son would like him to be able to drive as well.  He is still living with his father as he has been ever since his stroke.  We did discuss his father living independently.  I would like to give it another month or so with continued improvement and stability prior to the son allowing his father to live independently but at the present time I am very optimistic about his future.  Reassess the patient in 2 to 3 months  12/14/17 Since I last saw the patient, there has been a deterioration in his condition.  He reports more pessimism, less energy, decreased appetite, weight loss, he has lost 14 pounds since May.  Both he and his son report depression.  His son is concerned because last night he asked him about the location of his guns.  This was a conversation he was having prior to thoughts of suicide.  Son is concerned that his depression is worsening.  Patient denies any suicidal plan.  He does emphasize that he does not want to be a burden and that he has had a long life and he is also frustrated by his lack of improvement.   When I question his lack of improvement, he reports trouble with word finding, some mild weakness in his right upper extremity where he does have some slight pronator drift as well as difficulty with balance.  He is not driving and he is still living with his family.  Past Medical History:  Diagnosis Date  . Anxiety   . Atrial fibrillation ([place])   . BPH (benign prostatic hypertrophy)   . Cataracts, both eyes   . Coronary artery disease   . Depressed   . Hyperlipidemia   . Hypertension   . ICH (intracerebral hemorrhage) ([place]) 08/2017  . MI (myocardial infarction) ([place])   . OSA (obstructive sleep apnea)   . Pre-diabetes   . Stroke [facility])    Past Surgical History:  Procedure Laterality Date  . CARDIAC CATHETERIZATION N/A 06/26/2015   Procedure: Left Heart Cath and Coronary Angiography;  Surgeon: Burnell Blanks, MD;  Location: [place] [facility];  Service: Cardiovascular;  Laterality: N/A;  . CARDIAC CATHETERIZATION  06/26/2015   Procedure: Coronary Stent Intervention;  Surgeon: Burnell Blanks, MD;  Location: [place] [facility];  Service: Cardiovascular;;  . CARDIAC CATHETERIZATION  06/26/2015   Procedure: Coronary Balloon Angioplasty;  Surgeon: Burnell Blanks, MD;  Location: [place] [facility];  Service: Cardiovascular;;  . CORONARY STENT PLACEMENT  10/2004   Current Outpatient Medications on File Prior to Visit  Medication Sig Dispense Refill  . amLODipine (NORVASC) 10 MG tablet Take 1 tablet (10 mg total) by mouth daily. 30 tablet 0  . ARIPiprazole (ABILIFY) 5 MG tablet Take 1 tablet (5 mg total) by mouth daily. 30 tablet 5  . aspirin 81 MG EC tablet Take 1 tablet (81 mg total) by mouth daily. Take 4 tablets daily for next 2 days then 1 tablet daily 30 tablet 12  . ELIQUIS 5 MG TABS tablet TAKE 1 TABLET(5 MG) BY MOUTH TWICE DAILY 60 tablet 5  . finasteride (PROSCAR) 5 MG tablet TAKE 1 BY MOUTH DAILY 30 tablet 5  . FLUoxetine (PROZAC) 20 MG capsule Take 1 capsule  (20 mg total) by mouth daily. 30 capsule 5  . hydrochlorothiazide (MICROZIDE) 12.5 MG capsule Take 1 capsule (12.5 mg total) by mouth daily. 30 capsule 5  . lisinopril (PRINIVIL,ZESTRIL) 20 MG tablet TAKE 1 TABLET(20 MG) BY MOUTH DAILY 90 tablet 3  . pantoprazole (PROTONIX) 40 MG tablet Take 1 tablet (40 mg total) by mouth daily. 30 tablet 0  . rosuvastatin (CRESTOR) 20 MG tablet Take 1 tablet (20 mg total) by mouth daily. 30 tablet 5   No current facility-administered medications on file prior to visit.    No Known Allergies Social History   Socioeconomic History  . Marital status: Divorced    Spouse name: Not on file  . Number of children: Not on file  . Years of education: Not on file  . Highest education level: Not on file  Occupational History  . Occupation: Retired Civil engineer, contracting for [employer]  . Financial resource strain: Not on file  . Food insecurity:    Worry: Not on file    Inability: Not on file  . Transportation needs:    Medical: Not on file    Non-medical: Not on file  Tobacco Use  . Smoking status: Former Smoker    Packs/day: 1.00    Years: 50.00    Pack years: 50.00    Types: Cigarettes    Last attempt to quit: 11/28/2017    Years since quitting: 0.0  . Smokeless tobacco: Never Used  Substance and Sexual Activity  . Alcohol use: No  . Drug use: No  . Sexual activity: Not Currently  Lifestyle  . Physical activity:    Days per week: Patient refused    Minutes per session: Patient refused  . Stress: Not on file  Relationships  . Social connections:    Talks on phone: Patient refused    Gets together: Patient refused    Attends religious service: Patient refused    Active member of club or organization: Patient refused    Attends meetings of clubs or organizations: Patient refused    Relationship status: Patient refused  . Intimate partner violence:    Fear of current or ex partner: Not on file    Emotionally abused: Not on file     Physically abused: Not on file    Forced sexual activity: Not  on file  Other Topics Concern  . Not on file  Social History Narrative  . Not on file       Review of Systems  All other systems reviewed and are negative.      Objective:   Physical Exam  Constitutional: He is oriented to person, place, and time. He appears well-developed and well-nourished. No distress.  Neck: No JVD present.  Cardiovascular: Normal rate and normal heart sounds. An irregularly irregular rhythm present.  Pulmonary/Chest: Effort normal. No stridor. No respiratory distress. He has no wheezes.  Abdominal: Soft. Bowel sounds are normal.  Musculoskeletal: He exhibits no edema.  Neurological: He is alert and oriented to person, place, and time. He displays normal reflexes. No cranial nerve deficit or sensory deficit. He exhibits normal  muscle tone. Coordination is abnormal. Patient is now using a walker to ambulate due to feeling unsteady on his feet Skin: He is not diaphoretic.  Vitals reviewed.  Has a slight pronator drift on the right.      Assessment & Plan:   Depression/major depressive disorder  Discontinue Prozac and begin Trintellix 10 mg a day and reassess the patient in 3 weeks.  May gradually try to transition off of Abilify given his balance issues particularly if he sees more improvement on Trintellix.  Monitor for nausea.  Monitor for closely for suicidal ideation.

## 2017-12-24 ENCOUNTER — Encounter: Payer: Self-pay | Admitting: *Deleted

## 2018-01-04 ENCOUNTER — Ambulatory Visit: Payer: Medicare Other | Admitting: Family Medicine

## 2018-01-04 ENCOUNTER — Encounter: Payer: Self-pay | Admitting: Family Medicine

## 2018-01-04 VITALS — BP 122/68 | HR 70 | Temp 98.3°F | Resp 16 | Ht 72.0 in | Wt 173.0 lb

## 2018-01-04 DIAGNOSIS — F321 Major depressive disorder, single episode, moderate: Secondary | ICD-10-CM

## 2018-01-04 MED ORDER — SERTRALINE HCL 50 MG PO TABS: 50.0000 mg | ORAL_TABLET | Freq: Every day | ORAL | 3 refills | Status: DC

## 2018-01-04 NOTE — Progress Notes (Signed)
Subjective:    Patient ID: Eugene [name], male    DOB: 09-Jul-1940, 77 y.o.   MRN: [id number]  HPI  Unfortunately, patient was recently admitted to the hospital with symptoms of a right-sided stroke.  CT scan revealed an intracranial hemorrhage in the left thalamic tract.  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 d/t HTN in setting of Xarelto and plavix use Active Problems:   Hypertension   CAD (coronary artery disease)   Atrial fibrillation (HCC)   NSTEMI (non-ST elevated myocardial infarction) ([place])   Chronic anticoagulation   Dysmetria   Benign essential HTN   Hyperlipidemia   OSA (obstructive sleep apnea)   Diabetes mellitus type 2 in nonobese ([place])   Occlusion and stenosis of basilar artery   Hypertensive emergency   Sundowning   Hx of completed stroke      HISTORY OF PRESENT ILLNESS Eugene L Stricklandis an 77 y.o.malewith history of hypertension, hyperlipidemiaandatrial fibrillation forwhich he is on Xarelto. At baseline thepatient is quite independent, drivesa car, was able to take care of all of his activities of daily living. However, per family he may not have been keeping up with his medications in terms of exact doses and schedules, with the patient confirming this. Modified Rankin:Rankin Score=0. Hewent to sleep last night around 10 PM (LKW 08/24/2017 at 2000) and woke around 930 this a.m., notingthat he had decreased sensation along hisright side along with significant dysarthria. For that reason [facility]was [facility], where aCT of hisheadrevealeda left thalamic bleed. Blood pressure was 157/98. Patient is not a good historian and it is unclear what his blood pressures usually are. CT shows a L thalamic ICH. Intracerebral  Hemorrhage (ICH) Score = 0. He was transferred to [facility] for further evaluation and treatment.    HOSPITAL COURSE Mr.Eugene L Stricklandis a 77 y.o.malewith history of afib on Xarelto, CAD/MI on plavix, HLD, HTN, OSA admitted for right sided numbness and slurry speech.   ICH: left thalamic small ICH likely due to HTN in the setting of Xarelto and plavix use s/p reversal with Kcentra  Resultant partial expressive aphasia, right pronator drift  CT head left thalamic ICH with old left cerebellar infarct with calcification  MRILeft thalamic ICH   MRASevere BA stenosis vs. Occlusion with distal constitution  CTA head and neck - stable hematoma, BA occlusion with distal recon, b/l VA stenosis L>R.   Repeat CT hematoma stable  2D EchoEF 55-60%  LDL101  HgbA1c6.4  clopidogrel 75 mg daily and Xarelto (rivaroxaban) dailyprior to admission, now on No antithromboticdue to [place]. Add ASA 81mg  7 days post ICH as neuro stable (09/01/17), and repeat CT in 2-3 weeks, if ICH resolved, add eliquis to ASA 81mg .   Patient counseled to be compliant withhisantithrombotic medications  Ongoing aggressive stroke risk factor management  Therapy recommendations: CIR  Disposition: CIRplanned, however, family decided on SNF when copays discussed and desire for longer rehab time, then they changed their mind back to CIR. Will d/c to CIR today.  Wheezing with SOB, resolved  Mild wheezing and SOB  CXR no pulmonary edema  EKG afib, no ST-T changes  duoneb PRN  Lasix 40mg  once  Leukocytosis - WBC 7.4->10.3->11.7->8.9  Afib on Xarelto  Tele showed persistent AFib  Rate controlled, no RVR  Hold off AC for now.   Will repeat CT in 2-3 weeks, if ICH resolves, start eliquis  CAD/MI  On plavix PTA  Hold off for now due to [place]  ASA 81mg  7 days post ICH if neuro stable (09/01/17)  BA occlusion  Confirmed on CTA and MRA  With distal constitution from  collaterals  Consistent with chronic occlusion  Start ASA on 09/01/17 if neuro stable  BP goal 120-140, avoid low BP  Hypertensive Emergency  BP as high as 171/118  Treated with Cleviprex in hospital  On home norvasc, lisinopril and metoprolol, resumed in hopsital  BP goal < 140 in hospital   Long term BP goal 120-140. Avoid hypotension due to BA occlusion, avoid hypertension due to anticoagulation use.  Need BP check closely at home  Hyperlipidemia  Home meds: crestor 20   LDL 101, goal < 70  Now on crestor 20  Continue statin at discharge  Diabetes type II  On metformin at home  HGBA1c 6.4  SSI in hosp  Glucoses stable, WNL not on metformin here  Resume metformin per rehab  Sundowning  May have underlying cognitive impairment  on Seroquel 25 nightly  Improved since on seroquel  Other Stroke Risk Factors  Hx stroke/TIA- imaging showed old left cerebellar infarct with calcification  Obstructive sleep apnea    DISCHARGE PLAN  Disposition:  Inpatient rehab for ongoing PT, OT and ST  Repeat CT head in 2-3 weeks, if hemorrhage resolved, add Eliquis to aspirin 81 for secondary stroke prevention  Ongoing risk factor control by Primary Care Physician at time of discharge  Follow-up Susy Frizzle, MD in 2 weeks.  Follow-up in [facility] in 4 weeks, office to schedule an appointment   09/20/17 A head CT was performed on April 26 which revealed the expected evolution of his left thalamic intracranial hemorrhage.  At that time neurology cleared the patient to resume Eliquis in addition to his aspirin.  He was discharged from inpatient rehab on April 27.  He is here today for follow-up.  Prior to today, I have not seen the patient since 2017.  At that time he had suffered a non-ST elevation myocardial infarction.  This occurred while the patient was taking aspirin in addition to Pradaxa.  At that time  cardiology recommended to continue the patient on dual antiplatelet therapy in addition to his Pradaxa for 1 month then discontinue his aspirin with the plan of continuing his Plavix and Pradaxa indefinitely.  Ultimately that is how the patient was started on Plavix in addition to antithrombotic therapy although prior to his hospitalization he had been switched to Xarelto.  Patient is here today with his son who he is living with.  He is interested in resuming driving.  I walked with the patient around our clinic today.  He is dragging his right foot slightly.  He also demonstrates some right-sided neglect as he walks.  Several times he almost tripped over boxes or other items throughout the clinic near his right leg.  He also seems to have slowed reaction time.  Given these findings, I have recommended against resuming driving at the present time.  Otherwise he is doing very well.  He is not been taking Seroquel which they were giving to him in the hospital for sundowning.  His son states that he does not feel that his father needs that now that he is back at home.  He is much less confused.  He  is not demonstrating any evidence of delirium.  Furthermore he has not been taking the Enablex.  I explained to the patient that this is an anticholinergic medication.  This increases his risk for delirium and confusion as it does cross the blood-brain barrier potentially.  He states without the medication, he experiences frequency about every 3-4 hours at night.  Patient states that he can live with that at the present time.  Unfortunately he is smoking.  He has no desire to quit at the present time.  His blood pressure today is extremely low for this patient given the complexity of his medical problems.  He denies any dizziness or lightheadedness or syncope.  He is off his metformin which he was taking prior to his hospital admission.  His hemoglobin A1c on admission was 6.4 indicating excellent control.  However he is  no longer on any hypoglycemic agent.  He is also now taking Prozac 20 mg a day for depression.  However neither the son nor the father state that they feel that he needs this medication.  He is not certain why he is taking it.  This is something that was started during the hospitalization.  At that time, my plan was: I have recommended avoiding Enablex due to polypharmacy and its anticholinergic effects.  Patient will discontinue Seroquel as he has not been taking it for more than a week and does not seem to use the medication or require it.  I have recommended decreasing Prozac to 10 mg a day and reassessing in 2 to 3 weeks.  If no significant changes seen, I would discontinue the medicine altogether.  His blood pressure today is low.  Ideally I like to keep the patient between 062 and 376 systolic over 28-31 diastolic.  Patient will buy a blood pressure monitor and check his blood pressure at home.  If consistently low, I will decrease his antihypertensive medication and then recheck his blood pressure in 2 to 3 weeks.  I explained to the patient why he needs to stay on aspirin due to his history of stroke as well as his history of non-ST elevation myocardial infarction in addition to his antithrombotic therapy and the version of Eliquis given his atrial fibrillation.  However I also explained that we need to maintain strict control of his blood pressure to avoid hypertensive urgency.  Also explained the need we need to avoid hypotension given his basilar artery stenosis.  Reassess the patient in 3 weeks  10/15/17 Unfortunately the patient was recently admitted to the hospital again.  I have copied relevant portions of his discharge summary and included them below for my reference: Admit date: 10/04/2017 Discharge date: 10/10/2017  Admitted From: Home Disposition: Home  Discharge Diagnoses:  Principal Problem:   Acute ischemic cerebrovascular accident (CVA) involving anterior cerebral artery territory  [facility])   Active Problems:   Suicidal behavior with attempted self-injury ([place])   Depression   Atrial fibrillation ([place])   Diabetes mellitus type 2, noninsulin dependent ([place])   Hyperlipidemia   Occlusion and stenosis of basilar artery   Thalamic hemorrhage (HCC)   Right hemiparesis (HCC)   Stage 2 chronic kidney disease   TIA involving basilar artery   Palliative care by specialist   Goals of care, counseling/discussion  Brief narrative/HPI Please refer to admission H&P for details, in brief,77 year old male with A. fib, hypertension, BPH, anxiety/depression, tobacco abuse, recent thalamic ICH(6 weeks back) presented to the ED with vertigo, poor balance and left-sided hearing impairment with  worsening right-sided weakness. After his recent left thalamic ICH he had mild residual right upper extremity weakness. Patient symptoms occurred about 36 hours prior to presentation and due to ongoing symptoms he became frustrated and attempted to kill himself by blowing his head off with a pistol. Patient son noticed this and intervened and brought patient to the ED. CT head and MRI brain negative for acute findings but given his clinical presentation neurology recommended this is likely acute infarction of the ipsilateral AICA causing acute unilateral hearing loss and vertigo.    Hospital course  Principal problem Acute infarction of AICA Symptoms of vertigo associated with unilateral hearing loss likely due to acute infarction of ipsilateral AICA per neurology which usually is not picked up on MRI. Had hemorrhagic ICH 6 weeks.Neurology recommends full dose aspirin until 5/28 and then transition to 81 mg aspirin and Eliquis 5 mg twice daily.  (He was on this regimen at home prior to admission).Further stroke work-up not needed. Continue statin. Has residual left-sided hearing loss.  Outpatient PT.  Depression with suicidal ideation. Patient denies suicidal ideation for past 2  days.  Also does not feel depressed today.  Telemetry psych initially recommended inpatient psych admission once medically cleared.  However given no further suicidal ideation and active depression they have cleared him to be discharged home with outpatient follow-up.  Placed on fluoxetine.  Intracranial hemorrhage of the left thalamus on 4/10. Patient was on Xarelto prior to this and was transitioned to apixaban. Holding apixaban until 5/28 and currently on full dose aspirin.  Paroxysmal A. fib Rate controlled on the monitor.   Now on full dose aspirin until 5/28, thereafter he will be on his home dose of baby aspirin and apixaban.   Essential hypertension Continue amlodipine, HCTZ and lisinopril.  Acute kidney injury Mild, possibly prerenal.  HCTZ and lisinopril were held and can be resumed upon discharge.  Coronary artery disease with history of LAD stenting in 2017 Continue aspirin and statin. Intolerant to beta-blocker due to bradycardia.  Diabetes mellitus type 2 CBG stable.  Not on medication.  A1c of 6.4.     10/15/17 Patient is here today with his son.  I directly questioned the patient about his suicide attempt.  Patient is calm and very matter-of-fact.  He states that he is lived a good life.  He does not want to be a burden to his family specifically his son.  He states that when he had this most recent stroke, he became extremely afraid that he would become a vegetable and therefore burden his family.  He was also afraid that he would then not be able to do anything to help with the situation.  Therefore, the patient decided to commit suicide.  He called the sheriff's department so that they would come and find the body, he put a gun to his head and pulled the trigger.  However his son had removed the ammunition and therefore the patient was unsuccessful.  This prompted his son to take him directly to the hospital where he was admitted.  Patient continues to  endorse the same sentiment.  He states that he is not "crazy".  However his primary concern is that his situation is only going to worsen and that he will only be a burden to his son and therefore he wants to die with dignity.  However he states that he has no plan to commit suicide because all available options have been removed from him.  His son is  controlling all of his medication, the weapons have been removed from the home, and the patient has no other plan of suicide.  He is currently on Prozac.  At that time, my plan was: More than 50 minutes were spent with the patient and his son discussing the situation.  I am concerned that the patient still wants to die despite telling the psychiatrist that he has no suicidal thoughts.  However this is not apparently due to mental illness but rather over his concern of being a burden and his right to die with dignity.  His fear is that another stroke will rob him of that right and leave him a burden.  I explained to the patient that although his situation is extremely guarded that I do believe he has hope for a good recovery given time.  We also discussed not making a rash decision now as his situation may improve with time and he may regret that decision in the future.  I then discussed the situation with the patient and his son.  We discussed returning to the hospital and having him involuntarily committed due to risk for suicide.  Both the patient and the son do not want this to happen.  They would like me to maximize therapy for depression and therefore I will add Abilify 5 mg a day and see the patient back in 1 week.  10/22/17 Patient is here today with his daughter.  His daughter states that since he started the Abilify, he seems less depressed.  He is not endorsing suicide.  He is still very negative and pessimistic about his future improvement however his spirits seem to have improved based on the family's impressions.  Patient denies suicidal thoughts or  suicidal plan.  He continues to be frustrated with his inability to hear from his left ear and the weakness in his right leg and his issues with balance.  He continues to be very negative.  He frequently states that this will never get better.  He states that I will be a burden.  He states that nothing will improve.  He wants to stop his medication.  He does not want to try physical therapy.  At that time, my plan was: Patient's depression seems to be better however he continues to demonstrate anger and negativity over his diagnosis and his condition.  I believe he is transitioning through the stages of grieving.  As I explained to the patient and his family, I believe that his anger and negativity will gradually improve with time and I believe they will also improve as he hopefully experiences some recovery from his stroke.  Therefore I have recommended physical therapy to try to improve the strength in his right leg and his balance.  Otherwise I will make no changes in his medication at this time.  I am happy to see that his suicidal thoughts have improved per the family's report.  I will recheck the patient in 4 weeks or sooner if problems arise  11/22/17 Patient is doing much better.  He is smiling today on his exam.  He states he feels better.  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  He states that he no longer feels as depressed.  He denies any suicidal ideation.  His son accompanies him today and states that his father is doing much better.  He no longer fears that his father is depressed or suicidal.  Patient states that for more than a month now, he is feeling more optimistic and seeing his body gradually recover.  His only lingering neurologic deficit is some mild hearing loss in his left ear and some mild weakness with right hip flexion.  However I walked with the patient today around the office and he is able to walk up and down the hallways with no  difficulty.  He can literally turn on a dime without losing his balance.  He has normal strength in his upper extremities.  He has no visible neurologic deficit.  At that time, my plan was: I am extremely happy with the progress the patient is made.  The suicidal ideation has completely resolved now for more than a month.  The patient is smiling and optimistic about his future.  He is requesting to be able to drive again.  Medically there is no contraindication to the patient driving.  He has full control of his arms and his legs.  He is walking without a cane and his balance is normal.  His hearing is sufficient and his vision is sufficient to allow him to drive.  From the standpoint of his depression, he appears to be in complete remission.  He denies any suicidal ideation.  His son states that he has no concern and that he believes his father is recovering well and almost back to normal.  His son would like him to be able to drive as well.  He is still living with his father as he has been ever since his stroke.  We did discuss his father living independently.  I would like to give it another month or so with continued improvement and stability prior to the son allowing his father to live independently but at the present time I am very optimistic about his future.  Reassess the patient in 2 to 3 months  12/14/17 Since I last saw the patient, there has been a deterioration in his condition.  He reports more pessimism, less energy, decreased appetite, weight loss, he has lost 14 pounds since May.  Both he and his son report depression.  His son is concerned because last night he asked him about the location of his guns.  This was a conversation he was having prior to thoughts of suicide.  Son is concerned that his depression is worsening.  Patient denies any suicidal plan.  He does emphasize that he does not want to be a burden and that he has had a long life and he is also frustrated by his lack of improvement.   When I question his lack of improvement, he reports trouble with word finding, some mild weakness in his right upper extremity where he does have some slight pronator drift as well as difficulty with balance.  He is not driving and he is still living with his family.  At that time, my plan was: Discontinue Prozac and begin Trintellix 10 mg a day and reassess the patient in 3 weeks.  May gradually try to transition off of Abilify given his balance issues particularly if he sees more improvement on Trintellix.  Monitor for nausea.  Monitor for closely for suicidal ideation.  01/04/18  Clinically the patient seems even worse.  He is here today with his daughter.  Seems more confused and more forgetful than when I last saw him.  His appetite has worsened.  He is lost another 3 pounds.  He is down 6 pounds just in the last month.  He states that he has no appetite.  He simply has no desire to eat.  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  He is no longer "piddling" in his shop and around his home.  He is not motivated.  He seems very sad and withdrawn today.  He also seems increasingly more confused.  He seems to not realize what medicine is treating what.  He certainly does not appear to be able to live independently and manage his affairs.  Denies suicidal ideation but he certainly seems pessimistic with no hope for the future.  Has very little energy Past Medical History:  Diagnosis Date  . Anxiety   . Atrial fibrillation ([place])   . BPH (benign prostatic hypertrophy)   . Cataracts, both eyes   . Coronary artery disease   . Depressed   . Hyperlipidemia   . Hypertension   . ICH (intracerebral hemorrhage) ([place]) 08/2017  . MI (myocardial infarction) ([place])   . OSA (obstructive sleep apnea)   . Pre-diabetes   . Stroke [facility])    Past Surgical History:  Procedure Laterality Date  . CARDIAC CATHETERIZATION N/A 06/26/2015   Procedure: Left Heart Cath and Coronary Angiography;  Surgeon: Burnell Blanks, MD;  Location: [place] [facility];  Service: Cardiovascular;  Laterality: N/A;  . CARDIAC CATHETERIZATION  06/26/2015   Procedure: Coronary Stent Intervention;  Surgeon: Burnell Blanks, MD;  Location: [place] [facility];  Service: Cardiovascular;;  . CARDIAC CATHETERIZATION  06/26/2015   Procedure: Coronary Balloon Angioplasty;  Surgeon: Burnell Blanks, MD;  Location: [place] [facility];  Service: Cardiovascular;;  . CORONARY STENT PLACEMENT  10/2004   Current Outpatient Medications on File Prior to Visit  Medication Sig Dispense Refill  . amLODipine (NORVASC) 10 MG tablet Take 1 tablet (10 mg total) by mouth daily. 30 tablet 0  . ARIPiprazole (ABILIFY) 5 MG tablet Take 1 tablet (5 mg total) by mouth daily. 30 tablet 5  . aspirin 81 MG EC tablet Take 1 tablet (81 mg total) by mouth daily. Take 4 tablets daily for next 2 days then 1 tablet daily 30 tablet 12  . ELIQUIS 5 MG TABS tablet TAKE 1 TABLET(5 MG) BY MOUTH TWICE DAILY 60 tablet 5  . finasteride (PROSCAR) 5 MG tablet TAKE 1 BY MOUTH DAILY 30 tablet 5  . FLUoxetine (PROZAC) 20 MG capsule Take 1 capsule (20 mg total) by mouth daily. 30 capsule 5  . hydrochlorothiazide (MICROZIDE) 12.5 MG capsule Take 1 capsule (12.5 mg total) by mouth daily. 30 capsule 5  . lisinopril (PRINIVIL,ZESTRIL) 20 MG tablet TAKE 1 TABLET(20 MG) BY MOUTH DAILY 90 tablet 3  . pantoprazole (PROTONIX) 40 MG tablet Take 1 tablet (40 mg total) by mouth daily. 30 tablet 0  . rosuvastatin (CRESTOR) 20 MG tablet Take 1 tablet (20 mg total) by mouth daily. 30 tablet 5   No current facility-administered medications on file prior to visit.    No Known Allergies Social History   Socioeconomic History  . Marital status: Divorced    Spouse name: Not on file  . Number of children: Not on file  . Years of education: Not on file  . Highest education level: Not on file  Occupational History  . Occupation: Retired Civil engineer, contracting for [employer]  . Financial resource strain: Not on file  . Food insecurity:    Worry: Not on file    Inability: Not on file  . Transportation needs:  Medical: Not on file    Non-medical: Not on file  Tobacco Use  . Smoking status: Former Smoker    Packs/day: 1.00    Years: 50.00    Pack years: 50.00    Types: Cigarettes    Last attempt to quit: 11/28/2017    Years since quitting: 0.1  . Smokeless tobacco: Never Used  Substance and Sexual Activity  . Alcohol use: No  . Drug use: No  . Sexual activity: Not Currently  Lifestyle  . Physical activity:    Days per week: Patient refused    Minutes per session: Patient refused  . Stress: Not on file  Relationships  . Social connections:    Talks on phone: Patient refused    Gets together: Patient refused    Attends religious service: Patient refused    Active member of club or organization: Patient refused    Attends meetings of clubs or organizations: Patient refused    Relationship status: Patient refused  . Intimate partner violence:    Fear of current or ex partner: Not on file    Emotionally abused: Not on file    Physically abused: Not on file    Forced sexual activity: Not on file  Other Topics Concern  . Not on file  Social History Narrative  . Not on file       Review of Systems  All other systems reviewed and are negative.      Objective:   Physical Exam  Constitutional: He is oriented to person, place, and time. He appears well-developed and well-nourished. No distress.  Neck: No JVD present.  Cardiovascular: Normal rate and normal heart sounds. An irregularly irregular rhythm present.  Pulmonary/Chest: Effort normal. No stridor. No respiratory distress. He has no wheezes.  Abdominal: Soft. Bowel sounds are normal.  Musculoskeletal: He exhibits no edema.  Neurological: He is alert and oriented to person, place, and time. He displays normal reflexes. No cranial nerve deficit or sensory deficit. He exhibits  normal  muscle tone. Coordination is abnormal. Patient is now using a walker to ambulate due to feeling unsteady on his feet Skin: He is not diaphoretic.  Vitals reviewed.  Has a slight pronator drift on the right.      Assessment & Plan:  Depression, major, single episode, moderate ([place])  Patient is unable to live independently.  He is unable to manage his medications I feel safely.  Therefore I want to consult home health and see if we can have a nurse assessment to try to help manage his medication to ensure that he is taking them properly.  I have asked the family to try to arrange a family member to come by every evening and make sure that he is taking his medication properly for the day.  I also would like him to make sure that he is at least eating 1 or 2 good meals a day.  I have recommended that he wear a first alert fall monitor that would be able to be used if he were to fall or could sense if he fell in order to send help.  I want him to discontinue Trintellix and replace with Zoloft 50 mg a day.  Otherwise will make no changes at this time.  I certainly think the patient was doing better on Prozac that he was on Trintellix and I am hoping that Zoloft will help him sleep better and may improve his anxiety management compared to the Trintellix

## 2018-02-01 ENCOUNTER — Encounter: Payer: Self-pay | Admitting: Family Medicine

## 2018-02-01 ENCOUNTER — Ambulatory Visit: Payer: Medicare Other | Admitting: Family Medicine

## 2018-02-01 VITALS — BP 130/76 | HR 66 | Temp 98.4°F | Resp 16 | Ht 72.0 in | Wt 168.0 lb

## 2018-02-01 DIAGNOSIS — F321 Major depressive disorder, single episode, moderate: Secondary | ICD-10-CM

## 2018-02-01 DIAGNOSIS — I6930 Unspecified sequelae of cerebral infarction: Secondary | ICD-10-CM | POA: Diagnosis not present

## 2018-02-01 DIAGNOSIS — Z23 Encounter for immunization: Secondary | ICD-10-CM

## 2018-02-01 LAB — LIPID PANEL
Cholesterol: 105 mg/dL (ref <200)
HDL: 56 mg/dL (ref >40)
LDL CHOLESTEROL (CALC): 27 mg/dL
NON-HDL CHOLESTEROL (CALC): 49 mg/dL (ref <130)
Total CHOL/HDL Ratio: 1.9 (calc) (ref <5.0)
Triglycerides: 135 mg/dL (ref <150)

## 2018-02-01 LAB — CBC WITH DIFFERENTIAL/PLATELET
BASOS PCT: 0.2 %
Basophils Absolute: 20 cells/uL (ref 0–200)
EOS ABS: 20 {cells}/uL (ref 15–500)
Eosinophils Relative: 0.2 %
HEMATOCRIT: 45.7 % (ref 38.5–50.0)
HEMOGLOBIN: 15.6 g/dL (ref 13.2–17.1)
LYMPHS ABS: 1519 {cells}/uL (ref 850–3900)
MCH: 30 pg (ref 27.0–33.0)
MCHC: 34.1 g/dL (ref 32.0–36.0)
MCV: 87.9 fL (ref 80.0–100.0)
MPV: 10 fL (ref 7.5–12.5)
Monocytes Relative: 8.6 %
Neutro Abs: 7399 cells/uL (ref 1500–7800)
Neutrophils Relative %: 75.5 %
PLATELETS: 213 10*3/uL (ref 140–400)
RBC: 5.2 10*6/uL (ref 4.20–5.80)
RDW: 13.2 % (ref 11.0–15.0)
TOTAL LYMPHOCYTE: 15.5 %
WBC: 9.8 10*3/uL (ref 3.8–10.8)
WBCMIX: 843 {cells}/uL (ref 200–950)

## 2018-02-01 LAB — COMPLETE METABOLIC PANEL WITH GFR
AG Ratio: 1.8 (calc) (ref 1.0–2.5)
ALBUMIN MSPROF: 4.1 g/dL (ref 3.6–5.1)
ALT: 16 U/L (ref 9–46)
AST: 12 U/L (ref 10–35)
Alkaline phosphatase (APISO): 69 U/L (ref 40–115)
BUN / CREAT RATIO: 20 (calc) (ref 6–22)
BUN: 24 mg/dL (ref 7–25)
CALCIUM: 9.4 mg/dL (ref 8.6–10.3)
CO2: 26 mmol/L (ref 20–32)
CREATININE: 1.21 mg/dL — AB (ref 0.70–1.18)
Chloride: 106 mmol/L (ref 98–110)
GFR, EST NON AFRICAN AMERICAN: 57 mL/min/{1.73_m2} — AB (ref >=60)
GFR, Est African American: 66 mL/min/{1.73_m2} (ref >=60)
GLOBULIN: 2.3 g/dL (ref 1.9–3.7)
Glucose, Bld: 99 mg/dL (ref 65–99)
Potassium: 4 mmol/L (ref 3.5–5.3)
SODIUM: 143 mmol/L (ref 135–146)
Total Bilirubin: 0.5 mg/dL (ref 0.2–1.2)
Total Protein: 6.4 g/dL (ref 6.1–8.1)

## 2018-02-01 NOTE — Addendum Note (Signed)
Addended by: Shary Decamp B on: 02/01/2018 03:27 PM   Modules accepted: Orders

## 2018-02-01 NOTE — Progress Notes (Signed)
Subjective:    Patient ID: Eugene [name], male    DOB: 1940-08-20, 77 y.o.   MRN: [id number]  HPI  Unfortunately, patient was recently admitted to the hospital with symptoms of a right-sided stroke.  CT scan revealed an intracranial hemorrhage in the left thalamic tract.  I have copied relevant portions of the discharge summary and included them below for my reference:  Date of Admission: 08/25/2017 Date of Discharge: 08/31/2017  DISCHARGE DIAGNOSIS:  Principal Problem:   ICH (intracerebral hemorrhage) ([place]) - L thalamic d/t HTN in setting of Xarelto and plavix use Active Problems:   Hypertension   CAD (coronary artery disease)   Atrial fibrillation (HCC)   NSTEMI (non-ST elevated myocardial infarction) ([place])   Chronic anticoagulation   Dysmetria   Benign essential HTN   Hyperlipidemia   OSA (obstructive sleep apnea)   Diabetes mellitus type 2 in nonobese ([place])   Occlusion and stenosis of basilar artery   Hypertensive emergency   Sundowning   Hx of completed stroke      HISTORY OF PRESENT ILLNESS Eugene L Stricklandis an 77 y.o.malewith history of hypertension, hyperlipidemiaandatrial fibrillation forwhich he is on Xarelto. At baseline thepatient is quite independent, drivesa car, was able to take care of all of his activities of daily living. However, per family he may not have been keeping up with his medications in terms of exact doses and schedules, with the patient confirming this. Modified Rankin:Rankin Score=0. Hewent to sleep last night around 10 PM (LKW 08/24/2017 at 2000) and woke around 930 this a.m., notingthat he had decreased sensation along hisright side along with significant dysarthria. For that reason [facility]was [facility], where aCT of hisheadrevealeda left thalamic bleed. Blood pressure was 157/98. Patient is not a good historian and it is unclear what his blood pressures usually are. CT shows a L thalamic ICH. Intracerebral  Hemorrhage (ICH) Score = 0. He was transferred to [facility] for further evaluation and treatment.    HOSPITAL COURSE Eugene L Stricklandis a 77 y.o.malewith history of afib on Xarelto, CAD/MI on plavix, HLD, HTN, OSA admitted for right sided numbness and slurry speech.   ICH: left thalamic small ICH likely due to HTN in the setting of Xarelto and plavix use s/p reversal with Kcentra  Resultant partial expressive aphasia, right pronator drift  CT head left thalamic ICH with old left cerebellar infarct with calcification  MRILeft thalamic ICH   MRASevere BA stenosis vs. Occlusion with distal constitution  CTA head and neck - stable hematoma, BA occlusion with distal recon, b/l VA stenosis L>R.   Repeat CT hematoma stable  2D EchoEF 55-60%  LDL101  HgbA1c6.4  clopidogrel 75 mg daily and Xarelto (rivaroxaban) dailyprior to admission, now on No antithromboticdue to Elias-Fela Solis. Add ASA 81mg  7 days post ICH as neuro stable (09/01/17), and repeat CT in 2-3 weeks, if ICH resolved, add eliquis to ASA 81mg .   Patient counseled to be compliant withhisantithrombotic medications  Ongoing aggressive stroke risk factor management  Therapy recommendations: CIR  Disposition: CIRplanned, however, family decided on SNF when copays discussed and desire for longer rehab time, then they changed their mind back to CIR. Will d/c to CIR today.  Wheezing with SOB, resolved  Mild wheezing and SOB  CXR no pulmonary edema  EKG afib, no ST-T changes  duoneb PRN  Lasix 40mg  once  Leukocytosis - WBC 7.4->10.3->11.7->8.9  Afib on Xarelto  Tele showed persistent AFib  Rate controlled, no RVR  Hold off AC for now.   Will repeat CT in 2-3 weeks, if ICH resolves, start eliquis  CAD/MI  On plavix PTA  Hold off for now due to [place]  ASA 81mg  7 days post ICH if neuro stable (09/01/17)  BA occlusion  Confirmed on CTA and MRA  With distal constitution from  collaterals  Consistent with chronic occlusion  Start ASA on 09/01/17 if neuro stable  BP goal 120-140, avoid low BP  Hypertensive Emergency  BP as high as 171/118  Treated with Cleviprex in hospital  On home norvasc, lisinopril and metoprolol, resumed in hopsital  BP goal < 140 in hospital   Long term BP goal 120-140. Avoid hypotension due to BA occlusion, avoid hypertension due to anticoagulation use.  Need BP check closely at home  Hyperlipidemia  Home meds: crestor 20   LDL 101, goal < 70  Now on crestor 20  Continue statin at discharge  Diabetes type II  On metformin at home  HGBA1c 6.4  SSI in hosp  Glucoses stable, WNL not on metformin here  Resume metformin per rehab  Sundowning  May have underlying cognitive impairment  on Seroquel 25 nightly  Improved since on seroquel  Other Stroke Risk Factors  Hx stroke/TIA- imaging showed old left cerebellar infarct with calcification  Obstructive sleep apnea    DISCHARGE PLAN  Disposition:  Inpatient rehab for ongoing PT, OT and ST  Repeat CT head in 2-3 weeks, if hemorrhage resolved, add Eliquis to aspirin 81 for secondary stroke prevention  Ongoing risk factor control by Primary Care Physician at time of discharge  Follow-up Susy Frizzle, MD in 2 weeks.  Follow-up in [facility] in 4 weeks, office to schedule an appointment   09/20/17 A head CT was performed on April 26 which revealed the expected evolution of his left thalamic intracranial hemorrhage.  At that time neurology cleared the patient to resume Eliquis in addition to his aspirin.  He was discharged from inpatient rehab on April 27.  He is here today for follow-up.  Prior to today, I have not seen the patient since 2017.  At that time he had suffered a non-ST elevation myocardial infarction.  This occurred while the patient was taking aspirin in addition to Pradaxa.  At that time  cardiology recommended to continue the patient on dual antiplatelet therapy in addition to his Pradaxa for 1 month then discontinue his aspirin with the plan of continuing his Plavix and Pradaxa indefinitely.  Ultimately that is how the patient was started on Plavix in addition to antithrombotic therapy although prior to his hospitalization he had been switched to Xarelto.  Patient is here today with his son who he is living with.  He is interested in resuming driving.  I walked with the patient around our clinic today.  He is dragging his right foot slightly.  He also demonstrates some right-sided neglect as he walks.  Several times he almost tripped over boxes or other items throughout the clinic near his right leg.  He also seems to have slowed reaction time.  Given these findings, I have recommended against resuming driving at the present time.  Otherwise he is doing very well.  He is not been taking Seroquel which they were giving to him in the hospital for sundowning.  His son states that he does not feel that his father needs that now that he is back at home.  He is much less confused.  He  is not demonstrating any evidence of delirium.  Furthermore he has not been taking the Enablex.  I explained to the patient that this is an anticholinergic medication.  This increases his risk for delirium and confusion as it does cross the blood-brain barrier potentially.  He states without the medication, he experiences frequency about every 3-4 hours at night.  Patient states that he can live with that at the present time.  Unfortunately he is smoking.  He has no desire to quit at the present time.  His blood pressure today is extremely low for this patient given the complexity of his medical problems.  He denies any dizziness or lightheadedness or syncope.  He is off his metformin which he was taking prior to his hospital admission.  His hemoglobin A1c on admission was 6.4 indicating excellent control.  However he is  no longer on any hypoglycemic agent.  He is also now taking Prozac 20 mg a day for depression.  However neither the son nor the father state that they feel that he needs this medication.  He is not certain why he is taking it.  This is something that was started during the hospitalization.  At that time, my plan was: I have recommended avoiding Enablex due to polypharmacy and its anticholinergic effects.  Patient will discontinue Seroquel as he has not been taking it for more than a week and does not seem to use the medication or require it.  I have recommended decreasing Prozac to 10 mg a day and reassessing in 2 to 3 weeks.  If no significant changes seen, I would discontinue the medicine altogether.  His blood pressure today is low.  Ideally I like to keep the patient between 062 and 376 systolic over 28-31 diastolic.  Patient will buy a blood pressure monitor and check his blood pressure at home.  If consistently low, I will decrease his antihypertensive medication and then recheck his blood pressure in 2 to 3 weeks.  I explained to the patient why he needs to stay on aspirin due to his history of stroke as well as his history of non-ST elevation myocardial infarction in addition to his antithrombotic therapy and the version of Eliquis given his atrial fibrillation.  However I also explained that we need to maintain strict control of his blood pressure to avoid hypertensive urgency.  Also explained the need we need to avoid hypotension given his basilar artery stenosis.  Reassess the patient in 3 weeks  10/15/17 Unfortunately the patient was recently admitted to the hospital again.  I have copied relevant portions of his discharge summary and included them below for my reference: Admit date: 10/04/2017 Discharge date: 10/10/2017  Admitted From: Home Disposition: Home  Discharge Diagnoses:  Principal Problem:   Acute ischemic cerebrovascular accident (CVA) involving anterior cerebral artery territory  [facility])   Active Problems:   Suicidal behavior with attempted self-injury ([place])   Depression   Atrial fibrillation ([place])   Diabetes mellitus type 2, noninsulin dependent ([place])   Hyperlipidemia   Occlusion and stenosis of basilar artery   Thalamic hemorrhage (HCC)   Right hemiparesis (HCC)   Stage 2 chronic kidney disease   TIA involving basilar artery   Palliative care by specialist   Goals of care, counseling/discussion  Brief narrative/HPI Please refer to admission H&P for details, in brief,77 year old male with A. fib, hypertension, BPH, anxiety/depression, tobacco abuse, recent thalamic ICH(6 weeks back) presented to the ED with vertigo, poor balance and left-sided hearing impairment with  worsening right-sided weakness. After his recent left thalamic ICH he had mild residual right upper extremity weakness. Patient symptoms occurred about 36 hours prior to presentation and due to ongoing symptoms he became frustrated and attempted to kill himself by blowing his head off with a pistol. Patient son noticed this and intervened and brought patient to the ED. CT head and MRI brain negative for acute findings but given his clinical presentation neurology recommended this is likely acute infarction of the ipsilateral AICA causing acute unilateral hearing loss and vertigo.    Hospital course  Principal problem Acute infarction of AICA Symptoms of vertigo associated with unilateral hearing loss likely due to acute infarction of ipsilateral AICA per neurology which usually is not picked up on MRI. Had hemorrhagic ICH 6 weeks.Neurology recommends full dose aspirin until 5/28 and then transition to 81 mg aspirin and Eliquis 5 mg twice daily.  (He was on this regimen at home prior to admission).Further stroke work-up not needed. Continue statin. Has residual left-sided hearing loss.  Outpatient PT.  Depression with suicidal ideation. Patient denies suicidal ideation for past 2  days.  Also does not feel depressed today.  Telemetry psych initially recommended inpatient psych admission once medically cleared.  However given no further suicidal ideation and active depression they have cleared him to be discharged home with outpatient follow-up.  Placed on fluoxetine.  Intracranial hemorrhage of the left thalamus on 4/10. Patient was on Xarelto prior to this and was transitioned to apixaban. Holding apixaban until 5/28 and currently on full dose aspirin.  Paroxysmal A. fib Rate controlled on the monitor.   Now on full dose aspirin until 5/28, thereafter he will be on his home dose of baby aspirin and apixaban.   Essential hypertension Continue amlodipine, HCTZ and lisinopril.  Acute kidney injury Mild, possibly prerenal.  HCTZ and lisinopril were held and can be resumed upon discharge.  Coronary artery disease with history of LAD stenting in 2017 Continue aspirin and statin. Intolerant to beta-blocker due to bradycardia.  Diabetes mellitus type 2 CBG stable.  Not on medication.  A1c of 6.4.     10/15/17 Patient is here today with his son.  I directly questioned the patient about his suicide attempt.  Patient is calm and very matter-of-fact.  He states that he is lived a good life.  He does not want to be a burden to his family specifically his son.  He states that when he had this most recent stroke, he became extremely afraid that he would become a vegetable and therefore burden his family.  He was also afraid that he would then not be able to do anything to help with the situation.  Therefore, the patient decided to commit suicide.  He called the sheriff's department so that they would come and find the body, he put a gun to his head and pulled the trigger.  However his son had removed the ammunition and therefore the patient was unsuccessful.  This prompted his son to take him directly to the hospital where he was admitted.  Patient continues to  endorse the same sentiment.  He states that he is not "crazy".  However his primary concern is that his situation is only going to worsen and that he will only be a burden to his son and therefore he wants to die with dignity.  However he states that he has no plan to commit suicide because all available options have been removed from him.  His son is  controlling all of his medication, the weapons have been removed from the home, and the patient has no other plan of suicide.  He is currently on Prozac.  At that time, my plan was: More than 50 minutes were spent with the patient and his son discussing the situation.  I am concerned that the patient still wants to die despite telling the psychiatrist that he has no suicidal thoughts.  However this is not apparently due to mental illness but rather over his concern of being a burden and his right to die with dignity.  His fear is that another stroke will rob him of that right and leave him a burden.  I explained to the patient that although his situation is extremely guarded that I do believe he has hope for a good recovery given time.  We also discussed not making a rash decision now as his situation may improve with time and he may regret that decision in the future.  I then discussed the situation with the patient and his son.  We discussed returning to the hospital and having him involuntarily committed due to risk for suicide.  Both the patient and the son do not want this to happen.  They would like me to maximize therapy for depression and therefore I will add Abilify 5 mg a day and see the patient back in 1 week.  10/22/17 Patient is here today with his daughter.  His daughter states that since he started the Abilify, he seems less depressed.  He is not endorsing suicide.  He is still very negative and pessimistic about his future improvement however his spirits seem to have improved based on the family's impressions.  Patient denies suicidal thoughts or  suicidal plan.  He continues to be frustrated with his inability to hear from his left ear and the weakness in his right leg and his issues with balance.  He continues to be very negative.  He frequently states that this will never get better.  He states that I will be a burden.  He states that nothing will improve.  He wants to stop his medication.  He does not want to try physical therapy.  At that time, my plan was: Patient's depression seems to be better however he continues to demonstrate anger and negativity over his diagnosis and his condition.  I believe he is transitioning through the stages of grieving.  As I explained to the patient and his family, I believe that his anger and negativity will gradually improve with time and I believe they will also improve as he hopefully experiences some recovery from his stroke.  Therefore I have recommended physical therapy to try to improve the strength in his right leg and his balance.  Otherwise I will make no changes in his medication at this time.  I am happy to see that his suicidal thoughts have improved per the family's report.  I will recheck the patient in 4 weeks or sooner if problems arise  11/22/17 Patient is doing much better.  He is smiling today on his exam.  He states he feels better.  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  He states that he no longer feels as depressed.  He denies any suicidal ideation.  His son accompanies him today and states that his father is doing much better.  He no longer fears that his father is depressed or suicidal.  Patient states that for more than a month now, he is feeling more optimistic and seeing his body gradually recover.  His only lingering neurologic deficit is some mild hearing loss in his left ear and some mild weakness with right hip flexion.  However I walked with the patient today around the office and he is able to walk up and down the hallways with no  difficulty.  He can literally turn on a dime without losing his balance.  He has normal strength in his upper extremities.  He has no visible neurologic deficit.  At that time, my plan was: I am extremely happy with the progress the patient is made.  The suicidal ideation has completely resolved now for more than a month.  The patient is smiling and optimistic about his future.  He is requesting to be able to drive again.  Medically there is no contraindication to the patient driving.  He has full control of his arms and his legs.  He is walking without a cane and his balance is normal.  His hearing is sufficient and his vision is sufficient to allow him to drive.  From the standpoint of his depression, he appears to be in complete remission.  He denies any suicidal ideation.  His son states that he has no concern and that he believes his father is recovering well and almost back to normal.  His son would like him to be able to drive as well.  He is still living with his father as he has been ever since his stroke.  We did discuss his father living independently.  I would like to give it another month or so with continued improvement and stability prior to the son allowing his father to live independently but at the present time I am very optimistic about his future.  Reassess the patient in 2 to 3 months  12/14/17 Since I last saw the patient, there has been a deterioration in his condition.  He reports more pessimism, less energy, decreased appetite, weight loss, he has lost 14 pounds since May.  Both he and his son report depression.  His son is concerned because last night he asked him about the location of his guns.  This was a conversation he was having prior to thoughts of suicide.  Son is concerned that his depression is worsening.  Patient denies any suicidal plan.  He does emphasize that he does not want to be a burden and that he has had a long life and he is also frustrated by his lack of improvement.   When I question his lack of improvement, he reports trouble with word finding, some mild weakness in his right upper extremity where he does have some slight pronator drift as well as difficulty with balance.  He is not driving and he is still living with his family.  At that time, my plan was: Discontinue Prozac and begin Trintellix 10 mg a day and reassess the patient in 3 weeks.  May gradually try to transition off of Abilify given his balance issues particularly if he sees more improvement on Trintellix.  Monitor for nausea.  Monitor for closely for suicidal ideation.  01/04/18  Clinically the patient seems even worse.  He is here today with his daughter.  Seems more confused and more forgetful than when I last saw him.  His appetite has worsened.  He is lost another 3 pounds.  He is down 6 pounds just in the last month.  He states that he has no appetite.  He simply has no desire to eat.  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  He is no longer "piddling" in his shop and around his home.  He is not motivated.  He seems very sad and withdrawn today.  He also seems increasingly more confused.  He seems to not realize what medicine is treating what.  He certainly does not appear to be able to live independently and manage his affairs.  Denies suicidal ideation but he certainly seems pessimistic with no hope for the future.  Has very little energy.  AT that time, my plan was: Patient is unable to live independently.  He is unable to manage his medications I feel safely.  Therefore I want to consult home health and see if we can have a nurse assessment to try to help manage his medication to ensure that he is taking them properly.  I have asked the family to try to arrange a family member to come by every evening and make sure that he is taking his medication properly for the day.  I also would like him to make sure that he is at least eating 1 or 2 good meals a day.  I have recommended that he wear a first  alert fall monitor that would be able to be used if he were to fall or could sense if he fell in order to send help.  I want him to discontinue Trintellix and replace with Zoloft 50 mg a day.  Otherwise will make no changes at this time.  I certainly think the patient was doing better on Prozac that he was on Trintellix and I am hoping that Zoloft will help him sleep better and may improve his anxiety management compared to the Trintellix  02/01/18 Patient is here today with his daughter.  He thinks the depression is doing better.  He is sleeping approximately 6 hours every night.  He denies any further suicidal ideation.  Unfortunately he has lost 5 more pounds.  He is afraid to use the stove because he is afraid that he will forget to turn it off.  He is demonstrating short-term memory problems.  I performed a Mini-Mental status exam today.  The patient is able to correctly tell me the date time and location.  However he can only remember 1 out of 3 objects or recall.  He is unable to perform serial sevens at all.  He is able to get 2 letters correct on spelling world in reverse.  At best he scores 25 out of 30.  His family has already arranged power of attorney and is managing his finances.  However he does not feel safe driving despite his reaction time and physical strength being adequate to do so.  His family is still living with him to supervise his medication, to supervise him eating however they are no longer concerned about depression or suicidal thoughts.  It appears to be more an issue with vascular dementia and memory problems that are keeping the patient from being independent.  He also feels very unsteady on his feet.  He is using a cane and a walker, but he feels his balance is weak and compromised and he feels like he is at high risk for falling. Wt Readings from Last 3 Encounters:  02/01/18 168 lb (76.2 kg)  01/04/18 173 lb (78.5 kg)  12/14/17 176 lb (79.8 kg)    Past Medical History:   Diagnosis  Date  . Anxiety   . Atrial fibrillation ([place])   . BPH (benign prostatic hypertrophy)   . Cataracts, both eyes   . Coronary artery disease   . Depressed   . Hyperlipidemia   . Hypertension   . ICH (intracerebral hemorrhage) ([place]) 08/2017  . MI (myocardial infarction) ([place])   . OSA (obstructive sleep apnea)   . Pre-diabetes   . Stroke [facility])    Past Surgical History:  Procedure Laterality Date  . CARDIAC CATHETERIZATION N/A 06/26/2015   Procedure: Left Heart Cath and Coronary Angiography;  Surgeon: Burnell Blanks, MD;  Location: [place] [facility];  Service: Cardiovascular;  Laterality: N/A;  . CARDIAC CATHETERIZATION  06/26/2015   Procedure: Coronary Stent Intervention;  Surgeon: Burnell Blanks, MD;  Location: [place] [facility];  Service: Cardiovascular;;  . CARDIAC CATHETERIZATION  06/26/2015   Procedure: Coronary Balloon Angioplasty;  Surgeon: Burnell Blanks, MD;  Location: [facility];  Service: Cardiovascular;;  . CORONARY STENT PLACEMENT  10/2004   Current Outpatient Medications on File Prior to Visit  Medication Sig Dispense Refill  . amLODipine (NORVASC) 10 MG tablet Take 1 tablet (10 mg total) by mouth daily. 30 tablet 0  . ARIPiprazole (ABILIFY) 5 MG tablet Take 1 tablet (5 mg total) by mouth daily. 30 tablet 5  . aspirin 81 MG EC tablet Take 1 tablet (81 mg total) by mouth daily. Take 4 tablets daily for next 2 days then 1 tablet daily 30 tablet 12  . ELIQUIS 5 MG TABS tablet TAKE 1 TABLET(5 MG) BY MOUTH TWICE DAILY 60 tablet 5  . hydrochlorothiazide (MICROZIDE) 12.5 MG capsule Take 1 capsule (12.5 mg total) by mouth daily. 30 capsule 5  . lisinopril (PRINIVIL,ZESTRIL) 20 MG tablet TAKE 1 TABLET(20 MG) BY MOUTH DAILY 90 tablet 3  . pantoprazole (PROTONIX) 40 MG tablet Take 1 tablet (40 mg total) by mouth daily. 30 tablet 0  . rosuvastatin (CRESTOR) 20 MG tablet Take 1 tablet (20 mg total) by mouth daily. 30 tablet 5  . sertraline  (ZOLOFT) 50 MG tablet Take 1 tablet (50 mg total) by mouth daily. 30 tablet 3   No current facility-administered medications on file prior to visit.    No Known Allergies Social History   Socioeconomic History  . Marital status: Divorced    Spouse name: Not on file  . Number of children: Not on file  . Years of education: Not on file  . Highest education level: Not on file  Occupational History  . Occupation: Retired Civil engineer, contracting for [employer]  . Financial resource strain: Not on file  . Food insecurity:    Worry: Not on file    Inability: Not on file  . Transportation needs:    Medical: Not on file    Non-medical: Not on file  Tobacco Use  . Smoking status: Former Smoker    Packs/day: 1.00    Years: 50.00    Pack years: 50.00    Types: Cigarettes    Last attempt to quit: 11/28/2017    Years since quitting: 0.1  . Smokeless tobacco: Never Used  Substance and Sexual Activity  . Alcohol use: No  . Drug use: No  . Sexual activity: Not Currently  Lifestyle  . Physical activity:    Days per week: Patient refused    Minutes per session: Patient refused  . Stress: Not on file  Relationships  . Social connections:    Talks on  phone: Patient refused    Gets together: Patient refused    Attends religious service: Patient refused    Active member of club or organization: Patient refused    Attends meetings of clubs or organizations: Patient refused    Relationship status: Patient refused  . Intimate partner violence:    Fear of current or ex partner: Not on file    Emotionally abused: Not on file    Physically abused: Not on file    Forced sexual activity: Not on file  Other Topics Concern  . Not on file  Social History Narrative  . Not on file       Review of Systems  All other systems reviewed and are negative.      Objective:   Physical Exam  Constitutional: He is oriented to person, place, and time. He appears well-developed and  well-nourished. No distress.  Neck: No JVD present.  Cardiovascular: Normal rate and normal heart sounds. An irregularly irregular rhythm present.  Pulmonary/Chest: Effort normal. No stridor. No respiratory distress. He has no wheezes.  Abdominal: Soft. Bowel sounds are normal.  Musculoskeletal: He exhibits no edema.  Neurological: He is alert and oriented to person, place, and time. He displays normal reflexes. No cranial nerve deficit or sensory deficit. He exhibits normal  muscle tone. Coordination is abnormal. Patient is now using a walker to ambulate due to feeling unsteady on his feet Skin: He is not diaphoretic.  Vitals reviewed.  Has a slight pronator drift on the right.      Assessment & Plan:  History of CVA with residual deficit - Plan: COMPLETE METABOLIC PANEL WITH GFR, CBC with Differential/Platelet, Lipid panel  Depression, major, single episode, moderate (Gregory) I believe the depression is better.  I will make no changes in his dose of Zoloft.  However the patient does not appear to be able to live independently.  He is not driving.  His family is managing his finances.  I will arrange home health physical therapy to help work with the patient regarding balance.  I will also contact [employer] to see if we can arrange meal service for the patient to ensure that he is getting adequate nutrition.  I will also consult home health to have a nurse come by and help supervise his medication or at least arrange a pillbox.

## 2018-02-04 ENCOUNTER — Telehealth: Payer: Self-pay

## 2018-02-04 NOTE — Telephone Encounter (Signed)
Eugene [name] from [facility] called and wanted PCP to know no skilled nursing starter care  has been started only Physical Therapy.They will start Start of Care along with physical therapy next week.

## 2018-02-05 ENCOUNTER — Other Ambulatory Visit: Payer: Self-pay | Admitting: Family Medicine

## 2018-02-07 DIAGNOSIS — I4811 Longstanding persistent atrial fibrillation: Secondary | ICD-10-CM | POA: Diagnosis not present

## 2018-02-07 DIAGNOSIS — E119 Type 2 diabetes mellitus without complications: Secondary | ICD-10-CM | POA: Diagnosis not present

## 2018-02-07 DIAGNOSIS — I651 Occlusion and stenosis of basilar artery: Secondary | ICD-10-CM | POA: Diagnosis not present

## 2018-02-07 DIAGNOSIS — N182 Chronic kidney disease, stage 2 (mild): Secondary | ICD-10-CM | POA: Diagnosis not present

## 2018-02-07 DIAGNOSIS — I129 Hypertensive chronic kidney disease with stage 1 through stage 4 chronic kidney disease, or unspecified chronic kidney disease: Secondary | ICD-10-CM | POA: Diagnosis not present

## 2018-02-07 DIAGNOSIS — E785 Hyperlipidemia, unspecified: Secondary | ICD-10-CM | POA: Diagnosis not present

## 2018-02-07 DIAGNOSIS — F321 Major depressive disorder, single episode, moderate: Secondary | ICD-10-CM | POA: Diagnosis not present

## 2018-02-07 DIAGNOSIS — G4730 Sleep apnea, unspecified: Secondary | ICD-10-CM | POA: Diagnosis not present

## 2018-02-07 DIAGNOSIS — R410 Disorientation, unspecified: Secondary | ICD-10-CM | POA: Diagnosis not present

## 2018-02-07 DIAGNOSIS — I69351 Hemiplegia and hemiparesis following cerebral infarction affecting right dominant side: Secondary | ICD-10-CM | POA: Diagnosis not present

## 2018-02-07 DIAGNOSIS — I252 Old myocardial infarction: Secondary | ICD-10-CM | POA: Diagnosis not present

## 2018-02-07 DIAGNOSIS — R42 Dizziness and giddiness: Secondary | ICD-10-CM | POA: Diagnosis not present

## 2018-02-07 DIAGNOSIS — I2510 Atherosclerotic heart disease of native coronary artery without angina pectoris: Secondary | ICD-10-CM | POA: Diagnosis not present

## 2018-02-08 ENCOUNTER — Telehealth: Payer: Self-pay | Admitting: Family Medicine

## 2018-02-08 NOTE — Telephone Encounter (Signed)
Home health Nurse Amy called stating that patient blood pressure has been running low. The highest they were able to get BP was 98/62. Additional readings were as follows: 86/60 sitting, and 84/56 standing. Please advise?

## 2018-02-09 NOTE — Telephone Encounter (Signed)
Called home numbers and no answer and no vm. Call son and lmovm to stop medication. Called Amy at [facility] [phone]) and informed her of the need to stop the Amlodipine. She will get in contact with them and recheck BP next week. Amy called back and states that his BP today was 122/74. Informed her to stop the Amlodipine anyway and recheck BP next week. She will relay that to his daughter that is with him right now.

## 2018-02-09 NOTE — Telephone Encounter (Signed)
Those are way too low, I would stop amlodipine and recheck bp in 1 week.

## 2018-02-25 ENCOUNTER — Other Ambulatory Visit: Payer: Self-pay

## 2018-02-25 ENCOUNTER — Emergency Department (HOSPITAL_COMMUNITY)
Admission: EM | Admit: 2018-02-25 | Discharge: 2018-02-25 | Disposition: A | Discharge status: Home / Self Care | Payer: Medicare Other | Attending: Emergency Medicine | Admitting: Emergency Medicine

## 2018-02-25 ENCOUNTER — Encounter (HOSPITAL_COMMUNITY): Payer: Self-pay | Admitting: Emergency Medicine

## 2018-02-25 DIAGNOSIS — R030 Elevated blood-pressure reading, without diagnosis of hypertension: Secondary | ICD-10-CM | POA: Insufficient documentation

## 2018-02-25 DIAGNOSIS — Z5321 Procedure and treatment not carried out due to patient leaving prior to being seen by health care provider: Secondary | ICD-10-CM | POA: Diagnosis not present

## 2018-02-25 NOTE — ED Notes (Signed)
Pt reported to registration (Delilah) that his pressure was down and they were going home

## 2018-02-25 NOTE — ED Triage Notes (Signed)
Patient states his blood pressure was 165/94 at home. Patient denies any symptoms. States "I actually feel pretty good."

## 2018-03-01 ENCOUNTER — Telehealth: Payer: Self-pay | Admitting: Family Medicine

## 2018-03-01 NOTE — Telephone Encounter (Signed)
Pt's son called and states that pt's bp has been elevated - 145-174 on top and wanted to know if they needed to restart his amlodipine?  Per Dr. Dennard Schaumann - yes restart amlodipine but only 5 mg (cut amlodipine 10mg  - 1/2 tab po qd)   Pt's son aware of recommendations.

## 2018-04-15 ENCOUNTER — Other Ambulatory Visit: Payer: Self-pay | Admitting: Family Medicine

## 2018-04-19 ENCOUNTER — Other Ambulatory Visit: Payer: Self-pay | Admitting: Family Medicine

## 2018-04-19 MED ORDER — FINASTERIDE 5 MG PO TABS: ORAL_TABLET | ORAL | 1 refills | Status: DC

## 2018-05-03 ENCOUNTER — Other Ambulatory Visit: Payer: Self-pay | Admitting: Family Medicine

## 2018-05-09 ENCOUNTER — Other Ambulatory Visit: Payer: Self-pay | Admitting: Family Medicine

## 2018-05-16 ENCOUNTER — Telehealth: Payer: Self-pay | Admitting: Family Medicine

## 2018-05-16 NOTE — Telephone Encounter (Signed)
Pt's son called and states that the pt is talking about suicide again and wanting his guns back so he can finish the job. He does not have any guns in his house at this time as son removed the past time he had these thoughts. His son does not think he is in addiment danger at this time. I suggested that he contact a geriatric psychiatrists and make an apt to see him or if he gets worse and he thinks he may harm himself to take him to [facility] for evaluation. Pts son verbalizes understanding and if he needs any further help to please call us back.

## 2018-05-18 ENCOUNTER — Other Ambulatory Visit: Payer: Self-pay | Admitting: Family Medicine

## 2018-05-31 DIAGNOSIS — I69351 Hemiplegia and hemiparesis following cerebral infarction affecting right dominant side: Secondary | ICD-10-CM | POA: Diagnosis not present

## 2018-05-31 DIAGNOSIS — Z79899 Other long term (current) drug therapy: Secondary | ICD-10-CM | POA: Diagnosis not present

## 2018-05-31 DIAGNOSIS — E119 Type 2 diabetes mellitus without complications: Secondary | ICD-10-CM | POA: Diagnosis not present

## 2018-05-31 DIAGNOSIS — I4891 Unspecified atrial fibrillation: Secondary | ICD-10-CM | POA: Diagnosis not present

## 2018-05-31 DIAGNOSIS — M25551 Pain in right hip: Secondary | ICD-10-CM | POA: Diagnosis not present

## 2018-05-31 DIAGNOSIS — Z955 Presence of coronary angioplasty implant and graft: Secondary | ICD-10-CM | POA: Diagnosis not present

## 2018-05-31 DIAGNOSIS — Z7982 Long term (current) use of aspirin: Secondary | ICD-10-CM | POA: Diagnosis not present

## 2018-05-31 DIAGNOSIS — Z87891 Personal history of nicotine dependence: Secondary | ICD-10-CM | POA: Diagnosis not present

## 2018-05-31 DIAGNOSIS — Z7901 Long term (current) use of anticoagulants: Secondary | ICD-10-CM | POA: Diagnosis not present

## 2018-05-31 DIAGNOSIS — I6502 Occlusion and stenosis of left vertebral artery: Secondary | ICD-10-CM | POA: Diagnosis not present

## 2018-05-31 DIAGNOSIS — S8991XA Unspecified injury of right lower leg, initial encounter: Secondary | ICD-10-CM | POA: Diagnosis not present

## 2018-05-31 DIAGNOSIS — M25561 Pain in right knee: Secondary | ICD-10-CM | POA: Diagnosis not present

## 2018-05-31 DIAGNOSIS — R6884 Jaw pain: Secondary | ICD-10-CM | POA: Diagnosis not present

## 2018-05-31 DIAGNOSIS — R531 Weakness: Secondary | ICD-10-CM | POA: Diagnosis not present

## 2018-05-31 DIAGNOSIS — M25552 Pain in left hip: Secondary | ICD-10-CM | POA: Diagnosis not present

## 2018-05-31 DIAGNOSIS — R52 Pain, unspecified: Secondary | ICD-10-CM | POA: Diagnosis not present

## 2018-05-31 DIAGNOSIS — I10 Essential (primary) hypertension: Secondary | ICD-10-CM | POA: Diagnosis not present

## 2018-05-31 DIAGNOSIS — R000 Tachycardia, unspecified: Secondary | ICD-10-CM | POA: Diagnosis not present

## 2018-06-01 DIAGNOSIS — I69351 Hemiplegia and hemiparesis following cerebral infarction affecting right dominant side: Secondary | ICD-10-CM | POA: Diagnosis not present

## 2018-06-20 ENCOUNTER — Ambulatory Visit (INDEPENDENT_AMBULATORY_CARE_PROVIDER_SITE_OTHER): Payer: Medicare Other | Admitting: Family Medicine

## 2018-06-20 ENCOUNTER — Encounter: Payer: Self-pay | Admitting: Family Medicine

## 2018-06-20 ENCOUNTER — Ambulatory Visit (HOSPITAL_COMMUNITY)
Admission: RE | Admit: 2018-06-20 | Discharge: 2018-06-20 | Disposition: A | Discharge status: Home / Self Care | Payer: Medicare Other | Source: Ambulatory Visit | Attending: Family Medicine | Admitting: Family Medicine

## 2018-06-20 VITALS — BP 138/92 | HR 48 | Temp 98.0°F | Resp 16 | Ht 72.0 in | Wt 164.0 lb

## 2018-06-20 DIAGNOSIS — M545 Low back pain: Secondary | ICD-10-CM | POA: Diagnosis not present

## 2018-06-20 DIAGNOSIS — I2510 Atherosclerotic heart disease of native coronary artery without angina pectoris: Secondary | ICD-10-CM

## 2018-06-20 DIAGNOSIS — R29898 Other symptoms and signs involving the musculoskeletal system: Secondary | ICD-10-CM

## 2018-06-20 NOTE — Progress Notes (Signed)
Subjective:    Patient ID: Eugene [name], male    DOB: [birth year], 78 y.o.   MRN: [id number]  HPI  Unfortunately, patient was recently admitted to the hospital with symptoms of a right-sided stroke.  CT scan revealed an intracranial hemorrhage in the left thalamic tract.  I have copied relevant portions of the discharge summary and included them below for my reference:  Date of Admission: 08/25/2017 Date of Discharge: 08/31/2017  DISCHARGE DIAGNOSIS:  Principal Problem:   ICH (intracerebral hemorrhage) ([place]) - L thalamic d/t HTN in setting of Xarelto and plavix use Active Problems:   Hypertension   CAD (coronary artery disease)   Atrial fibrillation (HCC)   NSTEMI (non-ST elevated myocardial infarction) ([place])   Chronic anticoagulation   Dysmetria   Benign essential HTN   Hyperlipidemia   OSA (obstructive sleep apnea)   Diabetes mellitus type 2 in nonobese ([place])   Occlusion and stenosis of basilar artery   Hypertensive emergency   Sundowning   Hx of completed stroke      HISTORY OF PRESENT ILLNESS Eugene L Stricklandis an 78 y.o.malewith history of hypertension, hyperlipidemiaandatrial fibrillation forwhich he is on Xarelto. At baseline thepatient is quite independent, drivesa car, was able to take care of all of his activities of daily living. However, per family he may not have been keeping up with his medications in terms of exact doses and schedules, with the patient confirming this. Modified Rankin:Rankin Score=0. Hewent to sleep last night around 10 PM (LKW 08/24/2017 at 2000) and woke around 930 this a.m., notingthat he had decreased sensation along hisright side along with significant dysarthria. For that reason [facility]was [facility], where aCT of hisheadrevealeda left thalamic bleed. Blood pressure was 157/98. Patient is not a good historian and it is unclear what his blood pressures usually are. CT shows a L thalamic ICH. Intracerebral  Hemorrhage (ICH) Score = 0. He was transferred to [facility] for further evaluation and treatment.    HOSPITAL COURSE Mr.[name] a 78 y.o.malewith history of afib on Xarelto, CAD/MI on plavix, HLD, HTN, OSA admitted for right sided numbness and slurry speech.   ICH: left thalamic small ICH likely due to HTN in the setting of Xarelto and plavix use s/p reversal with Kcentra  Resultant partial expressive aphasia, right pronator drift  CT head left thalamic ICH with old left cerebellar infarct with calcification  MRILeft thalamic ICH   MRASevere BA stenosis vs. Occlusion with distal constitution  CTA head and neck - stable hematoma, BA occlusion with distal recon, b/l VA stenosis L>R.   Repeat CT hematoma stable  2D EchoEF 55-60%  LDL101  HgbA1c6.4  clopidogrel 75 mg daily and Xarelto (rivaroxaban) dailyprior to admission, now on No antithromboticdue to [place]. Add ASA 81mg  7 days post ICH as neuro stable (09/01/17), and repeat CT in 2-3 weeks, if ICH resolved, add eliquis to ASA 81mg .   Patient counseled to be compliant withhisantithrombotic medications  Ongoing aggressive stroke risk factor management  Therapy recommendations: CIR  Disposition: CIRplanned, however, family decided on SNF when copays discussed and desire for longer rehab time, then they changed their mind back to CIR. Will d/c to CIR today.  Wheezing with SOB, resolved  Mild wheezing and SOB  CXR no pulmonary edema  EKG afib, no ST-T changes  duoneb PRN  Lasix 40mg  once  Leukocytosis - WBC 7.4->10.3->11.7->8.9  Afib on Xarelto  Tele showed persistent AFib  Rate controlled, no RVR  Hold off AC for now.   Will repeat CT in 2-3 weeks, if ICH resolves, start eliquis  CAD/MI  On plavix PTA  Hold off for now due to [place]  ASA 81mg  7 days post ICH if neuro stable (09/01/17)  BA occlusion  Confirmed on CTA and MRA  With distal constitution from  collaterals  Consistent with chronic occlusion  Start ASA on 09/01/17 if neuro stable  BP goal 120-140, avoid low BP  Hypertensive Emergency  BP as high as 171/118  Treated with Cleviprex in hospital  On home norvasc, lisinopril and metoprolol, resumed in hopsital  BP goal < 140 in hospital   Long term BP goal 120-140. Avoid hypotension due to BA occlusion, avoid hypertension due to anticoagulation use.  Need BP check closely at home  Hyperlipidemia  Home meds: crestor 20   LDL 101, goal < 70  Now on crestor 20  Continue statin at discharge  Diabetes type II  On metformin at home  HGBA1c 6.4  SSI in hosp  Glucoses stable, WNL not on metformin here  Resume metformin per rehab  Sundowning  May have underlying cognitive impairment  on Seroquel 25 nightly  Improved since on seroquel  Other Stroke Risk Factors  Hx stroke/TIA- imaging showed old left cerebellar infarct with calcification  Obstructive sleep apnea    DISCHARGE PLAN  Disposition:  Inpatient rehab for ongoing PT, OT and ST  Repeat CT head in 2-3 weeks, if hemorrhage resolved, add Eliquis to aspirin 81 for secondary stroke prevention  Ongoing risk factor control by Primary Care Physician at time of discharge  Follow-up Susy Frizzle, MD in 2 weeks.  Follow-up in [facility] in 4 weeks, office to schedule an appointment   09/20/17 A head CT was performed on April 26 which revealed the expected evolution of his left thalamic intracranial hemorrhage.  At that time neurology cleared the patient to resume Eliquis in addition to his aspirin.  He was discharged from inpatient rehab on April 27.  He is here today for follow-up.  Prior to today, I have not seen the patient since 2017.  At that time he had suffered a non-ST elevation myocardial infarction.  This occurred while the patient was taking aspirin in addition to Pradaxa.  At that time  cardiology recommended to continue the patient on dual antiplatelet therapy in addition to his Pradaxa for 1 month then discontinue his aspirin with the plan of continuing his Plavix and Pradaxa indefinitely.  Ultimately that is how the patient was started on Plavix in addition to antithrombotic therapy although prior to his hospitalization he had been switched to Xarelto.  Patient is here today with his son who he is living with.  He is interested in resuming driving.  I walked with the patient around our clinic today.  He is dragging his right foot slightly.  He also demonstrates some right-sided neglect as he walks.  Several times he almost tripped over boxes or other items throughout the clinic near his right leg.  He also seems to have slowed reaction time.  Given these findings, I have recommended against resuming driving at the present time.  Otherwise he is doing very well.  He is not been taking Seroquel which they were giving to him in the hospital for sundowning.  His son states that he does not feel that his father needs that now that he is back at home.  He is much less confused.  He  is not demonstrating any evidence of delirium.  Furthermore he has not been taking the Enablex.  I explained to the patient that this is an anticholinergic medication.  This increases his risk for delirium and confusion as it does cross the blood-brain barrier potentially.  He states without the medication, he experiences frequency about every 3-4 hours at night.  Patient states that he can live with that at the present time.  Unfortunately he is smoking.  He has no desire to quit at the present time.  His blood pressure today is extremely low for this patient given the complexity of his medical problems.  He denies any dizziness or lightheadedness or syncope.  He is off his metformin which he was taking prior to his hospital admission.  His hemoglobin A1c on admission was 6.4 indicating excellent control.  However he is  no longer on any hypoglycemic agent.  He is also now taking Prozac 20 mg a day for depression.  However neither the son nor the father state that they feel that he needs this medication.  He is not certain why he is taking it.  This is something that was started during the hospitalization.  At that time, my plan was: I have recommended avoiding Enablex due to polypharmacy and its anticholinergic effects.  Patient will discontinue Seroquel as he has not been taking it for more than a week and does not seem to use the medication or require it.  I have recommended decreasing Prozac to 10 mg a day and reassessing in 2 to 3 weeks.  If no significant changes seen, I would discontinue the medicine altogether.  His blood pressure today is low.  Ideally I like to keep the patient between 062 and 376 systolic over 28-31 diastolic.  Patient will buy a blood pressure monitor and check his blood pressure at home.  If consistently low, I will decrease his antihypertensive medication and then recheck his blood pressure in 2 to 3 weeks.  I explained to the patient why he needs to stay on aspirin due to his history of stroke as well as his history of non-ST elevation myocardial infarction in addition to his antithrombotic therapy and the version of Eliquis given his atrial fibrillation.  However I also explained that we need to maintain strict control of his blood pressure to avoid hypertensive urgency.  Also explained the need we need to avoid hypotension given his basilar artery stenosis.  Reassess the patient in 3 weeks  10/15/17 Unfortunately the patient was recently admitted to the hospital again.  I have copied relevant portions of his discharge summary and included them below for my reference: Admit date: 10/04/2017 Discharge date: 10/10/2017  Admitted From: Home Disposition: Home  Discharge Diagnoses:  Principal Problem:   Acute ischemic cerebrovascular accident (CVA) involving anterior cerebral artery territory  [facility])   Active Problems:   Suicidal behavior with attempted self-injury ([place])   Depression   Atrial fibrillation ([place])   Diabetes mellitus type 2, noninsulin dependent ([place])   Hyperlipidemia   Occlusion and stenosis of basilar artery   Thalamic hemorrhage (HCC)   Right hemiparesis (HCC)   Stage 2 chronic kidney disease   TIA involving basilar artery   Palliative care by specialist   Goals of care, counseling/discussion  Brief narrative/HPI Please refer to admission H&P for details, in brief,78 year old male with A. fib, hypertension, BPH, anxiety/depression, tobacco abuse, recent thalamic ICH(6 weeks back) presented to the ED with vertigo, poor balance and left-sided hearing impairment with  worsening right-sided weakness. After his recent left thalamic ICH he had mild residual right upper extremity weakness. Patient symptoms occurred about 36 hours prior to presentation and due to ongoing symptoms he became frustrated and attempted to kill himself by blowing his head off with a pistol. Patient son noticed this and intervened and brought patient to the ED. CT head and MRI brain negative for acute findings but given his clinical presentation neurology recommended this is likely acute infarction of the ipsilateral AICA causing acute unilateral hearing loss and vertigo.    Hospital course  Principal problem Acute infarction of AICA Symptoms of vertigo associated with unilateral hearing loss likely due to acute infarction of ipsilateral AICA per neurology which usually is not picked up on MRI. Had hemorrhagic ICH 6 weeks.Neurology recommends full dose aspirin until 5/28 and then transition to 81 mg aspirin and Eliquis 5 mg twice daily.  (He was on this regimen at home prior to admission).Further stroke work-up not needed. Continue statin. Has residual left-sided hearing loss.  Outpatient PT.  Depression with suicidal ideation. Patient denies suicidal ideation for past 2  days.  Also does not feel depressed today.  Telemetry psych initially recommended inpatient psych admission once medically cleared.  However given no further suicidal ideation and active depression they have cleared him to be discharged home with outpatient follow-up.  Placed on fluoxetine.  Intracranial hemorrhage of the left thalamus on 4/10. Patient was on Xarelto prior to this and was transitioned to apixaban. Holding apixaban until 5/28 and currently on full dose aspirin.  Paroxysmal A. fib Rate controlled on the monitor.   Now on full dose aspirin until 5/28, thereafter he will be on his home dose of baby aspirin and apixaban.   Essential hypertension Continue amlodipine, HCTZ and lisinopril.  Acute kidney injury Mild, possibly prerenal.  HCTZ and lisinopril were held and can be resumed upon discharge.  Coronary artery disease with history of LAD stenting in 2017 Continue aspirin and statin. Intolerant to beta-blocker due to bradycardia.  Diabetes mellitus type 2 CBG stable.  Not on medication.  A1c of 6.4.     10/15/17 Patient is here today with his son.  I directly questioned the patient about his suicide attempt.  Patient is calm and very matter-of-fact.  He states that he is lived a good life.  He does not want to be a burden to his family specifically his son.  He states that when he had this most recent stroke, he became extremely afraid that he would become a vegetable and therefore burden his family.  He was also afraid that he would then not be able to do anything to help with the situation.  Therefore, the patient decided to commit suicide.  He called the sheriff's department so that they would come and find the body, he put a gun to his head and pulled the trigger.  However his son had removed the ammunition and therefore the patient was unsuccessful.  This prompted his son to take him directly to the hospital where he was admitted.  Patient continues to  endorse the same sentiment.  He states that he is not "crazy".  However his primary concern is that his situation is only going to worsen and that he will only be a burden to his son and therefore he wants to die with dignity.  However he states that he has no plan to commit suicide because all available options have been removed from him.  His son is  controlling all of his medication, the weapons have been removed from the home, and the patient has no other plan of suicide.  He is currently on Prozac.  At that time, my plan was: More than 50 minutes were spent with the patient and his son discussing the situation.  I am concerned that the patient still wants to die despite telling the psychiatrist that he has no suicidal thoughts.  However this is not apparently due to mental illness but rather over his concern of being a burden and his right to die with dignity.  His fear is that another stroke will rob him of that right and leave him a burden.  I explained to the patient that although his situation is extremely guarded that I do believe he has hope for a good recovery given time.  We also discussed not making a rash decision now as his situation may improve with time and he may regret that decision in the future.  I then discussed the situation with the patient and his son.  We discussed returning to the hospital and having him involuntarily committed due to risk for suicide.  Both the patient and the son do not want this to happen.  They would like me to maximize therapy for depression and therefore I will add Abilify 5 mg a day and see the patient back in 1 week.  10/22/17 Patient is here today with his daughter.  His daughter states that since he started the Abilify, he seems less depressed.  He is not endorsing suicide.  He is still very negative and pessimistic about his future improvement however his spirits seem to have improved based on the family's impressions.  Patient denies suicidal thoughts or  suicidal plan.  He continues to be frustrated with his inability to hear from his left ear and the weakness in his right leg and his issues with balance.  He continues to be very negative.  He frequently states that this will never get better.  He states that I will be a burden.  He states that nothing will improve.  He wants to stop his medication.  He does not want to try physical therapy.  At that time, my plan was: Patient's depression seems to be better however he continues to demonstrate anger and negativity over his diagnosis and his condition.  I believe he is transitioning through the stages of grieving.  As I explained to the patient and his family, I believe that his anger and negativity will gradually improve with time and I believe they will also improve as he hopefully experiences some recovery from his stroke.  Therefore I have recommended physical therapy to try to improve the strength in his right leg and his balance.  Otherwise I will make no changes in his medication at this time.  I am happy to see that his suicidal thoughts have improved per the family's report.  I will recheck the patient in 4 weeks or sooner if problems arise  11/22/17 Patient is doing much better.  He is smiling today on his exam.  He states he feels better.  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  He states that he no longer feels as depressed.  He denies any suicidal ideation.  His son accompanies him today and states that his father is doing much better.  He no longer fears that his father is depressed or suicidal.  Patient states that for more than a month now, he is feeling more optimistic and seeing his body gradually recover.  His only lingering neurologic deficit is some mild hearing loss in his left ear and some mild weakness with right hip flexion.  However I walked with the patient today around the office and he is able to walk up and down the hallways with no  difficulty.  He can literally turn on a dime without losing his balance.  He has normal strength in his upper extremities.  He has no visible neurologic deficit.  At that time, my plan was: I am extremely happy with the progress the patient is made.  The suicidal ideation has completely resolved now for more than a month.  The patient is smiling and optimistic about his future.  He is requesting to be able to drive again.  Medically there is no contraindication to the patient driving.  He has full control of his arms and his legs.  He is walking without a cane and his balance is normal.  His hearing is sufficient and his vision is sufficient to allow him to drive.  From the standpoint of his depression, he appears to be in complete remission.  He denies any suicidal ideation.  His son states that he has no concern and that he believes his father is recovering well and almost back to normal.  His son would like him to be able to drive as well.  He is still living with his father as he has been ever since his stroke.  We did discuss his father living independently.  I would like to give it another month or so with continued improvement and stability prior to the son allowing his father to live independently but at the present time I am very optimistic about his future.  Reassess the patient in 2 to 3 months  12/14/17 Since I last saw the patient, there has been a deterioration in his condition.  He reports more pessimism, less energy, decreased appetite, weight loss, he has lost 14 pounds since May.  Both he and his son report depression.  His son is concerned because last night he asked him about the location of his guns.  This was a conversation he was having prior to thoughts of suicide.  Son is concerned that his depression is worsening.  Patient denies any suicidal plan.  He does emphasize that he does not want to be a burden and that he has had a long life and he is also frustrated by his lack of improvement.   When I question his lack of improvement, he reports trouble with word finding, some mild weakness in his right upper extremity where he does have some slight pronator drift as well as difficulty with balance.  He is not driving and he is still living with his family.  At that time, my plan was: Discontinue Prozac and begin Trintellix 10 mg a day and reassess the patient in 3 weeks.  May gradually try to transition off of Abilify given his balance issues particularly if he sees more improvement on Trintellix.  Monitor for nausea.  Monitor for closely for suicidal ideation.  01/04/18  Clinically the patient seems even worse.  He is here today with his daughter.  Seems more confused and more forgetful than when I last saw him.  His appetite has worsened.  He is lost another 3 pounds.  He is down 6 pounds just in the last month.  He states that he has no appetite.  He simply has no desire to eat.  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  He is no longer "piddling" in his shop and around his home.  He is not motivated.  He seems very sad and withdrawn today.  He also seems increasingly more confused.  He seems to not realize what medicine is treating what.  He certainly does not appear to be able to live independently and manage his affairs.  Denies suicidal ideation but he certainly seems pessimistic with no hope for the future.  Has very little energy.  AT that time, my plan was: Patient is unable to live independently.  He is unable to manage his medications I feel safely.  Therefore I want to consult home health and see if we can have a nurse assessment to try to help manage his medication to ensure that he is taking them properly.  I have asked the family to try to arrange a family member to come by every evening and make sure that he is taking his medication properly for the day.  I also would like him to make sure that he is at least eating 1 or 2 good meals a day.  I have recommended that he wear a first  alert fall monitor that would be able to be used if he were to fall or could sense if he fell in order to send help.  I want him to discontinue Trintellix and replace with Zoloft 50 mg a day.  Otherwise will make no changes at this time.  I certainly think the patient was doing better on Prozac that he was on Trintellix and I am hoping that Zoloft will help him sleep better and may improve his anxiety management compared to the Trintellix  02/01/18 Patient is here today with his daughter.  He thinks the depression is doing better.  He is sleeping approximately 6 hours every night.  He denies any further suicidal ideation.  Unfortunately he has lost 5 more pounds.  He is afraid to use the stove because he is afraid that he will forget to turn it off.  He is demonstrating short-term memory problems.  I performed a Mini-Mental status exam today.  The patient is able to correctly tell me the date time and location.  However he can only remember 1 out of 3 objects or recall.  He is unable to perform serial sevens at all.  He is able to get 2 letters correct on spelling world in reverse.  At best he scores 25 out of 30.  His family has already arranged power of attorney and is managing his finances.  However he does not feel safe driving despite his reaction time and physical strength being adequate to do so.  His family is still living with him to supervise his medication, to supervise him eating however they are no longer concerned about depression or suicidal thoughts.  It appears to be more an issue with vascular dementia and memory problems that are keeping the patient from being independent.  He also feels very unsteady on his feet.  He is using a cane and a walker, but he feels his balance is weak and compromised and he feels like he is at high risk for falling.  At that time, my plan was: I believe the depression is better.  I will make no changes in his dose of Zoloft.  However the patient does not appear to  be able to live independently.  He is not driving.  His family is managing his finances.  I will arrange home health physical therapy to help work with the patient regarding balance.  I will also contact [employer] to see if we can arrange meal service for the patient to ensure that he is getting adequate nutrition.  I will also consult home health to have a nurse come by and help supervise his medication or at least arrange a pillbox. Wt Readings from Last 3 Encounters:  06/20/18 164 lb (74.4 kg)  02/01/18 168 lb (76.2 kg)  01/04/18 173 lb (78.5 kg)   06/20/18 Was recently admitted at an outside hospital in [place] due to right-sided weakness concern for stroke.  I do not have a copy of his medical records from that hospitalization however MRI did not show any new CVA.  It was thought that his weakness on his right side was secondary to deconditioning.  Therefore it was recommended that he have physical therapy.  He is here today for follow-up.  He is here today with his son.  Apparently he drove his truck to the mailbox and when he was driving back to the house, his foot slipped off the brake and he drove through the [place].  When he was circling back around, he was able to park the truck correctly however he then fell out of the truck losing his balance.  He laid on the ground for approximately 1 hour until the neighbor heard him.  Few days later, his legs suddenly became weak and his son took him to the hospital where work-up was negative for stroke.  He does complain of some low back pain and persistent right leg weakness.  He denies any numbness or tingling in his right leg.  Muscle strength is 5/5 equal and symmetric in both legs today.  He is walking with a cane however objectively he feels that his right leg is weaker.  His blood pressure today is borderline and he also is bradycardic.  He is still taking amlodipine.  His blood pressure was low in the hospital.  I am concerned that his blood  pressure may have dropped causing him to lose his balance and fall.  I am also concerned about overmedication with Abilify.  His depression seems stable now.  His son states that he is been doing better.  He denies any suicidal ideation today. Past Medical History:  Diagnosis Date  . Anxiety   . Atrial fibrillation ([place])   . BPH (benign prostatic hypertrophy)   . Cataracts, both eyes   . Coronary artery disease   . Depressed   . Hyperlipidemia   . Hypertension   . ICH (intracerebral hemorrhage) ([place]) 08/2017  . MI (myocardial infarction) ([place])   . OSA (obstructive sleep apnea)   . Pre-diabetes   . Stroke [facility])    Past Surgical History:  Procedure Laterality Date  . CARDIAC CATHETERIZATION N/A 06/26/2015   Procedure: Left Heart Cath and Coronary Angiography;  Surgeon: Burnell Blanks, MD;  Location: [place] [facility];  Service: Cardiovascular;  Laterality: N/A;  . CARDIAC CATHETERIZATION  06/26/2015   Procedure: Coronary Stent Intervention;  Surgeon: Burnell Blanks, MD;  Location: [place] [facility];  Service: Cardiovascular;;  . CARDIAC CATHETERIZATION  06/26/2015   Procedure: Coronary Balloon Angioplasty;  Surgeon: Burnell Blanks, MD;  Location: [place] [facility];  Service: Cardiovascular;;  . CORONARY STENT PLACEMENT  10/2004   Current Outpatient  Medications on File Prior to Visit  Medication Sig Dispense Refill  . amLODipine (NORVASC) 10 MG tablet Take 1 tablet (10 mg total) by mouth daily. 30 tablet 0  . ARIPiprazole (ABILIFY) 5 MG tablet TAKE 1 TABLET BY MOUTH EVERY DAY 30 tablet 5  . aspirin 81 MG EC tablet Take 1 tablet (81 mg total) by mouth daily. Take 4 tablets daily for next 2 days then 1 tablet daily 30 tablet 12  . ELIQUIS 5 MG TABS tablet TAKE 1 TABLET(5 MG) BY MOUTH TWICE DAILY 60 tablet 5  . hydrochlorothiazide (MICROZIDE) 12.5 MG capsule TAKE 1 CAPSULE(12.5 MG) BY MOUTH DAILY 90 capsule 3  . lisinopril (PRINIVIL,ZESTRIL) 20 MG tablet TAKE 1  TABLET(20 MG) BY MOUTH DAILY 90 tablet 3  . rosuvastatin (CRESTOR) 20 MG tablet TAKE 1 TABLET(20 MG) BY MOUTH DAILY 90 tablet 1  . sertraline (ZOLOFT) 50 MG tablet TAKE 1 TABLET BY MOUTH EVERY DAY 30 tablet 3   No current facility-administered medications on file prior to visit.     No Known Allergies Social History   Socioeconomic History  . Marital status: Divorced    Spouse name: Not on file  . Number of children: Not on file  . Years of education: Not on file  . Highest education level: Not on file  Occupational History  . Occupation: Retired Civil engineer, contracting for [employer]  . Financial resource strain: Not on file  . Food insecurity:    Worry: Not on file    Inability: Not on file  . Transportation needs:    Medical: Not on file    Non-medical: Not on file  Tobacco Use  . Smoking status: Former Smoker    Packs/day: 1.00    Years: 50.00    Pack years: 50.00    Types: Cigarettes    Last attempt to quit: 11/28/2017    Years since quitting: 0.5  . Smokeless tobacco: Never Used  Substance and Sexual Activity  . Alcohol use: No  . Drug use: No  . Sexual activity: Not Currently  Lifestyle  . Physical activity:    Days per week: Patient refused    Minutes per session: Patient refused  . Stress: Not on file  Relationships  . Social connections:    Talks on phone: Patient refused    Gets together: Patient refused    Attends religious service: Patient refused    Active member of club or organization: Patient refused    Attends meetings of clubs or organizations: Patient refused    Relationship status: Patient refused  . Intimate partner violence:    Fear of current or ex partner: Not on file    Emotionally abused: Not on file    Physically abused: Not on file    Forced sexual activity: Not on file  Other Topics Concern  . Not on file  Social History Narrative  . Not on file       Review of Systems  All other systems reviewed and are negative.       Objective:   Physical Exam  Constitutional: He is oriented to person, place, and time. He appears well-developed and well-nourished. No distress.  Neck: No JVD present.  Cardiovascular: Normal rate and normal heart sounds. An irregularly irregular rhythm present.  Pulmonary/Chest: Effort normal. No stridor. No respiratory distress. He has no wheezes.  Abdominal: Soft. Bowel sounds are normal.  Musculoskeletal: He exhibits no edema.  Neurological: He is alert and oriented to person,  place, and time. He displays normal reflexes. No cranial nerve deficit or sensory deficit. He exhibits normal  muscle tone. Coordination is abnormal. Patient is now using a walker to ambulate due to feeling unsteady on his feet Skin: He is not diaphoretic.  Vitals reviewed.  Has a slight pronator drift on the right.      Assessment & Plan:  Right leg weakness - Plan: DG Lumbar Spine Complete, COMPLETE METABOLIC PANEL WITH GFR, Lipid panel, CK, CBC with Differential/Platelet  ASCVD (arteriosclerotic cardiovascular disease) - Plan: COMPLETE METABOLIC PANEL WITH GFR, Lipid panel, CK, CBC with Differential/Platelet  Given his right leg weakness and low back pain I will obtain an x-ray of the lumbar spine to rule out significant degenerative disc disease or other pathology that would cause a right-sided lumbar radiculopathy.  I am concerned he may have had rhabdomyolysis after laying on the ground for an hour or so it causes leg weakness I will check a CK level.  Given his history of strokes and ASCVD, I will also check a CBC, CMP, and fasting lipid panel.  Given his relatively low blood pressure and bradycardia I have recommended discontinuing amlodipine.  I have also recommended discontinuing Abilify to avoid polypharmacy given the fact he is falling and his balance has become affected as this may be contributing.

## 2018-06-21 LAB — COMPLETE METABOLIC PANEL WITH GFR
AG Ratio: 1.8 (calc) (ref 1.0–2.5)
ALT: 7 U/L — ABNORMAL LOW (ref 9–46)
AST: 10 U/L (ref 10–35)
Albumin: 4 g/dL (ref 3.6–5.1)
Alkaline phosphatase (APISO): 79 U/L (ref 35–144)
BUN: 20 mg/dL (ref 7–25)
CO2: 28 mmol/L (ref 20–32)
Calcium: 9 mg/dL (ref 8.6–10.3)
Chloride: 105 mmol/L (ref 98–110)
Creat: 1.09 mg/dL (ref 0.70–1.18)
GFR, Est African American: 74 mL/min/{1.73_m2} (ref >=60)
GFR, Est Non African American: 64 mL/min/{1.73_m2} (ref >=60)
Globulin: 2.2 g/dL (calc) (ref 1.9–3.7)
Glucose, Bld: 83 mg/dL (ref 65–99)
Potassium: 4.3 mmol/L (ref 3.5–5.3)
Sodium: 141 mmol/L (ref 135–146)
TOTAL PROTEIN: 6.2 g/dL (ref 6.1–8.1)
Total Bilirubin: 0.7 mg/dL (ref 0.2–1.2)

## 2018-06-21 LAB — CBC WITH DIFFERENTIAL/PLATELET
Absolute Monocytes: 766 cells/uL (ref 200–950)
Basophils Absolute: 8 cells/uL (ref 0–200)
Basophils Relative: 0.1 %
Eosinophils Absolute: 79 cells/uL (ref 15–500)
Eosinophils Relative: 1 %
HEMATOCRIT: 40.8 % (ref 38.5–50.0)
Hemoglobin: 13.8 g/dL (ref 13.2–17.1)
LYMPHS ABS: 1857 {cells}/uL (ref 850–3900)
MCH: 31 pg (ref 27.0–33.0)
MCHC: 33.8 g/dL (ref 32.0–36.0)
MCV: 91.7 fL (ref 80.0–100.0)
MPV: 9.9 fL (ref 7.5–12.5)
Monocytes Relative: 9.7 %
Neutro Abs: 5190 cells/uL (ref 1500–7800)
Neutrophils Relative %: 65.7 %
Platelets: 211 10*3/uL (ref 140–400)
RBC: 4.45 10*6/uL (ref 4.20–5.80)
RDW: 14.5 % (ref 11.0–15.0)
Total Lymphocyte: 23.5 %
WBC: 7.9 10*3/uL (ref 3.8–10.8)

## 2018-06-21 LAB — CK: Total CK: 25 U/L — ABNORMAL LOW (ref 44–196)

## 2018-06-21 LAB — LIPID PANEL
Cholesterol: 113 mg/dL (ref <200)
HDL: 51 mg/dL (ref >=40)
LDL Cholesterol (Calc): 41 mg/dL (calc)
Non-HDL Cholesterol (Calc): 62 mg/dL (calc) (ref <130)
TRIGLYCERIDES: 120 mg/dL (ref <150)
Total CHOL/HDL Ratio: 2.2 (calc) (ref <5.0)

## 2018-07-05 DIAGNOSIS — Z87891 Personal history of nicotine dependence: Secondary | ICD-10-CM | POA: Diagnosis not present

## 2018-07-05 DIAGNOSIS — E119 Type 2 diabetes mellitus without complications: Secondary | ICD-10-CM | POA: Diagnosis not present

## 2018-07-05 DIAGNOSIS — I4891 Unspecified atrial fibrillation: Secondary | ICD-10-CM | POA: Diagnosis not present

## 2018-07-05 DIAGNOSIS — Z7982 Long term (current) use of aspirin: Secondary | ICD-10-CM | POA: Diagnosis not present

## 2018-07-05 DIAGNOSIS — I69351 Hemiplegia and hemiparesis following cerebral infarction affecting right dominant side: Secondary | ICD-10-CM | POA: Diagnosis not present

## 2018-07-05 DIAGNOSIS — I10 Essential (primary) hypertension: Secondary | ICD-10-CM | POA: Diagnosis not present

## 2018-07-05 DIAGNOSIS — Z79899 Other long term (current) drug therapy: Secondary | ICD-10-CM | POA: Diagnosis not present

## 2018-07-05 DIAGNOSIS — Z9181 History of falling: Secondary | ICD-10-CM | POA: Diagnosis not present

## 2018-07-05 DIAGNOSIS — Z955 Presence of coronary angioplasty implant and graft: Secondary | ICD-10-CM | POA: Diagnosis not present

## 2018-07-05 DIAGNOSIS — Z7901 Long term (current) use of anticoagulants: Secondary | ICD-10-CM | POA: Diagnosis not present

## 2018-07-08 ENCOUNTER — Telehealth: Payer: Self-pay | Admitting: Family Medicine

## 2018-07-08 NOTE — Telephone Encounter (Signed)
[facility] called pt will no longer be receiving their services, pt is being placed in an assisted living facility.

## 2018-07-28 ENCOUNTER — Other Ambulatory Visit: Payer: Self-pay | Admitting: Family Medicine

## 2018-07-28 MED ORDER — LISINOPRIL 20 MG PO TABS: ORAL_TABLET | ORAL | 3 refills | Status: DC

## 2018-08-11 DIAGNOSIS — Z0120 Encounter for dental examination and cleaning without abnormal findings: Secondary | ICD-10-CM | POA: Diagnosis not present

## 2018-09-01 ENCOUNTER — Other Ambulatory Visit: Payer: Self-pay | Admitting: Family Medicine

## 2018-10-06 ENCOUNTER — Other Ambulatory Visit: Payer: Self-pay | Admitting: Family Medicine

## 2018-10-27 ENCOUNTER — Other Ambulatory Visit: Payer: Self-pay | Admitting: Family Medicine

## 2018-11-15 ENCOUNTER — Other Ambulatory Visit: Payer: Self-pay | Admitting: Family Medicine

## 2019-01-05 ENCOUNTER — Other Ambulatory Visit: Payer: Self-pay | Admitting: Family Medicine

## 2019-01-26 ENCOUNTER — Other Ambulatory Visit: Payer: Self-pay | Admitting: Family Medicine

## 2019-03-13 DIAGNOSIS — Z23 Encounter for immunization: Secondary | ICD-10-CM | POA: Diagnosis not present

## 2019-04-06 ENCOUNTER — Other Ambulatory Visit: Payer: Self-pay | Admitting: Family Medicine

## 2019-05-02 ENCOUNTER — Other Ambulatory Visit: Payer: Self-pay | Admitting: Family Medicine

## 2019-05-03 DIAGNOSIS — R531 Weakness: Secondary | ICD-10-CM | POA: Diagnosis not present

## 2019-05-03 DIAGNOSIS — Z1159 Encounter for screening for other viral diseases: Secondary | ICD-10-CM | POA: Diagnosis not present

## 2019-05-03 DIAGNOSIS — E1169 Type 2 diabetes mellitus with other specified complication: Secondary | ICD-10-CM | POA: Diagnosis not present

## 2019-05-03 DIAGNOSIS — I4891 Unspecified atrial fibrillation: Secondary | ICD-10-CM | POA: Diagnosis not present

## 2019-05-03 DIAGNOSIS — J189 Pneumonia, unspecified organism: Secondary | ICD-10-CM | POA: Diagnosis not present

## 2019-05-03 DIAGNOSIS — Z9181 History of falling: Secondary | ICD-10-CM | POA: Diagnosis not present

## 2019-05-03 DIAGNOSIS — M6281 Muscle weakness (generalized): Secondary | ICD-10-CM | POA: Diagnosis not present

## 2019-05-19 DEATH — deceased

## 2019-08-12 IMAGING — MR MR HEAD WO/W CM
10 of 13 series · 27 of 48 positions shown · IV contrast (19 MH)
Comparison: Head CT without contrast 4755 hours.

ADDENDUM:
This study was discussed by telephone with Dr. PILY SESSIONS on
CLINICAL DATA: 76-year-old male with code stroke presentation and
acute left thalamic hemorrhage on head CT earlier today.

EXAM:
MRI HEAD WITHOUT AND WITH CONTRAST
MRA HEAD WITHOUT CONTRAST
TECHNIQUE: Multiplanar, multiecho pulse sequences of the brain and surrounding
structures were obtained without and with intravenous contrast.
Angiographic images of the head were obtained using MRA technique
without contrast.
CONTRAST:  15mL MULTIHANCE GADOBENATE DIMEGLUMINE 529 MG/ML IV SOLN

[Series 3: ax (id) 2 · axial · 1.0mm · 0.43mm/px · z∈[-38,-20]mm · 2 of 184 slices shown]
[im 1/184]
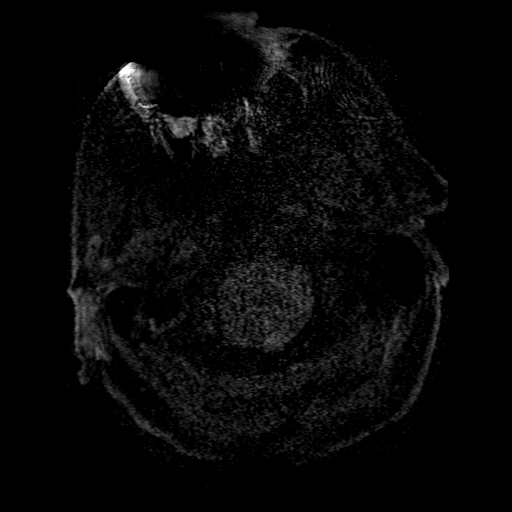
[im 37/184]
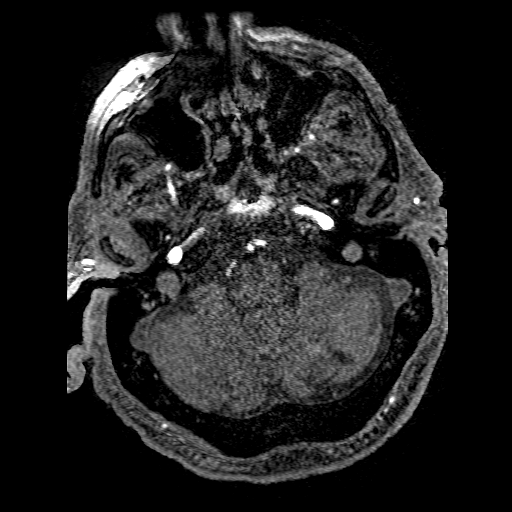

[Series 4: DWI · axial · 3.0mm · 0.94mm/px · z∈[-48,+99]mm · 6 of 100 slices shown (1 of 2)]
[im 1/100]
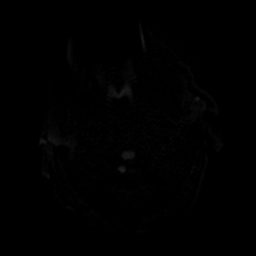
[im 20/100]
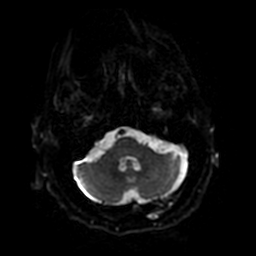
[im 40/100]
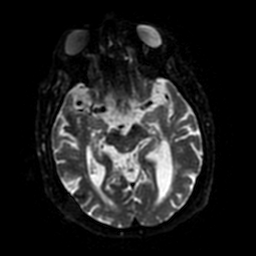
[im 60/100]
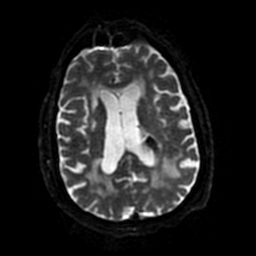
[im 80/100]
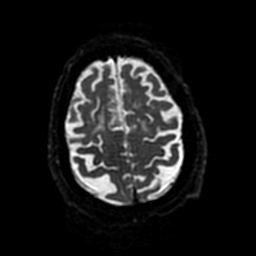
[im 100/100]
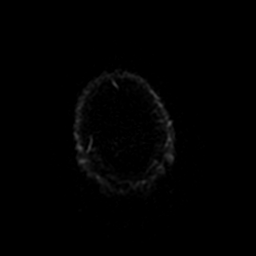

[Series 5: DWI · coronal · 4.0mm · 0.94mm/px · 4 of 72 slices shown (2 of 2)]
[im 1/72]
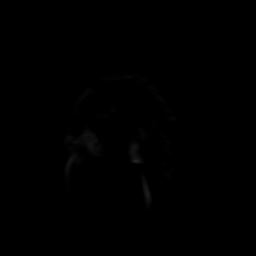
[im 24/72]
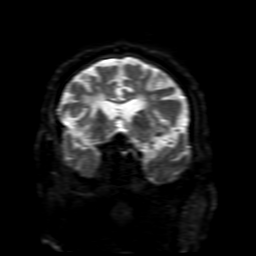
[im 48/72]
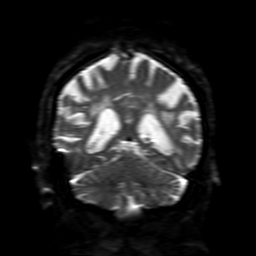
[im 72/72]
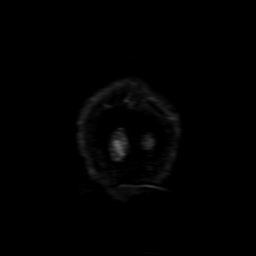

[Series 6: FLAIR · sagittal · 5.0mm · 0.47mm/px · 2 of 25 slices shown (1 of 2)]
[im 1/25]
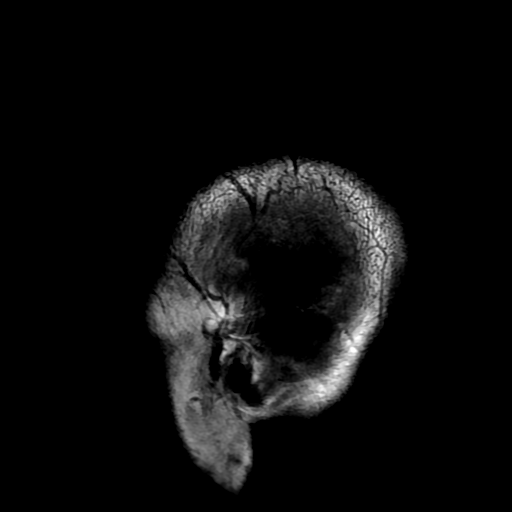
[im 25/25]
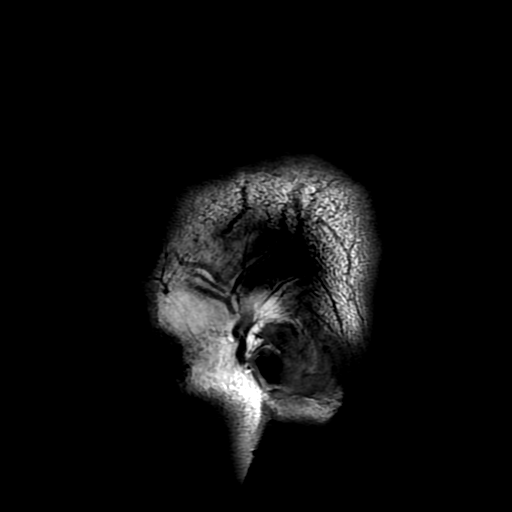

[Series 7: T2 · axial · 5.0mm · 0.47mm/px · z∈[-54,+114]mm · 2 of 29 slices shown (1 of 2)]
[im 1/29]
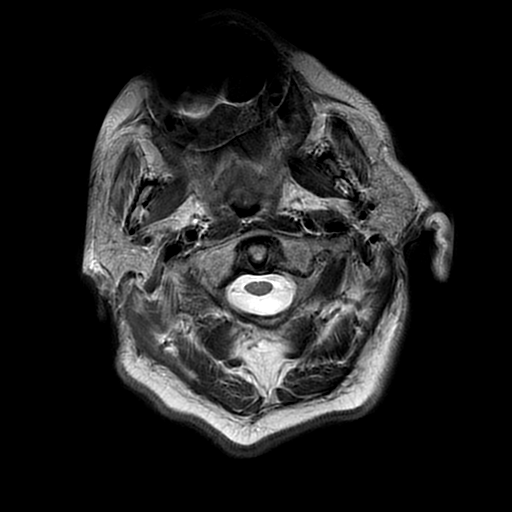
[im 29/29]
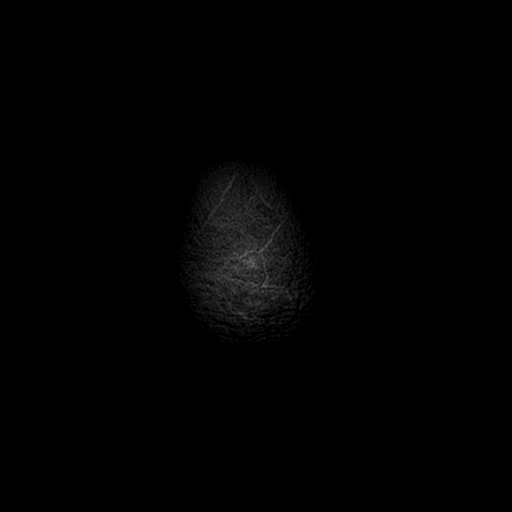

[Series 8: FLAIR · axial · 5.0mm · 0.47mm/px · z∈[-54,+114]mm · 2 of 29 slices shown (2 of 2)]
[im 1/29]
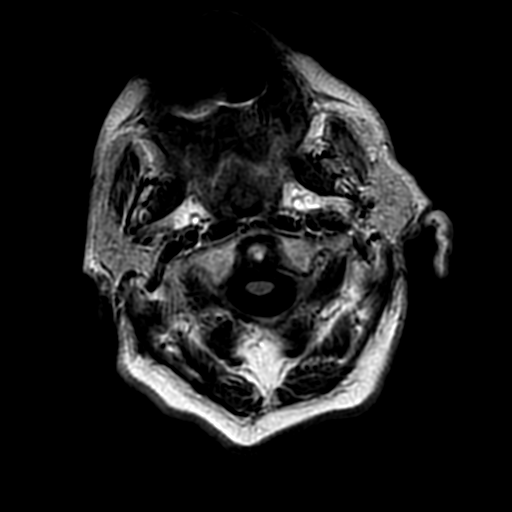
[im 29/29]
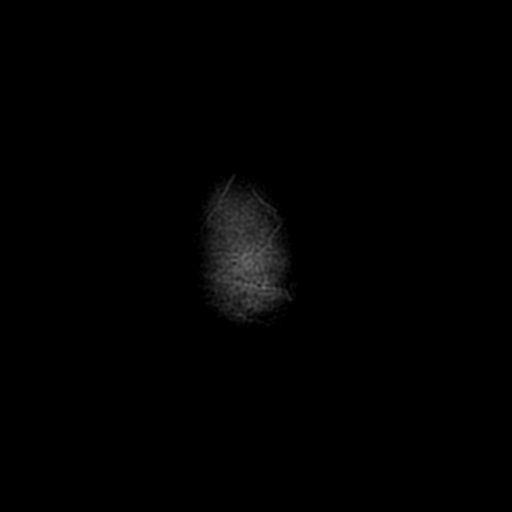

[Series 11: T2 · coronal · 5.0mm · 0.47mm/px · 2 of 29 slices shown (2 of 2)]
[im 1/29]
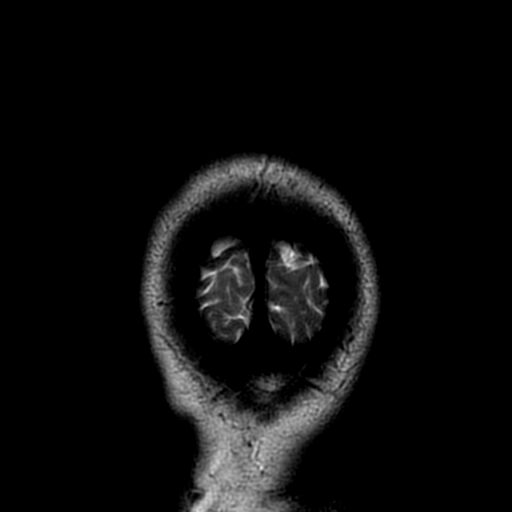
[im 29/29]
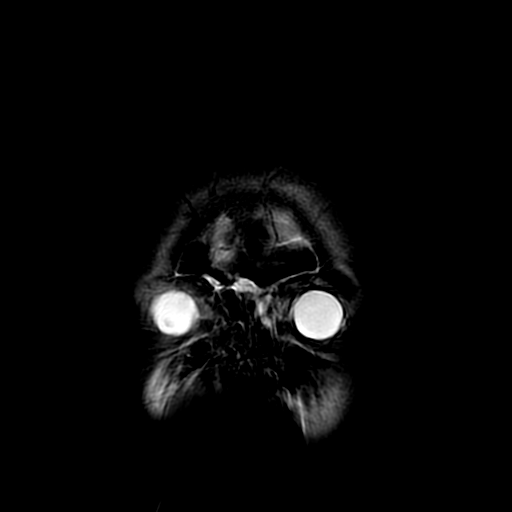

[Series 13: T1 · coronal · 5.0mm · 0.47mm/px · 2 of 28 slices shown]
[im 1/28]
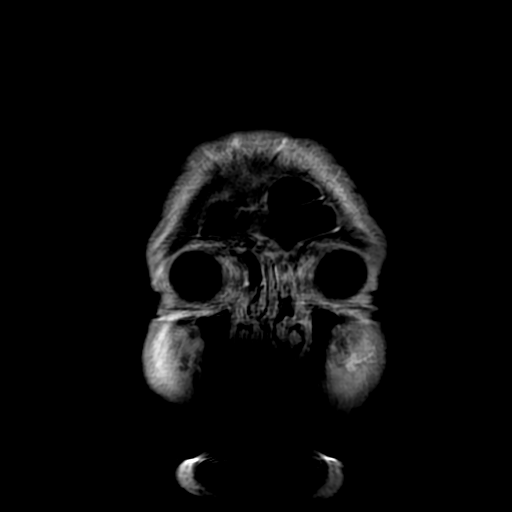
[im 28/28]
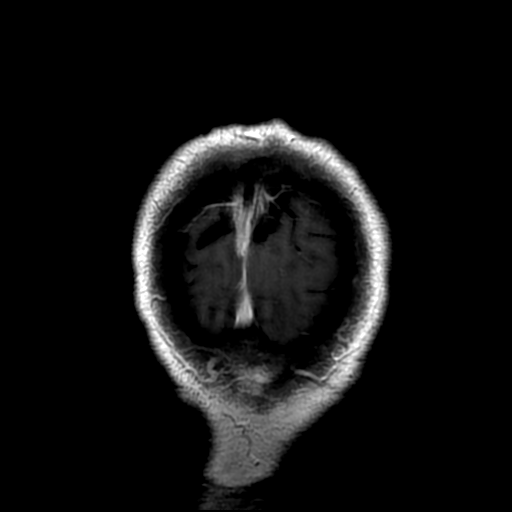

[Series 450: ADC · axial · 3.0mm · 0.94mm/px · z∈[-48,+99]mm · 3 of 50 slices shown (1 of 2)]
[im 1/50]
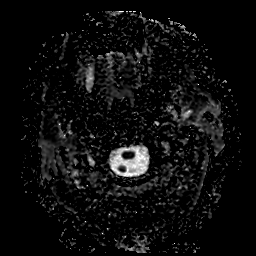
[im 25/50]
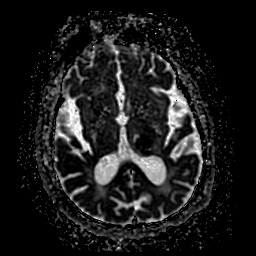
[im 50/50]
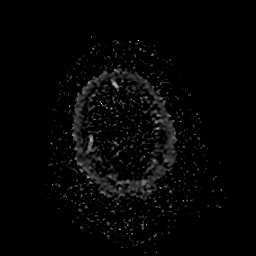

[Series 550: ADC · coronal · 4.0mm · 0.94mm/px · 2 of 36 slices shown (2 of 2)]
[im 1/36]
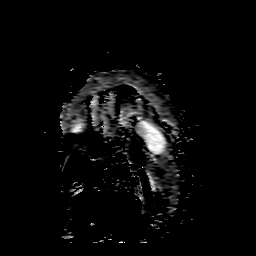
[im 36/36]
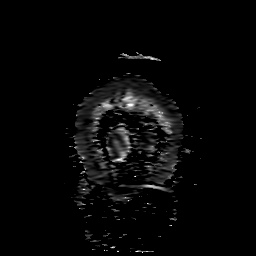

[27 of 48 positions shown; findings below may reference images not displayed]

FINDINGS: MRI HEAD FINDINGS

Brain: Oval mixed hyper- and hypointense T2 signal and iso- to
hypointense T1 signal hemorrhage re-demonstrated in the left
thalamus encompassing 20 x 21 x 26 millimeters (AP by transverse by
CC) for an estimated blood volume of 5-6 mL. Surrounding edema with
mild regional mass effect. No intraventricular extension identified.
No extra-axial extension identified. Associated diffusion
susceptibility artifact. No convincing surrounding area of
restricted diffusion. There are punctate areas of enhancement within
the hemorrhage (series 12, images 26 and 27), but this represents
delayed phase enhancement and not arterial phase spot sign..

No restricted diffusion elsewhere. There are several superimposed
chronic microhemorrhages elsewhere in the left hemisphere. Chronic
hemosiderin and/or dystrophic calcification noted in the left
cerebellum. Chronic infarcts in the left cerebellum, bilateral basal
ganglia, and confluent bilateral cerebral white matter T2 and FLAIR
hyperintensity. No cortical encephalomalacia.

No other abnormal intracranial enhancement. No dural thickening. No
midline shift, ventriculomegaly, extra-axial collection.
Cervicomedullary junction and pituitary are within normal limits.

Vascular: The major anterior circulation flow voids are preserved.

There is intermittent loss of the posterior circulation flow voids,
see MRA findings below.

The major dural venous sinuses are enhancing and appear to be
patent.

Skull and upper cervical spine: Negative visible cervical spine.
Normal bone marrow signal.

Sinuses/Orbits: Postoperative changes to both globes. Moderate
ethmoid sinus mucosal thickening.

Other: Visible internal auditory structures appear normal. Negative
scalp and face soft tissues.

MRA HEAD FINDINGS

Antegrade flow in the distal vertebral arteries. Vertebral artery
irregularity which may be exacerbated by motion artifact. The
vertebrobasilar junction appears patent, but there is tandem
high-grade stenosis and/or intermittent segmental occlusion of the
basilar artery (series 352, image 8. The distal 3rd of the basilar
appears most occluded, although the basilar tip is reconstituted by
the right posterior communicating artery. There is a fetal type left
PCA origin. The SCA origins are patent. The bilateral PCA branches
are patent without stenosis.

There is no flow signal identified within the left thalamic
hemorrhage.

Antegrade flow in both ICA siphons which are mildly dolichoectatic.
No siphon stenosis. Ophthalmic and right posterior communicating
artery origins are normal. Patent carotid termini. Normal MCA and
ACA origins. The anterior communicating artery and visible ACA
branches are within normal limits. The left MCA M1 segment
bifurcates early. The left MCA branches appear within normal limits,
no stenosis.

There is mild to moderate irregularity and stenosis of the right MCA
M1 segment. Prominent right until a striate branch arises from this
area. The right MCA bifurcation remains patent. The right MCA
branches are patent with up to mild irregularity.
IMPRESSION: 1. MRA is positive for tandem critical stenoses and occlusion of the
distal Basilar Artery, but the basilar tip is reconstituted by the
right posterior communicating artery.
2. Left thalamic hemorrhage is stable from the CT earlier today with
no ventricular or extra-axial extension. Mild surrounding edema and
regional mass effect.
3. Underlying advanced chronic small vessel disease in the deep gray
matter and cerebral white matter, with several other chronic micro
hemorrhages in the left hemisphere.
4. Anterior circulation atherosclerosis primarily at the right MCA
M1 segment where there is up to moderate stenosis.

## 2019-08-12 IMAGING — CT CT HEAD CODE STROKE
3 series · 14 of 47 positions shown, 16 images · non-contrast
Comparison: None.

CLINICAL DATA: Code stroke. 76-year-old male with right side
weakness since 0210 hours.

EXAM:
CT HEAD WITHOUT CONTRAST
TECHNIQUE: Contiguous axial images were obtained from the base of the skull
through the vertex without intravenous contrast.

[Series 2: head code stroke wo · axial · 0.45mm/px · z∈[+42,+167]mm · 8 of 31 slices shown, 10 images]
[im 3/31  brain]
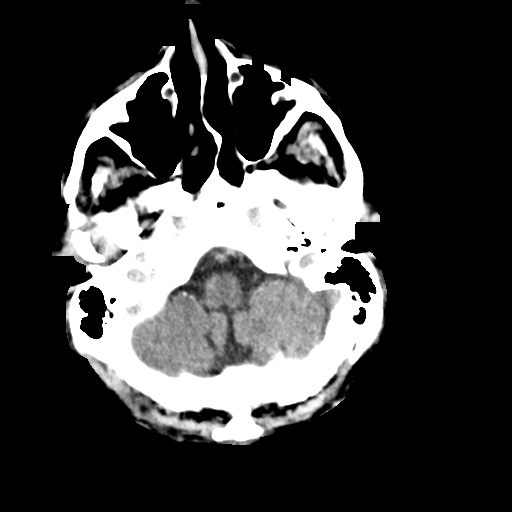
[im 3/31  bone]
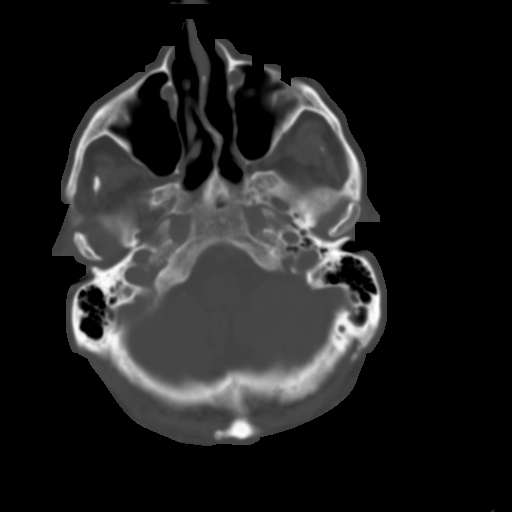
[im 7/31  brain]
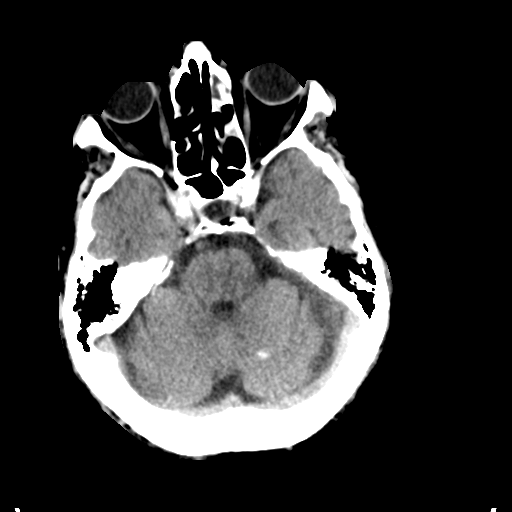
[im 10/31  brain]
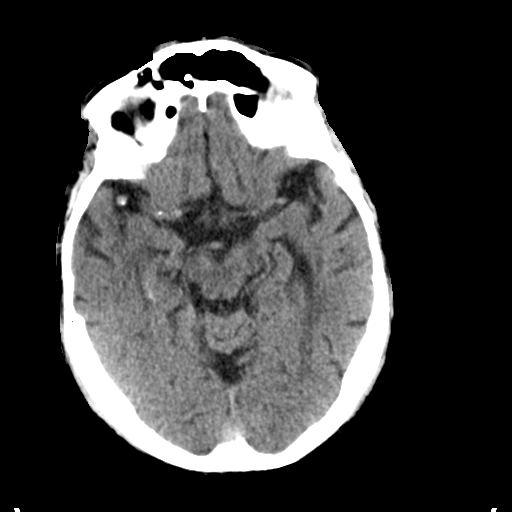
[im 14/31  brain]
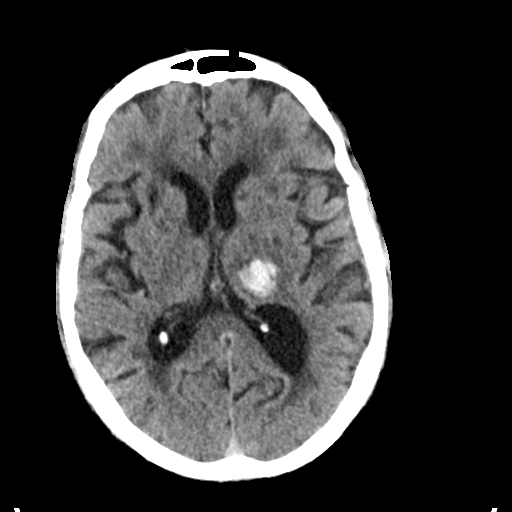
[im 17/31  brain]
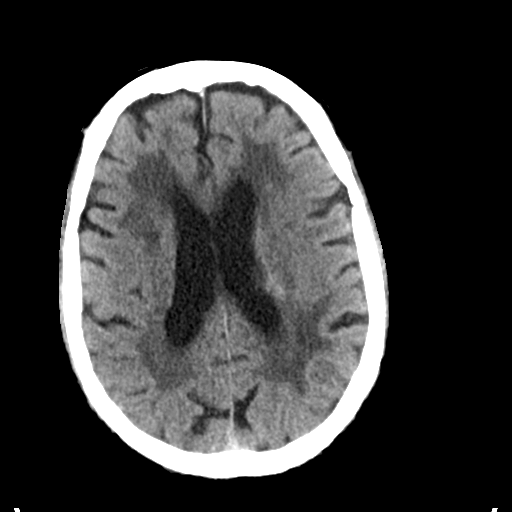
[im 17/31  bone]
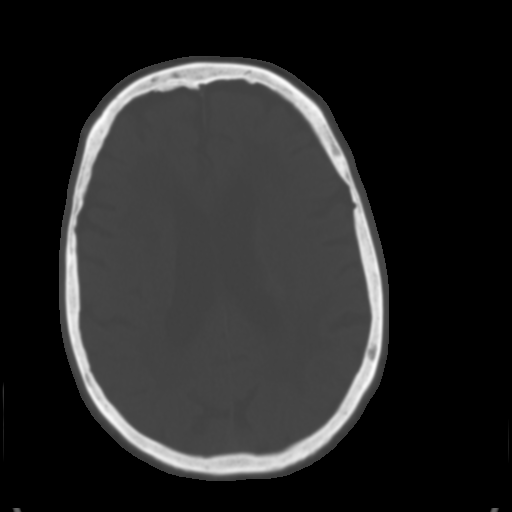
[im 21/31  brain]
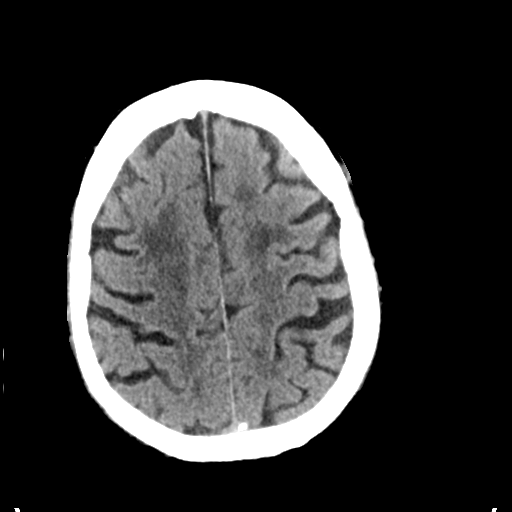
[im 24/31  brain]
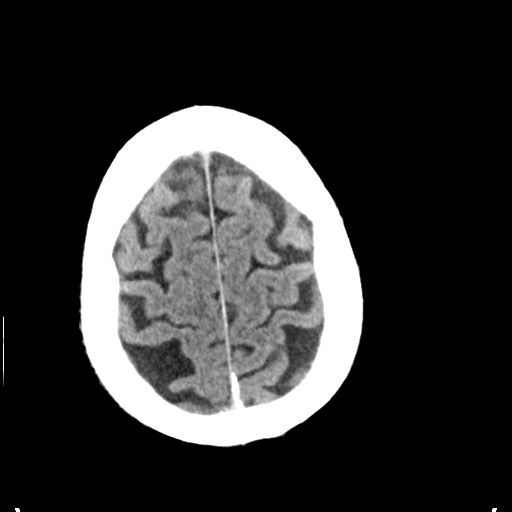
[im 28/31  brain]
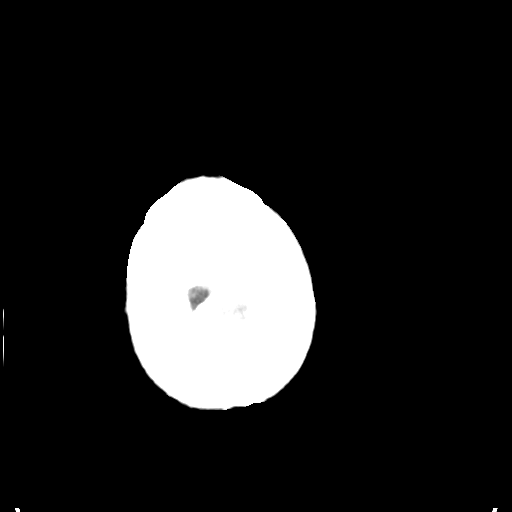

[Series 4: coronal soft tissue · coronal · 0.31mm/px · 3 of 72 slices shown]
[im 24/72  brain]
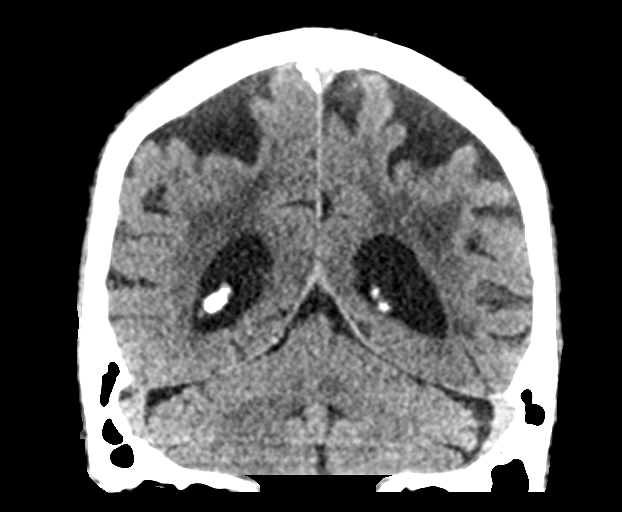
[im 32/72  brain]
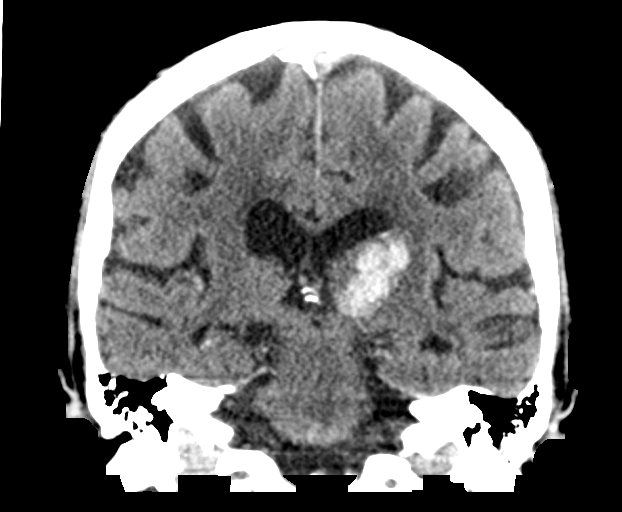
[im 40/72  brain]
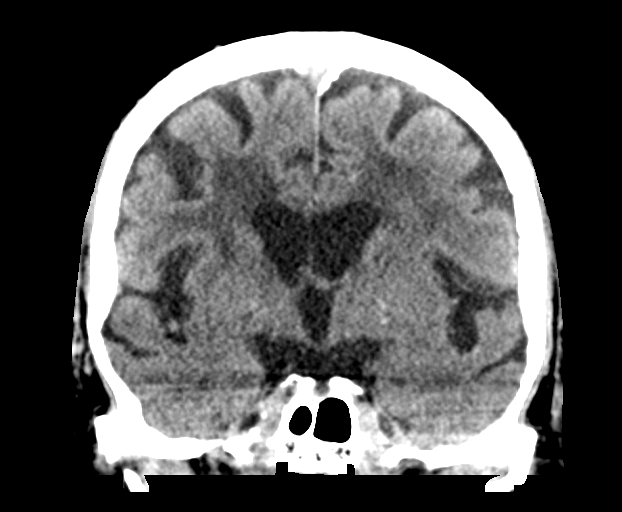

[Series 5: sagittal soft tissue · sagittal · 0.29mm/px · 3 of 65 slices shown]
[im 22/65  brain]
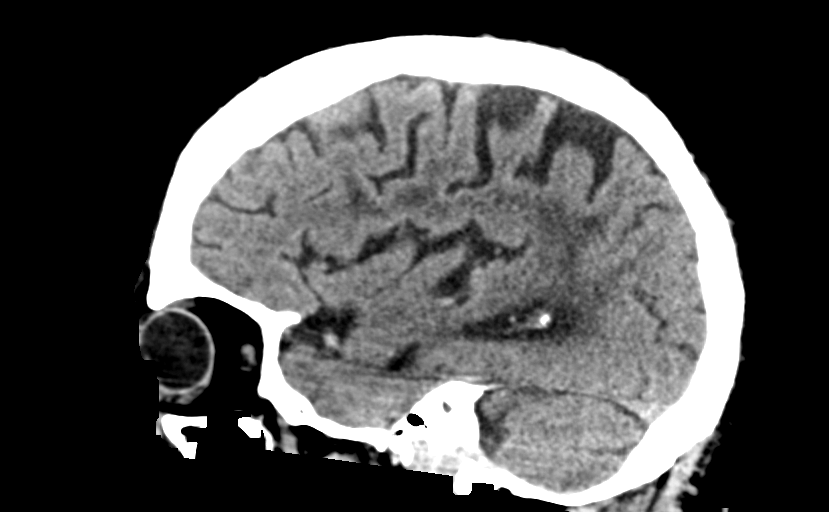
[im 33/65  brain]
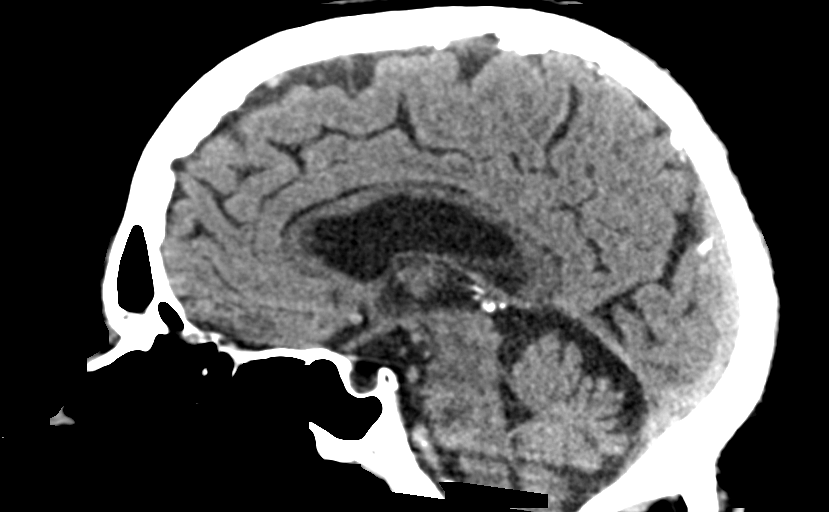
[im 43/65  brain]
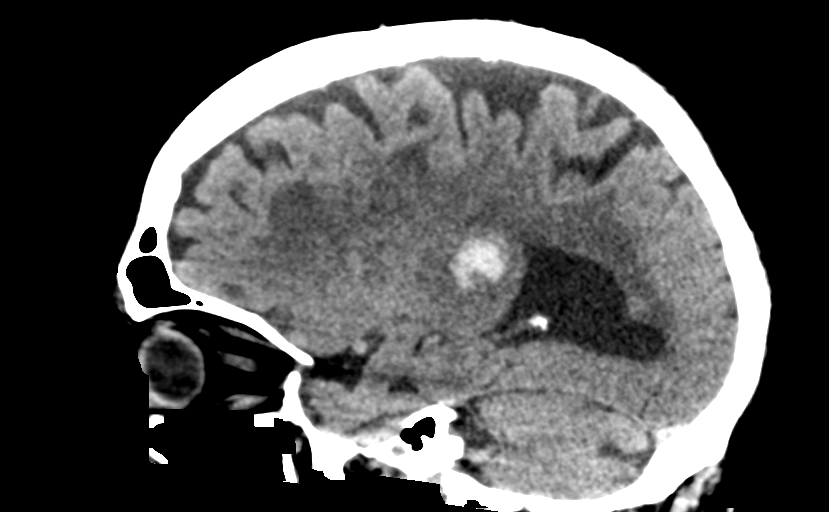

[14 of 47 positions shown; findings below may reference images not displayed]

FINDINGS: Brain: Oval hyperdense hemorrhage occupying the left thalamus
encompasses 20 x 26 x 29 millimeters (AP by transverse by CC) for an
estimated intra-axial blood volume of 8 milliliters. There is mild
surrounding edema and regional mass effect. No intraventricular or
extra-axial extension.

Elsewhere there are small dystrophic calcifications in the basal
ganglia, and a coarse dystrophic calcification in the left
cerebellar hemisphere which appears to be associated with a small
chronic left cerebellar infarct. No other intracranial hemorrhage
identified.

Confluent and widespread bilateral cerebral white matter
hypodensity. Bilateral deep gray matter heterogeneity is associated.
No cerebral cortical encephalomalacia is identified.

No midline shift, ventriculomegaly, mass effect, or acute cortically
based infarct.

Vascular: Extensive intracranial calcified atherosclerosis with
prominent involvement of the right MCA. No suspicious intracranial
vascular hyperdensity.

Skull: Negative.

Sinuses/Orbits: Moderate ethmoid sinus mucosal thickening. Mild
frontal sinus mucosal thickening. Other visible paranasal sinuses
and mastoids are well pneumatized.

Other: Postoperative changes to both globes. Otherwise negative
orbits soft tissues. Visualized scalp soft tissues are within normal
limits.

ASPECTS ([place] Stroke Program Early CT Score)

Not applicable, acute hemorrhage.
IMPRESSION: 1. Oval left thalamic hemorrhage with an estimated blood volume of 8
mL. Mild surrounding edema and regional mass effect. No
intraventricular or extra-axial extension of blood.
2. Underlying advanced chronic small vessel disease, indicating
likely small vessel etiology of #1.
3. Chronic left cerebellar infarct with mild dystrophic
calcification.
4. Extensive intracranial calcified atherosclerosis.

Critical Value/emergent results were called by telephone at the time
of interpretation on 08/25/2017 at [address]444 hours [address] RUN KUHL
, who verbally acknowledged these results.
